# Patient Record
Sex: Female | Born: 1935 | Race: Black or African American | Hispanic: No | Smoking: Never smoker
Health system: Southern US, Community
[De-identification: ages and names within clinical notes are randomized; demographics above are authoritative.]

## PROBLEM LIST (undated history)

## (undated) DIAGNOSIS — L97509 Non-pressure chronic ulcer of other part of unspecified foot with unspecified severity: Secondary | ICD-10-CM

## (undated) DIAGNOSIS — I10 Essential (primary) hypertension: Secondary | ICD-10-CM

## (undated) DIAGNOSIS — M069 Rheumatoid arthritis, unspecified: Secondary | ICD-10-CM

## (undated) DIAGNOSIS — E039 Hypothyroidism, unspecified: Secondary | ICD-10-CM

## (undated) DIAGNOSIS — M810 Age-related osteoporosis without current pathological fracture: Secondary | ICD-10-CM

## (undated) DIAGNOSIS — N189 Chronic kidney disease, unspecified: Principal | ICD-10-CM

## (undated) DIAGNOSIS — R011 Cardiac murmur, unspecified: Secondary | ICD-10-CM

## (undated) DIAGNOSIS — Z9289 Personal history of other medical treatment: Secondary | ICD-10-CM

## (undated) DIAGNOSIS — Z960 Presence of urogenital implants: Secondary | ICD-10-CM

## (undated) DIAGNOSIS — N186 End stage renal disease: Secondary | ICD-10-CM

## (undated) DIAGNOSIS — E119 Type 2 diabetes mellitus without complications: Secondary | ICD-10-CM

## (undated) DIAGNOSIS — R519 Headache, unspecified: Secondary | ICD-10-CM

## (undated) DIAGNOSIS — R51 Headache: Secondary | ICD-10-CM

## (undated) DIAGNOSIS — E11621 Type 2 diabetes mellitus with foot ulcer: Secondary | ICD-10-CM

## (undated) DIAGNOSIS — Z992 Dependence on renal dialysis: Secondary | ICD-10-CM

## (undated) DIAGNOSIS — D631 Anemia in chronic kidney disease: Secondary | ICD-10-CM

## (undated) DIAGNOSIS — Z8701 Personal history of pneumonia (recurrent): Secondary | ICD-10-CM

## (undated) HISTORY — DX: Personal history of pneumonia (recurrent): Z87.01

## (undated) HISTORY — DX: Anemia in chronic kidney disease: D63.1

## (undated) HISTORY — DX: End stage renal disease: Z99.2

## (undated) HISTORY — DX: Type 2 diabetes mellitus without complications: E11.9

## (undated) HISTORY — DX: Essential (primary) hypertension: I10

## (undated) HISTORY — PX: CATARACT EXTRACTION W/ INTRAOCULAR LENS  IMPLANT, BILATERAL: SHX1307

## (undated) HISTORY — DX: Chronic kidney disease, unspecified: N18.9

## (undated) HISTORY — PX: COLONOSCOPY: SHX174

## (undated) HISTORY — DX: Hypothyroidism, unspecified: E03.9

## (undated) HISTORY — DX: End stage renal disease: N18.6

---

## 1973-09-10 DIAGNOSIS — Z9289 Personal history of other medical treatment: Secondary | ICD-10-CM

## 1973-09-10 HISTORY — PX: ORIF FEMUR FRACTURE: SHX2119

## 1973-09-10 HISTORY — DX: Personal history of other medical treatment: Z92.89

## 2003-05-19 ENCOUNTER — Encounter: Payer: Self-pay | Admitting: Internal Medicine

## 2003-05-19 ENCOUNTER — Ambulatory Visit (HOSPITAL_COMMUNITY): Admission: RE | Admit: 2003-05-19 | Discharge: 2003-05-19 | Payer: Self-pay | Admitting: Internal Medicine

## 2003-06-03 ENCOUNTER — Encounter: Payer: Self-pay | Admitting: Internal Medicine

## 2003-06-03 ENCOUNTER — Ambulatory Visit (HOSPITAL_COMMUNITY): Admission: RE | Admit: 2003-06-03 | Discharge: 2003-06-03 | Payer: Self-pay | Admitting: Internal Medicine

## 2003-06-30 ENCOUNTER — Encounter: Payer: Self-pay | Admitting: Internal Medicine

## 2003-06-30 ENCOUNTER — Ambulatory Visit (HOSPITAL_COMMUNITY): Admission: RE | Admit: 2003-06-30 | Discharge: 2003-06-30 | Payer: Self-pay | Admitting: Internal Medicine

## 2003-07-06 ENCOUNTER — Ambulatory Visit (HOSPITAL_COMMUNITY): Admission: RE | Admit: 2003-07-06 | Discharge: 2003-07-06 | Payer: Self-pay | Admitting: Internal Medicine

## 2005-02-06 ENCOUNTER — Ambulatory Visit (HOSPITAL_COMMUNITY): Admission: RE | Admit: 2005-02-06 | Discharge: 2005-02-06 | Payer: Self-pay | Admitting: Nephrology

## 2008-05-03 ENCOUNTER — Ambulatory Visit (HOSPITAL_COMMUNITY): Payer: Self-pay | Admitting: Oncology

## 2008-05-03 ENCOUNTER — Encounter (HOSPITAL_COMMUNITY): Admission: RE | Admit: 2008-05-03 | Discharge: 2008-06-02 | Payer: Self-pay | Admitting: Oncology

## 2008-09-16 ENCOUNTER — Encounter: Payer: Self-pay | Admitting: Internal Medicine

## 2008-10-04 ENCOUNTER — Encounter (HOSPITAL_COMMUNITY): Admission: RE | Admit: 2008-10-04 | Discharge: 2009-01-02 | Payer: Self-pay | Admitting: Nephrology

## 2008-10-20 ENCOUNTER — Encounter: Admission: RE | Admit: 2008-10-20 | Discharge: 2008-10-20 | Payer: Self-pay | Admitting: Nephrology

## 2008-11-13 ENCOUNTER — Ambulatory Visit: Payer: Self-pay | Admitting: Cardiology

## 2008-11-14 ENCOUNTER — Inpatient Hospital Stay (HOSPITAL_COMMUNITY): Admission: EM | Admit: 2008-11-14 | Discharge: 2008-11-20 | Payer: Self-pay | Admitting: Emergency Medicine

## 2008-11-15 ENCOUNTER — Ambulatory Visit: Payer: Self-pay | Admitting: Internal Medicine

## 2008-11-16 ENCOUNTER — Encounter: Payer: Self-pay | Admitting: Cardiology

## 2008-11-17 ENCOUNTER — Ambulatory Visit: Payer: Self-pay | Admitting: Internal Medicine

## 2008-11-24 ENCOUNTER — Encounter: Payer: Self-pay | Admitting: Internal Medicine

## 2008-11-29 ENCOUNTER — Ambulatory Visit: Payer: Self-pay | Admitting: Physician Assistant

## 2008-12-30 ENCOUNTER — Ambulatory Visit: Payer: Self-pay | Admitting: Cardiology

## 2008-12-31 ENCOUNTER — Encounter (INDEPENDENT_AMBULATORY_CARE_PROVIDER_SITE_OTHER): Payer: Self-pay | Admitting: *Deleted

## 2008-12-31 LAB — CONVERTED CEMR LAB
CO2: 22 meq/L
Chloride: 105 meq/L
HDL: 30 mg/dL
LDL Cholesterol: 116 mg/dL
Sodium: 140 meq/L
Triglycerides: 112 mg/dL

## 2009-01-10 ENCOUNTER — Encounter (HOSPITAL_COMMUNITY): Admission: RE | Admit: 2009-01-10 | Discharge: 2009-04-10 | Payer: Self-pay | Admitting: Nephrology

## 2009-02-11 ENCOUNTER — Encounter: Payer: Self-pay | Admitting: Cardiology

## 2009-02-11 LAB — CONVERTED CEMR LAB
Albumin: 4 g/dL (ref 3.5–5.2)
CO2: 18 meq/L — ABNORMAL LOW (ref 19–32)
Calcium: 8.3 mg/dL — ABNORMAL LOW (ref 8.4–10.5)
Chloride: 114 meq/L — ABNORMAL HIGH (ref 96–112)
Glucose, Bld: 77 mg/dL (ref 70–99)
Potassium: 5.3 meq/L (ref 3.5–5.3)
Total Bilirubin: 0.4 mg/dL (ref 0.3–1.2)
Total Protein: 7.6 g/dL (ref 6.0–8.3)

## 2009-03-11 DIAGNOSIS — E119 Type 2 diabetes mellitus without complications: Secondary | ICD-10-CM | POA: Insufficient documentation

## 2009-03-11 DIAGNOSIS — E663 Overweight: Secondary | ICD-10-CM | POA: Insufficient documentation

## 2009-04-04 ENCOUNTER — Encounter (INDEPENDENT_AMBULATORY_CARE_PROVIDER_SITE_OTHER): Payer: Self-pay

## 2009-04-04 LAB — CONVERTED CEMR LAB
HCT: 30.8 %
Hemoglobin: 10 g/dL
Platelets: 149 10*3/uL
WBC: 9.6 10*3/uL

## 2009-04-12 ENCOUNTER — Encounter (INDEPENDENT_AMBULATORY_CARE_PROVIDER_SITE_OTHER): Payer: Self-pay

## 2009-04-12 LAB — CONVERTED CEMR LAB: TIBC: 183 ug/dL

## 2009-04-26 ENCOUNTER — Encounter (HOSPITAL_COMMUNITY): Admission: RE | Admit: 2009-04-26 | Discharge: 2009-07-25 | Payer: Self-pay | Admitting: Nephrology

## 2009-05-11 ENCOUNTER — Ambulatory Visit: Payer: Self-pay | Admitting: Cardiology

## 2009-05-11 ENCOUNTER — Ambulatory Visit (HOSPITAL_COMMUNITY): Admission: RE | Admit: 2009-05-11 | Discharge: 2009-05-11 | Payer: Self-pay | Admitting: Family Medicine

## 2009-05-11 DIAGNOSIS — R0989 Other specified symptoms and signs involving the circulatory and respiratory systems: Secondary | ICD-10-CM

## 2009-05-17 ENCOUNTER — Ambulatory Visit (HOSPITAL_COMMUNITY): Admission: RE | Admit: 2009-05-17 | Discharge: 2009-05-17 | Payer: Self-pay | Admitting: Cardiology

## 2009-05-18 ENCOUNTER — Encounter: Payer: Self-pay | Admitting: Cardiology

## 2009-06-16 ENCOUNTER — Ambulatory Visit (HOSPITAL_COMMUNITY): Admission: RE | Admit: 2009-06-16 | Discharge: 2009-06-16 | Payer: Self-pay | Admitting: Orthopedic Surgery

## 2009-07-18 ENCOUNTER — Encounter (HOSPITAL_COMMUNITY): Admission: RE | Admit: 2009-07-18 | Discharge: 2009-07-19 | Payer: Self-pay | Admitting: Nephrology

## 2009-09-26 ENCOUNTER — Encounter (HOSPITAL_COMMUNITY): Admission: RE | Admit: 2009-09-26 | Discharge: 2009-12-25 | Payer: Self-pay | Admitting: Nephrology

## 2009-11-11 ENCOUNTER — Encounter (INDEPENDENT_AMBULATORY_CARE_PROVIDER_SITE_OTHER): Payer: Self-pay

## 2009-11-26 LAB — CONVERTED CEMR LAB
Calcium: 8.8 mg/dL
Chloride: 105 meq/L
Creatinine, Ser: 2.14 mg/dL
Potassium: 5.5 meq/L

## 2009-12-20 ENCOUNTER — Encounter (INDEPENDENT_AMBULATORY_CARE_PROVIDER_SITE_OTHER): Payer: Self-pay | Admitting: *Deleted

## 2009-12-21 ENCOUNTER — Ambulatory Visit: Payer: Self-pay | Admitting: Cardiology

## 2010-01-30 ENCOUNTER — Encounter: Payer: Self-pay | Admitting: Internal Medicine

## 2010-02-14 ENCOUNTER — Encounter: Payer: Self-pay | Admitting: Internal Medicine

## 2010-03-02 ENCOUNTER — Encounter: Payer: Self-pay | Admitting: Internal Medicine

## 2010-03-16 ENCOUNTER — Encounter (INDEPENDENT_AMBULATORY_CARE_PROVIDER_SITE_OTHER): Payer: Self-pay | Admitting: *Deleted

## 2010-03-16 ENCOUNTER — Encounter: Payer: Self-pay | Admitting: Internal Medicine

## 2010-03-16 LAB — CONVERTED CEMR LAB: Iron: 222 ug/dL

## 2010-03-27 ENCOUNTER — Ambulatory Visit: Payer: Self-pay | Admitting: Internal Medicine

## 2010-05-23 ENCOUNTER — Ambulatory Visit (HOSPITAL_COMMUNITY): Admission: RE | Admit: 2010-05-23 | Discharge: 2010-05-23 | Payer: Self-pay | Admitting: Family Medicine

## 2010-07-31 ENCOUNTER — Encounter: Payer: Self-pay | Admitting: Internal Medicine

## 2010-08-25 ENCOUNTER — Ambulatory Visit (HOSPITAL_COMMUNITY)
Admission: RE | Admit: 2010-08-25 | Discharge: 2010-08-25 | Payer: Self-pay | Source: Home / Self Care | Attending: Neurology | Admitting: Neurology

## 2010-09-12 LAB — CONVERTED CEMR LAB
Albumin: 4 g/dL
BUN: 70 mg/dL
CO2: 19 meq/L
Chloride: 108 meq/L
Ferritin: 723 ng/mL
GFR calc non Af Amer: 20 mL/min
Glomerular Filtration Rate, Af Am: 23 mL/min/{1.73_m2}
Glucose, Bld: 90 mg/dL
Sodium: 142 meq/L

## 2010-09-22 ENCOUNTER — Encounter (INDEPENDENT_AMBULATORY_CARE_PROVIDER_SITE_OTHER): Payer: Self-pay | Admitting: *Deleted

## 2010-09-26 ENCOUNTER — Ambulatory Visit
Admission: RE | Admit: 2010-09-26 | Discharge: 2010-09-26 | Payer: Self-pay | Source: Home / Self Care | Attending: Cardiology | Admitting: Cardiology

## 2010-10-10 NOTE — Op Note (Signed)
Summary: EGD: Dr. Jena Gauss: Normal    NAMEAUTYM, [name]                         ACCOUNT NO.:  [id number]   MEDICAL RECORD NO.:  [id number]                   PATIENT TYPE:  AMB   LOCATION:  DAY                                  FACILITY:  [facility]   PHYSICIAN:  R. Roetta Sessions, M.D.              DATE OF BIRTH:  Dec 03, 1931   DATE OF PROCEDURE:  06/30/2003  DATE OF DISCHARGE:                                 OPERATIVE REPORT   PROCEDURE:  Diagnostic esophagogastroduodenoscopy.   INDICATIONS FOR PROCEDURE:  The patient is a 77 year old lady with a history  of chronic anemia and Hemoccult-positive stool.  Prior colonoscopy and small-  bowel follow-through were negative.  EGD is now being done to rule out a  lesion in her upper GI tract.  This approach has been discussed with the  patient at length previously.  The potential risks, benefits, and  alternatives have been reviewed.  Please see my 06/22/2003 H&P for more  information.   PROCEDURE:  O2 saturation, blood pressure, pulses, and respirations were  monitored throughout the entirety of the procedure.  Conscious sedation was  with Versed 3 mg IV, Demerol 50 mg IV in divided doses.  The instrument used  was the Olympus video chip adult gastroscope.   FINDINGS:  Examination of the tubular esophagus revealed no mucosal  abnormalities.  The EG junction was easily traversed.   Stomach:  The gastric cavity was empty and insufflated well with air.  Thorough examination of the gastric mucosa including retroflex view in the  proximal stomach and esophagogastric junction was undertaken.  The patient  was noted to have a J-shaped stomach.  Otherwise, the gastric mucosa  appeared normal.  The pylorus was patent and easily traversed.   Duodenum:  Examination of the bulb and second portion revealed no  abnormalities.   THERAPEUTIC/DIAGNOSTIC MANEUVERS PERFORMED:  None.   The patient tolerated the procedure well and was reactive in  endoscopy.   IMPRESSION:  1. Normal esophagus.  2. J-shaped but otherwise normal-appearing stomach.  3. Normal first and second portions of the duodenum.    RECOMMENDATIONS:  1. Will arrange a Given imaging capsule study down at [facility].  2. Further recommendations to follow.      ___________________________________________                                            Jonathon Bellows, M.D.   RMR/MEDQ  D:  06/30/2003  T:  06/30/2003  Job:  [id number]   cc:   [name], M.D.  [address]  [place]  [place] [address]  Fax: [phone]

## 2010-10-10 NOTE — Assessment & Plan Note (Signed)
Summary: 6 mth f/u per checkout on 05/11/09/tg   Visit Type:  Follow-up Referring Provider:  Renal-Dr. Hyman Hopes Primary Provider:  Dr. Karleen Hampshire   History of Present Illness: [name] returns to the office is scheduled for continued assessment and treatment of diastolic congestive heart failure, hypertension and chronic kidney disease.  Since her last visit, she developed severe pedal edema with skin ulceration prompting referral to a wound Center in [place].  With therapy and increased diuretic, her skin has healed entirely.  She continues to receive an ESA at Dr. Marland Mcalpine office.  Hemoglobin is reportedly close to 12.  Chronic kidney disease has been relatively stable with creatinine close to 2.   She is following a renal diet.  -  Date:  11/26/2009    BG Random: 122    BUN: 44    Creatinine: 2.14    Sodium: 140    Potassium: 5.5    Chloride: 105    CO2 Total: 22    Calcium: 8.8   Current Medications (verified): 1)  Darvocet-N 100 100-650 Mg Tabs (Propoxyphene N-Apap) .... As Needed 2)  Glucotrol 5 Mg Tabs (Glipizide) .... Take 1 Tablet By Mouth Once A Day 3)  Levothroid 100 Mcg Tabs (Levothyroxine Sodium) .... Take 1 Tablet By Mouth Once A Day 4)  Torsemide 20 Mg Tabs (Torsemide) .... Take One To Two  Tablets By Mouth Daily As Needed For Edema 5)  Diprolene Af 0.05 % Crea (Betamethasone Dipropionate Aug) .... Two Times A Day 6)  Procrit 04540 Unit/ml Soln (Epoetin Alfa) .... Subcutaneously Every Two Weeks 7)  Nu-Iron 150 Mg Caps (Polysaccharide Iron Complex) .... Take 1 Cap Daily 8)  Ra Fish Oil 1000 Mg Caps (Omega-3 Fatty Acids) .... Take 1 Cap Daily 9)  Alprazolam 0.5 Mg Tabs (Alprazolam) .... Take 1 Tab Q 6hrs 10)  Amlodipine Besylate 10 Mg Tabs (Amlodipine Besylate) .... Take 1 Tab Daily  Allergies (verified): 1)  ! * Ivp Dye  Past History:  PMH, FH, and Social History reviewed and updated.  Review of Systems       See history of present illness.  Vital  Signs:  Patient profile:   77 year old female Weight:      226 pounds Pulse rate:   76 / minute BP sitting:   132 / 68  (right arm)  Vitals Entered By: Dreama Saa, CNA (December 21, 2009 2:59 PM)  Physical Exam  General:    Overweight; well-developed; no acute distress;    Neck: No JVD; no right carotid bruits;prominent carotid pulsations with left bruit  Lungs: No tachypnea, clear without rales, rhonchi or wheezes; decreased breath sounds at both bases Cardiovascular: normal PMI; normal S1; prominent splitting of the second heart sound; modest systolic ejection murmur.  Abdomen: BS normal; soft and non-tender without masses or organomegaly;  Musculoskeletal: No deformities, no cyanosis or clubbing; surgical scar over the righ foot/ankle   Neurologic: Normal cranial nerves; symmetric strength and tone;  Skin:  Warm, no significant lesions;  Extremities: 1-2+ pitting ankle edema; compression stockings in place     Impression & Recommendations:  Problem # 1:  CAROTID BRUIT, LEFT (ICD-785.9) Carotid ultrasound shows trivial atherosclerosis.  Management of vascular risk factors should be all that is required for this issue.  Problem # 2:  CONGESTIVE HEART FAILURE, UNSPECIFIED (ICD-428.0) She remains compensated on an increased dose of diuretic.  She is concerned about her dry skin, but I emphasized her weight loss and improved lower  extremity edema.  I would continue to encourage her to take her diuretic on a daily basis and simply to modify the dosage based upon the magnitude of edema.  Problem # 3:  DIABETES MELLITUS (ICD-250.00) Patient reports CBGs around 110.  I have no recent hemoglobin A1c levels.  Problem # 4:  RENAL DISEASE, CHRONIC (ICD-593.9) Basically stable with only a slight increase in creatinine over the past year or so.  She is to see Dr. Hyman Hopes for reevaluation in the near future.  I will see this nice woman again in 9 months.  Patient Instructions: 1)  Your  physician recommends that you schedule a follow-up appointment in: 9 months 2)  Your physician has requested that you decrease the amount of potassium in your diet. Please see [facility] handout.

## 2010-10-10 NOTE — Miscellaneous (Signed)
Summary: LABS BMP,LIPID,12/31/2008  Clinical Lists Changes  Observations: Added new observation of CALCIUM: 8.8 mg/dL (37/62/8315 17:61) Added new observation of CREATININE: 2.14 mg/dL (60/73/7106 26:94) Added new observation of BUN: 44 mg/dL (85/46/2703 50:09) Added new observation of BG RANDOM: 122 mg/dL (38/18/2993 71:69) Added new observation of CO2 PLSM/SER: 22 meq/L (12/31/2008 10:51) Added new observation of CL SERUM: 105 meq/L (12/31/2008 10:51) Added new observation of K SERUM: 5.5 meq/L (12/31/2008 10:51) Added new observation of NA: 140 meq/L (12/31/2008 10:51) Added new observation of LDL: 116 mg/dL (67/89/3810 17:51) Added new observation of HDL: 30 mg/dL (02/58/5277 82:42) Added new observation of TRIGLYC TOT: 112 mg/dL (35/36/1443 15:40) Added new observation of CHOLESTEROL: 168 mg/dL (08/67/6195 09:32)

## 2010-10-10 NOTE — Miscellaneous (Signed)
**Note De-Identified [name] Obfuscation** Summary: Torsemide dose change  Clinical Lists Changes  Medications: Changed medication from TORSEMIDE 20 MG TABS (TORSEMIDE) Take one half tablet by mouth daily. to TORSEMIDE 20 MG TABS (TORSEMIDE) Take one to two  tablets by mouth daily as needed for edema    Pt's PCP, Dr. Regino Schultze, increased dose of Torsemide to 20mg  1 to 2 tabs by mouth daily as needed for edema on 11-10-2009.

## 2010-10-10 NOTE — Assessment & Plan Note (Signed)
Summary: WEIGHT LOSS/YF   History of Present Illness Visit Type: Initial Consult Primary GI MD: Stan Head MD [facility] Primary Provider: Dr. Karleen Hampshire Requesting Provider: Elvis Coil, MD Chief Complaint: Unintentional weight loss over the last year pt states she has lost 50 lbs but is not really sure. Pt denies any loss of appetite or and GI sx.  History of Present Illness:   77 yo woman that has been losing weight. Dr. Marland Mcalpine notes indicate weight decrease of about40# from December to May 2011. 250# down to 212#. She admitted to poor appetite she relates to stress of daughters (2) with strokes) and a schizophrenic and terminally ill son that lives with her.She has also been on a diuretic regimen due to severe pedal edemaand Dr. Marvel Plan notes reflect this. She believes loss of fluid from this is main cause of weight loss. She has no specific GI complaints today.   GI Review of Systems    Reports weight loss.   Weight loss of ? pounds   Denies abdominal pain, acid reflux, belching, bloating, chest pain, dysphagia with liquids, dysphagia with solids, heartburn, loss of appetite, nausea, vomiting, vomiting blood, and  weight gain.        Denies anal fissure, black tarry stools, change in bowel habit, constipation, diarrhea, diverticulosis, fecal incontinence, heme positive stool, hemorrhoids, irritable bowel syndrome, jaundice, light color stool, liver problems, rectal bleeding, and  rectal pain.    Current Medications (verified): 1)  Glipizide 2.5 Mg Xr24h-Tab (Glipizide) .... One Tablet By Mouth Once Daily 2)  Levothroid 100 Mcg Tabs (Levothyroxine Sodium) .... Take 1 Tablet By Mouth Once A Day 3)  Torsemide 20 Mg Tabs (Torsemide) .... Take One To Two  Tablets By Mouth Daily As Needed For Edema 4)  Ra Fish Oil 1000 Mg Caps (Omega-3 Fatty Acids) .... Take 1 Cap Daily 5)  Alprazolam 0.5 Mg Tabs (Alprazolam) .... Take 1 Tab Q 6hrs 6)  Amlodipine Besylate 10 Mg Tabs (Amlodipine  Besylate) .... Take 1 Tab Daily  Allergies (verified): 1)  ! * Ivp Dye  Past History:  Past Medical History: Reviewed history from 05/11/2009 and no changes required. CONGESTIVE HEART FAILURE, preserved left ventricular systolic function DIABETES MELLITUS (ICD-250.00) RENAL DISEASE, CHRONIC (ICD-593.9) PULMONARY EDEMA (ICD-514) HYPERTENSION, UNSPECIFIED (ICD-401.9) OVERWEIGHT/OBESITY (ICD-278.02) Anemia  Past Surgical History: Reviewed history from 05/11/2009 and no changes required. Surgery to the eyes ORIF- left leg secondary to trauma in the 1970s   Family History: Reviewed history from 03/11/2009 and no changes required. Family History of CVA or Stroke:  Heart disease Kidney disease  Social History: Reviewed history from 03/11/2009 and no changes required. Retired  - [employer] Divorced  Tobacco Use - No.  Alcohol Use - no Regular Exercise - no Drug Use - no  Review of Systems       All other ROS negative except as per HPI.   Vital Signs:  Patient profile:   77 year old female Height:      66 inches Weight:      211 pounds BMI:     34.18 Pulse rate:   86 / minute Pulse rhythm:   regular BP sitting:   128 / 78  (left arm) Cuff size:   large  Vitals Entered By: Christie Nottingham CMA Duncan Dull) (March 27, 2010 10:07 AM)  Physical Exam  General:  obese.  NAD Eyes:  icteric Mouth:  dentures no lesions Neck:  Supple; no masses or thyromegaly. Lungs:  Clear throughout to auscultation.  Heart:  2/6 RUSB murmur no gallops Abdomen:  obese, soft and non-tender BS+ no masses Extremities:  1+ bilateral LE edema to mid pre-tbal Neurologic:  Alert and  oriented x3 Psych:  Alert and cooperative. Normal mood and affect.   Records from Dr. Dietrich Pates and Hyman Hopes reviewed, labs and xrays and old endoscopies reviewed  Impression & Recommendations:  Problem # 1:  WEIGHT LOSS (ICD-783.21) Assessment New She and I believe this is due to diuresis. Her weight is the ame  now as it was in May. She has had a colonoscopy and egd in 2004. Do not see an indication at this time to redo those. Iron studies are ok.  Patient Instructions: 1)  Please schedule a follow-up appointment as needed.  2)  Copy sent to : Elvis Coil, MD, Karleen Hampshire, MD 3)  The medication list was reviewed and reconciled.  All changed / newly prescribed medications were explained.  A complete medication list was provided to the patient / caregiver.

## 2010-10-10 NOTE — Letter (Signed)
Summary: [employer] PATIENT CHART REQUEST  [employer] PATIENT CHART REQUEST   Imported By: Rexene Alberts 07/31/2010 15:54:46  _____________________________________________________________________  External Attachment:    Type:   Image     Comment:   External Document

## 2010-10-10 NOTE — Op Note (Signed)
Summary: Colonoscopy: Dr. Jena Gauss: Diverticulosis    NAME:  [name], [name]                         ACCOUNT NO.:  [id number]   MEDICAL RECORD NO.:  [id number]                   PATIENT TYPE:  AMB   LOCATION:  DAY                                  FACILITY:  [facility]   PHYSICIAN:  R. Roetta Sessions, M.D.              DATE OF BIRTH:  [birth year]   DATE OF PROCEDURE:  05/19/2003  DATE OF DISCHARGE:                                 OPERATIVE REPORT   PROCEDURE:  Diagnostic colonoscopy.   INDICATION FOR PROCEDURE:  The patient is a 77 year old lady with chronic  anemia.  She has not had any GI bleeding clinically.  In fact, her Hemoccult  status is unknown.  She has had a normal MCV.  She remains anemic with a  hemoglobin and hematocrit of 9.8 and 29.5 today.   This lady is devoid of any GI tract symptoms, no odynophagia, dysphagia,  early satiety, reflux symptoms, nausea, vomiting, abdominal pain, change in  bowel habits, or weight loss.  She has never had her lower GI tract  evaluated.  Colonoscopy is now being done.  This approach has been discussed  with the patient previously.  The potential risks, benefits, and  alternatives have been reviewed, questions answered, and she is agreeable.  Please see the documentation in the medical record with the dictated  consultation of Jan 28, 2003.   PROCEDURE NOTE:  O2 saturation, blood pressure, pulse, and respirations were  monitored throughout the entire procedure.   CONSCIOUS SEDATION:  Versed 3 mg IV, Demerol 50 mg IV in divided doses.   INSTRUMENT USED:  Olympus video chip adult colonoscope.   FINDINGS:  Digital rectal exam revealed no abnormalities.   Endoscopic findings:  Unfortunately, the prep was marginal to poor.   Rectum:  Examination of the rectal mucosa including retroflexed view of the  anal verge revealed no abnormalities.   Colon:  The colonic mucosa was surveyed from the rectosigmoid junction  through the left, transverse,  and right colon, to the area of the  appendiceal orifice, ileocecal valve, and cecum.  These structures were well-  seen and photographed for the record.  The patient's colon was elongated and  tortuous.  It took a number of maneuvers including external abdominal  pressure and changing of the patient's position to reach the cecum.  There  was also quite a bit of liquid stool throughout her colon, which was  somewhat viscous and loaded with seeds.  This caused clogging of the scope  on several occasions requiring scope maintenance during the procedure.  The  patient was noted to have scattered left-sided diverticula.  The remainder  of the colonic mucosa and the cecum appeared normal.  From this level the  scope was slowly withdrawn and all previously-mentioned mucosal surfaces  were again seen, and no additional abnormalities were observed.  The patient  tolerated the procedure well and  was reacted in endoscopy.   IMPRESSION:  1. Normal rectum.  2. Left-sided diverticula.  3. Long, redundant, capacious colon.  4. Marginal to poor prep made the exam more difficult.  5. No gross colonic mucosal abnormalities seen today.   At this point in time the patient has a chronic anemia.  She has not been  documented to have any evidence of gastrointestinal bleeding as far as I am  aware at this time.  In fact, she has not been documented to be iron-  deficient at this time.   RECOMMENDATIONS:  1. A fasting iron, iron-binding capacity, and ferritin today.  2. Will have her return three Hemoccult cards next week.   Will make further recommendations after the above-mentioned studies are  available for review.                                               Jonathon Bellows, M.D.    RMR/MEDQ  D:  05/19/2003  T:  05/19/2003  Job:  [id number]   cc:   Corrie Mckusick, M.D.  [address] [place] [place]  [place]  [place] [address]  Fax: [phone]

## 2010-10-10 NOTE — Letter (Signed)
Summary: [facility]  [facility]   Imported By: Lennie Odor 03/31/2010 14:44:22  _____________________________________________________________________  External Attachment:    Type:   Image     Comment:   External Document

## 2010-10-12 NOTE — Assessment & Plan Note (Signed)
Summary: F9M   Visit Type:  Follow-up Referring Provider:  Elvis Coil, MD Primary Provider:  Dr. Karleen Hampshire   History of Present Illness: [name] returns to the office for continued assessment and treatment of a history of congestive heart failure with preserved left ventricular systolic function, hypertension and chronic kidney disease-stage IV.  Since her last visit, she has done quite well.  She feels fine from day to day with a sedentary lifestyle, but no dyspnea nor chest discomfort when walking with a cane.  She is receiving q.2 week injections of Procrit, apparently with benefit.  Blood pressure control has been good as far as she knows and renal function stable.  Current Medications (verified): 1)  Glipizide 2.5 Mg Xr24h-Tab (Glipizide) .... One Tablet By Mouth Once Daily 2)  Levothroid 100 Mcg Tabs (Levothyroxine Sodium) .... Take 1 Tablet By Mouth Once A Day 3)  Torsemide 20 Mg Tabs (Torsemide) .... Take One To Two  Tablets By Mouth Daily As Needed For Edema 4)  Ra Fish Oil 1000 Mg Caps (Omega-3 Fatty Acids) .... Take 1 Cap Daily 5)  Alprazolam 0.5 Mg Tabs (Alprazolam) .... Take 1 Tab Q 6hrs 6)  Amlodipine Besylate 10 Mg Tabs (Amlodipine Besylate) .... Take 1 Tab Daily 7)  Calcitriol 0.25 Mcg Caps (Calcitriol) .... Take 1 Cap M,wed,friday  Allergies: 1)  ! * Ivp Dye  Comments:  Nurse/Medical Assistant: patient uses [facility] brought meds   Past History:  PMH, FH, and Social History reviewed and updated.  Past Medical History: CONGESTIVE HEART FAILURE, preserved left ventricular systolic function DIABETES MELLITUS RENAL DISEASE, CHRONIC-stage IV; creatinine of 2.1 in 2011 HYPERTENSION OVERWEIGHT/OBESITY (ICD-278.02) Anemia-ESA therapy  Past Surgical History: Ophthalmologic surgery ORIF- left leg secondary to trauma in the 1970s   Review of Systems  The patient denies chest pain, syncope, dyspnea on exertion, and peripheral edema.     Vital Signs:  Patient profile:   77 year old female Weight:      229 pounds BMI:     37.10 O2 Sat:      97 % on Room air Pulse rate:   71 / minute BP sitting:   146 / 65  (left arm)  Vitals Entered By: Dreama Saa, CNA (September 26, 2010 2:28 PM)  O2 Flow:  Room air  Physical Exam  General:    Overweight; well-developed; no acute distress;    Neck: No JVD Lungs: No tachypnea, clear without rales, rhonchi or wheezes; decreased breath sounds at both bases Cardiovascular: normal PMI; normal S1; prominent splitting of the second heart sound; minimal systolic ejection murmur.  Abdomen: BS normal; soft and non-tender without masses or organomegaly;  Musculoskeletal: No deformities, no cyanosis or clubbing; surgical scar over the righ foot/ankle   Neurologic: Normal cranial nerves; symmetric strength and tone;  Skin:  Warm, no significant lesions;  Extremities: 1-2+ pitting ankle edema; compression stockings in place     Impression & Recommendations:  Problem # 1:  CONGESTIVE HEART FAILURE, UNSPECIFIED (ICD-428.0) Control of CHF is good with diuretics alone.  She has gained 18 pounds, but is asymptomatic without findings for CHF on exam.  We will take today's value as her new dry weight.  Medications include 20 mg of torsemide per day unless she feels bloated when she takes 40 mg.  I explained to her that a 3 pound increase in weight or increase in pedal edema would be a better marker for taking the higher dose of diuretic.  Problem #  2:  RENAL DISEASE, CHRONIC (ICD-593.9) We will seek recent laboratory values from [facility].  Problem # 3:  HYPERTENSION (ICD-401.9) Blood pressure control is fairly good.  Her nephrologist monitors blood pressure q.2 weeks and can modify her antihypertensive regimen as necessary.  BP today: 146/65 Prior BP: 128/78 (03/27/2010)  Labs Reviewed: K+: 5.5 (11/26/2009) Creat: : 2.14 (11/26/2009)   Patient Instructions: 1)  Your physician  recommends that you weigh, daily, at the same time every day, and in the same amount of clothing.  Please record your daily weights on the handout provided and bring it to your next appointment. 2)   Please weigh yourself today (this will be your dry weight) if you gain 3 pounds take 2 tablets of Furosemide until weight is less than dry weight 3)  Your physician recommends that you schedule a follow-up appointment in: 1 month with nurse and in 9 months

## 2010-10-12 NOTE — Miscellaneous (Signed)
Summary: labs 03/16/2010 iron   Clinical Lists Changes  Observations: Added new observation of FERRITIN: 810 ng/mL (03/16/2010 11:43) Added new observation of IRON SATUR %: 47 % (03/16/2010 11:43) Added new observation of UIBC: 117 mcg/dL (46/96/2952 84:13) Added new observation of IRON: 222 mcg/dL (24/40/1027 25:36)

## 2010-10-25 ENCOUNTER — Encounter (INDEPENDENT_AMBULATORY_CARE_PROVIDER_SITE_OTHER): Payer: Self-pay | Admitting: *Deleted

## 2010-10-25 LAB — CONVERTED CEMR LAB
BUN: 79 mg/dL
Chloride: 107 meq/L
Creatinine, Ser: 2.03 mg/dL
Glucose, Bld: 140 mg/dL
Iron: 88 ug/dL
Potassium: 4.7 meq/L
TIBC: 209 ug/dL

## 2010-10-26 ENCOUNTER — Ambulatory Visit (INDEPENDENT_AMBULATORY_CARE_PROVIDER_SITE_OTHER): Payer: Medicaid Other

## 2010-10-26 ENCOUNTER — Encounter (INDEPENDENT_AMBULATORY_CARE_PROVIDER_SITE_OTHER): Payer: Self-pay | Admitting: *Deleted

## 2010-10-26 ENCOUNTER — Encounter: Payer: Self-pay | Admitting: Cardiology

## 2010-10-26 DIAGNOSIS — I10 Essential (primary) hypertension: Secondary | ICD-10-CM

## 2010-10-26 DIAGNOSIS — I509 Heart failure, unspecified: Secondary | ICD-10-CM

## 2010-11-01 NOTE — Assessment & Plan Note (Signed)
Summary: NURSE VISIT WEIGHT CHECK PER PT CHECK OUT 1.17/TMJ/JML   Visit Type:  1 month nurse visit Referring Provider:  Maurine Simmering, MD Primary Provider:  Dr. Karleen Hampshire   History of Present Illness: S: 1 month follow up B: office visit on 09/26/10, daily wts with furosemide as needed for wt > 229 A: pt denies c/o,  wt 229 lbs  no gain since last ov, wt diary returned R: pt requested wt diary be returned to continue daily wts., asked pt to continue her current med regimen and wt management plan  10/26/10  I see no data from [facility] Appears to be doing well-continue current Rx.  Elgin Bing, M.D.   Current Medications (verified): 1)  Glipizide 2.5 Mg Xr24h-Tab (Glipizide) .... One Tablet By Mouth Once Daily 2)  Levothroid 100 Mcg Tabs (Levothyroxine Sodium) .... Take 1 Tablet By Mouth Once A Day 3)  Torsemide 20 Mg Tabs (Torsemide) .... Take One To Two  Tablets By Mouth Daily As Needed For Edema 4)  Ra Fish Oil 1000 Mg Caps (Omega-3 Fatty Acids) .... Take 1 Cap Daily 5)  Alprazolam 0.5 Mg Tabs (Alprazolam) .... Take 1 Tab Q 6hrs 6)  Amlodipine Besylate 10 Mg Tabs (Amlodipine Besylate) .... Take 1 Tab Daily 7)  Calcitriol 0.25 Mcg Caps (Calcitriol) .... Take 1 Cap M,wed,friday 8)  Vitamin D3 50000 Unit Caps (Cholecalciferol) .... Take 1 Tablet By Mouth Weekly 9)  Folic Acid 1 Mg Tabs (Folic Acid) .... Take 1 Tablet By Mouth Once A Day  Allergies (verified): 1)  ! * Ivp Dye  Vital Signs:  Patient profile:   77 year old female Height:      66 inches Weight:      229 pounds O2 Sat:      97 % on Room air Pulse rate:   73 / minute BP sitting:   136 / 64  (left arm)  Vitals Entered By: Teressa Lower RN (October 26, 2010 10:04 AM)  O2 Flow:  Room air  Spoke with pt no new changes in med list

## 2010-11-01 NOTE — Letter (Signed)
Summary: BP LOG  BP LOG   Imported By: Faythe Ghee 10/26/2010 10:27:49  _____________________________________________________________________  External Attachment:    Type:   Image     Comment:   External Document

## 2010-11-07 NOTE — Miscellaneous (Signed)
Summary: cmp,fe,tibc,ferritin  Clinical Lists Changes  Observations: Added new observation of ALBUMIN: 3.9 g/dL (16/06/9603 54:09) Added new observation of GFR AA: 27 mL/min/1.66m2 (10/25/2010 14:01) Added new observation of GFR: 23 mL/min (10/25/2010 14:01) Added new observation of CREATININE: 2.03 mg/dL (81/19/1478 29:56) Added new observation of BUN: 79 mg/dL (21/30/8657 84:69) Added new observation of BG RANDOM: 140 mg/dL (62/95/2841 32:44) Added new observation of CO2 PLSM/SER: 107 meq/L (10/25/2010 14:01) Added new observation of CL SERUM: 107 meq/L (10/25/2010 14:01) Added new observation of K SERUM: 4.7 meq/L (10/25/2010 14:01) Added new observation of NA: 140 meq/L (10/25/2010 14:01) Added new observation of FERRITIN: 731 ng/mL (10/25/2010 14:01) Added new observation of IRON SATUR %: 42 % (10/25/2010 14:01) Added new observation of TIBC: 209 mcg/dL (09/12/7251 66:44) Added new observation of UIBC: 121 mcg/dL (03/47/4259 56:38) Added new observation of IRON: 88 mcg/dL (75/64/3329 51:88)

## 2010-12-18 LAB — IRON AND TIBC
Saturation Ratios: 23 % (ref 20–55)
UIBC: 162 ug/dL

## 2010-12-18 LAB — POCT HEMOGLOBIN-HEMACUE: Hemoglobin: 9 g/dL — ABNORMAL LOW (ref 12.0–15.0)

## 2010-12-18 LAB — RENAL FUNCTION PANEL
Albumin: 3.4 g/dL — ABNORMAL LOW (ref 3.5–5.2)
BUN: 38 mg/dL — ABNORMAL HIGH (ref 6–23)
CO2: 22 mEq/L (ref 19–32)
Calcium: 8.6 mg/dL (ref 8.4–10.5)
Chloride: 114 mEq/L — ABNORMAL HIGH (ref 96–112)
Creatinine, Ser: 2.06 mg/dL — ABNORMAL HIGH (ref 0.4–1.2)
GFR calc Af Amer: 30 mL/min — ABNORMAL LOW (ref >60)
GFR calc non Af Amer: 25 mL/min — ABNORMAL LOW (ref >60)
Glucose, Bld: 73 mg/dL (ref 70–99)
Phosphorus: 3.7 mg/dL (ref 2.3–4.6)
Sodium: 144 mEq/L (ref 135–145)

## 2010-12-18 LAB — CBC
Platelets: 133 10*3/uL — ABNORMAL LOW (ref 150–400)
RDW: 19.3 % — ABNORMAL HIGH (ref 11.5–15.5)

## 2010-12-19 LAB — RENAL FUNCTION PANEL
Calcium: 8.9 mg/dL (ref 8.4–10.5)
GFR calc Af Amer: 30 mL/min — ABNORMAL LOW (ref >60)
GFR calc non Af Amer: 25 mL/min — ABNORMAL LOW (ref >60)
Phosphorus: 3.1 mg/dL (ref 2.3–4.6)
Sodium: 142 mEq/L (ref 135–145)

## 2010-12-19 LAB — IRON AND TIBC
Saturation Ratios: 26 % (ref 20–55)
UIBC: 167 ug/dL

## 2010-12-19 LAB — POCT HEMOGLOBIN-HEMACUE: Hemoglobin: 9.8 g/dL — ABNORMAL LOW (ref 12.0–15.0)

## 2010-12-20 LAB — RENAL FUNCTION PANEL
Albumin: 3.4 g/dL — ABNORMAL LOW (ref 3.5–5.2)
CO2: 19 mEq/L (ref 19–32)
Calcium: 9.1 mg/dL (ref 8.4–10.5)
Chloride: 110 mEq/L (ref 96–112)
GFR calc Af Amer: 26 mL/min — ABNORMAL LOW (ref >60)
GFR calc non Af Amer: 22 mL/min — ABNORMAL LOW (ref >60)
Sodium: 142 mEq/L (ref 135–145)

## 2010-12-20 LAB — IRON AND TIBC: Saturation Ratios: 28 % (ref 20–55)

## 2010-12-20 LAB — POCT HEMOGLOBIN-HEMACUE: Hemoglobin: 10.2 g/dL — ABNORMAL LOW (ref 12.0–15.0)

## 2010-12-21 LAB — DIFFERENTIAL
Basophils Absolute: 0 10*3/uL (ref 0.0–0.1)
Basophils Absolute: 0 10*3/uL (ref 0.0–0.1)
Basophils Absolute: 0 10*3/uL (ref 0.0–0.1)
Basophils Relative: 0 % (ref 0–1)
Basophils Relative: 0 % (ref 0–1)
Basophils Relative: 0 % (ref 0–1)
Eosinophils Absolute: 0 10*3/uL (ref 0.0–0.7)
Eosinophils Relative: 0 % (ref 0–5)
Eosinophils Relative: 2 % (ref 0–5)
Lymphocytes Relative: 7 % — ABNORMAL LOW (ref 12–46)
Lymphocytes Relative: 8 % — ABNORMAL LOW (ref 12–46)
Lymphocytes Relative: 9 % — ABNORMAL LOW (ref 12–46)
Lymphs Abs: 0.6 10*3/uL — ABNORMAL LOW (ref 0.7–4.0)
Lymphs Abs: 0.6 10*3/uL — ABNORMAL LOW (ref 0.7–4.0)
Monocytes Absolute: 0.3 10*3/uL (ref 0.1–1.0)
Monocytes Absolute: 0.6 10*3/uL (ref 0.1–1.0)
Monocytes Relative: 3 % (ref 3–12)
Monocytes Relative: 7 % (ref 3–12)
Monocytes Relative: 8 % (ref 3–12)
Neutro Abs: 5.6 10*3/uL (ref 1.7–7.7)
Neutro Abs: 6.9 10*3/uL (ref 1.7–7.7)
Neutro Abs: 7.1 10*3/uL (ref 1.7–7.7)
Neutrophils Relative %: 82 % — ABNORMAL HIGH (ref 43–77)
Neutrophils Relative %: 84 % — ABNORMAL HIGH (ref 43–77)

## 2010-12-21 LAB — BASIC METABOLIC PANEL
BUN: 51 mg/dL — ABNORMAL HIGH (ref 6–23)
BUN: 65 mg/dL — ABNORMAL HIGH (ref 6–23)
BUN: 87 mg/dL — ABNORMAL HIGH (ref 6–23)
BUN: 90 mg/dL — ABNORMAL HIGH (ref 6–23)
CO2: 27 mEq/L (ref 19–32)
CO2: 27 mEq/L (ref 19–32)
Calcium: 8.3 mg/dL — ABNORMAL LOW (ref 8.4–10.5)
Calcium: 8.5 mg/dL (ref 8.4–10.5)
Calcium: 8.5 mg/dL (ref 8.4–10.5)
Chloride: 108 mEq/L (ref 96–112)
Chloride: 111 mEq/L (ref 96–112)
Chloride: 114 mEq/L — ABNORMAL HIGH (ref 96–112)
Creatinine, Ser: 2.44 mg/dL — ABNORMAL HIGH (ref 0.4–1.2)
Creatinine, Ser: 2.84 mg/dL — ABNORMAL HIGH (ref 0.4–1.2)
Creatinine, Ser: 2.85 mg/dL — ABNORMAL HIGH (ref 0.4–1.2)
Creatinine, Ser: 2.98 mg/dL — ABNORMAL HIGH (ref 0.4–1.2)
GFR calc non Af Amer: 15 mL/min — ABNORMAL LOW (ref >60)
GFR calc non Af Amer: 15 mL/min — ABNORMAL LOW (ref >60)
GFR calc non Af Amer: 17 mL/min — ABNORMAL LOW (ref >60)
GFR calc non Af Amer: 19 mL/min — ABNORMAL LOW (ref >60)
Glucose, Bld: 107 mg/dL — ABNORMAL HIGH (ref 70–99)
Glucose, Bld: 114 mg/dL — ABNORMAL HIGH (ref 70–99)
Glucose, Bld: 174 mg/dL — ABNORMAL HIGH (ref 70–99)
Glucose, Bld: 52 mg/dL — ABNORMAL LOW (ref 70–99)
Glucose, Bld: 68 mg/dL — ABNORMAL LOW (ref 70–99)
Potassium: 4 mEq/L (ref 3.5–5.1)
Potassium: 4.3 mEq/L (ref 3.5–5.1)
Potassium: 4.6 mEq/L (ref 3.5–5.1)
Sodium: 139 mEq/L (ref 135–145)
Sodium: 141 mEq/L (ref 135–145)
Sodium: 142 mEq/L (ref 135–145)
Sodium: 142 mEq/L (ref 135–145)

## 2010-12-21 LAB — GLUCOSE, CAPILLARY
Glucose-Capillary: 123 mg/dL — ABNORMAL HIGH (ref 70–99)
Glucose-Capillary: 132 mg/dL — ABNORMAL HIGH (ref 70–99)
Glucose-Capillary: 136 mg/dL — ABNORMAL HIGH (ref 70–99)
Glucose-Capillary: 138 mg/dL — ABNORMAL HIGH (ref 70–99)
Glucose-Capillary: 140 mg/dL — ABNORMAL HIGH (ref 70–99)
Glucose-Capillary: 152 mg/dL — ABNORMAL HIGH (ref 70–99)
Glucose-Capillary: 173 mg/dL — ABNORMAL HIGH (ref 70–99)
Glucose-Capillary: 185 mg/dL — ABNORMAL HIGH (ref 70–99)
Glucose-Capillary: 342 mg/dL — ABNORMAL HIGH (ref 70–99)
Glucose-Capillary: 345 mg/dL — ABNORMAL HIGH (ref 70–99)
Glucose-Capillary: 55 mg/dL — ABNORMAL LOW (ref 70–99)
Glucose-Capillary: 75 mg/dL (ref 70–99)
Glucose-Capillary: 98 mg/dL (ref 70–99)

## 2010-12-21 LAB — PROTIME-INR
INR: 1.2 (ref 0.00–1.49)
Prothrombin Time: 15.3 seconds — ABNORMAL HIGH (ref 11.6–15.2)

## 2010-12-21 LAB — COMPREHENSIVE METABOLIC PANEL
Albumin: 3.4 g/dL — ABNORMAL LOW (ref 3.5–5.2)
Alkaline Phosphatase: 86 U/L (ref 39–117)
BUN: 76 mg/dL — ABNORMAL HIGH (ref 6–23)
GFR calc Af Amer: 19 mL/min — ABNORMAL LOW (ref >60)
Potassium: 4.2 mEq/L (ref 3.5–5.1)
Sodium: 141 mEq/L (ref 135–145)
Total Protein: 7.2 g/dL (ref 6.0–8.3)

## 2010-12-21 LAB — CBC
HCT: 23.2 % — ABNORMAL LOW (ref 36.0–46.0)
HCT: 28.1 % — ABNORMAL LOW (ref 36.0–46.0)
HCT: 29.8 % — ABNORMAL LOW (ref 36.0–46.0)
Hemoglobin: 8.5 g/dL — ABNORMAL LOW (ref 12.0–15.0)
Hemoglobin: 9.2 g/dL — ABNORMAL LOW (ref 12.0–15.0)
Hemoglobin: 9.5 g/dL — ABNORMAL LOW (ref 12.0–15.0)
MCHC: 33.7 g/dL (ref 30.0–36.0)
MCV: 89 fL (ref 78.0–100.0)
MCV: 90.2 fL (ref 78.0–100.0)
Platelets: 123 10*3/uL — ABNORMAL LOW (ref 150–400)
Platelets: 128 10*3/uL — ABNORMAL LOW (ref 150–400)
Platelets: 136 10*3/uL — ABNORMAL LOW (ref 150–400)
Platelets: 136 10*3/uL — ABNORMAL LOW (ref 150–400)
RBC: 3.08 MIL/uL — ABNORMAL LOW (ref 3.87–5.11)
RDW: 17 % — ABNORMAL HIGH (ref 11.5–15.5)
RDW: 17.6 % — ABNORMAL HIGH (ref 11.5–15.5)
RDW: 18.3 % — ABNORMAL HIGH (ref 11.5–15.5)
RDW: 18.6 % — ABNORMAL HIGH (ref 11.5–15.5)
WBC: 7.4 10*3/uL (ref 4.0–10.5)
WBC: 8.3 10*3/uL (ref 4.0–10.5)

## 2010-12-21 LAB — URINALYSIS, MICROSCOPIC ONLY
Bilirubin Urine: NEGATIVE
Ketones, ur: NEGATIVE mg/dL
Nitrite: NEGATIVE
pH: 5 (ref 5.0–8.0)

## 2010-12-21 LAB — HEPATIC FUNCTION PANEL
ALT: 11 U/L (ref 0–35)
Alkaline Phosphatase: 92 U/L (ref 39–117)
Bilirubin, Direct: 0.1 mg/dL (ref 0.0–0.3)
Indirect Bilirubin: 0.5 mg/dL (ref 0.3–0.9)
Total Bilirubin: 0.6 mg/dL (ref 0.3–1.2)
Total Protein: 7.7 g/dL (ref 6.0–8.3)

## 2010-12-21 LAB — IRON AND TIBC
Iron: 17 ug/dL — ABNORMAL LOW (ref 42–135)
Iron: 46 ug/dL (ref 42–135)
Saturation Ratios: 19 % — ABNORMAL LOW (ref 20–55)
TIBC: 224 ug/dL — ABNORMAL LOW (ref 250–470)
TIBC: 245 ug/dL — ABNORMAL LOW (ref 250–470)
UIBC: 199 ug/dL
UIBC: 207 ug/dL

## 2010-12-21 LAB — PHOSPHORUS: Phosphorus: 3.7 mg/dL (ref 2.3–4.6)

## 2010-12-21 LAB — TSH: TSH: 0.396 u[IU]/mL (ref 0.350–4.500)

## 2010-12-21 LAB — CROSSMATCH

## 2010-12-21 LAB — ABO/RH: ABO/RH(D): B POS

## 2010-12-21 LAB — FERRITIN: Ferritin: 288 ng/mL (ref 10–291)

## 2010-12-21 LAB — LIPID PANEL: VLDL: 15 mg/dL (ref 0–40)

## 2010-12-21 LAB — BRAIN NATRIURETIC PEPTIDE
Pro B Natriuretic peptide (BNP): 581 pg/mL — ABNORMAL HIGH (ref 0.0–100.0)
Pro B Natriuretic peptide (BNP): 624 pg/mL — ABNORMAL HIGH (ref 0.0–100.0)
Pro B Natriuretic peptide (BNP): 837 pg/mL — ABNORMAL HIGH (ref 0.0–100.0)
Pro B Natriuretic peptide (BNP): 979 pg/mL — ABNORMAL HIGH (ref 0.0–100.0)

## 2010-12-21 LAB — CARDIAC PANEL(CRET KIN+CKTOT+MB+TROPI)
CK, MB: 3.5 ng/mL (ref 0.3–4.0)
Relative Index: INVALID (ref 0.0–2.5)
Total CK: 70 U/L (ref 7–177)
Troponin I: 0.19 ng/mL — ABNORMAL HIGH (ref 0.00–0.06)

## 2010-12-21 LAB — CK TOTAL AND CKMB (NOT AT ARMC)
Relative Index: INVALID (ref 0.0–2.5)
Total CK: 70 U/L (ref 7–177)

## 2010-12-21 LAB — APTT: aPTT: 32 seconds (ref 24–37)

## 2010-12-21 LAB — HEMOGLOBIN A1C: Mean Plasma Glucose: 100 mg/dL

## 2010-12-21 LAB — FOLATE RBC: RBC Folate: 414 ng/mL (ref 180–600)

## 2011-01-23 NOTE — Group Therapy Note (Signed)
NAMESEVEN, MARENGO               ACCOUNT NO.:  [id number]   MEDICAL RECORD NO.:  [id number]          PATIENT TYPE:  INP   LOCATION:  A330                          FACILITY:  [facility]   PHYSICIAN:  Dorris Singh, DO    DATE OF BIRTH:  04-Jan-1932   DATE OF PROCEDURE:  DATE OF DISCHARGE:                                 PROGRESS NOTE   HISTORY:  The patient is being followed by Cardiology.  She seems to be  improving with diuresing and we will continue her antibiotics.  Also GI  has been following her.  Her hemoglobin has been remaining stable.  They  recommend outpatient followup for her in a few weeks  and they have  signed off the case.  Dr. Kristian Covey has also signed off since her renal  function is stable.  Currently, we are just monitoring her to have her  fluid status improved.  We will check that in the morning.  I have  discussed plan of care with the patient.   PHYSICAL EXAMINATION:  VITAL SIGNS:  Temperature 97.9, pulse 78,  respirations 18, blood pressure 122/52.  GENERAL:  She is well-  developed, well-nourished in no acute distress.  HEART:  Regular rate and rhythm.  LUNGS:  Clear to auscultation bilaterally.  ABDOMEN:  Soft, nontender.  EXTREMITIES:  Positive pulses with pitting edema and positive Foley  present.   LABORATORY DATA:  White count is 8.1, hemoglobin 10.1, hematocrit 29.8,  platelet count of 136, sodium is 141, potassium 4.2, chloride 108, CO2  24, glucose 120, BUN 76 and creatinine 2.94.   ASSESSMENT/PLAN:  1. Pneumonia, this seems to be resolving well.  We will continue with      treatments while she is still inpatient.  2. Congestive heart failure, the patient is diuresing well.  3. Renal failure.  She seems to be at her baseline.  We will continue      to monitor her and anticipate discharge in 1-2 days.      Dorris Singh, DO  Electronically Signed     CB/MEDQ  D:  11/17/2008  T:  11/17/2008  Job:  [id number]

## 2011-01-23 NOTE — Consult Note (Signed)
[name], [name]               ACCOUNT NO.:  [id number]   MEDICAL RECORD NO.:  [id number]          PATIENT TYPE:  INP   LOCATION:  A330                          FACILITY:  [facility]   PHYSICIAN:  Gerrit Friends. Dietrich Pates, MD, FACCDATE OF BIRTH:  [birth year]   DATE OF CONSULTATION:  DATE OF DISCHARGE:                                 CONSULTATION   PRIMARY CARE PHYSICIAN:  Dr. Regino Schultze.   NEPHROLOGIST:  Dr. Hyman Hopes.   REFERRING PHYSICIAN:  Dr. Osvaldo Shipper of [facility] Hospitalist Team  P.   REASON FOR CONSULTATION:  Congestive heart failure.   HISTORY OF PRESENT ILLNESS:  [name] is a 77 year old female with a  history of hypertension, diabetes mellitus and chronic kidney disease  who presented to [facility] yesterday with  complaints of falling at home.  She apparently hit her head and head and  neck CT were both negative for anything acute.  While in the emergency  room she complained of shortness of breath over the last several weeks  and a chest x-ray was notable for congestive heart failure.  She was  admitted for further evaluation and treatment.  She describes a 1 to 2  week history of cough and congestion as well as wheezing.  She was  apparently treated by her primary care physician with antibiotics and  was feeling somewhat better.  However, she ate a high salt meal late  last week (chicken and dumplings) and noted increased pedal edema as  well as shortness of breath after this.  She denies any chest pain or  syncope.  She denies orthopnea.  She says her cough and wheezing also  got somewhat worse after falling yesterday.  In the emergency room her  BNP was elevated.  Today it is 979.  Her creatinine is up to 2.85 and  her hemoglobin is down to 7.5.  She has had a blood transfusion with  packed red blood cells ordered.  She is being followed by nephrology.  We are asked to further evaluate concerning her congestive heart  failure.   PAST MEDICAL  HISTORY:  As outlined above.  In addition:  1. Echocardiogram September 16, 2008, performed at Dr. Marland Mcalpine office in      [place] demonstrating normal LV function, mild LVH, mild      biatrial enlargement, moderate tricuspid regurgitation and moderate      to severe pulmonary hypertension.  2. Diabetes mellitus.  3. Hypertension.  4. Chronic kidney disease.  5. Anemia.  6. Hypothyroidism.  7. Eye surgery.  8. Status post left lower extremity ORIF secondary to fracture in the      1970s.   MEDICATIONS AT HOME:  1. Torsemide 20 mg p.r.n.  2. Poly iron 150 mg daily.  3. Xanax 0.5 mg p.r.n.  4. Allegra 180 mg daily.  5. Norvasc 10 mg daily.  6. HCTZ 25 mg daily.  7. Synthroid 0.125 mg daily except 2 tablets on Sundays.  8. Glipizide 10 mg daily.  9. Actos 30 mg daily.   She notes a recent history of iron infusion and  completed this after 5  weeks last week.   ALLERGIES:  IV DYE.   SOCIAL HISTORY:  The patient lives in [place] with her son.  She  denies tobacco or alcohol abuse.   FAMILY HISTORY:  Significant for CAD.  Her mother died in her 3s of a  myocardial infarction, brother died in his 16s and a sister died in her  67s with myocardial infarctions.   REVIEW OF SYSTEMS:  Please see HPI.  Denies fevers, has had a sore  throat, denies dysuria, denies hematuria, denies bright red blood per  rectum or melena.  Denies dysphagia.  She has had some discoloration of  her bilateral extremities.  The rest of the review of systems are  negative.   PHYSICAL EXAM:  She is a well-nourished, well-developed female in no  distress.  Blood pressure is 118/59, pulse 80, respirations 18,  temperature 97.2, oxygen saturation 98% on 2 liters, weight 126.3 kg.  HEENT:  Normal.  NECK:  With minimal JVD.  LYMPH:  Without lymphadenopathy.  ENDOCRINE:  Without thyromegaly.  CARDIAC:  Normal S1 - S2, question mid systolic click versus split P2,  holosystolic murmur grade 2/6 heard along the  left lower sternal border.  LUNGS:  With bibasilar rales, no wheezes.  SKIN:  Without rash.  ABDOMEN:  Soft, nontender with normoactive bowel sounds, no  organomegaly.  EXTREMITIES:  With 1+ edema bilaterally.  NEUROLOGIC:  She is alert and oriented x3.  Cranial nerves II-XII are  grossly intact.  MUSCULOSKELETAL:  Without joint deformity.  VASCULAR:  Without carotid bruits bilaterally.   Chest x-ray:  Bilateral interstitial opacities with hilar prominence and  cardiomegaly, probable interstitial edema - question atypical pneumonia  - correlate clinically.  EKG:  Sinus rhythm, heart rate of 86, normal  axis, T-wave inversion in V1-V3, interventricular conduction delay,  first degree AV block, PR interval 202 milliseconds.   LABS:  White count 7400, hemoglobin 7.5, hematocrit 23.2, MCV 90,  platelet count 123,000, sodium 41, potassium 4.6, BUN 65, creatinine  2.5, glucose 114.  LFTs okay, albumin 3.6, TSH 0.396, BNP 979; CK-MB  3.5, 4.6, 3.8; troponin-I 0.03, 0.09, 0.19.  Iron 17, TIBC 224, ferritin  288, total cholesterol 130, triglycerides 74, HDL 23 LDL 92.   ASSESSMENT:  1. Acute congestive heart failure (probably diastolic congestive heart      failure) in a 77 year old female with a history of chronic kidney      disease and normocytic anemia.  2. Minimally elevated troponins.  3. Mild thrombocytopenia.  4. Hypertension.  5. Diabetes mellitus.  6. Hypothyroidism.  7. Pulmonary hypertension.   RECOMMENDATIONS:  The patient was also interviewed and by Dr. Dietrich Pates.  The patient presents with a volume overload in the setting of anemia and  chronic kidney disease.  She has had a recent 10 - 15 pounds weight gain  with symptoms of congestive heart failure and pedal edema.  She has  pulmonary edema on her chest x-ray.  She has had no significant diuresis  with initial furosemide administration.  Her echocardiogram in January  of 2010 demonstrated left ventricular hypertrophy  with normal ejection  fraction obtained, also tricuspid regurgitation with moderate pulmonary  hypertension and increased right atrial pressures.  She needs continued  diuresis and we will increase her dose to 80 mg IV three times a day.  This may cause an increase her creatinine which will need close  monitoring.  Her anemia seems be out of proportion to her  creatinine.  She needs erythropoiesis stimulator.  Consideration of Procrit versus  Epogen can be made by nephrology versus her primary service.  Elevated  troponin is likely secondary to her congestive heart failure in the  setting of chronic kidney disease.  No further cardiac workup is planned  at this time.  Her hypertension seems to be controlled.  Her current  medicines will be continued.  She does have risk equivalent of CAD with  her diabetes mellitus and her LDL is 92.  Her HDL is also low at 23.  Consideration will likely need to be given towards placing her on statin  and treatment as well.  Thank you very much for the consultation.  We  will be glad to follow the patient throughout the remainder of this  admission.      Kara Newcomer, PA-C      Gerrit Friends. Dietrich Pates, MD, [facility]  Electronically Signed    SW/MEDQ  D:  11/15/2008  T:  11/15/2008  Job:  [id number]   cc:   [name], M.D.  Fax: [phone]   Garnetta Buddy, M.D.  Fax: [phone]   Osvaldo Shipper, MD

## 2011-01-23 NOTE — Consult Note (Signed)
NAMESHANEL, [name]               ACCOUNT NO.:  [id number]   MEDICAL RECORD NO.:  [id number]          PATIENT TYPE:  INP   LOCATION:  A330                          FACILITY:  [facility]   PHYSICIAN:  Jorja Loa, M.D.DATE OF BIRTH:  June 30, 1932   DATE OF CONSULTATION:  11/13/2008  DATE OF DISCHARGE:                                 CONSULTATION   The patient is admitted under the hospitalist service.  [name] is a  77 year old African American with past medical history of hypertension,  longstanding history of diabetes, and also history of possibly chronic  renal failure being followed by Dr. Hyman Hopes in [place] presently  admitted to the hospital with a history of a fall.  However, when workup  was done, the patient was found to have congestive heart failure.  She  was hospitalized for further workup.  According to the patient, she has  been doing very well and she was trying to help her daughter with a  dialysis patient.  She pulled down.  She complains of some headache and  neck pain.  Otherwise, overall feels good.  At this moment, she does not  know the extent of her renal failure, but according to her and her  daughter, the patient seems to be on iron and getting Epogen.  There is  no __________.  She does not have any nausea or vomiting nor shortness  of breath, dizziness, or lightheaded.   PAST MEDICAL HISTORY:  She has longstanding history of hypertension.  She has history of hypothyroidism, history of diabetes, history of  glaucoma, history of hypercholesterolemia, and she has also history of  chronic renal failure, and also history of anemia.  She is on iron.  She  has also history of CHF.   MEDICATIONS:  Her medications at this moment consist of,  1. Norvasc 10 mg p.o. daily.  2. Aspirin 81 mg p.o. daily.  3. Cipro 500 mg p.o. b.i.d.  4. She is also on Lasix 60 mg IV b.i.d.  5. Glipizide 5 mg p.o. daily.  6. Novolin insulin.  7. Nu-Iron 150 mg p.o. daily.  8.  Xopenex 0.6 mg every 6 hours.  9. Synthroid 125 mcg p.o. daily and other medications at this moment      seems to be p.r.n. medication.   ALLERGIES:  She is allergic to contrast media.   FAMILY HISTORY:  She has a daughter who is on dialysis.  Source not  clear, but possibly diabetes, and she has a brother also with diabetes.   SOCIAL HISTORY:  No history of smoking.  No history of alcohol abuse.  She is retired from a [employer].   REVIEW OF SYSTEMS:  Presently, she is feeling better.  She does not have  any nausea or vomiting.  No shortness of breath, dizziness, or  lightheadedness.  She denies any fevers, chills, sweating.  I feel she  has some pain of her knee.   PHYSICAL EXAMINATION:  VITAL SIGNS:  Her temperature is 98.2, pulse of  89, blood pressure 120/50.  HEENT:  No conjunctival pallor.  No icterus.  CHEST:  Clear to auscultation.  No rales or rhonchi.  No egophony.  HEART:  Regular rate and rhythm. No murmur. No S3.  ABDOMEN:  Obese.  Positive bowel sounds.  Nontender.  EXTREMITIES:  She does not have any edema except sign of possible  chronic venous.   Her white blood cell count is 6.2, hemoglobin 8.5, hematocrit 25.9,  platelet of 136, INR is 1.2.  PT is 15.3.  Sodium 179, potassium 4.4,  BUN is 51, creatinine is 2.41.  Phosphorus is 4.1, albumin is 3.6,  calcium of 8.2.  BNP is 624.  CPK is 66.   ASSESSMENT:  1. Renal failure, seems to be chronic since being followed by      Nephrology probably at this moment, stage III to stage IV.  She had      ultrasound from 2004 which showed bilaterally echogenic kidneys.      The etiology for her renal failure at this moment does not seem to      be clear possibly secondary hypertensive nephrosclerosis.  Since,      she has diabetes, diabetic nephropathy also need to be entertained.  2. Anemia possibly a combination of iron-deficiency anemia and anemia      of chronic disease.  She is on iron and Epogen.  Hemoglobin and       hematocrit seems to be somewhat low.  3. History of hypertension.  She is on Norvasc.  Blood pressure seems      to be controlled very well.  4. History of diabetes.  She is on insulin and hypoglycemic agent.  5. History of proteinuria.  6. History of hypothyroidism.  She is on Synthroid.  7. History of congestive heart failure.  She is on Lasix.  She has      some urine output.  No sign of fluid overload.  8. Possible pneumonia.  She is on antibiotics.  Overall, the patient      seems to be doing reasonably good.  We will continue with present      management, since the patient has chronic renal failure and then      she has a workup __________ any workup except following her renal      function.  If renal function deteriorates probably will make some      arrangements, otherwise will continue and follow her labs.      Jorja Loa, M.D.  Electronically Signed     BB/MEDQ  D:  11/14/2008  T:  11/14/2008  Job:  [id number]

## 2011-01-23 NOTE — Discharge Summary (Signed)
[name], [name]               ACCOUNT NO.:  [id number]   MEDICAL RECORD NO.:  [id number]          PATIENT TYPE:  INP   LOCATION:  A330                          FACILITY:  [facility]   PHYSICIAN:  Dorris Singh, DO    DATE OF BIRTH:  Mar 03, 1932   DATE OF ADMISSION:  11/13/2008  DATE OF DISCHARGE:  03/13/2010LH                               DISCHARGE SUMMARY   ADMISSION DIAGNOSES:  1. Pulmonary edema likely secondary to chronic kidney disease versus      heart disease.  2. History of chronic kidney disease, stage IV.  3. Obesity.  4. Hypertension.  5. Diabetes mellitus.  6. Cardiomegaly and abnormal echocardiogram.   DISCHARGE DIAGNOSES:  1. Pneumonia.  This is improving.  2. Congestive heart failure improving.  3. Chronic kidney disease, stage IV.  4. Diabetes.  5. Hypertension.   PRIMARY CARE PHYSICIAN:  [name], M.D.   TESTING:  1. When she first came in on March 6, she had bilateral view of the      knees which was negative.  2. Right shoulder which was negative.  3. Head CT without contrast, degenerative changes.  No evidence for      acute abnormality of the C-spine.  4. CT of the spine, degenerative changes.  No evidence of acute      abnormality.  5. She had __________ chest on the 6th which shows bilateral      interstitial opacities with hilar prominence, cardiomegaly and      appearance suggested interstitial edema in the setting of CHF with      atypical pneumonia.  Hilar adenopathy may have similar appearance.      Correlate with signs of infection.  6. On March 7 she had a renal ultrasound which showed interval      asymmetric renal parenchymal loss, right worse than left.  No      hydronephrosis.  7. On March 8,  she had a one-view chest which showed more moderate      congestive heart failure, decreased lung volumes with increased      bibasilar atelectasis.  Suspect underlying bilateral pleural      effusions.  8. On March 11, she had an  interval improvement in congestive heart      failure.   HOSPITAL COURSE:  1. To summarize, the patient is a 77 year old African American female      who was admitted with the above diagnoses.  She was admitted to the      service of [facility] where she was started on IV diuretics for      pulmonary edema and echocardiogram was done.  Her results are as      follows.  The EF fraction is 55% and please see full report.  We      also followed her BNP and Dr. Kristian Covey was consulted for chronic      kidney disease.  Even though she sees Dr. Hyman Hopes in [place], we      have requested Dr. Kristian Covey  to be involved and the patient since  the patient is getting Procrit shots as well.  2. Anemia of chronic disease.  She has been getting IV iron as well as      a weekly shot of Procrit and so Dr. Kristian Covey continued this while      she was here.  The patient had fallen. That is why she had multiple      x-rays of her knees, shoulder and head while she has been here.      Physical therapy has also seen her and we have not had any history      of fall.  At this point in time, the patient clinically states that      she feels better and is anxious to go home.  I will go ahead and      have her follow up closely with her doctors, particularly      cardiology which she can see on Monday for any adjustment in her      Lasix therapy.  It is recommended she follow up with Dr. Hyman Hopes as      well.  She has had to be put on diuretics and this will need to be      addressed as well.   DISCHARGE MEDICATIONS:  1. She will go home on torsemide 20 mg daily.  2. Poly iron 150 mg daily.  3. Xanax 0.5 mg as needed.  4. Allegra 180 mg once a day.  5. Norvasc 10 mg once a day.  6. Hydrochlorothiazide 25 mg once a day.  7. Synthroid 25 mcg once a day and 125 mcg two on Sunday.  8. Glipizide 10 mg once a day.  9. The plan is to have her take Lasix 20 mg x3 days on top of the      torsemide and  hydrochlorothiazide which is what she was on on      admission.  Only do diuretics short-term and have her follow up      with cardiology sometime next week preferably.  The patient will      have to call to set up an appointment for Tuesday or Wednesday, as      long as she continues to improve clinically.  Have given the      patient the signs as to when she needs to come back to be      evaluated.  She has stated understanding.   DISCHARGE INSTRUCTIONS:  Increase her activity slowly and to walk with  assistance.  She is to do a renal diet.  She is to call Dr. Marvel Plan  office next week and set up an appointment for same and also Dr. Marland Mcalpine  office as soon as possible as well.  She is to take her medications as  directed and she is to follow up with Dr. Dietrich Pates and Dr. Shelva Majestic.   Her condition is stable and her disposition will be to home.      Dorris Singh, DO  Electronically Signed     CB/MEDQ  D:  11/20/2008  T:  11/20/2008  Job:  [id number]   cc:   [name], M.D.  Fax: [phone]

## 2011-01-23 NOTE — Consult Note (Signed)
NAMECORLIS, ANGELICA               ACCOUNT NO.:  [id number]   MEDICAL RECORD NO.:  [id number]          PATIENT TYPE:  INP   LOCATION:  A330                          FACILITY:  [facility]   PHYSICIAN:  R. Roetta Sessions, M.D. DATE OF BIRTH:  10/18/1931   DATE OF CONSULTATION:  DATE OF DISCHARGE:                                 CONSULTATION   REQUESTING PHYSICIAN:  [facility].   REASON FOR CONSULTATION:  Anemia.   HISTORY OF PRESENT ILLNESS:  Ms. Efaw is a 77 year old African  American female.  She was admitted to [facility] after a fall  and with congestive heart failure and possible pneumonia.  Just last  week she was started on parenteral iron under the direction of Dr. Hyman Hopes  and Dr. Mariel Sleet.  She was supposed to start on what she believes may  be Procrit today.  She was told she would be getting weekly injections.  She had a history of anemia of chronic disease and iron deficiency.  She  was actually evaluated by Dr. Jena Gauss back in 2004, she underwent a  colonoscopy where she had a poor prep.  She was found to have a left-  sided diverticula and a long redundant capacious colon.  She had an EGD  which was normal and a small bowel Given capsule study at [facility]  which was negative.  Her Hemoccult status was unknown, however on exam  today it was negative.  She tells me she has had anemia for about 5  years, she has been taking p.o. iron but has not been taking it on a  regular basis since she felt as though she could not afford it.  She  tells me when she did take her iron her hemoglobin seem to stay in the  normal range as far she can recall.  She denies any rectal bleeding and  she denies any melena.  Her first dose of IV iron was last week.  She is  scheduled to receive 2 units of packed RBCs today as her hemoglobin  dropped to a low of 7.5, it was 8.5 on admission.  Her weight is stable,  it does sometimes steadily increase, she feels this is due to fluid.  She  has had some anorexia.  She denies any nausea, vomiting, dysphagia  or odynophagia.  She has been on a renal diet at home.  She has a  history of chronic constipation, she usually goes up to 3 weeks without  a bowel movement.  She has now been having a bowel movement about twice  a week.  She denies any history of diarrhea, rectal bleeding or melena.  She has never taken laxatives or enemas.  She denies any NSAID use.  She  did have a normal TSH since admission as well.   PAST MEDICAL AND SURGICAL HISTORY:  1. History of previous GI workup including colonoscopy, EGD and      Given's capsule as per HPI.  2. Hypertension.  3. Hypothyroidism.  4. Diabetes mellitus.  5. Glaucoma.  6. Hyperlipidemia.  7. Chronic kidney disease.  8. CHF.  9. Left leg fracture with fixation.   MEDICATIONS PRIOR TO ADMISSION:  1. Torsemide 20 mg p.r.n.  2. Poly iron 150 mg daily.  3. Xanax 0.5 mg p.r.n.  4. Allegra 180 mg daily.  5. Norvasc 10 mg daily.  6. Hydrochlorothiazide 25 mg daily.  7. Synthroid 125 mcg daily, except for 250 mcg on Sunday.  8. Glipizide 10 mg daily.   ALLERGIES:  CONTRAST IV DYE.   FAMILY HISTORY:  There is questionable history about one family member  who may have had colon cancer but she never had a complete workup.  Family history is otherwise significant for a daughter with chronic  kidney disease on dialysis, a son with GERD, multiple children with  diabetes mellitus.  Mother deceased in her 70s with history of CHF.  Father deceased in his 61s with history of CVA.  She has lost three  brothers to CHF and one sister.  She had a total of nine siblings.   SOCIAL HISTORY:  Ms. Roig is divorced.  She lives with her son.  She  has five living children and one deceased in an automobile accident.  She is currently retired from a [employer].  She denies any tobacco,  alcohol or drug use.   REVIEW OF SYSTEMS:  See HPI.  NEURO:  She did have fall prior to  admission to the  hospital.  She did have a head CT on November 13, 2008  which showed degenerative C-spine changes, no evidence of acute  intracranial abnormality, chronic sinusitis, otherwise negative review  of systems.   PHYSICAL EXAM:  VITAL SIGNS:  Temperature of 97.2, pulse 80,  respirations 18, blood pressure 118/59, O2 sat 98% on 2 liters per  minute, weight 126.3 kilograms, height 68 inches.  GENERAL:  Ms. Grieshop is an obese [place] American female who is alert,  oriented, pleasant and cooperative, in no acute distress.  HEENT:  Sclerae clear, nonicteric, conjunctivae pink.  Oropharynx pink  and moist without any lesions.  NECK:  Supple without any mass or thyromegaly.  CHEST:  Heart regular rate and rhythm, normal S1-S2 without no murmurs,  clicks, rubs or gallops.  ABDOMEN:  Protuberant with positive bowel sounds x4, no bruits  auscultated.  Soft, nontender, nondistended without palpable mass or  hepatosplenomegaly.  Exam is extremely limited given the patient's body  habitus.  RECTAL:  No external lesions visualized, good sphincter tone, no  internal masses.  She did have large amount of formed stool in the vault  which was medium brown and Hemoccult negative.  Exam was limited by  this.  EXTREMITIES:  With 1+ pretibial edema bilaterally, there is no clubbing.  SKIN:  She does have an erythematous ulcer to her right posterior thigh,  she tells me this is due to a burn.   LABORATORY STUDIES:  Hemoglobin 7.5, hematocrit 23.2, MCV 90.2, white  blood cell count of 7.4, platelet count 123, INR from yesterday 1.2,  ProTime is 15.3, PTT 32, sodium 141, potassium 4.6, chloride 114, CO2 -  20, glucose 114, BUN 65, creatinine 2.85, calcium 8.9, phosphorus 3.7,  yesterday total bilirubin was 0.6, direct 1.5, indirect was 0.5,  alkaline phosphatase 92, AST 21, ALT 11, total protein 7.7 and albumin  3.6, hemoglobin A1c was 5.1.  BNP was 979 today, she did have two  positive troponins and CK-MB of 4.6.   other cardiac markers were  negative.  Cholesterol is 130.  Her TSH was 0.396.  Her iron was 17.  Her TIBC 224, percent saturation 8.  UIBC 207 and ferritin 298.  Urinalysis showed trace protein and trace leukocytes.   IMPRESSION:  Ms. Chilton is a 77 year old African female with acute on  chronic anemia/iron deficiency anemia.  Her hemoglobin drifted from 8.5  to 7.5, she is now receiving 2 units packed red blood cells today.  She  was admitted with congestive heart failure and possible pneumonia.  She  is on antibiotics and diuretics.  Previous gastroenterology workup in  2004 by Dr. Jena Gauss included a normal EGD and a marginal poor prep  colonoscopy with a long redundant and capacious colon and left-sided  diverticula and a negative small bowel Given's capsule study.  She has  history of chronic constipation.  She has received parenteral iron last  week.  She has a history of chronic kidney disease and followed by Dr.  Hyman Hopes in [place].  Hemoccult negative on exam by me today.  She has  been followed by Dr. Mariel Sleet previously.   I suspect she has chronic iron deficiency anemia with mixed anemia of  chronic disease.  She is possible she could have malabsorption of iron.  There is no evidence of gastrointestinal bleeding at this time.  My  concern is that her last colonoscopy had a marginal to poor prep and  will discuss this further with Dr. Jena Gauss as whether this needs to be  repeated to rule out colorectal carcinoma.   PLAN:  1. I agree with hemocculting stools x3 and we will follow up on this.  2. Would add Colace 100 mg b.i.d.  3. Would add MiraLAX 17 grams to her current fluid intake as we do not      want to increase her fluid intake given her history of kidney      disease and CHF which will need to be monitored closely.  4. Will discuss repeating colonoscopy plus or minus EGD with Dr. Jena Gauss      if she is Hemoccult positive.   We would like to thank the [facility]  for allowing [place] to  participate in the care of Ms. Poynter.  Lorenza Burton, N.P.      Jonathon Bellows, M.D.  Electronically Signed    KJ/MEDQ  D:  11/15/2008  T:  11/15/2008  Job:  [id number]   cc:   Garnetta Buddy, M.D.  Fax: [phone]   Ladona Horns. Mariel Sleet, MD  Fax: [phone]

## 2011-01-23 NOTE — H&P (Signed)
NAMEAMAAL, DIMARTINO               ACCOUNT NO.:  [id number]   MEDICAL RECORD NO.:  [id number]          PATIENT TYPE:  INP   LOCATION:  IC02                          FACILITY:  [facility]   PHYSICIAN:  Osvaldo Shipper, MD     DATE OF BIRTH:  [birth year]   DATE OF ADMISSION:  11/13/2008  DATE OF DISCHARGE:  LH                              HISTORY & PHYSICAL   PRIVATE MEDICAL DOCTOR:  [name], M.D.   NEPHROLOGIST:  Garnetta Buddy, M.D. with [facility].   She has never seen a heart doctor.   ADMITTING DIAGNOSES:  1. Pulmonary edema, likely secondary to chronic kidney disease versus      heart disease.  2. History of chronic kidney disease, likely stage IV.  3. Obesity.  4. Hypertension.  5. Diabetes mellitus.  6. Cardiomegaly and abnormal electrocardiogram.   CHIEF COMPLAINT:  Fall and shortness of breath.   HISTORY OF PRESENT ILLNESS:  The patient is a 77 year old African  American female who was in her usual state of health until about a few  days prior to October 25, 2008, when she started feeling short of  breath and started wheezing and had a cough. She went to her doctor's  office on February 15 and was diagnosed with bronchitis.  She was given  an injection and started on inhaler treatments. She started feeling  better but her cough and wheezing did not go away.  She went to see her  nephrologist on February 23.  She receives Procrit injections every 2  weeks and she has been receiving iron infusion therapy as well for her  anemia.  Today, the patient stumbled in her bedroom and fell.  She is  complaining of some neck pain.  She did not lose consciousness.  Denies  any chest pain.  She has been having shortness of breath as mentioned  earlier.  She has been having a dry cough, wheezing.  She also mentions  leg swelling.  She is unable to tell me if she has orthopnea or PND.  She is unable to give me this history at this time. She does use only  one  pillow on her bed while sleeping and that has not changed recently.  She denies any weight gain, although again she is not very clear about  this.   Her granddaughter was also in the room with the patient.  History was  somewhat inadequate.   MEDICATIONS:  At home:  1. Torsemide 20 mg, she takes it on an as needed basis.  2. Poly-Iron 150 mg daily.  3. Xanax 0.5 mg every 6 hours as needed for anxiety.  4. Actos 30 mg daily.  5. Norvasc 10 mg daily.  6. Hydrochlorothiazide 25 mg daily.  7. Synthroid 125 mcg every day and then 250 mcg on Sunday.  8. Glipizide ER 10 mg daily.  9. She finished 5 doses of iron infusion, she is on Procrit every 2      weeks.  I do not know the dose.   ALLERGIES:  DYE, presuming this is IVP dye.  She is unable to tell me  very clearly.   PAST SURGICAL HISTORY:  Surgery to the eyes, surgery to the left leg  because of fracture.  She has had a colonoscopy done in 2004 which  revealed diverticula, otherwise no other specific abnormalities were  noted.  She also has had an EGD done which was also unremarkable.  These  endoscopies were done to evaluate her anemia.   PAST MEDICAL HISTORY:  1. Allergies.  2. Anemia.  3. Anxiety.  4. Arthritis.  5. Diabetes mellitus.  6. Hypertension.  7. Hypothyroidism.  8. Chronic kidney disease.  No history of stroke, lung disease or heart attacks.   SOCIAL HISTORY:  Lives in [place] with her son. Has poor functional  capacity, uses a cane and walker.  She climbs a few stairs and fears  shortness of breath.  Denies smoking, alcohol or illicit drug use.   FAMILY HISTORY:  Positive for heart disease in her mother's side,  strokes in her father's side.  She also mentions kidney disease in her  father's side.   REVIEW OF SYSTEMS:  GENERAL:  Positive for weakness, malaise.  HEENT:  Unremarkable.  CARDIOVASCULAR:  Unremarkable except as in history of present illness.  RESPIRATORY:  As in HPI.  GI:  Unremarkable.   GU: Unremarkable.  NEUROLOGIC:  Unremarkable.  PSYCHIATRIC:  Unremarkable.  DERMATOLOGIC:  Unremarkable.  MUSCULOSKELETAL:  Positive as in HPI for neck pain.  Other systems unremarkable.   PHYSICAL EXAMINATION:  VITAL SIGNS:  Temperature 97.4, blood pressure  153/71 on admission to the ED and then 130/58 as the last reading.  Heart rate in the 90s and regular.  Respiratory rate 22.  Saturation 96%  in room air.  GENERAL:  This is an obese African American female in no acute distress.  HEENT:  There is no pallor, no icterus.  Oral mucosal membranes moist,  no oral lesions noted.  NECK:  Soft, supple.  There is some tenderness but no restriction to  rupture of membranes.  LUNGS:  Few scattered end expiratory wheezes bilaterally.  Crackles  appreciated at both bases.  CARDIAC:  S1 and S2 normal and regular. S3 is present.  No S4 is  appreciated.  JVD was difficult to appreciate.  No bruits  were heard.  Edema 1+ pitting was noted in the lower extremities.  ABDOMEN:  Obese, nontender, nondistended.  Bowel sounds are present. No  mass or organomegaly is appreciated.  MUSCULOSKELETAL:  Unremarkable.  NEUROLOGIC:  Unremarkable.  GENITOURINARY:  Deferred.   LABORATORY DATA:  White count 6.2, hemoglobin 8.5, MCV 90, platelet  count 136,000.  Glucose 174, BUN 51, creatinine 2.4.  Calcium 8.5.  BNP  581, cardiac markers negative.  Chest x-ray shows evidence of  cardiomegaly and pulmonary edema.  EKG shows sinus rhythm, with normal  axes, evidence of right bundle branch block, mild conduction delay also  noted.  Possible Q wave in lead 1 and aVL.  T-wave changes also noted in  the anterior leads. Few PVCs noted.   ASSESSMENT:  This is a 77 year old African American female who has  diabetes, hypertension, chronic kidney disease who is obese who presents  with a few week history of shortness of breath and actually fell today.  She has evidence of cardiomegaly.  She has EKG abnormalities.  She  has  evidence of pulmonary edema on chest x-ray.  Pulmonary edema is most  likely secondary to a couple of a factors here, one is could be  secondary to the kidney disease and she also could have heart disease  considering her abnormal EKG and cardiomegaly.  She fell because she  stumbled.  All imaging studies done including CT of her C-spine, head  and right shoulder were all negative.   PLAN:  1. Pulmonary edema:  She warrants admission for IV diuretics and      further evaluation.  Echocardiogram will be done and Lasix IV will      be given.  Will hold on the Actos as that can cause congestive      heart failure.  BNP's will be followed.  Will have Dr. Kristian Covey      take a look at her for chronic kidney disease.  Ultrasound of the      kidneys will be checked.  This patient will likely require      evaluation of her coronaries at some point.  2. Chronic kidney disease, stage IV.  Ultrasound will be checked and      will see what Dr. Kristian Covey has to recommend.  Phosphorus level will      be checked as well.  Will obtain records from Dr. Marland Mcalpine office.  3. Anemia of chronic disease.  Iron profile studies will be checked.      Will try to obtain the dose of Procrit that she is supposed to get      this Monday and will give it to her if possible.  Her anemia is not      severe enough to have caused the CHF.  I will hold off on      transfusion for now but it is something to be considered if she      does not improve.  4. Fall.  Because of stumbling, not any other reason.  All imaging      studies have been negative so far.  Pain control will be provided      if needed.  5. Diabetes, type 2.  Hold Actos as it can cause CHF.  Will continue      with glipizide on a lower dose and cover her with sliding scale      insulin as well.  Will check HbA1c as well.  6. Hypertension.  Continue with Norvasc.  7. Deep vein thrombosis with heparin.  8. Mild thrombocytopenia.  This will be monitored  closely.    Further management decisions will depend on the results of further  testing and patient's response to treatment.      Osvaldo Shipper, MD  Electronically Signed     GK/MEDQ  D:  11/14/2008  T:  11/14/2008  Job:  [id number]   cc:   Jorja Loa, M.D.  Fax: [phone]   Garnetta Buddy, M.D.  Fax: [phone]   [name], M.D.  Fax: [phone]   [facility]

## 2011-01-23 NOTE — Letter (Signed)
December 30, 2008    [name], MD  [address]  [place], [place] [address]   RE:  [name], [name]  MRN:  [id number]  /  DOB:  [birth year]   Dear [name],   [name] returns to the office after recent admission to [facility] with congestive heart failure but preserved left ventricular  systolic function.  This occurred in the setting of moderate chronic  kidney disease with baseline creatinine of approximately 2.5 and severe  anemia with an initial hemoglobin of 7.5.  She has felt fine since  hospital discharge.  She is sedentary, but is able to walk short  distances with a cane and cook without difficulty.  She has a Magazine features editor.  She has had no orthopnea or PND.  She had a progressive  increase in creatinine with initial dosing of diuretics.  She was  advised to discontinue hydrochlorothiazide, but did not do so.  She is  very unclear as to how she is taking her medications.  Her diuretic dose  is supposed to be 20 mg of torsemide 5 days per week.  She has been  monitoring weight at home, which has been Farley.   Her other medications include:  1. An iron supplement.  2. Xanax p.r.n.  3. Allegra 180 mg daily.  4. Amlodipine 10 mg daily.  5. Levothyroxine - dose uncertain.  6. Glipizide 10 mg daily.  7. Actos was recently added to her medical regime.  8. She also was given a few pills of Lasix at the time of hospital      discharge for unclear reasons.   PHYSICAL EXAMINATION:  GENERAL:  Very pleasant older woman in no acute  distress.  VITAL SIGNS:  The weight is 239 pounds, Farley.  Blood pressure 135/60,  heart rate 65 and regular, respirations 14 and unlabored.  NECK:  No jugular venous distention; carotid pulsation is visible.  No  carotid bruits.  LUNGS:  Clear.  Mild kyphosis.  CARDIAC:  Normal first heart sound; increased intensity of second heart  sound; grade 2/6 early and midsystolic murmur at the cardiac base.  ABDOMEN:  Soft and nontender; no  organomegaly.  EXTREMITIES:  Edema 1-2+ bilaterally.   IMPRESSION:  [name] is doing generally well.  It is not clear what  diuretic dose she is taking or whether this is more or less than she was  taking before she was admitted with fluid overload.  We will have her  return with all of her medication and try to sort it out for her.  We  will monitor her and renal function closely with a chemistry profile in  6 weeks and a return visit in 4 months.  She is cautioned to call for  any significant weight loss or for recurrent severe edema or dyspnea.   With respect to treatment of diabetes, metformin is obviously not a  consideration.  I would also stay away from Actos in the setting of a  history of congestive heart failure and active edema.  I asked her to  stop this medicine and consult with you about additional drugs that she  might take.  I would consider Prandin or Januvia.    Sincerely,      Gerrit Friends. Dietrich Pates, MD, [facility]  Electronically Signed    RMR/MedQ  DD: 12/30/2008  DT: 12/31/2008  Job #: [id number]

## 2011-01-23 NOTE — Group Therapy Note (Signed)
[name], [name]               ACCOUNT NO.:  [id number]   MEDICAL RECORD NO.:  [id number]          PATIENT TYPE:  INP   LOCATION:  A330                          FACILITY:  [facility]   PHYSICIAN:  Dorris Singh, DO    DATE OF BIRTH:  1931/11/11   DATE OF PROCEDURE:  11/15/2008  DATE OF DISCHARGE:                                 PROGRESS NOTE   The patient seen today, received a call about critical CBC on her.  Her  H and H had gone to 7.5/23.2.  Started to transfuse her today.  She  mentioned a history of having problems with her being anemic in the  past.  She had been seeing a doctor in [place] who had been giving  her shots.   PHYSICAL EXAM:  GENERAL:  She is well developed, well nourished in no  acute distress.  HEART:  Regular rate and rhythm.  LUNGS:  Clear auscultation bilaterally.  ABDOMEN:  Soft, nontender.  EXTREMITIES:  Positive pulses.   Her white count is 7.4.  His hemoglobin prior to transfusion was 7.5 and  hematocrit 23.2, platelet count 123,000.  Her BMP sodium is 141,  potassium 4.6, chloride 114, CO2 20, glucose 114, BUN 65 and creatinine  2.85.  Currently we are seeing a worsening of her creatinine in her BUN  from yesterday.  Also increase in her BNP from yesterday as well.   ASSESSMENT/PLAN:  1. Pulmonary edema.  2. History of chronic kidney disease likely stage IV.  3. Obesity.  4. Hypertension.  5. Diabetes mellitus.  6. Cardiomegaly.  7. Anemia.   PLAN:  Pulmonary edema.  Will check to see if she has a 2-D echo  ordered.  The patient will have a 2-D echo and cardiology consult.  History of kidney disease.  Consult Dr. Kristian Covey and continue to monitor  the patient and change therapy as necessary.  Will keep her on her  medications for diabetes and hypertension.      Dorris Singh, DO  Electronically Signed     CB/MEDQ  D:  11/15/2008  T:  11/15/2008  Job:  [id number]

## 2011-01-23 NOTE — Assessment & Plan Note (Signed)
[facility]                       [name], [name]                      MRN:          [id number]  DATE:11/29/2008                            DOB:          17-Nov-1931    CARDIOLOGIST:  Gerrit Friends. Dietrich Pates, MD, [facility]   PRIMARY CARE PHYSICIAN:  [name], MD   REASON FOR VISIT:  Posthospitalization followup   HISTORY OF PRESENT ILLNESS:  [name] is a 77 year old female with a  history of hypertension, diabetes, chronic kidney disease who was  recently evaluated at [facility] with acute diastolic  congestive heart failure.  [name] had an echocardiogram at Dr. Marland Mcalpine  office in January 2010 that demonstrated normal LV function, mild LVH,  moderate tricuspid regurgitation, and moderate-to-severe pulmonary  hypertension.  [name] was diuresed.  Dr. Dietrich Pates actually had her on  dopamine for a short period of time.  [name] improved and was eventually  discharged to home.  [name] was taking torsemide 20 mg daily p.r.n. prior  to admission.  [name] was discharged home on 40 mg a day for 3 days and is  back on 20 mg a day now.  [name] feels much better.  [name] denies significant  shortness breath.  Denies orthopnea or PND.  [name] denies any chest pain.  Denies any syncope.  [name] does have chronic lower extremity edema without  significant change.   CURRENT MEDICATIONS:  Torsemide 20 mg daily, Poly-iron 150 mg daily,  Xanax 0.5 mg p.r.n., Allegra 180 mg daily, Norvasc 10 mg daily,  Hydrochlorothiazide 25 mg daily,  Synthroid 0.125 mg daily, Glipizide 10 mg daily, Protonix 40 mg daily.   PHYSICAL EXAMINATION:  GENERAL:  [name] is a well-nourished, well-developed  female in no distress.  VITAL SIGNS:  Blood pressure 145/77, pulse 83, weight 239 pounds.  HEENT:  Normal.  NECK:  Without JVD, without lymphadenopathy.  CARDIAC:  Normal S1 and S2.  Regular rate and rhythm.  LUNGS:  Clear to auscultation bilaterally.  No wheezing.   No rales.  ABDOMEN:  Soft, nontender.  EXTREMITIES:  1+ tight edema bilaterally.  NEUROLOGIC:  [name] is alert and oriented x3.  Cranial nerves II through  XII grossly intact.   ASSESSMENT AND PLAN:  1. Chronic diastolic congestive heart failure in the setting of      chronic kidney disease in a patient with moderate-to-severe      pulmonary hypertension by echocardiogram done at Dr. Marland Mcalpine office      in January 2010.  [name] is doing well from a volume standpoint.  [name]      is reporting no significant shortness of breath.  Her weight is      down from 257 pounds at discharge to 239 pounds today.  Because of      her chronic kidney disease, I think [name] will need a daily diuretic      and I have asked her to continue on torsemide 20 mg a day.  We will      follow up with BMET today and a repeat BMET in one week's time.  If  her creatinine starts to go up, we may need to adjust her torsemide      therapy.  2. Dyslipidemia.  [name] had a low HDL in the hospital and her LDL was      suboptimal as [name] does have a coronary risk equivalent with      diabetes mellitus.  [name] is unable to take Lipitor secondary to      significant myalgias.  I have asked her start on fish oil 1 g      daily.  3. Hypertension.  This is mildly uncontrolled.  We will continue to      monitor this and adjust her therapy as needed.  4. Chronic kidney disease.  [name] will continue to follow up with Dr.      Hyman Hopes.  [name] has an appointment with him in May.   DISPOSITION:  The patient will follow up Dr. Dietrich Pates in 1 month or  sooner p.r.n.      Tereso Newcomer, PA-C  Electronically Signed      Jesse Sans. Daleen Squibb, MD, [facility]  Electronically Signed   SW/MedQ  DD: 11/29/2008  DT: 11/30/2008  Job #: [id number]   cc:   [name], M.D.  Garnetta Buddy, M.D.

## 2011-01-26 NOTE — Consult Note (Signed)
NAME:  [name], [name] NO.:  [id number]   MEDICAL RECORD NO.:  [id number]                  PATIENT TYPE:   LOCATION:                                       FACILITY:   PHYSICIAN:  [name], M.D.                 DATE OF BIRTH:  10/15/1931   DATE OF CONSULTATION:  DATE OF DISCHARGE:                                   CONSULTATION   REFERRING PHYSICIAN:  Dr. Regino Farley.   CHIEF COMPLAINT:  Anemia.   HISTORY OF PRESENT ILLNESS:  [name] is a 77 year old African American  female who presents to our office as a referral from Dr.  Regino Farley regarding  normocytic anemia.  She is currently being followed by [name] for her  diabetes and hypertension and was found to have a decreased hemoglobin.  This was noted in January 2004, when her hemoglobin was 10.1 and hematocrit  was 31.9.  Since that point in time, it has now decreased to 9.9 and 32.5  respectively with an MCV of 93.1, despite supplemental iron on a daily  basis.  Overall she reports feeling fine.  She denies any melena or bright  red rectal bleeding.  She denies any abdominal pain, nausea, vomiting or  odynophagia.  She denies any dysphagia, or reflux, or heartburn.  She has no  prior history of colonoscopy.  She does report some mild fatigue and  decreased appetite.  She has a questionable family history of colon  carcinoma in a deceased sister.   PAST MEDICAL HISTORY:  1. Hypertension.  2. Hypothyroidism.  3. Diabetes.  4. Glaucoma.  5. Hypercholesterolemia.  6. Cataract implant.  7. Left leg fracture with fixation.   MEDICATIONS:  1. Travatan 0.004% eye drops q.h.s. to each eye.  2. Hemotene Plus one p.o. b.i.d.  3. Metformin hydrochloride 500 mg p.o. b.i.d.  4. Glipizide 10 mg daily.  5. Alprazolam 0.5 mg one t.i.d. p.r.n.  6. Lipitor 20 mg daily.  7. Synthroid 0.125 daily except two on Sunday.  8. Hydrochlorothiazide 25 mg daily.  9. Torsemide 20 mg daily.  10.       [Sulan] 30 mg daily.   ALLERGIES:  Questionable DYE allergy.   FAMILY HISTORY:  [name] states a questionable history of possible colon  carcinoma in one sister who is deceased.  She also reports a history of CHF  and CVA.  She also has four brothers with diabetes.  Otherwise unremarkable  for GI or liver problems.   SOCIAL HISTORY:  [name] is divorced and lives with her son.  She has  five living children and one was deceased in an automobile accident.  She is  currently retired from a [employer].  She denies any tobacco, alcohol or  drug use.   REVIEW OF SYSTEMS:  CONSTITUTIONAL:  She reports her weight has been stable.  She reports a decreased appetite  and some mild fatigue.  She denies any  fever or chills.  SKIN:  She denies any rash or jaundice.  CARDIOPULMONARY:  She denies any chest pain, palpitations  EXTREMITIES:  She does report  occasional lower extremity edema.  GI:  See HPI.  GU:  She denies any  hematuria, dysuria or increased frequency.  ENDOCRINE:  She reports a  history of diabetes mellitus for which she is being treated by [name].   PHYSICAL EXAMINATION:  VITAL SIGNS:  Temp 97.8, blood pressure 140/78, pulse  84, weight 240.5 pounds, height 66 inches.  GENERAL:  [name] is an obese, African American female in no acute  distress.  She is alert and oriented x 3.  HEENT:  Sclera clear, nonicteric.  Conjunctivae pink.  NECK:  Supple.  No masses or thyromegaly.  CHEST:  Heart regular rate and rhythm without murmurs, clicks, rubs or  gallops.  LUNGS:  Clear to auscultation bilaterally.  ABDOMEN:  Positive obese with some skin breakdown in her skin folds of her  abdomen.  There is some erythema and some cake-like white exudate.  Abdomen  is soft and nontender.  No organomegaly.  No rebound tenderness or guarding.  EXTREMITIES:  Pedal pulses 2+, 1+ ankle edema.   Labs from Dr. Edison Simon office:  December 29, 2002:  Hemoglobin 9.9, hematocrit  32.5, MCV  93.1.  Electrolytes all within normal limits except BUN 38, CO2  18. Hemoglobin A1c 8.0.   ASSESSMENT:  [name] is a 77 year old African American female who  presents to our office with a history of normocytic anemia which has had  questionable effect given b.i.d. Hemotene Plus.  Due to the fact that she  has never had a screening colonoscopy, I figure it would be most appropriate  at this time to proceed with colonoscopy followed by possible upper  esophagogastroduodenoscopy if Dr. Karilyn Cota feels it is warranted.  She should  also continue her iron supplementation.   RECOMMENDATIONS:  1. It is recommended that [name] work on weight reduction.  2. Schedule a colonoscopy with Dr. Karilyn Cota.  If he feels that etiology is     unclear and would like to proceed with EGD, it can be done at that time     as well.  3. I will give her a prescription for Mycostatin to apply to the affected     area t.i.d. for two weeks.  4. We will decrease her oral hypoglycemics prior to colonoscopy.  She was     given instructions on this.  5.     Also, I discussed the procedure, colonoscopy, with [name] and discussed      risks and benefits to include bleeding, perforation and infection.  She     agrees with this plan.  Consent will be obtained.   We would like to thank [name] for this referral.     [name], N.P.                 [name], M.D.    KC/MEDQ  D:  04/30/2003  T:  04/30/2003  Job:  [id number]   cc:   [name], M.D.  [address]  [place]  [place] [address]  Fax: [phone]   [name], M.D.  [address]  [place]  [place] [address]  Fax: [phone]

## 2011-01-26 NOTE — Op Note (Signed)
   [name], [name]                         ACCOUNT NO.:  [id number]   MEDICAL RECORD NO.:  [id number]                   PATIENT TYPE:  AMB   LOCATION:  DAY                                  FACILITY:  [facility]   PHYSICIAN:  R. Roetta Sessions, M.D.              DATE OF BIRTH:  04/19/1932   DATE OF PROCEDURE:  06/30/2003  DATE OF DISCHARGE:                                 OPERATIVE REPORT   PROCEDURE:  Diagnostic esophagogastroduodenoscopy.   INDICATIONS FOR PROCEDURE:  The patient is a 77 year old lady with a history  of chronic anemia and Hemoccult-positive stool.  Prior colonoscopy and small-  bowel follow-through were negative.  EGD is now being done to rule out a  lesion in her upper GI tract.  This approach has been discussed with the  patient at length previously.  The potential risks, benefits, and  alternatives have been reviewed.  Please see my 06/22/2003 H&P for more  information.   PROCEDURE:  O2 saturation, blood pressure, pulses, and respirations were  monitored throughout the entirety of the procedure.  Conscious sedation was  with Versed 3 mg IV, Demerol 50 mg IV in divided doses.  The instrument used  was the Olympus video chip adult gastroscope.   FINDINGS:  Examination of the tubular esophagus revealed no mucosal  abnormalities.  The EG junction was easily traversed.   Stomach:  The gastric cavity was empty and insufflated well with air.  Thorough examination of the gastric mucosa including retroflex view in the  proximal stomach and esophagogastric junction was undertaken.  The patient  was noted to have a J-shaped stomach.  Otherwise, the gastric mucosa  appeared normal.  The pylorus was patent and easily traversed.   Duodenum:  Examination of the bulb and second portion revealed no  abnormalities.   THERAPEUTIC/DIAGNOSTIC MANEUVERS PERFORMED:  None.   The patient tolerated the procedure well and was reactive in endoscopy.   IMPRESSION:  1. Normal  esophagus.  2. J-shaped but otherwise normal-appearing stomach.  3. Normal first and second portions of the duodenum.    RECOMMENDATIONS:  1. Will arrange a Given imaging capsule study down at [facility].  2. Further recommendations to follow.      ___________________________________________                                            Jonathon Bellows, M.D.   RMR/MEDQ  D:  06/30/2003  T:  06/30/2003  Job:  [id number]   cc:   [name], M.D.  [address]  [place]  [place] [address]  Fax: [phone]

## 2011-01-26 NOTE — Op Note (Signed)
[name], [name]                         ACCOUNT NO.:  [id number]   MEDICAL RECORD NO.:  [id number]                   PATIENT TYPE:  AMB   LOCATION:  DAY                                  FACILITY:  [facility]   PHYSICIAN:  R. Roetta Sessions, M.D.              DATE OF BIRTH:  Mar 27, 1932   DATE OF PROCEDURE:  05/19/2003  DATE OF DISCHARGE:                                 OPERATIVE REPORT   PROCEDURE:  Diagnostic colonoscopy.   INDICATION FOR PROCEDURE:  The patient is a 77 year old lady with chronic  anemia.  She has not had any GI bleeding clinically.  In fact, her Hemoccult  status is unknown.  She has had a normal MCV.  She remains anemic with a  hemoglobin and hematocrit of 9.8 and 29.5 today.   This lady is devoid of any GI tract symptoms, no odynophagia, dysphagia,  early satiety, reflux symptoms, nausea, vomiting, abdominal pain, change in  bowel habits, or weight loss.  She has never had her lower GI tract  evaluated.  Colonoscopy is now being done.  This approach has been discussed  with the patient previously.  The potential risks, benefits, and  alternatives have been reviewed, questions answered, and she is agreeable.  Please see the documentation in the medical record with the dictated  consultation of Jan 28, 2003.   PROCEDURE NOTE:  O2 saturation, blood pressure, pulse, and respirations were  monitored throughout the entire procedure.   CONSCIOUS SEDATION:  Versed 3 mg IV, Demerol 50 mg IV in divided doses.   INSTRUMENT USED:  Olympus video chip adult colonoscope.   FINDINGS:  Digital rectal exam revealed no abnormalities.   Endoscopic findings:  Unfortunately, the prep was marginal to poor.   Rectum:  Examination of the rectal mucosa including retroflexed view of the  anal verge revealed no abnormalities.   Colon:  The colonic mucosa was surveyed from the rectosigmoid junction  through the left, transverse, and right colon, to the area of the  appendiceal  orifice, ileocecal valve, and cecum.  These structures were well-  seen and photographed for the record.  The patient's colon was elongated and  tortuous.  It took a number of maneuvers including external abdominal  pressure and changing of the patient's position to reach the cecum.  There  was also quite a bit of liquid stool throughout her colon, which was  somewhat viscous and loaded with seeds.  This caused clogging of the scope  on several occasions requiring scope maintenance during the procedure.  The  patient was noted to have scattered left-sided diverticula.  The remainder  of the colonic mucosa and the cecum appeared normal.  From this level the  scope was slowly withdrawn and all previously-mentioned mucosal surfaces  were again seen, and no additional abnormalities were observed.  The patient  tolerated the procedure well and was reacted in endoscopy.   IMPRESSION:  1. Normal rectum.  2. Left-sided diverticula.  3. Long, redundant, capacious colon.  4. Marginal to poor prep made the exam more difficult.  5. No gross colonic mucosal abnormalities seen today.   At this point in time the patient has a chronic anemia.  She has not been  documented to have any evidence of gastrointestinal bleeding as far as I am  aware at this time.  In fact, she has not been documented to be iron-  deficient at this time.   RECOMMENDATIONS:  1. A fasting iron, iron-binding capacity, and ferritin today.  2. Will have her return three Hemoccult cards next week.   Will make further recommendations after the above-mentioned studies are  available for review.                                               [name], M.D.    RMR/MEDQ  D:  05/19/2003  T:  05/19/2003  Job:  [id number]   cc:   Corrie Mckusick, M.D.  [address] [place]urell Josephs. A  [place]  [place] [address]  Fax: [phone]

## 2011-01-26 NOTE — H&P (Signed)
NAMEMC, HOLLEN                         ACCOUNT NO.:  [id number]   MEDICAL RECORD NO.:  [id number]                   PATIENT TYPE:   LOCATION:                                       FACILITY:  [facility]   PHYSICIAN:  R. Roetta Sessions, M.D.              DATE OF BIRTH:  [birth year]   DATE OF ADMISSION:  06/22/2003  DATE OF DISCHARGE:                                HISTORY & PHYSICAL   CHIEF COMPLAINT:  History of anemia, blood in stool.   HISTORY OF PRESENT ILLNESS:  [name] is a 77 year old black female who  underwent a diagnostic colonoscopy on May 19, 2003 given a history of  chronic anemia.  She was found to have a left-sided diverticula, had a  marginal to poor prep but had no gross colonic mucosal abnormalities seen.  It was unclear whether she had had any documented GI bleeding.  Hemoccult  cards were obtained, three of three were positive.  She also had a small  bowel series which revealed non-rotation of the bowel with colon in the left  abdomen.  Small bowel was in the right.  Otherwise normal.  The patient  presents today stating that she has had chronic constipation.  Since her  colonoscopy, however, her bowel has been moving fairly regularly.  She uses  magnesium citrate, apple juice, and Benefiber p.r.n.  She denies any  abdominal pain, melena, rectal bleeding, nausea and vomiting, heartburn,  dysphasia, or odynophagia.   CURRENT MEDICATIONS:  1. Travatan 0.004% 1 drop each eye q.h.s.  2. Metformin HCl 500 mg b.i.d.  3. Glipizide 10 mg daily.  4. Alprazolam 0.5 mg 1 t.i.d.  5. Lipitor 20 mg daily.  6. Synthroid 0.125 mg daily and 2 on Sunday.  7. Sular 30 mg daily.  8. Hydrochlorothiazide 25 mg daily.  9. Torsemide 20 mg daily.  10.      Benefiber daily.  11.      Magnesium citrate and milk of magnesium p.r.n.   ALLERGIES:  Questionable DYE allergy.   PAST MEDICAL HISTORY:  1. Hypertension.  2. Hypothyroidism.  3. Diabetes mellitus.  4. Glaucoma.  5. Hypercholesterolemia.  6. Cataract implant.  7. Left leg fracture with fixation.   FAMILY HISTORY:  Possible colon cancer.  One sister is deceased.  Four  brothers with diabetes mellitus.   SOCIAL HISTORY:  She is divorced and lives with her son.  She has five  living children and one deceased in an automobile accident.  She is retired  from the [employer].  She denies any tobacco or alcohol use.   REVIEW OF SYSTEMS:  GASTROINTESTINAL:  Please see HPI.  GENERAL:  Denies any  weight loss.  CARDIOPULMONARY:  Denies any chest pain or shortness of  breath.   PHYSICAL EXAMINATION:  VITAL SIGNS:  Weight 237 pounds, blood pressure  120/64, pulse 82.  GENERAL:  A pleasant well-developed well-nourished  white female in no acute  distress.  SKIN:  Warm and dry.  No jaundice.  HEENT:  Conjunctivae pink.  Sclerae nonicteric.  Oropharyngeal mucosa moist  and pink.  LUNGS:  Clear to auscultation.  CARDIAC:  Reveals regular rate and rhythm.  Normal S1 S2.  No murmurs, rubs,  or gallops.  ABDOMEN:  Positive bowel sounds.  Soft, nontender, nondistended.  No  organomegaly or masses.  EXTREMITIES:  No edema.   IMPRESSION:  Ms. Pester is a 77 year old lady with a history of chronic  anemia and Hemoccult positive stools.  Prior colonoscopy and small bowel  follow through as outlined above.  Now that she has had documented Hemoccult  positive stools, we need to further assess her gastrointestinal bleeding by  looking at her upper gastrointestinal tract.  She may ultimately need to  have Givens capsule study if upper endoscopy is unremarkable.   PLAN:  1. EGD in the near future.  2. We will obtain another hemoglobin and hematocrit at the time of     endoscopy.     _____________________________________  ___________________________________________  [place], P.AJonathon Bellows, M.D.   LL/MEDQ  D:  06/22/2003  T:  06/22/2003  Job:  [id number]   cc:   [name], M.D.  [address]  [place]  [place] [address]  Fax: [phone]

## 2011-02-09 DIAGNOSIS — Z8701 Personal history of pneumonia (recurrent): Secondary | ICD-10-CM

## 2011-02-09 HISTORY — DX: Personal history of pneumonia (recurrent): Z87.01

## 2011-02-10 ENCOUNTER — Inpatient Hospital Stay (HOSPITAL_COMMUNITY)
Admission: EM | Admit: 2011-02-10 | Discharge: 2011-02-13 | DRG: 194 | Disposition: A | Discharge status: Home Health | Payer: PRIVATE HEALTH INSURANCE | Attending: Internal Medicine | Admitting: Internal Medicine

## 2011-02-10 ENCOUNTER — Emergency Department (HOSPITAL_COMMUNITY): Payer: PRIVATE HEALTH INSURANCE

## 2011-02-10 DIAGNOSIS — E119 Type 2 diabetes mellitus without complications: Secondary | ICD-10-CM | POA: Diagnosis present

## 2011-02-10 DIAGNOSIS — N390 Urinary tract infection, site not specified: Secondary | ICD-10-CM | POA: Diagnosis present

## 2011-02-10 DIAGNOSIS — N039 Chronic nephritic syndrome with unspecified morphologic changes: Secondary | ICD-10-CM | POA: Diagnosis present

## 2011-02-10 DIAGNOSIS — D631 Anemia in chronic kidney disease: Secondary | ICD-10-CM | POA: Diagnosis present

## 2011-02-10 DIAGNOSIS — I509 Heart failure, unspecified: Secondary | ICD-10-CM | POA: Diagnosis present

## 2011-02-10 DIAGNOSIS — E039 Hypothyroidism, unspecified: Secondary | ICD-10-CM | POA: Diagnosis present

## 2011-02-10 DIAGNOSIS — J189 Pneumonia, unspecified organism: Principal | ICD-10-CM | POA: Diagnosis present

## 2011-02-10 DIAGNOSIS — F411 Generalized anxiety disorder: Secondary | ICD-10-CM | POA: Diagnosis present

## 2011-02-10 DIAGNOSIS — N184 Chronic kidney disease, stage 4 (severe): Secondary | ICD-10-CM | POA: Diagnosis present

## 2011-02-10 DIAGNOSIS — I129 Hypertensive chronic kidney disease with stage 1 through stage 4 chronic kidney disease, or unspecified chronic kidney disease: Secondary | ICD-10-CM | POA: Diagnosis present

## 2011-02-10 DIAGNOSIS — I5032 Chronic diastolic (congestive) heart failure: Secondary | ICD-10-CM | POA: Diagnosis present

## 2011-02-10 DIAGNOSIS — E876 Hypokalemia: Secondary | ICD-10-CM | POA: Diagnosis not present

## 2011-02-10 LAB — CBC
HCT: 31.9 % — ABNORMAL LOW (ref 36.0–46.0)
Hemoglobin: 10.3 g/dL — ABNORMAL LOW (ref 12.0–15.0)
MCH: 28.4 pg (ref 26.0–34.0)
MCHC: 32.3 g/dL (ref 30.0–36.0)
MCV: 87.9 fL (ref 78.0–100.0)
Platelets: 150 10*3/uL (ref 150–400)
RBC: 3.63 MIL/uL — ABNORMAL LOW (ref 3.87–5.11)
RDW: 15 % (ref 11.5–15.5)
WBC: 13 10*3/uL — ABNORMAL HIGH (ref 4.0–10.5)

## 2011-02-10 LAB — DIFFERENTIAL
Basophils Absolute: 0 10*3/uL (ref 0.0–0.1)
Basophils Relative: 0 % (ref 0–1)
Eosinophils Absolute: 0 10*3/uL (ref 0.0–0.7)
Eosinophils Relative: 0 % (ref 0–5)
Lymphocytes Relative: 7 % — ABNORMAL LOW (ref 12–46)
Lymphs Abs: 0.9 10*3/uL (ref 0.7–4.0)
Monocytes Absolute: 0.7 10*3/uL (ref 0.1–1.0)
Monocytes Relative: 6 % (ref 3–12)
Neutro Abs: 11.3 10*3/uL — ABNORMAL HIGH (ref 1.7–7.7)
Neutrophils Relative %: 87 % — ABNORMAL HIGH (ref 43–77)

## 2011-02-10 LAB — URINALYSIS, ROUTINE W REFLEX MICROSCOPIC
Bilirubin Urine: NEGATIVE
Glucose, UA: NEGATIVE mg/dL
Ketones, ur: NEGATIVE mg/dL
Leukocytes, UA: NEGATIVE
Nitrite: NEGATIVE
Protein, ur: 100 mg/dL — AB
pH: 5.5 (ref 5.0–8.0)

## 2011-02-10 LAB — COMPREHENSIVE METABOLIC PANEL
ALT: 9 U/L (ref 0–35)
AST: 26 U/L (ref 0–37)
Alkaline Phosphatase: 125 U/L — ABNORMAL HIGH (ref 39–117)
CO2: 23 mEq/L (ref 19–32)
Chloride: 98 mEq/L (ref 96–112)
GFR calc Af Amer: 17 mL/min — ABNORMAL LOW (ref >60)
GFR calc non Af Amer: 14 mL/min — ABNORMAL LOW (ref >60)
Glucose, Bld: 133 mg/dL — ABNORMAL HIGH (ref 70–99)
Potassium: 3.6 mEq/L (ref 3.5–5.1)
Sodium: 134 mEq/L — ABNORMAL LOW (ref 135–145)

## 2011-02-10 LAB — URINE MICROSCOPIC-ADD ON

## 2011-02-11 LAB — DIFFERENTIAL
Eosinophils Absolute: 0 10*3/uL (ref 0.0–0.7)
Lymphocytes Relative: 5 % — ABNORMAL LOW (ref 12–46)
Lymphs Abs: 0.5 10*3/uL — ABNORMAL LOW (ref 0.7–4.0)
Monocytes Relative: 6 % (ref 3–12)
Neutrophils Relative %: 89 % — ABNORMAL HIGH (ref 43–77)

## 2011-02-11 LAB — HEMOGLOBIN A1C
Hgb A1c MFr Bld: 6.5 % — ABNORMAL HIGH (ref <5.7)
Mean Plasma Glucose: 140 mg/dL — ABNORMAL HIGH (ref <117)

## 2011-02-11 LAB — BASIC METABOLIC PANEL
BUN: 79 mg/dL — ABNORMAL HIGH (ref 6–23)
CO2: 23 mEq/L (ref 19–32)
Chloride: 102 mEq/L (ref 96–112)
Creatinine, Ser: 3.28 mg/dL — ABNORMAL HIGH (ref 0.4–1.2)
Glucose, Bld: 238 mg/dL — ABNORMAL HIGH (ref 70–99)
Potassium: 3.4 mEq/L — ABNORMAL LOW (ref 3.5–5.1)

## 2011-02-11 LAB — GLUCOSE, CAPILLARY: Glucose-Capillary: 174 mg/dL — ABNORMAL HIGH (ref 70–99)

## 2011-02-11 LAB — CBC
HCT: 30 % — ABNORMAL LOW (ref 36.0–46.0)
MCH: 28.1 pg (ref 26.0–34.0)
MCV: 87.7 fL (ref 78.0–100.0)
RBC: 3.42 MIL/uL — ABNORMAL LOW (ref 3.87–5.11)
WBC: 12.1 10*3/uL — ABNORMAL HIGH (ref 4.0–10.5)

## 2011-02-11 NOTE — H&P (Signed)
[name], [name]               ACCOUNT NO.:  [id number]  MEDICAL RECORD NO.:  [id number]           PATIENT TYPE:  I  LOCATION:  A330                          FACILITY:  [facility]  PHYSICIAN:  Vania Rea, M.D. DATE OF BIRTH:  1932/06/26  DATE OF ADMISSION:  02/10/2011 DATE OF DISCHARGE:  LH                             HISTORY & PHYSICAL   PRIMARY CARE PHYSICIAN:  [name], MD  NEPHROLOGIST:  Garnetta Buddy, MD  CARDIOLOGIST:  Gerrit Friends. Dietrich Pates, MD, [facility]  CHIEF COMPLAINT:  Malaise and lethargy for the past week.  HISTORY OF PRESENT ILLNESS:  This is a 77 year old African American lady with multiple medical problems including diabetes, hypertension, and chronic kidney disease who lives at home with her disabled son.  They look after each other.  She usually ambulates with the assistance of a cane, but her daughter who accompanies her to the emergency room reports that for the past week she has noticed that talking to her over the phone she seems very weak and somewhat lethargic and yesterday, she seemed somewhat confused.  The patient reports that she has been increasingly weak.  She has not been eating well.  She has been having increasing cough and since yesterday has been having fever and chills. She has had no shortness of breath or chest pain.  She has had no change in urinary symptoms.  No abdominal pain or vomiting.  She has had some nausea.  She has not been exposed to anybody with respiratory illness and has been having no runny nose or stuffiness.  PAST MEDICAL HISTORY: 1. Diabetes. 2. Hypertension. 3. Chronic kidney disease, stage IV. 4. Anemia. 5. Anxiety. 6. Hypothyroidism. 7. Diastolic heart failure. 8. Obesity.  PAST SURGICAL HISTORY:  Open reduction and internal fixation of left leg fracture secondary to trauma in the 1970s.  MEDICATIONS:  The patient is unsure of all of her medications and reports that her nephrologist made some  adjustments to her medications about 3 days ago, but she is unsure of the exact details; however, she believes they include:  Xanax four times daily; glipizide 2.5 mg; calcitriol 0.25 mg Monday, Wednesday, and Friday; Norvasc 10 mg daily; levothyroxine 100 mcg daily; fish oil; torsemide 20-40 mg daily; Integra.  ALLERGIES:  IVP contrast media.  SOCIAL HISTORY:  Denies any history of tobacco, alcohol, or illicit drug use.  She is a retired Scientist, product/process development.  FAMILY HISTORY:  Significant for coronary artery disease in mother and her siblings, atherosclerosis in her father.  REVIEW OF SYSTEMS:  Other than noted above, significant only for persistent shaking which is new over the past few days.  PHYSICAL EXAMINATION:  GENERAL:  A pleasant elderly but ill-looking African American lady, lying on the stretcher. VITAL SIGNS:  Temperature is 101.1.  Her pulse is 71, respirations 20, blood pressure 122/47.  She is saturating 95% on room air. HEENT:  Her pupils are round, equal.  Mucous membranes pink.  Anicteric. No cervical lymphadenopathy.  She is mildly dehydrated.  No thyromegaly or carotid bruit. CHEST:  She has diminished breath sounds in the left upper zone. CARDIOVASCULAR:  Regular rhythm.  No murmur. ABDOMEN:  Obese, soft, nontender. EXTREMITIES:  Without edema.  She has arthritic deformities of the knees and ankles. CENTRAL NERVOUS SYSTEM:  Cranial nerves II through XII are grossly intact.  She has no focal lateralizing signs.  LABORATORY DATA:  White count is elevated to 13,000, hemoglobin 10.3, platelets 150, 87% neutrophils, 11.3 is absolute granulocyte count.  Her sodium is low at 134, potassium 3.6, chloride 98, CO2 is 23, glucose 133, BUN elevated at 78, creatinine is 3.2.  She reportedly has a baseline creatinine of 2.1.  Her alk phos is elevated at 125, total protein elevated at 8.6, albumin 3.1, calcium 9.1.  Urinalysis shows cloudy urine, small amount of blood, 100  proteins, negative for nitrites or leukocyte esterase.  Urine microscopy shows 7-10 white blood cells, 7- 10 red cells, and many bacteria.  Two-view chest x-ray shows parenchymal opacity in the medial left upper lobe, most consistent with pneumonia.  ASSESSMENT: 1. Community-acquired pneumonia. 2. Dehydration/acute-on-chronic renal failure, associated with     pneumonia. 3. Diabetes, type 2. 4. Hypertension. 5. Hypothyroidism. 6. History of anxiety. 7. Chronic anemia.  PLAN:  We will admit this lady to continue intravenous antibiotics therapy.  Blood cultures have already been done.  We will give symptomatic treatment for malaise and fever and cough.  We will hold her Lasix and hydrate her gently for 12 hours.  We will hold her oral antidiabetic medications and give sliding scale insulin for the first 24 hours.  Other plans as per orders.     Vania Rea, M.D.     LC/MEDQ  D:  02/10/2011  T:  02/10/2011  Job:  [id number]  cc:   Gerrit Friends. Dietrich Pates, MD, [facility] [address] [place], [place] [address]  [name], M.D. Fax: [phone]  Garnetta Buddy, M.D. Fax: [phone]  Electronically Signed by Vania Rea M.D. on 02/11/2011 05:45:35 AM

## 2011-02-12 LAB — DIFFERENTIAL
Basophils Absolute: 0 10*3/uL (ref 0.0–0.1)
Basophils Relative: 0 % (ref 0–1)
Lymphocytes Relative: 11 % — ABNORMAL LOW (ref 12–46)
Monocytes Absolute: 0.5 10*3/uL (ref 0.1–1.0)
Neutro Abs: 7.8 10*3/uL — ABNORMAL HIGH (ref 1.7–7.7)
Neutrophils Relative %: 83 % — ABNORMAL HIGH (ref 43–77)

## 2011-02-12 LAB — CBC
HCT: 29.4 % — ABNORMAL LOW (ref 36.0–46.0)
Hemoglobin: 9.3 g/dL — ABNORMAL LOW (ref 12.0–15.0)
MCHC: 31.6 g/dL (ref 30.0–36.0)

## 2011-02-12 LAB — BASIC METABOLIC PANEL
CO2: 22 mEq/L (ref 19–32)
Calcium: 8.3 mg/dL — ABNORMAL LOW (ref 8.4–10.5)
Chloride: 107 mEq/L (ref 96–112)
Glucose, Bld: 125 mg/dL — ABNORMAL HIGH (ref 70–99)
Sodium: 138 mEq/L (ref 135–145)

## 2011-02-12 LAB — GLUCOSE, CAPILLARY
Glucose-Capillary: 133 mg/dL — ABNORMAL HIGH (ref 70–99)
Glucose-Capillary: 154 mg/dL — ABNORMAL HIGH (ref 70–99)

## 2011-02-12 LAB — T4, FREE: Free T4: 0.95 ng/dL (ref 0.80–1.80)

## 2011-02-12 LAB — TSH: TSH: 1.529 u[IU]/mL (ref 0.350–4.500)

## 2011-02-13 ENCOUNTER — Inpatient Hospital Stay (HOSPITAL_COMMUNITY): Payer: PRIVATE HEALTH INSURANCE

## 2011-02-13 LAB — BASIC METABOLIC PANEL
CO2: 19 mEq/L (ref 19–32)
Chloride: 108 mEq/L (ref 96–112)
GFR calc Af Amer: 14 mL/min — ABNORMAL LOW (ref >60)
Potassium: 4.2 mEq/L (ref 3.5–5.1)
Sodium: 140 mEq/L (ref 135–145)

## 2011-02-13 LAB — URINE CULTURE: Culture  Setup Time: 201206032135

## 2011-02-13 LAB — GLUCOSE, CAPILLARY
Glucose-Capillary: 107 mg/dL — ABNORMAL HIGH (ref 70–99)
Glucose-Capillary: 193 mg/dL — ABNORMAL HIGH (ref 70–99)

## 2011-02-17 NOTE — Discharge Summary (Signed)
[name], [name]               ACCOUNT NO.:  [id number]  MEDICAL RECORD NO.:  [id number]  LOCATION:  A330                          FACILITY:  [facility]  PHYSICIAN:  Elliot Cousin, M.D.    DATE OF BIRTH:  1932/04/26  DATE OF ADMISSION:  02/10/2011 DATE OF DISCHARGE:  06/05/2012LH                              DISCHARGE SUMMARY   DISCHARGE DIAGNOSES: 1. Community-acquired pneumonia. 2. Urinary tract infection. 3. Type 2 diabetes mellitus.  The patient's hemoglobin A1c was 6.5. 4. Stage IV chronic kidney disease.  The patient's BUN was 84 and her     creatinine was 3.78 at the time of discharge. 5. Anemia of chronic kidney disease.  The patient's hemoglobin was 9.3     at the time of discharge. 6. Hypertension. 7. Hypothyroidism.  The patient's TSH was within normal limits at 1.5     and her free T4 was within normal limits at 0.95. 8. Chronic diastolic heart failure, remained stable and compensated. 9. Mild deconditioning.  Home health physical therapy was ordered.  DISCHARGE MEDICATIONS: 1. Avelox 400 mg daily for five more days. 2. Albuterol inhaler two puffs three times daily as needed for     wheezing and shortness of breath/chest congestion. 3. Calcitriol 0.25 mcg one capsule every Monday, Wednesday and Friday. 4. Fish oil 1000 mg daily. 5. Glipizide XL 2.5 mg daily.  The patient was instructed to not take     this medication if her blood sugar was below 125. 6. Integra plus vitamin once daily. 7. Levothyroxine 100 mcg daily. 8. Norvasc 10 mg daily. 9. Torsemide 20 mg tablet.  The patient takes one tablet daily and     increases it to two tablets daily if she gains more than 2 pounds. 10.Xanax 0.5 mg daily.  DISCHARGE DISPOSITION:  The patient was discharged to home in improved and stable condition on February 13, 2011.  She will follow up with her primary care physician Dr. Regino Schultze on February 19, 2011 at 10 o'clock a.m. She will follow up with Dr. Hyman Hopes next week as previously  scheduled.  CONSULTATIONS:  None.  PROCEDURE PERFORMED:  Chest x-ray on February 13, 2011.  The results revealed persistent left upper lobe airspace opacity consistent with pneumonia.  HISTORY OF PRESENTING ILLNESS:  The patient is a 77 year old woman with a past medical history significant for stage IV chronic kidney disease, hypertension, diastolic heart failure, and type 2 diabetes mellitus. She presented to the emergency department on February 10, 2011 with a chief complaint of malaise, lethargy, and a cough.  In the emergency department, the patient was noted to be febrile with a temperature 101.1.  She was otherwise hemodynamically stable.  She was oxygenating 95% on nasal cannula oxygen.  Her lab data were significant for white blood cell count of 13.0, hemoglobin of 10.3, BUN of 78, and creatinine of 3.2.  Her urinalysis revealed 7-10 wbc's, 7-10 rbc's, and many bacteria.  Her chest x-ray revealed parenchymal opacity in the medial left upper lobe consistent with pneumonia.  She was admitted for further evaluation and management.  HOSPITAL COURSE: 1. Community-acquired pneumonia and urinary tract infection.  The     patient was  started empirically on intravenous Avelox.  Because she     remained febrile on Avelox, Rocephin was added for additional     coverage for pneumonia and the urinary tract infection.  She was     treated symptomatically with oxygen titrated to keep her oxygen     saturations greater than 90%.  Eventually bronchodilator therapy     with albuterol was added for occasional bronchospasms and ongoing     pulmonary crackles.  She became afebrile.  Her white blood cell     count which was 13,000 on admission, normalized to 9.4.  Her urine     culture was essentially negative.  She received 2-1/2 days of     antibiotic therapy.  She was discharged on five more days of oral     Avelox. 2. Stage IV chronic kidney disease.  Torsemide was initially withheld     during the  time that the patient was hydrated.  Initially, it was     felt that she may have had an element of acute renal failure.  Her     BUN was 78 and her creatinine was 3.20 on admission.  However,     following gentle IV fluids, her renal function did not improve.     Therefore, the IV fluids were discontinued and torsemide was     restarted.  At the time of discharge, her BUN was 84 and a     creatinine was 3.78.  Her urine output was well within normal     limits. 3. Type 2 diabetes mellitus.  The patient's diabetes was managed with     sliding scale NovoLog.  Glipizide was withheld in the hospital     setting.  Her hemoglobin A1c was noted to be 6.5.  She was advised     to resume glipizide with parameters following hospital discharge. 4. Anemia of chronic disease.  She is followed by     nephrologist, Dr. Hyman Hopes.  According to the patient, she     receives a shot of some sort weekly or every other week.  This may     be Epogen or Aranesp.  Nevertheless, she has been diagnosed with     anemia of chronic kidney disease.  Her hemoglobin was 9.3 at the     time of discharge.  It fell slightly due to the dilutional effects     of IV fluids.     Elliot Cousin, M.D.     DF/MEDQ  D:  02/13/2011  T:  02/14/2011  Job:  [id number]  cc:   [name], M.D. Fax: [phone]  Garnetta Buddy, M.D. Fax: [phone]  Gerrit Friends. Dietrich Pates, MD, [facility] [address] [place], [place] [address]  Electronically Signed by Elliot Cousin M.D. on 02/17/2011 05:08:08 PM

## 2011-04-27 ENCOUNTER — Other Ambulatory Visit (HOSPITAL_COMMUNITY): Payer: Self-pay | Admitting: Family Medicine

## 2011-04-27 DIAGNOSIS — E119 Type 2 diabetes mellitus without complications: Secondary | ICD-10-CM

## 2011-04-27 DIAGNOSIS — N189 Chronic kidney disease, unspecified: Secondary | ICD-10-CM

## 2011-04-27 DIAGNOSIS — I10 Essential (primary) hypertension: Secondary | ICD-10-CM

## 2011-05-07 ENCOUNTER — Ambulatory Visit (HOSPITAL_COMMUNITY)
Admission: RE | Admit: 2011-05-07 | Discharge: 2011-05-07 | Disposition: A | Discharge status: Home / Self Care | Payer: PRIVATE HEALTH INSURANCE | Source: Ambulatory Visit | Attending: Family Medicine | Admitting: Family Medicine

## 2011-05-07 DIAGNOSIS — I10 Essential (primary) hypertension: Secondary | ICD-10-CM

## 2011-05-07 DIAGNOSIS — E119 Type 2 diabetes mellitus without complications: Secondary | ICD-10-CM | POA: Insufficient documentation

## 2011-05-07 DIAGNOSIS — L97509 Non-pressure chronic ulcer of other part of unspecified foot with unspecified severity: Secondary | ICD-10-CM | POA: Insufficient documentation

## 2011-05-07 DIAGNOSIS — N189 Chronic kidney disease, unspecified: Secondary | ICD-10-CM | POA: Insufficient documentation

## 2011-07-13 ENCOUNTER — Encounter: Payer: Self-pay | Admitting: *Deleted

## 2011-07-13 ENCOUNTER — Encounter: Payer: Self-pay | Admitting: Cardiology

## 2011-07-17 ENCOUNTER — Encounter: Payer: Self-pay | Admitting: Cardiology

## 2011-07-19 ENCOUNTER — Ambulatory Visit (INDEPENDENT_AMBULATORY_CARE_PROVIDER_SITE_OTHER): Payer: PRIVATE HEALTH INSURANCE | Admitting: Cardiology

## 2011-07-19 ENCOUNTER — Encounter: Payer: Self-pay | Admitting: Cardiology

## 2011-07-19 DIAGNOSIS — N039 Chronic nephritic syndrome with unspecified morphologic changes: Secondary | ICD-10-CM

## 2011-07-19 DIAGNOSIS — R0989 Other specified symptoms and signs involving the circulatory and respiratory systems: Secondary | ICD-10-CM

## 2011-07-19 DIAGNOSIS — I10 Essential (primary) hypertension: Secondary | ICD-10-CM

## 2011-07-19 DIAGNOSIS — N189 Chronic kidney disease, unspecified: Secondary | ICD-10-CM | POA: Insufficient documentation

## 2011-07-19 DIAGNOSIS — E119 Type 2 diabetes mellitus without complications: Secondary | ICD-10-CM

## 2011-07-19 DIAGNOSIS — E039 Hypothyroidism, unspecified: Secondary | ICD-10-CM

## 2011-07-19 DIAGNOSIS — E663 Overweight: Secondary | ICD-10-CM

## 2011-07-19 DIAGNOSIS — N184 Chronic kidney disease, stage 4 (severe): Secondary | ICD-10-CM | POA: Insufficient documentation

## 2011-07-19 DIAGNOSIS — D631 Anemia in chronic kidney disease: Secondary | ICD-10-CM

## 2011-07-19 NOTE — Assessment & Plan Note (Signed)
Patient was euthyroid when last tested 5 months ago.

## 2011-07-19 NOTE — Assessment & Plan Note (Addendum)
Hemoglobin is moderately depressed as of the most recent measurement.  An increase in ESA therapy may be warranted. Patient has an upcoming visit with Dr. Hyman Hopes who is managing this issue.

## 2011-07-19 NOTE — Assessment & Plan Note (Signed)
Not appreciated at this visit.  Minimal disease was identified on carotid ultrasound in 2010.  In the absence of symptoms or more impressive physical findings, additional testing will probably not be necessary.

## 2011-07-19 NOTE — Assessment & Plan Note (Signed)
Hemoglobin A1c of 6.5 in 02/2011.  Adequate control with current medical regime.

## 2011-07-19 NOTE — Patient Instructions (Signed)
Your physician recommends that you schedule a follow-up appointment in: 10 months with Dr Dietrich Pates.  You will receive a reminder letter in the mail.

## 2011-07-19 NOTE — Assessment & Plan Note (Signed)
Renal disease is progressive.  Patient's daughter requires dialysis, so she is familiar with the process.  Dr. Hyman Hopes is doing an excellent job at maintaining the modest renal dysfunction that remains.

## 2011-07-19 NOTE — Assessment & Plan Note (Addendum)
Blood pressure control is marginal.  An additional antihypertensive agent, either a beta blocker or ACE inhibitor can be considered.  The latter may helped preserve long-term renal function.  I will defer to Dr. Hyman Hopes to address this issue at the patient's upcoming office visit.

## 2011-07-19 NOTE — Progress Notes (Signed)
HPI : [name] returns to the office as scheduled for continued assessment and treatment of hypertension, stage IV chronic kidney disease, and hypertensive heart disease with a history of congestive heart failure.  Since her last visit, she has done fairly well.  She was admitted to the hospital 4 months ago with pneumonia thought secondary to aspiration.  She has done well since with no swallowing difficulty.  She has not been able to follow a renal diet as well as she would like as the result of her limited financial means.  She uses food stamps and receives supplies from local food banks and thus cannot choose the items that she prefers to eat.  She will wash canned vegetables to remove the salt, but also uses salt on her food.  Current Outpatient Prescriptions on File Prior to Visit  Medication Sig Dispense Refill  . ALPRAZolam (XANAX) 0.5 MG tablet Take 0.5 mg by mouth every 6 (six) hours.        Marland Kitchen amLODipine (NORVASC) 10 MG tablet Take 10 mg by mouth daily.        . calcitRIOL (ROCALTROL) 0.25 MCG capsule Take 0.25 mcg by mouth every other day.        . fish oil-omega-3 fatty acids 1000 MG capsule Take 1 capsule by mouth daily.        Marland Kitchen glipiZIDE (GLUCOTROL XL) 2.5 MG 24 hr tablet Take 2.5 mg by mouth daily.        Marland Kitchen levothyroxine (SYNTHROID, LEVOTHROID) 100 MCG tablet Take 100 mcg by mouth daily.        Marland Kitchen torsemide (DEMADEX) 20 MG tablet Take 20 mg by mouth daily.           Allergies  Allergen Reactions  . Iodinated Diagnostic Agents       Past medical history, social history, and family history reviewed and updated.  ROS: Denies dyspnea on exertion, chest discomfort, orthopnea, PND or syncope.  PHYSICAL EXAM: BP 145/73  Pulse 74  Ht 5\' 6"  (1.676 m)  Wt 110.678 kg (244 lb)  BMI 39.38 kg/m2  SpO2 98%; 15 pound weight gain since her last visit 9 months ago General-Well developed; no acute distress; hematoma over the medial right orbit; no tenderness; mild swelling over the right  orbital ridge; EOMs full; vision normal Body habitus-obese Neck-No JVD; no carotid bruits Lungs-clear lung fields; resonant to percussion Cardiovascular-normal PMI; normal S1 and slightly accentuated S2; minimal systolic murmur Abdomen-normal bowel sounds; soft and non-tender without masses or organomegaly Musculoskeletal-No deformities, no cyanosis or clubbing Neurologic-Normal cranial nerves; symmetric strength and tone Skin-Warm, no significant lesions Extremities-distal pulses intact; 1+ ankle edema:   ASSESSMENT AND PLAN:

## 2011-10-02 ENCOUNTER — Other Ambulatory Visit (HOSPITAL_COMMUNITY): Payer: Self-pay | Admitting: Family Medicine

## 2011-10-02 DIAGNOSIS — Z139 Encounter for screening, unspecified: Secondary | ICD-10-CM

## 2011-10-04 ENCOUNTER — Ambulatory Visit (HOSPITAL_COMMUNITY)
Admission: RE | Admit: 2011-10-04 | Discharge: 2011-10-04 | Disposition: A | Discharge status: Home / Self Care | Payer: PRIVATE HEALTH INSURANCE | Source: Ambulatory Visit | Attending: Family Medicine | Admitting: Family Medicine

## 2011-10-04 DIAGNOSIS — M818 Other osteoporosis without current pathological fracture: Secondary | ICD-10-CM | POA: Insufficient documentation

## 2011-10-04 DIAGNOSIS — Z139 Encounter for screening, unspecified: Secondary | ICD-10-CM

## 2011-10-04 DIAGNOSIS — Z13820 Encounter for screening for osteoporosis: Secondary | ICD-10-CM | POA: Insufficient documentation

## 2011-10-04 DIAGNOSIS — Z780 Asymptomatic menopausal state: Secondary | ICD-10-CM | POA: Insufficient documentation

## 2012-01-08 ENCOUNTER — Other Ambulatory Visit (HOSPITAL_COMMUNITY): Payer: Self-pay | Admitting: *Deleted

## 2012-01-09 ENCOUNTER — Encounter (HOSPITAL_COMMUNITY)
Admission: RE | Admit: 2012-01-09 | Discharge: 2012-01-09 | Disposition: A | Discharge status: Home / Self Care | Payer: PRIVATE HEALTH INSURANCE | Source: Ambulatory Visit | Attending: Nephrology | Admitting: Nephrology

## 2012-01-09 DIAGNOSIS — D638 Anemia in other chronic diseases classified elsewhere: Secondary | ICD-10-CM | POA: Insufficient documentation

## 2012-01-09 DIAGNOSIS — N183 Chronic kidney disease, stage 3 unspecified: Secondary | ICD-10-CM | POA: Insufficient documentation

## 2012-01-09 MED ORDER — EPOETIN ALFA 20000 UNIT/ML IJ SOLN
INTRAMUSCULAR | Status: AC
  Administered 2012-01-09: 20000 [IU] via SUBCUTANEOUS
  Filled 2012-01-09: qty 1

## 2012-01-09 MED ORDER — EPOETIN ALFA 20000 UNIT/ML IJ SOLN
20000.0000 [IU] | INTRAMUSCULAR | Status: DC
  Administered 2012-01-09: 20000 [IU] via SUBCUTANEOUS

## 2012-01-23 ENCOUNTER — Encounter (HOSPITAL_COMMUNITY)
Admission: RE | Admit: 2012-01-23 | Discharge: 2012-01-23 | Disposition: A | Discharge status: Home / Self Care | Payer: PRIVATE HEALTH INSURANCE | Source: Ambulatory Visit | Attending: Nephrology | Admitting: Nephrology

## 2012-01-23 MED ORDER — EPOETIN ALFA 20000 UNIT/ML IJ SOLN
20000.0000 [IU] | INTRAMUSCULAR | Status: DC
  Administered 2012-01-23: 20000 [IU] via SUBCUTANEOUS

## 2012-01-23 MED ORDER — EPOETIN ALFA 20000 UNIT/ML IJ SOLN
INTRAMUSCULAR | Status: AC
  Administered 2012-01-23: 20000 [IU] via SUBCUTANEOUS
  Filled 2012-01-23: qty 1

## 2012-01-28 LAB — POCT HEMOGLOBIN-HEMACUE: Hemoglobin: 10.8 g/dL — ABNORMAL LOW (ref 12.0–15.0)

## 2012-02-05 ENCOUNTER — Other Ambulatory Visit (HOSPITAL_COMMUNITY): Payer: Self-pay | Admitting: *Deleted

## 2012-02-06 ENCOUNTER — Encounter (HOSPITAL_COMMUNITY)
Admission: RE | Admit: 2012-02-06 | Discharge: 2012-02-06 | Disposition: A | Discharge status: Home / Self Care | Payer: PRIVATE HEALTH INSURANCE | Source: Ambulatory Visit | Attending: Nephrology | Admitting: Nephrology

## 2012-02-06 LAB — RENAL FUNCTION PANEL
Calcium: 8.8 mg/dL (ref 8.4–10.5)
GFR calc Af Amer: 19 mL/min — ABNORMAL LOW (ref >90)
GFR calc non Af Amer: 16 mL/min — ABNORMAL LOW (ref >90)
Glucose, Bld: 210 mg/dL — ABNORMAL HIGH (ref 70–99)
Phosphorus: 4 mg/dL (ref 2.3–4.6)
Sodium: 139 mEq/L (ref 135–145)

## 2012-02-06 LAB — POCT HEMOGLOBIN-HEMACUE: Hemoglobin: 10.4 g/dL — ABNORMAL LOW (ref 12.0–15.0)

## 2012-02-06 LAB — IRON AND TIBC: UIBC: 156 ug/dL (ref 125–400)

## 2012-02-06 MED ORDER — EPOETIN ALFA 20000 UNIT/ML IJ SOLN
20000.0000 [IU] | INTRAMUSCULAR | Status: DC
  Administered 2012-02-06: 20000 [IU] via SUBCUTANEOUS

## 2012-02-06 MED ORDER — EPOETIN ALFA 20000 UNIT/ML IJ SOLN
INTRAMUSCULAR | Status: AC
  Filled 2012-02-06: qty 1

## 2012-02-20 ENCOUNTER — Encounter (HOSPITAL_COMMUNITY)
Admission: RE | Admit: 2012-02-20 | Discharge: 2012-02-20 | Disposition: A | Discharge status: Home / Self Care | Payer: PRIVATE HEALTH INSURANCE | Source: Ambulatory Visit | Attending: Nephrology | Admitting: Nephrology

## 2012-02-20 DIAGNOSIS — D638 Anemia in other chronic diseases classified elsewhere: Secondary | ICD-10-CM | POA: Insufficient documentation

## 2012-02-20 DIAGNOSIS — N183 Chronic kidney disease, stage 3 unspecified: Secondary | ICD-10-CM | POA: Insufficient documentation

## 2012-02-20 LAB — POCT HEMOGLOBIN-HEMACUE: Hemoglobin: 11.2 g/dL — ABNORMAL LOW (ref 12.0–15.0)

## 2012-02-20 MED ORDER — EPOETIN ALFA 20000 UNIT/ML IJ SOLN: 20000.0000 [IU] | INTRAMUSCULAR | Status: DC

## 2012-03-05 ENCOUNTER — Encounter (HOSPITAL_COMMUNITY)
Admission: RE | Admit: 2012-03-05 | Discharge: 2012-03-05 | Disposition: A | Discharge status: Home / Self Care | Payer: PRIVATE HEALTH INSURANCE | Source: Ambulatory Visit | Attending: Nephrology | Admitting: Nephrology

## 2012-03-05 LAB — RENAL FUNCTION PANEL
BUN: 57 mg/dL — ABNORMAL HIGH (ref 6–23)
Calcium: 9.2 mg/dL (ref 8.4–10.5)
Glucose, Bld: 158 mg/dL — ABNORMAL HIGH (ref 70–99)
Phosphorus: 3.2 mg/dL (ref 2.3–4.6)
Sodium: 137 mEq/L (ref 135–145)

## 2012-03-05 MED ORDER — EPOETIN ALFA 20000 UNIT/ML IJ SOLN
20000.0000 [IU] | INTRAMUSCULAR | Status: DC
  Administered 2012-03-05: 20000 [IU] via SUBCUTANEOUS
  Filled 2012-03-05: qty 1

## 2012-03-06 LAB — IRON AND TIBC: UIBC: 148 ug/dL (ref 125–400)

## 2012-03-06 LAB — FERRITIN: Ferritin: 549 ng/mL — ABNORMAL HIGH (ref 10–291)

## 2012-03-19 ENCOUNTER — Encounter (HOSPITAL_COMMUNITY)
Admission: RE | Admit: 2012-03-19 | Discharge: 2012-03-19 | Disposition: A | Discharge status: Home / Self Care | Payer: PRIVATE HEALTH INSURANCE | Source: Ambulatory Visit | Attending: Nephrology | Admitting: Nephrology

## 2012-03-19 DIAGNOSIS — N183 Chronic kidney disease, stage 3 unspecified: Secondary | ICD-10-CM | POA: Insufficient documentation

## 2012-03-19 DIAGNOSIS — D638 Anemia in other chronic diseases classified elsewhere: Secondary | ICD-10-CM | POA: Insufficient documentation

## 2012-03-19 LAB — POCT HEMOGLOBIN-HEMACUE: Hemoglobin: 10.6 g/dL — ABNORMAL LOW (ref 12.0–15.0)

## 2012-03-19 MED ORDER — EPOETIN ALFA 20000 UNIT/ML IJ SOLN
20000.0000 [IU] | INTRAMUSCULAR | Status: DC
  Administered 2012-03-19: 20000 [IU] via SUBCUTANEOUS

## 2012-03-19 MED ORDER — EPOETIN ALFA 20000 UNIT/ML IJ SOLN
INTRAMUSCULAR | Status: AC
  Administered 2012-03-19: 20000 [IU] via SUBCUTANEOUS
  Filled 2012-03-19: qty 1

## 2012-04-02 ENCOUNTER — Encounter (HOSPITAL_COMMUNITY)
Admission: RE | Admit: 2012-04-02 | Discharge: 2012-04-02 | Disposition: A | Discharge status: Home / Self Care | Payer: PRIVATE HEALTH INSURANCE | Source: Ambulatory Visit | Attending: Nephrology | Admitting: Nephrology

## 2012-04-02 LAB — RENAL FUNCTION PANEL
Albumin: 3.5 g/dL (ref 3.5–5.2)
Calcium: 9.4 mg/dL (ref 8.4–10.5)
GFR calc Af Amer: 20 mL/min — ABNORMAL LOW (ref >90)
GFR calc non Af Amer: 17 mL/min — ABNORMAL LOW (ref >90)
Phosphorus: 3.3 mg/dL (ref 2.3–4.6)
Sodium: 141 mEq/L (ref 135–145)

## 2012-04-02 MED ORDER — EPOETIN ALFA 20000 UNIT/ML IJ SOLN
INTRAMUSCULAR | Status: AC
  Filled 2012-04-02: qty 1

## 2012-04-02 MED ORDER — EPOETIN ALFA 20000 UNIT/ML IJ SOLN
20000.0000 [IU] | INTRAMUSCULAR | Status: DC
  Administered 2012-04-02: 20000 [IU] via SUBCUTANEOUS

## 2012-04-15 ENCOUNTER — Other Ambulatory Visit (HOSPITAL_COMMUNITY): Payer: Self-pay | Admitting: *Deleted

## 2012-04-16 ENCOUNTER — Encounter (HOSPITAL_COMMUNITY)
Admission: RE | Admit: 2012-04-16 | Discharge: 2012-04-16 | Disposition: A | Discharge status: Home / Self Care | Payer: PRIVATE HEALTH INSURANCE | Source: Ambulatory Visit | Attending: Nephrology | Admitting: Nephrology

## 2012-04-16 DIAGNOSIS — D638 Anemia in other chronic diseases classified elsewhere: Secondary | ICD-10-CM | POA: Insufficient documentation

## 2012-04-16 DIAGNOSIS — N183 Chronic kidney disease, stage 3 unspecified: Secondary | ICD-10-CM | POA: Insufficient documentation

## 2012-04-16 MED ORDER — EPOETIN ALFA 20000 UNIT/ML IJ SOLN
20000.0000 [IU] | INTRAMUSCULAR | Status: DC
  Administered 2012-04-16: 20000 [IU] via SUBCUTANEOUS

## 2012-04-16 MED ORDER — EPOETIN ALFA 20000 UNIT/ML IJ SOLN
INTRAMUSCULAR | Status: AC
  Filled 2012-04-16: qty 1

## 2012-04-30 ENCOUNTER — Encounter (HOSPITAL_COMMUNITY)
Admission: RE | Admit: 2012-04-30 | Discharge: 2012-04-30 | Disposition: A | Discharge status: Home / Self Care | Payer: PRIVATE HEALTH INSURANCE | Source: Ambulatory Visit | Attending: Nephrology | Admitting: Nephrology

## 2012-04-30 LAB — RENAL FUNCTION PANEL
Albumin: 3.4 g/dL — ABNORMAL LOW (ref 3.5–5.2)
BUN: 60 mg/dL — ABNORMAL HIGH (ref 6–23)
Chloride: 106 mEq/L (ref 96–112)
GFR calc Af Amer: 18 mL/min — ABNORMAL LOW (ref >90)
Potassium: 4.5 mEq/L (ref 3.5–5.1)
Sodium: 140 mEq/L (ref 135–145)

## 2012-04-30 MED ORDER — EPOETIN ALFA 20000 UNIT/ML IJ SOLN
20000.0000 [IU] | INTRAMUSCULAR | Status: DC
  Administered 2012-04-30: 20000 [IU] via SUBCUTANEOUS
  Filled 2012-04-30: qty 1

## 2012-05-01 LAB — IRON AND TIBC
Iron: 65 ug/dL (ref 42–135)
TIBC: 225 ug/dL — ABNORMAL LOW (ref 250–470)

## 2012-05-01 LAB — FERRITIN: Ferritin: 762 ng/mL — ABNORMAL HIGH (ref 10–291)

## 2012-05-14 ENCOUNTER — Encounter (HOSPITAL_COMMUNITY)
Admission: RE | Admit: 2012-05-14 | Discharge: 2012-05-14 | Disposition: A | Discharge status: Home / Self Care | Payer: PRIVATE HEALTH INSURANCE | Source: Ambulatory Visit | Attending: Nephrology | Admitting: Nephrology

## 2012-05-14 DIAGNOSIS — N183 Chronic kidney disease, stage 3 unspecified: Secondary | ICD-10-CM | POA: Insufficient documentation

## 2012-05-14 DIAGNOSIS — D638 Anemia in other chronic diseases classified elsewhere: Secondary | ICD-10-CM | POA: Insufficient documentation

## 2012-05-14 MED ORDER — EPOETIN ALFA 20000 UNIT/ML IJ SOLN
20000.0000 [IU] | INTRAMUSCULAR | Status: DC
  Administered 2012-05-14: 20000 [IU] via SUBCUTANEOUS
  Filled 2012-05-14: qty 1

## 2012-05-19 ENCOUNTER — Ambulatory Visit (INDEPENDENT_AMBULATORY_CARE_PROVIDER_SITE_OTHER): Payer: PRIVATE HEALTH INSURANCE | Admitting: Cardiology

## 2012-05-19 ENCOUNTER — Encounter: Payer: Self-pay | Admitting: Cardiology

## 2012-05-19 VITALS — BP 164/70 | HR 80 | Ht 66.0 in | Wt 240.0 lb

## 2012-05-19 DIAGNOSIS — I10 Essential (primary) hypertension: Secondary | ICD-10-CM

## 2012-05-19 DIAGNOSIS — N814 Uterovaginal prolapse, unspecified: Secondary | ICD-10-CM

## 2012-05-19 DIAGNOSIS — N189 Chronic kidney disease, unspecified: Secondary | ICD-10-CM

## 2012-05-19 DIAGNOSIS — N039 Chronic nephritic syndrome with unspecified morphologic changes: Secondary | ICD-10-CM

## 2012-05-19 DIAGNOSIS — R0989 Other specified symptoms and signs involving the circulatory and respiratory systems: Secondary | ICD-10-CM

## 2012-05-19 LAB — LIPID PANEL
HDL: 32 mg/dL — ABNORMAL LOW (ref >39)
LDL Cholesterol: 126 mg/dL — ABNORMAL HIGH (ref 0–99)
Total CHOL/HDL Ratio: 5.6 Ratio
Triglycerides: 105 mg/dL (ref <150)
VLDL: 21 mg/dL (ref 0–40)

## 2012-05-19 NOTE — Assessment & Plan Note (Signed)
Most recent carotid ultrasound study 3 years ago showed no significant obstruction; a repeat screening examination will be performed on the basis of bilateral carotid bruits.

## 2012-05-19 NOTE — Progress Notes (Deleted)
Name: [name]    DOB: 17-Mar-1932  Age: 78 y.o.  MR#: [id number]       PCP:  [name], MD      Insurance: @PAYORNAME @   CC:    Chief Complaint  Patient presents with  . Hypertension    No complaijnts - Med bottles reviewed/TC    VS BP 164/70  Pulse 80  Ht 5\' 6"  (1.676 m)  Wt 240 lb (108.863 kg)  BMI 38.74 kg/m2  Weights Current Weight  05/19/12 240 lb (108.863 kg)  07/19/11 244 lb (110.678 kg)  10/26/10 229 lb (103.874 kg)    Blood Pressure  BP Readings from Last 3 Encounters:  05/19/12 164/70  05/14/12 169/76  04/30/12 147/73     Admit date:  (Not on file) Last encounter with RMR:  Visit date not found   Allergy Allergies  Allergen Reactions  . Iodinated Diagnostic Agents     Current Outpatient Prescriptions  Medication Sig Dispense Refill  . ALPRAZolam (XANAX) 0.5 MG tablet Take 0.5 mg by mouth every 6 (six) hours.        Marland Kitchen amLODipine (NORVASC) 10 MG tablet Take 10 mg by mouth daily.        . calcitRIOL (ROCALTROL) 0.25 MCG capsule Take 0.25 mcg by mouth every other day.        . fish oil-omega-3 fatty acids 1000 MG capsule Take 1 capsule by mouth daily.        Marland Kitchen glipiZIDE (GLUCOTROL XL) 2.5 MG 24 hr tablet Take 2.5 mg by mouth daily.        Marland Kitchen levothyroxine (SYNTHROID, LEVOTHROID) 100 MCG tablet Take 100 mcg by mouth daily.        Marland Kitchen torsemide (DEMADEX) 20 MG tablet Take 20 mg by mouth daily.          Discontinued Meds:   There are no discontinued medications.  Patient Active Problem List  Diagnosis  . DIABETES MELLITUS  . OVERWEIGHT/OBESITY  . CAROTID BRUIT, LEFT  . Chronic kidney disease  . Hypertension  . Anemia associated with chronic renal failure  . Hypothyroid    [facility] Outpatient Visit on 05/14/2012  Component Date Value  . Hemoglobin 05/14/2012 10.4[facility] Outpatient Visit on 04/30/2012  Component Date Value  . Sodium 04/30/2012 140   . Potassium 04/30/2012 4.5   . Chloride 04/30/2012 106   . CO2 04/30/2012 22    . Glucose, Bld 04/30/2012 122*  . BUN 04/30/2012 60*  . Creatinine, Ser 04/30/2012 2.74*  . Calcium 04/30/2012 9.2   . Phosphorus 04/30/2012 3.1   . Albumin 04/30/2012 3.4*  . GFR calc non Af Amer 04/30/2012 16*  . GFR calc Af Amer 04/30/2012 18*  . Iron 04/30/2012 65   . TIBC 04/30/2012 225*  . Saturation Ratios 04/30/2012 29   . UIBC 04/30/2012 160   . Ferritin 04/30/2012 762*  . Hemoglobin 04/30/2012 10.3[facility] Outpatient Visit on 04/16/2012  Component Date Value  . Hemoglobin 04/16/2012 10.3[facility] Outpatient Visit on 04/02/2012  Component Date Value  . Sodium 04/02/2012 141   . Potassium 04/02/2012 5.0   . Chloride 04/02/2012 105   . CO2 04/02/2012 22   . Glucose, Bld 04/02/2012 104*  . BUN 04/02/2012 49*  . Creatinine, Ser 04/02/2012 2.55*  . Calcium 04/02/2012 9.4   . Phosphorus 04/02/2012 3.3   . Albumin 04/02/2012 3.5   . GFR calc non Af Amer 04/02/2012 17*  . GFR calc Af Denyse Dago  04/02/2012 20*  . Iron 04/02/2012 57   . TIBC 04/02/2012 202*  . Saturation Ratios 04/02/2012 28   . UIBC 04/02/2012 145   . Ferritin 04/02/2012 729*  . Hemoglobin 04/02/2012 10.3[facility] Outpatient Visit on 03/19/2012  Component Date Value  . Hemoglobin 03/19/2012 10.6[facility] Outpatient Visit on 03/05/2012  Component Date Value  . Sodium 03/05/2012 137   . Potassium 03/05/2012 4.3   . Chloride 03/05/2012 102   . CO2 03/05/2012 21   . Glucose, Bld 03/05/2012 158*  . BUN 03/05/2012 57*  . Creatinine, Ser 03/05/2012 2.51*  . Calcium 03/05/2012 9.2   . Phosphorus 03/05/2012 3.2   . Albumin 03/05/2012 3.4*  . GFR calc non Af Amer 03/05/2012 17*  . GFR calc Af Amer 03/05/2012 20*  . Iron 03/05/2012 56   . TIBC 03/05/2012 204*  . Saturation Ratios 03/05/2012 27   . UIBC 03/05/2012 148   . Ferritin 03/05/2012 549*  . Hemoglobin 03/05/2012 10.4[facility] Outpatient Visit on 02/20/2012  Component Date Value  . Hemoglobin 02/20/2012 11.2*     Results for this  Opt Visit:     Results for orders placed during the hospital encounter of 05/14/12  POCT HEMOGLOBIN-HEMACUE      Component Value Range   Hemoglobin 10.4 (*) 12.0 - 15.0 g/dL    EKG No orders found for this or any previous visit.   Prior Assessment and Plan Problem List as of 05/19/2012            Cardiology Problems   Hypertension   Last Assessment & Plan Note   07/19/2011 Office Visit Addendum 07/26/2011  7:26 PM by Kathlen Brunswick, MD    Blood pressure control is marginal.  An additional antihypertensive agent, either a beta blocker or ACE inhibitor can be considered.  The latter may helped preserve long-term renal function.  I will defer to Dr. Hyman Hopes to address this issue at the patient's upcoming office visit.      Other   DIABETES MELLITUS   Last Assessment & Plan Note   07/19/2011 Office Visit Signed 07/19/2011 12:52 PM by Kathlen Brunswick, MD    Hemoglobin A1c of 6.5 in 02/2011.  Adequate control with current medical regime.    OVERWEIGHT/OBESITY   CAROTID BRUIT, LEFT   Last Assessment & Plan Note   07/19/2011 Office Visit Signed 07/19/2011 12:51 PM by Kathlen Brunswick, MD    Not appreciated at this visit.  Minimal disease was identified on carotid ultrasound in 2010.  In the absence of symptoms or more impressive physical findings, additional testing will probably not be necessary.    Chronic kidney disease   Last Assessment & Plan Note   07/19/2011 Office Visit Signed 07/19/2011 12:51 PM by Kathlen Brunswick, MD    Renal disease is progressive.  Patient's daughter requires dialysis, so she is familiar with the process.  Dr. Hyman Hopes is doing an excellent job at maintaining the modest renal dysfunction that remains.    Anemia associated with chronic renal failure   Last Assessment & Plan Note   07/19/2011 Office Visit Addendum 07/26/2011  7:25 PM by Kathlen Brunswick, MD    Hemoglobin is moderately depressed as of the most recent measurement.  An increase in ESA therapy may be  warranted. Patient has an upcoming visit with Dr. Hyman Hopes who is managing this issue.    Hypothyroid   Last Assessment & Plan Note   07/19/2011 Office Visit Signed 07/19/2011  12:55 PM by Kathlen Brunswick, MD    Patient was euthyroid when last tested 5 months ago.        Imaging: No results found.   FRS Calculation: Score not calculated. Missing: Total Cholesterol

## 2012-05-19 NOTE — Assessment & Plan Note (Signed)
Renal disease has been slowly progressive, but creatinine is actually lower than it was one year ago.  She is doing well under the care of Dr. Hyman Hopes.

## 2012-05-19 NOTE — Assessment & Plan Note (Addendum)
Most recent hemoglobin was 10.3 in 04/2012, which represents improvement with ESA therapy.

## 2012-05-19 NOTE — Patient Instructions (Addendum)
Your physician recommends that you schedule a follow-up appointment in:  1-  1-2 weeks for blood pressure check 2 - 10 months with provider in office  Your physician recommends that you return for lab work in: today  Your physician has requested that you have a carotid duplex. This test is an ultrasound of the carotid arteries in your neck. It looks at blood flow through these arteries that supply the brain with blood. Allow one hour for this exam. There are no restrictions or special instructions.

## 2012-05-19 NOTE — Assessment & Plan Note (Signed)
Blood pressure is elevated at this visit, the patient reports excellent values at home.  She attributes the high reading in the office today to substantial stress related to illnesses in family members.  She will return to the office in 1-2 weeks with her list of home measurements for a repeat blood pressure assessment.  For now, no changes will be made in her medical regimen.

## 2012-05-19 NOTE — Progress Notes (Signed)
Patient ID: [name], female   DOB: [birth year], 78 y.o.   MRN: [id number]  HPI: Scheduled return visit for this lovely older woman with hypertension, diabetes and cerebrovascular disease, but no known coronary disease.  Moderate chronic kidney disease it is managed by [facility].  Since I last saw her 2 months ago, she has done quite well with good control of diabetes and hypertension.  She has not been hospitalized or required urgent medical treatment and has had a stable performance status.  She did require reduction of a prolapsed uterus by Dr. Despina Hidden, but cannot tell me exactly when this occurred nor whether she is currently using a pessary to prevent recurrence.  Surgical correction was discussed, but not recommended for her due to her advanced age.  Prior to Admission medications   Medication Sig Start Date End Date Taking? Authorizing Provider  ALPRAZolam Prudy Feeler) 0.5 MG tablet Take 0.5 mg by mouth every 6 (six) hours.     Yes Historical Provider, MD  amLODipine (NORVASC) 10 MG tablet Take 10 mg by mouth daily.     Yes Historical Provider, MD  calcitRIOL (ROCALTROL) 0.25 MCG capsule Take 0.25 mcg by mouth every other day.     Yes Historical Provider, MD  fish oil-omega-3 fatty acids 1000 MG capsule Take 1 capsule by mouth daily.     Yes Historical Provider, MD  glipiZIDE (GLUCOTROL XL) 2.5 MG 24 hr tablet Take 2.5 mg by mouth daily.     Yes Historical Provider, MD  levothyroxine (SYNTHROID, LEVOTHROID) 100 MCG tablet Take 100 mcg by mouth daily.     Yes Historical Provider, MD  torsemide (DEMADEX) 20 MG tablet Take 20 mg by mouth daily.     Yes Historical Provider, MD    Allergies  Allergen Reactions  . Iodinated Diagnostic Agents      Past medical history, social history, and family history reviewed and updated.  ROS: Denies chest pain, dyspnea, orthopnea or PND.  She has chronic pedal edema and has had persistent sores on the dorsum of her toes.  She describes substantial recent  stress due to family illnesses including what sounds like widespread lymphoma in her grandson.  PHYSICAL EXAM: BP 164/70  Pulse 80  Ht 5\' 6"  (1.676 m)  Wt 108.863 kg (240 lb)  BMI 38.74 kg/m2 ; weight decreased 4 pounds since 10 months ago General-Well developed; no acute distress Body habitus-Moderately overweight Neck-No JVD; Bilateral carotid bruits Lungs-clear lung fields; resonant to percussion Cardiovascular-normal PMI; normal S1 and accentuated P2 Abdomen-normal bowel sounds; soft and non-tender without masses or organomegaly Musculoskeletal-No deformities, no cyanosis or clubbing Neurologic-Normal cranial nerves; symmetric strength and tone Skin-Warm, Shallow ulcerations over the dorsum of the right second toe and the left third toe Extremities-distal pulses intact; 1+ edema  ASSESSMENT AND PLAN:  Quinnesec Bing, MD 05/19/2012 11:38 AM

## 2012-05-20 ENCOUNTER — Other Ambulatory Visit: Payer: Self-pay | Admitting: *Deleted

## 2012-05-20 ENCOUNTER — Encounter: Payer: Self-pay | Admitting: Cardiology

## 2012-05-20 DIAGNOSIS — E785 Hyperlipidemia, unspecified: Secondary | ICD-10-CM | POA: Insufficient documentation

## 2012-05-20 MED ORDER — PRAVASTATIN SODIUM 40 MG PO TABS: 40.0000 mg | ORAL_TABLET | Freq: Every evening | ORAL | Status: DC

## 2012-05-28 ENCOUNTER — Encounter (HOSPITAL_COMMUNITY)
Admission: RE | Admit: 2012-05-28 | Discharge: 2012-05-28 | Disposition: A | Discharge status: Home / Self Care | Payer: PRIVATE HEALTH INSURANCE | Source: Ambulatory Visit | Attending: Nephrology | Admitting: Nephrology

## 2012-05-28 LAB — RENAL FUNCTION PANEL
GFR calc Af Amer: 21 mL/min — ABNORMAL LOW (ref >90)
Glucose, Bld: 156 mg/dL — ABNORMAL HIGH (ref 70–99)
Phosphorus: 3.3 mg/dL (ref 2.3–4.6)
Potassium: 4.8 mEq/L (ref 3.5–5.1)
Sodium: 138 mEq/L (ref 135–145)

## 2012-05-28 LAB — IRON AND TIBC
Iron: 56 ug/dL (ref 42–135)
UIBC: 175 ug/dL (ref 125–400)

## 2012-05-28 MED ORDER — EPOETIN ALFA 20000 UNIT/ML IJ SOLN
20000.0000 [IU] | INTRAMUSCULAR | Status: DC
  Administered 2012-05-28: 20000 [IU] via SUBCUTANEOUS
  Filled 2012-05-28: qty 1

## 2012-06-03 ENCOUNTER — Ambulatory Visit (HOSPITAL_COMMUNITY)
Admission: RE | Admit: 2012-06-03 | Discharge: 2012-06-03 | Disposition: A | Discharge status: Home / Self Care | Payer: PRIVATE HEALTH INSURANCE | Source: Ambulatory Visit | Attending: Cardiology | Admitting: Cardiology

## 2012-06-03 ENCOUNTER — Ambulatory Visit (INDEPENDENT_AMBULATORY_CARE_PROVIDER_SITE_OTHER): Payer: PRIVATE HEALTH INSURANCE | Admitting: *Deleted

## 2012-06-03 VITALS — BP 158/70 | HR 84 | Ht 66.0 in | Wt 243.0 lb

## 2012-06-03 DIAGNOSIS — I10 Essential (primary) hypertension: Secondary | ICD-10-CM | POA: Insufficient documentation

## 2012-06-03 DIAGNOSIS — R0989 Other specified symptoms and signs involving the circulatory and respiratory systems: Secondary | ICD-10-CM | POA: Insufficient documentation

## 2012-06-03 DIAGNOSIS — E119 Type 2 diabetes mellitus without complications: Secondary | ICD-10-CM | POA: Insufficient documentation

## 2012-06-03 NOTE — Progress Notes (Signed)
Presents today for a blood pressure check.  Brings a list of adequate blood pressures, despite elevated pressure this am.  States taking medications, per list, as prescribed.  No complaints noted at this time.

## 2012-06-11 ENCOUNTER — Encounter: Payer: Self-pay | Admitting: *Deleted

## 2012-06-11 ENCOUNTER — Encounter (HOSPITAL_COMMUNITY)
Admission: RE | Admit: 2012-06-11 | Discharge: 2012-06-11 | Disposition: A | Discharge status: Home / Self Care | Payer: PRIVATE HEALTH INSURANCE | Source: Ambulatory Visit | Attending: Nephrology | Admitting: Nephrology

## 2012-06-11 ENCOUNTER — Encounter: Payer: Self-pay | Admitting: Cardiology

## 2012-06-11 DIAGNOSIS — N183 Chronic kidney disease, stage 3 unspecified: Secondary | ICD-10-CM | POA: Insufficient documentation

## 2012-06-11 DIAGNOSIS — D638 Anemia in other chronic diseases classified elsewhere: Secondary | ICD-10-CM | POA: Insufficient documentation

## 2012-06-11 MED ORDER — EPOETIN ALFA 20000 UNIT/ML IJ SOLN
20000.0000 [IU] | INTRAMUSCULAR | Status: DC
  Administered 2012-06-11: 20000 [IU] via SUBCUTANEOUS
  Filled 2012-06-11: qty 1

## 2012-06-11 MED ORDER — LISINOPRIL 10 MG PO TABS: 10.0000 mg | ORAL_TABLET | Freq: Every day | ORAL | Status: DC

## 2012-06-11 NOTE — Progress Notes (Signed)
Patient ID: [name], female   DOB: 1932/02/03, 77 y.o.   MRN: [id number]  Tablet pressure log reviewed.  15 determinations collected in 05/2012.  All diastolics were less than 75 mmHg.  25% of systolics significantly exceeded 140 mmHg.  Office blood pressure today was consistent.  Creatinine is 2.45 with mild proteinuria.  No formal measurement of microalbumin has been obtained.  No allergies nor adverse reactions to antihypertensive medication are reported.  Lisinopril will be started at a dose of 10 mg per day with close monitoring of renal function, electrolytes and blood pressure.    Patient is to continue home blood pressures and return these in 2 and 4 weeks when [facility] will be obtained.

## 2012-06-11 NOTE — Progress Notes (Signed)
Recommendations given to patient and she verbalizes understanding.

## 2012-06-20 ENCOUNTER — Other Ambulatory Visit: Payer: Self-pay | Admitting: *Deleted

## 2012-06-20 DIAGNOSIS — I10 Essential (primary) hypertension: Secondary | ICD-10-CM

## 2012-06-24 ENCOUNTER — Encounter: Payer: Self-pay | Admitting: Cardiology

## 2012-06-24 DIAGNOSIS — E875 Hyperkalemia: Secondary | ICD-10-CM | POA: Insufficient documentation

## 2012-06-24 LAB — BASIC METABOLIC PANEL
CO2: 21 mEq/L (ref 19–32)
Calcium: 8.7 mg/dL (ref 8.4–10.5)
Glucose, Bld: 120 mg/dL — ABNORMAL HIGH (ref 70–99)
Potassium: 5.7 mEq/L — ABNORMAL HIGH (ref 3.5–5.3)
Sodium: 142 mEq/L (ref 135–145)

## 2012-06-25 ENCOUNTER — Other Ambulatory Visit: Payer: Self-pay | Admitting: *Deleted

## 2012-06-25 ENCOUNTER — Encounter: Payer: Self-pay | Admitting: *Deleted

## 2012-06-25 ENCOUNTER — Encounter (HOSPITAL_COMMUNITY)
Admission: RE | Admit: 2012-06-25 | Discharge: 2012-06-25 | Disposition: A | Discharge status: Home / Self Care | Payer: PRIVATE HEALTH INSURANCE | Source: Ambulatory Visit | Attending: Nephrology | Admitting: Nephrology

## 2012-06-25 DIAGNOSIS — N289 Disorder of kidney and ureter, unspecified: Secondary | ICD-10-CM

## 2012-06-25 LAB — RENAL FUNCTION PANEL
BUN: 52 mg/dL — ABNORMAL HIGH (ref 6–23)
CO2: 20 mEq/L (ref 19–32)
Calcium: 9 mg/dL (ref 8.4–10.5)
Creatinine, Ser: 3.06 mg/dL — ABNORMAL HIGH (ref 0.50–1.10)
Glucose, Bld: 135 mg/dL — ABNORMAL HIGH (ref 70–99)

## 2012-06-25 LAB — IRON AND TIBC
Saturation Ratios: 32 % (ref 20–55)
UIBC: 149 ug/dL (ref 125–400)

## 2012-06-25 MED ORDER — LISINOPRIL 5 MG PO TABS: 5.0000 mg | ORAL_TABLET | Freq: Every day | ORAL | Status: DC

## 2012-06-25 MED ORDER — EPOETIN ALFA 20000 UNIT/ML IJ SOLN
INTRAMUSCULAR | Status: AC
  Administered 2012-06-25: 20000 [IU] via SUBCUTANEOUS
  Filled 2012-06-25: qty 1

## 2012-06-25 MED ORDER — EPOETIN ALFA 20000 UNIT/ML IJ SOLN
20000.0000 [IU] | INTRAMUSCULAR | Status: DC
  Administered 2012-06-25: 20000 [IU] via SUBCUTANEOUS

## 2012-06-26 LAB — POCT HEMOGLOBIN-HEMACUE: Hemoglobin: 10.5 g/dL — ABNORMAL LOW (ref 12.0–15.0)

## 2012-06-27 ENCOUNTER — Telehealth: Payer: Self-pay | Admitting: *Deleted

## 2012-06-27 NOTE — Telephone Encounter (Signed)
Discussed with patient that labs, are in fact due at both times.

## 2012-06-27 NOTE — Telephone Encounter (Signed)
Patient received letter about lab work being due 07/25/12.  States that she thought she was to have some drawn on 07/08/12. / tg

## 2012-07-09 ENCOUNTER — Encounter (HOSPITAL_COMMUNITY)
Admission: RE | Admit: 2012-07-09 | Discharge: 2012-07-09 | Disposition: A | Discharge status: Home / Self Care | Payer: PRIVATE HEALTH INSURANCE | Source: Ambulatory Visit | Attending: Nephrology | Admitting: Nephrology

## 2012-07-09 LAB — POCT HEMOGLOBIN-HEMACUE: Hemoglobin: 10.7 g/dL — ABNORMAL LOW (ref 12.0–15.0)

## 2012-07-09 MED ORDER — EPOETIN ALFA 20000 UNIT/ML IJ SOLN
20000.0000 [IU] | INTRAMUSCULAR | Status: DC
  Administered 2012-07-09: 20000 [IU] via SUBCUTANEOUS

## 2012-07-09 MED ORDER — EPOETIN ALFA 20000 UNIT/ML IJ SOLN
INTRAMUSCULAR | Status: AC
  Filled 2012-07-09: qty 1

## 2012-07-22 ENCOUNTER — Other Ambulatory Visit (HOSPITAL_COMMUNITY): Payer: Self-pay | Admitting: *Deleted

## 2012-07-23 ENCOUNTER — Encounter (HOSPITAL_COMMUNITY)
Admission: RE | Admit: 2012-07-23 | Discharge: 2012-07-23 | Disposition: A | Discharge status: Home / Self Care | Payer: PRIVATE HEALTH INSURANCE | Source: Ambulatory Visit | Attending: Nephrology | Admitting: Nephrology

## 2012-07-23 DIAGNOSIS — D638 Anemia in other chronic diseases classified elsewhere: Secondary | ICD-10-CM | POA: Insufficient documentation

## 2012-07-23 DIAGNOSIS — N183 Chronic kidney disease, stage 3 unspecified: Secondary | ICD-10-CM | POA: Insufficient documentation

## 2012-07-23 LAB — RENAL FUNCTION PANEL
Albumin: 3.8 g/dL (ref 3.5–5.2)
BUN: 77 mg/dL — ABNORMAL HIGH (ref 6–23)
Calcium: 9.3 mg/dL (ref 8.4–10.5)
Creatinine, Ser: 2.95 mg/dL — ABNORMAL HIGH (ref 0.50–1.10)
GFR calc non Af Amer: 14 mL/min — ABNORMAL LOW (ref >90)
Phosphorus: 3.5 mg/dL (ref 2.3–4.6)

## 2012-07-23 LAB — FERRITIN: Ferritin: 625 ng/mL — ABNORMAL HIGH (ref 10–291)

## 2012-07-23 LAB — POCT HEMOGLOBIN-HEMACUE: Hemoglobin: 11.1 g/dL — ABNORMAL LOW (ref 12.0–15.0)

## 2012-07-23 LAB — IRON AND TIBC: TIBC: 220 ug/dL — ABNORMAL LOW (ref 250–470)

## 2012-07-23 MED ORDER — EPOETIN ALFA 20000 UNIT/ML IJ SOLN: 20000.0000 [IU] | INTRAMUSCULAR | Status: DC

## 2012-07-25 ENCOUNTER — Other Ambulatory Visit: Payer: Self-pay | Admitting: *Deleted

## 2012-07-25 DIAGNOSIS — N289 Disorder of kidney and ureter, unspecified: Secondary | ICD-10-CM

## 2012-08-06 ENCOUNTER — Encounter (HOSPITAL_COMMUNITY)
Admission: RE | Admit: 2012-08-06 | Discharge: 2012-08-06 | Disposition: A | Discharge status: Home / Self Care | Payer: PRIVATE HEALTH INSURANCE | Source: Ambulatory Visit | Attending: Nephrology | Admitting: Nephrology

## 2012-08-06 LAB — POCT HEMOGLOBIN-HEMACUE: Hemoglobin: 10.2 g/dL — ABNORMAL LOW (ref 12.0–15.0)

## 2012-08-06 MED ORDER — EPOETIN ALFA 20000 UNIT/ML IJ SOLN
INTRAMUSCULAR | Status: AC
  Administered 2012-08-06: 20000 [IU] via SUBCUTANEOUS
  Filled 2012-08-06: qty 1

## 2012-08-06 MED ORDER — EPOETIN ALFA 20000 UNIT/ML IJ SOLN
20000.0000 [IU] | INTRAMUSCULAR | Status: DC
  Administered 2012-08-06: 20000 [IU] via SUBCUTANEOUS

## 2012-08-13 ENCOUNTER — Other Ambulatory Visit: Payer: Self-pay | Admitting: *Deleted

## 2012-08-13 ENCOUNTER — Encounter: Payer: Self-pay | Admitting: *Deleted

## 2012-08-13 DIAGNOSIS — I10 Essential (primary) hypertension: Secondary | ICD-10-CM

## 2012-08-20 ENCOUNTER — Encounter (HOSPITAL_COMMUNITY)
Admission: RE | Admit: 2012-08-20 | Discharge: 2012-08-20 | Disposition: A | Discharge status: Home / Self Care | Payer: PRIVATE HEALTH INSURANCE | Source: Ambulatory Visit | Attending: Nephrology | Admitting: Nephrology

## 2012-08-20 ENCOUNTER — Encounter: Payer: Self-pay | Admitting: Cardiology

## 2012-08-20 DIAGNOSIS — N183 Chronic kidney disease, stage 3 unspecified: Secondary | ICD-10-CM | POA: Insufficient documentation

## 2012-08-20 DIAGNOSIS — D638 Anemia in other chronic diseases classified elsewhere: Secondary | ICD-10-CM | POA: Insufficient documentation

## 2012-08-20 LAB — RENAL FUNCTION PANEL
CO2: 19 mEq/L (ref 19–32)
Calcium: 9 mg/dL (ref 8.4–10.5)
Creatinine, Ser: 2.92 mg/dL — ABNORMAL HIGH (ref 0.50–1.10)
Glucose, Bld: 145 mg/dL — ABNORMAL HIGH (ref 70–99)
Phosphorus: 2.9 mg/dL (ref 2.3–4.6)

## 2012-08-20 LAB — BASIC METABOLIC PANEL
BUN: 75 mg/dL — ABNORMAL HIGH (ref 6–23)
Chloride: 109 mEq/L (ref 96–112)
Potassium: 5.5 mEq/L — ABNORMAL HIGH (ref 3.5–5.3)

## 2012-08-20 LAB — POCT HEMOGLOBIN-HEMACUE: Hemoglobin: 10.2 g/dL — ABNORMAL LOW (ref 12.0–15.0)

## 2012-08-20 MED ORDER — EPOETIN ALFA 20000 UNIT/ML IJ SOLN
INTRAMUSCULAR | Status: AC
  Filled 2012-08-20: qty 1

## 2012-08-20 MED ORDER — EPOETIN ALFA 20000 UNIT/ML IJ SOLN
20000.0000 [IU] | INTRAMUSCULAR | Status: DC
  Administered 2012-08-20: 20000 [IU] via SUBCUTANEOUS

## 2012-08-21 ENCOUNTER — Other Ambulatory Visit: Payer: Self-pay | Admitting: *Deleted

## 2012-08-21 ENCOUNTER — Encounter: Payer: Self-pay | Admitting: *Deleted

## 2012-08-21 DIAGNOSIS — I10 Essential (primary) hypertension: Secondary | ICD-10-CM

## 2012-08-21 LAB — FERRITIN: Ferritin: 557 ng/mL — ABNORMAL HIGH (ref 10–291)

## 2012-08-21 LAB — IRON AND TIBC
Saturation Ratios: 29 % (ref 20–55)
UIBC: 151 ug/dL (ref 125–400)

## 2012-09-04 ENCOUNTER — Encounter (HOSPITAL_COMMUNITY)
Admission: RE | Admit: 2012-09-04 | Discharge: 2012-09-04 | Disposition: A | Discharge status: Home / Self Care | Payer: PRIVATE HEALTH INSURANCE | Source: Ambulatory Visit | Attending: Nephrology | Admitting: Nephrology

## 2012-09-04 LAB — POCT HEMOGLOBIN-HEMACUE: Hemoglobin: 10.5 g/dL — ABNORMAL LOW (ref 12.0–15.0)

## 2012-09-04 MED ORDER — EPOETIN ALFA 20000 UNIT/ML IJ SOLN
INTRAMUSCULAR | Status: AC
  Administered 2012-09-04: 20000 [IU] via SUBCUTANEOUS
  Filled 2012-09-04: qty 1

## 2012-09-04 MED ORDER — EPOETIN ALFA 20000 UNIT/ML IJ SOLN
20000.0000 [IU] | INTRAMUSCULAR | Status: DC
  Administered 2012-09-04: 20000 [IU] via SUBCUTANEOUS

## 2012-09-11 ENCOUNTER — Telehealth: Payer: Self-pay | Admitting: Cardiology

## 2012-09-11 NOTE — Telephone Encounter (Signed)
Pt states dr Hyman Hopes took her off lisinopril and to cut pravastatin in half. Pt also wants some more BP logs mailed to her.

## 2012-09-11 NOTE — Telephone Encounter (Signed)
noted 

## 2012-09-18 ENCOUNTER — Encounter (HOSPITAL_COMMUNITY)
Admission: RE | Admit: 2012-09-18 | Discharge: 2012-09-18 | Disposition: A | Discharge status: Home / Self Care | Payer: PRIVATE HEALTH INSURANCE | Source: Ambulatory Visit | Attending: Nephrology | Admitting: Nephrology

## 2012-09-18 DIAGNOSIS — N183 Chronic kidney disease, stage 3 unspecified: Secondary | ICD-10-CM | POA: Insufficient documentation

## 2012-09-18 DIAGNOSIS — D638 Anemia in other chronic diseases classified elsewhere: Secondary | ICD-10-CM | POA: Insufficient documentation

## 2012-09-18 LAB — RENAL FUNCTION PANEL
Albumin: 3.6 g/dL (ref 3.5–5.2)
Calcium: 9.1 mg/dL (ref 8.4–10.5)
Creatinine, Ser: 2.62 mg/dL — ABNORMAL HIGH (ref 0.50–1.10)
GFR calc non Af Amer: 16 mL/min — ABNORMAL LOW (ref >90)
Phosphorus: 3.2 mg/dL (ref 2.3–4.6)

## 2012-09-18 LAB — POCT HEMOGLOBIN-HEMACUE: Hemoglobin: 10 g/dL — ABNORMAL LOW (ref 12.0–15.0)

## 2012-09-18 LAB — IRON AND TIBC: Saturation Ratios: 26 % (ref 20–55)

## 2012-09-18 MED ORDER — EPOETIN ALFA 20000 UNIT/ML IJ SOLN
20000.0000 [IU] | INTRAMUSCULAR | Status: DC
  Administered 2012-09-18: 20000 [IU] via SUBCUTANEOUS

## 2012-09-18 MED ORDER — EPOETIN ALFA 20000 UNIT/ML IJ SOLN
INTRAMUSCULAR | Status: AC
  Administered 2012-09-18: 20000 [IU] via SUBCUTANEOUS
  Filled 2012-09-18: qty 1

## 2012-09-19 ENCOUNTER — Other Ambulatory Visit: Payer: Self-pay | Admitting: Cardiology

## 2012-09-19 LAB — BASIC METABOLIC PANEL
CO2: 20 mEq/L (ref 19–32)
Calcium: 9 mg/dL (ref 8.4–10.5)
Chloride: 108 mEq/L (ref 96–112)
Potassium: 4.7 mEq/L (ref 3.5–5.3)
Sodium: 140 mEq/L (ref 135–145)

## 2012-09-21 ENCOUNTER — Encounter: Payer: Self-pay | Admitting: Cardiology

## 2012-09-22 ENCOUNTER — Encounter: Payer: Self-pay | Admitting: *Deleted

## 2012-10-01 ENCOUNTER — Encounter (HOSPITAL_COMMUNITY)
Admission: RE | Admit: 2012-10-01 | Discharge: 2012-10-01 | Disposition: A | Discharge status: Home / Self Care | Payer: PRIVATE HEALTH INSURANCE | Source: Ambulatory Visit | Attending: Nephrology | Admitting: Nephrology

## 2012-10-01 MED ORDER — EPOETIN ALFA 20000 UNIT/ML IJ SOLN
INTRAMUSCULAR | Status: AC
  Filled 2012-10-01: qty 1

## 2012-10-01 MED ORDER — EPOETIN ALFA 20000 UNIT/ML IJ SOLN
20000.0000 [IU] | INTRAMUSCULAR | Status: DC
  Administered 2012-10-01: 20000 [IU] via SUBCUTANEOUS

## 2012-10-14 ENCOUNTER — Other Ambulatory Visit (HOSPITAL_COMMUNITY): Payer: Self-pay | Admitting: *Deleted

## 2012-10-15 ENCOUNTER — Encounter (HOSPITAL_COMMUNITY)
Admission: RE | Admit: 2012-10-15 | Discharge: 2012-10-15 | Disposition: A | Discharge status: Home / Self Care | Payer: PRIVATE HEALTH INSURANCE | Source: Ambulatory Visit | Attending: Nephrology | Admitting: Nephrology

## 2012-10-15 DIAGNOSIS — N183 Chronic kidney disease, stage 3 unspecified: Secondary | ICD-10-CM | POA: Insufficient documentation

## 2012-10-15 DIAGNOSIS — D638 Anemia in other chronic diseases classified elsewhere: Secondary | ICD-10-CM | POA: Insufficient documentation

## 2012-10-15 LAB — RENAL FUNCTION PANEL
Albumin: 3.4 g/dL — ABNORMAL LOW (ref 3.5–5.2)
Chloride: 103 mEq/L (ref 96–112)
GFR calc Af Amer: 18 mL/min — ABNORMAL LOW (ref >90)
Glucose, Bld: 188 mg/dL — ABNORMAL HIGH (ref 70–99)
Phosphorus: 3.5 mg/dL (ref 2.3–4.6)
Potassium: 4.6 mEq/L (ref 3.5–5.1)
Sodium: 137 mEq/L (ref 135–145)

## 2012-10-15 LAB — IRON AND TIBC
Iron: 34 ug/dL — ABNORMAL LOW (ref 42–135)
TIBC: 232 ug/dL — ABNORMAL LOW (ref 250–470)

## 2012-10-15 MED ORDER — EPOETIN ALFA 20000 UNIT/ML IJ SOLN
20000.0000 [IU] | INTRAMUSCULAR | Status: DC
  Administered 2012-10-15: 20000 [IU] via SUBCUTANEOUS

## 2012-10-15 MED ORDER — EPOETIN ALFA 20000 UNIT/ML IJ SOLN
INTRAMUSCULAR | Status: AC
  Filled 2012-10-15: qty 1

## 2012-10-27 ENCOUNTER — Other Ambulatory Visit: Payer: Self-pay | Admitting: Cardiology

## 2012-10-28 LAB — BASIC METABOLIC PANEL
BUN: 53 mg/dL — ABNORMAL HIGH (ref 6–23)
Chloride: 109 mEq/L (ref 96–112)
Creat: 2.59 mg/dL — ABNORMAL HIGH (ref 0.50–1.10)
Glucose, Bld: 94 mg/dL (ref 70–99)
Potassium: 5.8 mEq/L — ABNORMAL HIGH (ref 3.5–5.3)

## 2012-10-29 ENCOUNTER — Encounter (HOSPITAL_COMMUNITY)
Admission: RE | Admit: 2012-10-29 | Discharge: 2012-10-29 | Disposition: A | Discharge status: Home / Self Care | Payer: PRIVATE HEALTH INSURANCE | Source: Ambulatory Visit | Attending: Nephrology | Admitting: Nephrology

## 2012-10-29 ENCOUNTER — Other Ambulatory Visit: Payer: Self-pay | Admitting: *Deleted

## 2012-10-29 MED ORDER — EPOETIN ALFA 20000 UNIT/ML IJ SOLN
INTRAMUSCULAR | Status: AC
  Filled 2012-10-29: qty 1

## 2012-10-29 MED ORDER — EPOETIN ALFA 20000 UNIT/ML IJ SOLN
20000.0000 [IU] | INTRAMUSCULAR | Status: DC
  Administered 2012-10-29: 20000 [IU] via SUBCUTANEOUS

## 2012-11-12 ENCOUNTER — Encounter (HOSPITAL_COMMUNITY)
Admission: RE | Admit: 2012-11-12 | Discharge: 2012-11-12 | Disposition: A | Discharge status: Home / Self Care | Payer: PRIVATE HEALTH INSURANCE | Source: Ambulatory Visit | Attending: Nephrology | Admitting: Nephrology

## 2012-11-12 DIAGNOSIS — D638 Anemia in other chronic diseases classified elsewhere: Secondary | ICD-10-CM | POA: Insufficient documentation

## 2012-11-12 DIAGNOSIS — N183 Chronic kidney disease, stage 3 unspecified: Secondary | ICD-10-CM | POA: Insufficient documentation

## 2012-11-12 LAB — IRON AND TIBC
Iron: 44 ug/dL (ref 42–135)
UIBC: 193 ug/dL (ref 125–400)

## 2012-11-12 LAB — RENAL FUNCTION PANEL
Albumin: 3.6 g/dL (ref 3.5–5.2)
BUN: 62 mg/dL — ABNORMAL HIGH (ref 6–23)
Calcium: 9.4 mg/dL (ref 8.4–10.5)
Creatinine, Ser: 2.57 mg/dL — ABNORMAL HIGH (ref 0.50–1.10)
Glucose, Bld: 78 mg/dL (ref 70–99)
Phosphorus: 3.1 mg/dL (ref 2.3–4.6)

## 2012-11-12 LAB — FERRITIN: Ferritin: 608 ng/mL — ABNORMAL HIGH (ref 10–291)

## 2012-11-12 LAB — POCT HEMOGLOBIN-HEMACUE: Hemoglobin: 10.1 g/dL — ABNORMAL LOW (ref 12.0–15.0)

## 2012-11-12 MED ORDER — EPOETIN ALFA 20000 UNIT/ML IJ SOLN
INTRAMUSCULAR | Status: AC
  Filled 2012-11-12: qty 1

## 2012-11-12 MED ORDER — EPOETIN ALFA 20000 UNIT/ML IJ SOLN
20000.0000 [IU] | INTRAMUSCULAR | Status: DC
  Administered 2012-11-12: 20000 [IU] via SUBCUTANEOUS

## 2012-11-21 ENCOUNTER — Other Ambulatory Visit: Payer: Self-pay | Admitting: Cardiology

## 2012-11-22 LAB — BASIC METABOLIC PANEL
Calcium: 8.6 mg/dL (ref 8.4–10.5)
Glucose, Bld: 173 mg/dL — ABNORMAL HIGH (ref 70–99)
Sodium: 136 mEq/L (ref 135–145)

## 2012-11-27 ENCOUNTER — Encounter (HOSPITAL_COMMUNITY)
Admission: RE | Admit: 2012-11-27 | Discharge: 2012-11-27 | Disposition: A | Discharge status: Home / Self Care | Payer: PRIVATE HEALTH INSURANCE | Source: Ambulatory Visit | Attending: Nephrology | Admitting: Nephrology

## 2012-11-27 LAB — POCT HEMOGLOBIN-HEMACUE: Hemoglobin: 9.6 g/dL — ABNORMAL LOW (ref 12.0–15.0)

## 2012-11-27 MED ORDER — EPOETIN ALFA 20000 UNIT/ML IJ SOLN
INTRAMUSCULAR | Status: AC
  Administered 2012-11-27: 20000 [IU] via SUBCUTANEOUS
  Filled 2012-11-27: qty 1

## 2012-11-27 MED ORDER — EPOETIN ALFA 20000 UNIT/ML IJ SOLN: 20000.0000 [IU] | INTRAMUSCULAR | Status: DC

## 2012-12-02 ENCOUNTER — Other Ambulatory Visit: Payer: Self-pay | Admitting: *Deleted

## 2012-12-02 ENCOUNTER — Encounter: Payer: Self-pay | Admitting: *Deleted

## 2012-12-02 DIAGNOSIS — I10 Essential (primary) hypertension: Secondary | ICD-10-CM

## 2012-12-05 ENCOUNTER — Encounter: Payer: Self-pay | Admitting: Cardiology

## 2012-12-08 ENCOUNTER — Other Ambulatory Visit: Payer: Self-pay | Admitting: *Deleted

## 2012-12-08 ENCOUNTER — Encounter: Payer: Self-pay | Admitting: *Deleted

## 2012-12-08 DIAGNOSIS — D649 Anemia, unspecified: Secondary | ICD-10-CM

## 2012-12-08 DIAGNOSIS — I10 Essential (primary) hypertension: Secondary | ICD-10-CM

## 2012-12-10 ENCOUNTER — Encounter (HOSPITAL_COMMUNITY)
Admission: RE | Admit: 2012-12-10 | Discharge: 2012-12-10 | Disposition: A | Discharge status: Home / Self Care | Payer: PRIVATE HEALTH INSURANCE | Source: Ambulatory Visit | Attending: Nephrology | Admitting: Nephrology

## 2012-12-10 DIAGNOSIS — D638 Anemia in other chronic diseases classified elsewhere: Secondary | ICD-10-CM | POA: Insufficient documentation

## 2012-12-10 DIAGNOSIS — N183 Chronic kidney disease, stage 3 unspecified: Secondary | ICD-10-CM | POA: Insufficient documentation

## 2012-12-10 LAB — RENAL FUNCTION PANEL
CO2: 20 mEq/L (ref 19–32)
Calcium: 8.6 mg/dL (ref 8.4–10.5)
GFR calc Af Amer: 18 mL/min — ABNORMAL LOW (ref >90)
GFR calc non Af Amer: 15 mL/min — ABNORMAL LOW (ref >90)
Phosphorus: 4 mg/dL (ref 2.3–4.6)
Potassium: 4.6 mEq/L (ref 3.5–5.1)
Sodium: 140 mEq/L (ref 135–145)

## 2012-12-10 LAB — IRON AND TIBC
Iron: 46 ug/dL (ref 42–135)
Saturation Ratios: 21 % (ref 20–55)

## 2012-12-10 MED ORDER — EPOETIN ALFA 20000 UNIT/ML IJ SOLN
INTRAMUSCULAR | Status: AC
  Filled 2012-12-10: qty 1

## 2012-12-10 MED ORDER — EPOETIN ALFA 20000 UNIT/ML IJ SOLN
20000.0000 [IU] | INTRAMUSCULAR | Status: DC
  Administered 2012-12-10: 20000 [IU] via SUBCUTANEOUS

## 2012-12-24 ENCOUNTER — Encounter (HOSPITAL_COMMUNITY): Payer: PRIVATE HEALTH INSURANCE

## 2012-12-31 ENCOUNTER — Encounter (HOSPITAL_COMMUNITY)
Admission: RE | Admit: 2012-12-31 | Discharge: 2012-12-31 | Disposition: A | Discharge status: Home / Self Care | Payer: PRIVATE HEALTH INSURANCE | Source: Ambulatory Visit | Attending: Nephrology | Admitting: Nephrology

## 2012-12-31 LAB — POCT HEMOGLOBIN-HEMACUE: Hemoglobin: 10.1 g/dL — ABNORMAL LOW (ref 12.0–15.0)

## 2012-12-31 MED ORDER — EPOETIN ALFA 20000 UNIT/ML IJ SOLN
INTRAMUSCULAR | Status: AC
  Filled 2012-12-31: qty 1

## 2012-12-31 MED ORDER — EPOETIN ALFA 20000 UNIT/ML IJ SOLN
20000.0000 [IU] | INTRAMUSCULAR | Status: DC
  Administered 2012-12-31: 20000 [IU] via SUBCUTANEOUS

## 2013-01-13 ENCOUNTER — Other Ambulatory Visit (HOSPITAL_COMMUNITY): Payer: Self-pay | Admitting: *Deleted

## 2013-01-14 ENCOUNTER — Encounter (HOSPITAL_COMMUNITY)
Admission: RE | Admit: 2013-01-14 | Discharge: 2013-01-14 | Disposition: A | Discharge status: Home / Self Care | Payer: PRIVATE HEALTH INSURANCE | Source: Ambulatory Visit | Attending: Nephrology | Admitting: Nephrology

## 2013-01-14 DIAGNOSIS — N183 Chronic kidney disease, stage 3 unspecified: Secondary | ICD-10-CM | POA: Insufficient documentation

## 2013-01-14 DIAGNOSIS — D638 Anemia in other chronic diseases classified elsewhere: Secondary | ICD-10-CM | POA: Insufficient documentation

## 2013-01-14 LAB — RENAL FUNCTION PANEL
Albumin: 3.5 g/dL (ref 3.5–5.2)
Calcium: 8.7 mg/dL (ref 8.4–10.5)
GFR calc Af Amer: 16 mL/min — ABNORMAL LOW (ref >90)
GFR calc non Af Amer: 14 mL/min — ABNORMAL LOW (ref >90)
Glucose, Bld: 105 mg/dL — ABNORMAL HIGH (ref 70–99)
Phosphorus: 3.6 mg/dL (ref 2.3–4.6)
Potassium: 4.5 mEq/L (ref 3.5–5.1)
Sodium: 139 mEq/L (ref 135–145)

## 2013-01-14 LAB — IRON AND TIBC
Iron: 50 ug/dL (ref 42–135)
UIBC: 170 ug/dL (ref 125–400)

## 2013-01-14 MED ORDER — EPOETIN ALFA 20000 UNIT/ML IJ SOLN
INTRAMUSCULAR | Status: AC
  Filled 2013-01-14: qty 1

## 2013-01-14 MED ORDER — EPOETIN ALFA 20000 UNIT/ML IJ SOLN
20000.0000 [IU] | INTRAMUSCULAR | Status: DC
  Administered 2013-01-14: 20000 [IU] via SUBCUTANEOUS

## 2013-01-28 ENCOUNTER — Encounter (HOSPITAL_COMMUNITY)
Admission: RE | Admit: 2013-01-28 | Discharge: 2013-01-28 | Disposition: A | Discharge status: Home / Self Care | Payer: PRIVATE HEALTH INSURANCE | Source: Ambulatory Visit | Attending: Nephrology | Admitting: Nephrology

## 2013-01-28 LAB — POCT HEMOGLOBIN-HEMACUE: Hemoglobin: 10 g/dL — ABNORMAL LOW (ref 12.0–15.0)

## 2013-01-28 MED ORDER — EPOETIN ALFA 20000 UNIT/ML IJ SOLN
INTRAMUSCULAR | Status: AC
  Administered 2013-01-28: 20000 [IU] via SUBCUTANEOUS
  Filled 2013-01-28: qty 1

## 2013-01-28 MED ORDER — EPOETIN ALFA 20000 UNIT/ML IJ SOLN: 20000.0000 [IU] | INTRAMUSCULAR | Status: DC

## 2013-01-28 MED ORDER — EPOETIN ALFA 20000 UNIT/ML IJ SOLN
INTRAMUSCULAR | Status: AC
  Filled 2013-01-28: qty 1

## 2013-02-11 ENCOUNTER — Encounter (HOSPITAL_COMMUNITY): Payer: PRIVATE HEALTH INSURANCE

## 2013-02-16 ENCOUNTER — Encounter (HOSPITAL_COMMUNITY): Payer: Self-pay | Admitting: Emergency Medicine

## 2013-02-16 ENCOUNTER — Emergency Department (HOSPITAL_COMMUNITY): Payer: PRIVATE HEALTH INSURANCE

## 2013-02-16 ENCOUNTER — Inpatient Hospital Stay (HOSPITAL_COMMUNITY)
Admission: EM | Admit: 2013-02-16 | Discharge: 2013-02-19 | DRG: 639 | Disposition: A | Discharge status: Home / Self Care | Payer: PRIVATE HEALTH INSURANCE | Attending: Internal Medicine | Admitting: Internal Medicine

## 2013-02-16 DIAGNOSIS — I509 Heart failure, unspecified: Secondary | ICD-10-CM | POA: Diagnosis present

## 2013-02-16 DIAGNOSIS — N184 Chronic kidney disease, stage 4 (severe): Secondary | ICD-10-CM

## 2013-02-16 DIAGNOSIS — I10 Essential (primary) hypertension: Secondary | ICD-10-CM

## 2013-02-16 DIAGNOSIS — E119 Type 2 diabetes mellitus without complications: Secondary | ICD-10-CM

## 2013-02-16 DIAGNOSIS — M069 Rheumatoid arthritis, unspecified: Secondary | ICD-10-CM | POA: Diagnosis present

## 2013-02-16 DIAGNOSIS — L97509 Non-pressure chronic ulcer of other part of unspecified foot with unspecified severity: Secondary | ICD-10-CM

## 2013-02-16 DIAGNOSIS — E669 Obesity, unspecified: Secondary | ICD-10-CM | POA: Diagnosis present

## 2013-02-16 DIAGNOSIS — N039 Chronic nephritic syndrome with unspecified morphologic changes: Secondary | ICD-10-CM | POA: Diagnosis present

## 2013-02-16 DIAGNOSIS — M869 Osteomyelitis, unspecified: Secondary | ICD-10-CM

## 2013-02-16 DIAGNOSIS — D649 Anemia, unspecified: Secondary | ICD-10-CM

## 2013-02-16 DIAGNOSIS — D631 Anemia in chronic kidney disease: Secondary | ICD-10-CM | POA: Diagnosis present

## 2013-02-16 DIAGNOSIS — E11621 Type 2 diabetes mellitus with foot ulcer: Secondary | ICD-10-CM

## 2013-02-16 DIAGNOSIS — Z6836 Body mass index (BMI) 36.0-36.9, adult: Secondary | ICD-10-CM

## 2013-02-16 DIAGNOSIS — E1169 Type 2 diabetes mellitus with other specified complication: Principal | ICD-10-CM | POA: Diagnosis present

## 2013-02-16 DIAGNOSIS — E039 Hypothyroidism, unspecified: Secondary | ICD-10-CM

## 2013-02-16 DIAGNOSIS — I129 Hypertensive chronic kidney disease with stage 1 through stage 4 chronic kidney disease, or unspecified chronic kidney disease: Secondary | ICD-10-CM | POA: Diagnosis present

## 2013-02-16 DIAGNOSIS — T798XXA Other early complications of trauma, initial encounter: Secondary | ICD-10-CM

## 2013-02-16 HISTORY — DX: Non-pressure chronic ulcer of other part of unspecified foot with unspecified severity: L97.509

## 2013-02-16 HISTORY — DX: Age-related osteoporosis without current pathological fracture: M81.0

## 2013-02-16 HISTORY — DX: Personal history of other medical treatment: Z92.89

## 2013-02-16 HISTORY — DX: Type 2 diabetes mellitus with foot ulcer: E11.621

## 2013-02-16 HISTORY — DX: Presence of urogenital implants: Z96.0

## 2013-02-16 HISTORY — DX: Rheumatoid arthritis, unspecified: M06.9

## 2013-02-16 LAB — HEMOGLOBIN A1C
Hgb A1c MFr Bld: 5.6 % (ref <5.7)
Mean Plasma Glucose: 114 mg/dL (ref <117)

## 2013-02-16 LAB — BASIC METABOLIC PANEL
CO2: 22 mEq/L (ref 19–32)
GFR calc non Af Amer: 15 mL/min — ABNORMAL LOW (ref >90)
Glucose, Bld: 140 mg/dL — ABNORMAL HIGH (ref 70–99)
Potassium: 4.3 mEq/L (ref 3.5–5.1)
Sodium: 141 mEq/L (ref 135–145)

## 2013-02-16 LAB — CBC WITH DIFFERENTIAL/PLATELET
Basophils Absolute: 0 10*3/uL (ref 0.0–0.1)
Eosinophils Relative: 3 % (ref 0–5)
HCT: 30.8 % — ABNORMAL LOW (ref 36.0–46.0)
Hemoglobin: 9.7 g/dL — ABNORMAL LOW (ref 12.0–15.0)
Lymphocytes Relative: 14 % (ref 12–46)
Lymphs Abs: 1 10*3/uL (ref 0.7–4.0)
MCV: 88 fL (ref 78.0–100.0)
Monocytes Absolute: 0.5 10*3/uL (ref 0.1–1.0)
Monocytes Relative: 6 % (ref 3–12)
Neutro Abs: 5.8 10*3/uL (ref 1.7–7.7)
RBC: 3.5 MIL/uL — ABNORMAL LOW (ref 3.87–5.11)
RDW: 16.2 % — ABNORMAL HIGH (ref 11.5–15.5)
WBC: 7.5 10*3/uL (ref 4.0–10.5)

## 2013-02-16 LAB — TSH: TSH: 3.814 u[IU]/mL (ref 0.350–4.500)

## 2013-02-16 LAB — CBC
HCT: 29.2 % — ABNORMAL LOW (ref 36.0–46.0)
Hemoglobin: 9.2 g/dL — ABNORMAL LOW (ref 12.0–15.0)
MCH: 27.7 pg (ref 26.0–34.0)
MCHC: 31.5 g/dL (ref 30.0–36.0)
MCV: 88 fL (ref 78.0–100.0)
RBC: 3.32 MIL/uL — ABNORMAL LOW (ref 3.87–5.11)

## 2013-02-16 LAB — GLUCOSE, CAPILLARY
Glucose-Capillary: 114 mg/dL — ABNORMAL HIGH (ref 70–99)
Glucose-Capillary: 106 mg/dL — ABNORMAL HIGH (ref 70–99)

## 2013-02-16 MED ORDER — GLIPIZIDE ER 2.5 MG PO TB24
2.5000 mg | ORAL_TABLET | Freq: Every day | ORAL | Status: DC
  Administered 2013-02-17 – 2013-02-19 (×3): 2.5 mg via ORAL
  Filled 2013-02-16 (×4): qty 1

## 2013-02-16 MED ORDER — VANCOMYCIN HCL 10 G IV SOLR: 1500.0000 mg | INTRAVENOUS | Status: DC

## 2013-02-16 MED ORDER — SIMVASTATIN 5 MG PO TABS
5.0000 mg | ORAL_TABLET | Freq: Every day | ORAL | Status: DC
  Administered 2013-02-16 – 2013-02-18 (×3): 5 mg via ORAL
  Filled 2013-02-16 (×4): qty 1

## 2013-02-16 MED ORDER — AMLODIPINE BESYLATE 10 MG PO TABS
10.0000 mg | ORAL_TABLET | Freq: Every day | ORAL | Status: DC
  Administered 2013-02-17 – 2013-02-19 (×3): 10 mg via ORAL
  Filled 2013-02-16 (×3): qty 1

## 2013-02-16 MED ORDER — ALPRAZOLAM 0.5 MG PO TABS: 0.5000 mg | ORAL_TABLET | Freq: Four times a day (QID) | ORAL | Status: DC | PRN

## 2013-02-16 MED ORDER — PIPERACILLIN-TAZOBACTAM 3.375 G IVPB
3.3750 g | Freq: Three times a day (TID) | INTRAVENOUS | Status: DC
  Administered 2013-02-16: 3.375 g via INTRAVENOUS
  Filled 2013-02-16 (×3): qty 50

## 2013-02-16 MED ORDER — HYDROMORPHONE HCL PF 1 MG/ML IJ SOLN: 1.0000 mg | INTRAMUSCULAR | Status: AC | PRN

## 2013-02-16 MED ORDER — LEVOTHYROXINE SODIUM 100 MCG PO TABS
100.0000 ug | ORAL_TABLET | Freq: Every day | ORAL | Status: DC
Start: 2013-02-17 — End: 2013-02-19
  Administered 2013-02-17 – 2013-02-19 (×3): 100 ug via ORAL
  Filled 2013-02-16 (×3): qty 1

## 2013-02-16 MED ORDER — OXYCODONE HCL 5 MG PO TABS: 5.0000 mg | ORAL_TABLET | ORAL | Status: DC | PRN

## 2013-02-16 MED ORDER — ONDANSETRON HCL 4 MG PO TABS: 4.0000 mg | ORAL_TABLET | Freq: Four times a day (QID) | ORAL | Status: DC | PRN

## 2013-02-16 MED ORDER — INSULIN ASPART 100 UNIT/ML ~~LOC~~ SOLN
0.0000 [IU] | Freq: Three times a day (TID) | SUBCUTANEOUS | Status: DC
  Administered 2013-02-17 (×2): 2 [IU] via SUBCUTANEOUS

## 2013-02-16 MED ORDER — SODIUM CHLORIDE 0.9 % IJ SOLN: 3.0000 mL | INTRAMUSCULAR | Status: DC | PRN

## 2013-02-16 MED ORDER — ACETAMINOPHEN 650 MG RE SUPP: 650.0000 mg | Freq: Four times a day (QID) | RECTAL | Status: DC | PRN

## 2013-02-16 MED ORDER — SODIUM CHLORIDE 0.9 % IJ SOLN
3.0000 mL | Freq: Two times a day (BID) | INTRAMUSCULAR | Status: DC
  Administered 2013-02-16: 3 mL via INTRAVENOUS

## 2013-02-16 MED ORDER — TORSEMIDE 20 MG PO TABS
20.0000 mg | ORAL_TABLET | Freq: Every day | ORAL | Status: DC
  Administered 2013-02-17 – 2013-02-19 (×3): 20 mg via ORAL
  Filled 2013-02-16 (×3): qty 1

## 2013-02-16 MED ORDER — VANCOMYCIN HCL 10 G IV SOLR
2000.0000 mg | Freq: Once | INTRAVENOUS | Status: DC
  Administered 2013-02-16: 2000 mg via INTRAVENOUS
  Filled 2013-02-16: qty 2000

## 2013-02-16 MED ORDER — SENNOSIDES-DOCUSATE SODIUM 8.6-50 MG PO TABS: 1.0000 | ORAL_TABLET | Freq: Every evening | ORAL | Status: DC | PRN

## 2013-02-16 MED ORDER — OMEGA-3-ACID ETHYL ESTERS 1 G PO CAPS
1.0000 g | ORAL_CAPSULE | Freq: Two times a day (BID) | ORAL | Status: DC
  Administered 2013-02-17 – 2013-02-19 (×5): 1 g via ORAL
  Filled 2013-02-16 (×6): qty 1

## 2013-02-16 MED ORDER — ENOXAPARIN SODIUM 30 MG/0.3ML ~~LOC~~ SOLN
30.0000 mg | SUBCUTANEOUS | Status: DC
  Administered 2013-02-16 – 2013-02-18 (×3): 30 mg via SUBCUTANEOUS
  Filled 2013-02-16 (×4): qty 0.3

## 2013-02-16 MED ORDER — OMEGA-3 FATTY ACIDS 1000 MG PO CAPS: 1.0000 | ORAL_CAPSULE | Freq: Every day | ORAL | Status: DC

## 2013-02-16 MED ORDER — INSULIN ASPART 100 UNIT/ML ~~LOC~~ SOLN
4.0000 [IU] | Freq: Three times a day (TID) | SUBCUTANEOUS | Status: DC
  Administered 2013-02-16 – 2013-02-19 (×5): 4 [IU] via SUBCUTANEOUS

## 2013-02-16 MED ORDER — SODIUM CHLORIDE 0.9 % IV SOLN: 250.0000 mL | INTRAVENOUS | Status: DC | PRN

## 2013-02-16 MED ORDER — ACETAMINOPHEN 325 MG PO TABS: 650.0000 mg | ORAL_TABLET | Freq: Four times a day (QID) | ORAL | Status: DC | PRN

## 2013-02-16 MED ORDER — ONDANSETRON HCL 4 MG/2ML IJ SOLN: 4.0000 mg | Freq: Three times a day (TID) | INTRAMUSCULAR | Status: AC | PRN

## 2013-02-16 MED ORDER — ONDANSETRON HCL 4 MG/2ML IJ SOLN: 4.0000 mg | Freq: Four times a day (QID) | INTRAMUSCULAR | Status: DC | PRN

## 2013-02-16 NOTE — ED Notes (Signed)
Patient transported to X-ray 

## 2013-02-16 NOTE — ED Provider Notes (Signed)
History     CSN: [id number]  Arrival date & time 02/16/13  1019   First MD Initiated Contact with Patient 02/16/13 1129      Chief Complaint  Patient presents with  . Wound Infection    (Consider location/radiation/quality/duration/timing/severity/associated sxs/prior treatment) HPI Comments: Patient presents to the ED for bilateral foot infection.  Pt is a diabetic with intermittent foot ulcers.  Patient states that for the past days her feet have been oozing purulent fluid and swelling.  She thinks they have been present for quite some time but she has not noticed. Her daughter came over last night and noticed her feet, and encouraged her to seek medical attention. She also has peripheral vascular disease and wears compression stockings. Patient does not recall a hx of osteomyelitis in her feet.  Patient used to see the wound clinic for her ulcers, however she has not been in over one year.  Patient denies any recent fevers, sweats, or chills. She has been taking all medications as directed. She states her new diabetic shoes have also caused irritation to her feet. She thinks this is where the new wounds have come from.  The history is provided by the patient.    Past Medical History  Diagnosis Date  . CHF (congestive heart failure)      preserved left ventricular systolic function  . Diabetes mellitus   . Chronic kidney disease     creatinine of 2.1 in 2011  . Hypertension   . Overweight(278.02)   . Anemia associated with chronic renal failure     ESA therapy  . Hypothyroid     Past Surgical History  Procedure Laterality Date  . Eye surgery    . Orif femur fracture  1970s    Left; following trauma    Family History  Problem Relation Age of Onset  . Stroke Other   . Heart disease Other   . Kidney disease Other     History  Substance Use Topics  . Smoking status: Never Smoker   . Smokeless tobacco: Never Used  . Alcohol Use: No    OB History   Grav Para Term  Preterm Abortions TAB SAB Ect Mult Living                  Review of Systems  Skin: Positive for wound.  All other systems reviewed and are negative.    Allergies  Iodinated diagnostic agents  Home Medications   Current Outpatient Rx  Name  Route  Sig  Dispense  Refill  . ALPRAZolam (XANAX) 0.5 MG tablet   Oral   Take 0.5 mg by mouth every 6 (six) hours as needed for anxiety.          Marland Kitchen amLODipine (NORVASC) 10 MG tablet   Oral   Take 10 mg by mouth daily.           . fish oil-omega-3 fatty acids 1000 MG capsule   Oral   Take 1 capsule by mouth daily.           Marland Kitchen glipiZIDE (GLUCOTROL XL) 2.5 MG 24 hr tablet   Oral   Take 2.5 mg by mouth daily.           Marland Kitchen levothyroxine (SYNTHROID, LEVOTHROID) 100 MCG tablet   Oral   Take 100 mcg by mouth daily.           . pravastatin (PRAVACHOL) 40 MG tablet   Oral   Take 20 mg by  mouth daily.         Marland Kitchen torsemide (DEMADEX) 20 MG tablet   Oral   Take 20 mg by mouth daily.             BP 144/55  Pulse 72  Temp(Src) 98.2 F (36.8 C) (Oral)  Resp 14  SpO2 98%  Physical Exam  Nursing note and vitals reviewed. Constitutional: She is oriented to person, place, and time. She appears well-developed and well-nourished.  HENT:  Head: Normocephalic and atraumatic.  Mouth/Throat: Oropharynx is clear and moist.  Eyes: Conjunctivae and EOM are normal. Pupils are equal, round, and reactive to light.  Neck: Normal range of motion.  Cardiovascular: Normal rate, regular rhythm and normal heart sounds.   Pulmonary/Chest: Effort normal and breath sounds normal.  Musculoskeletal: Normal range of motion.  Ulceration to right and left 2nd toes with erythema and purulent drainage, strong odor; small abrasion to medial right ankle  Neurological: She is alert and oriented to person, place, and time.  Skin: Skin is warm and dry.  Psychiatric: She has a normal mood and affect.       ED Course  Procedures (including  critical care time)  Labs Reviewed  CBC WITH DIFFERENTIAL - Abnormal; Notable for the following:    RBC 3.50 (*)    Hemoglobin 9.7 (*)    HCT 30.8 (*)    RDW 16.2 (*)    All other components within normal limits  BASIC METABOLIC PANEL - Abnormal; Notable for the following:    Glucose, Bld 140 (*)    BUN 62 (*)    Creatinine, Ser 2.74 (*)    GFR calc non Af Amer 15 (*)    GFR calc Af Amer 18 (*)    All other components within normal limits  CULTURE, BLOOD (ROUTINE X 2)  CULTURE, BLOOD (ROUTINE X 2)   Dg Foot Complete Left  02/16/2013   *RADIOLOGY REPORT*  Clinical Data: Wound infection?   Diabetes mellitus.  Chronic renal disease.  LEFT FOOT - COMPLETE 3+ VIEW  Comparison: None.  Findings: Mild soft tissue swelling of the first through third toes.  No visible osseous destructive lesion.  Skeletal osteopenia. Previous bimalleolar fracture repair, incompletely evaluated.  IMPRESSION: Soft tissue swelling.  No plain films signs of osteomyelitis are evident.   Original Report Authenticated By: Davonna Belling, M.D.   Dg Foot Complete Right  02/16/2013   *RADIOLOGY REPORT*  Clinical Data: Wound infection - first through third toes with pain and swelling.  RIGHT FOOT COMPLETE - 3+ VIEW  Comparison: None  Findings: Diffuse osteopenia/osteoporosis noted. There is no evidence of acute fracture, subluxation or dislocation.  On the lateral view, a probable area of osteolysis is noted in the region of the great toe distal phalanx and osteomyelitis is not excluded. Diffuse soft tissue swelling is present. The Lisfranc joints are intact. A small calcaneal spur and vascular calcifications are noted.  IMPRESSION: Possible osteolysis/osteomyelitis seen on the lateral view in the region of the great toe distal phalanx.  Consider MRI for further evaluation as indicated.  Soft tissue swelling.  Diffuse osteopenia/osteoporosis.   Original Report Authenticated By: Harmon Pier, M.D.     1. Osteomyelitis   2. Wound  infection, initial encounter   3. Anemia   4. Hypertension   5. Diabetes mellitus   6. Hypothyroid       MDM   X-rays with possible osteo of right foot- likely 2nd toe instead of great toe as suggested.  WBC count stable, Cr at pts baseline.  Blood cultures pending.  Consulted hospitalist, Dr. Ardyth Harps.  Pt will be admitted to [facility], med surg.  Vanc and zosyn initiated.  VS stable for transfer.       Garlon Hatchet, PA-C 02/16/13 1614

## 2013-02-16 NOTE — ED Provider Notes (Signed)
Medical screening examination/treatment/procedure(s) were conducted as a shared visit with non-physician practitioner(s) and myself.  I personally evaluated the patient during the encounter  Pt seen and examined--likely osteo, will admit to medicine  Toy Baker, MD 02/16/13 1315

## 2013-02-16 NOTE — H&P (Signed)
[facility]          History and Physical    PCP:   [name], MD   Chief Complaint:  Ulcers on my feet  HPI: Patient is a 79 year old African American obese woman with past medical history significant for diabetes, stage IV chronic kidney disease and chronic diabetic foot ulcers. Per daughter went to see her yesterday and advised that she seek medical treatment for her foot ulcers. Patient states she recently got new shoes and thinks that they have been rubbing the tops of her toes. She denies fever, chills, pain at the site of her ulcers. She had been active before with the wound clinic but has not seen them in over a year. We have been asked to see her for further evaluation and management.  Allergies:   Allergies  Allergen Reactions  . Iodinated Diagnostic Agents Other (See Comments)    REACTION:  unknown      Past Medical History  Diagnosis Date  . CHF (congestive heart failure)      preserved left ventricular systolic function  . Diabetes mellitus   . Chronic kidney disease     creatinine of 2.1 in 2011  . Hypertension   . Overweight(278.02)   . Anemia associated with chronic renal failure     ESA therapy  . Hypothyroid     Past Surgical History  Procedure Laterality Date  . Eye surgery    . Orif femur fracture  1970s    Left; following trauma    Prior to Admission medications   Medication Sig Start Date End Date Taking? Authorizing Provider  ALPRAZolam Prudy Feeler) 0.5 MG tablet Take 0.5 mg by mouth every 6 (six) hours as needed for anxiety.    Yes Historical Provider, MD  amLODipine (NORVASC) 10 MG tablet Take 10 mg by mouth daily.     Yes Historical Provider, MD  fish oil-omega-3 fatty acids 1000 MG capsule Take 1 capsule by mouth daily.     Yes Historical Provider, MD  glipiZIDE (GLUCOTROL XL) 2.5 MG 24 hr tablet Take 2.5 mg by mouth daily.     Yes Historical Provider, MD  levothyroxine (SYNTHROID, LEVOTHROID) 100 MCG tablet Take 100  mcg by mouth daily.     Yes Historical Provider, MD  pravastatin (PRAVACHOL) 40 MG tablet Take 20 mg by mouth daily.   Yes Historical Provider, MD  torsemide (DEMADEX) 20 MG tablet Take 20 mg by mouth daily.     Yes Historical Provider, MD    Social History:  reports that she has never smoked. She has never used smokeless tobacco. She reports that she does not drink alcohol or use illicit drugs.  Family History  Problem Relation Age of Onset  . Stroke Other   . Heart disease Other   . Kidney disease Other     Review of Systems:  Constitutional: Denies fever, chills, diaphoresis, appetite change and fatigue.  HEENT: Denies photophobia, eye pain, redness, hearing loss, ear pain, congestion, sore throat, rhinorrhea, sneezing, mouth sores, trouble swallowing, neck pain, neck stiffness and tinnitus.   Respiratory: Denies SOB, DOE, cough, chest tightness,  and wheezing.   Cardiovascular: Denies chest pain, palpitations and leg swelling.  Gastrointestinal: Denies nausea, vomiting, abdominal pain, diarrhea, constipation, blood in stool and abdominal distention.  Genitourinary: Denies dysuria, urgency, frequency, hematuria, flank pain and difficulty urinating.  Endocrine: Denies: hot or cold intolerance, sweats, changes in hair or nails, polyuria, polydipsia. Musculoskeletal: Denies myalgias, back pain, joint swelling, arthralgias and  gait problem.  Skin: Denies pallor, rash and wound.  Neurological: Denies dizziness, seizures, syncope, weakness, light-headedness, numbness and headaches.  Hematological: Denies adenopathy. Easy bruising, personal or family bleeding history  Psychiatric/Behavioral: Denies suicidal ideation, mood changes, confusion, nervousness, sleep disturbance and agitation   Physical Exam: Blood pressure 128/50, pulse 69, temperature 98.2 F (36.8 C), temperature source Oral, resp. rate 14, height 5\' 6"  (1.676 m), weight 105.235 kg (232 lb), SpO2 99.00%. General: Alert,  awake, oriented x3, in no distress. HEENT: Normocephalic, atraumatic, pupils equal round reactive to light, extraocular movements intact, moist mucous membranes. Neck: Supple, no JVD, no lymphadenopathy, no bruits, no goiter. Cardiovascular: Regular rate and rhythm, prominent systolic ejection murmur best heard at the right upper sternal border, no rubs or gallops. Lungs: Clear to auscultation bilaterally. Abdomen: Obese, soft, nontender, nondistended, positive bowel sounds, no masses or organomegaly noted. Extremities: 1-2+ pitting edema bilaterally. She has wounds over both left and right second toe is with mental order is purulent/serous discharge. Neurologic: Grossly intact and nonfocal. I have not ambulated her.  Labs on Admission:  Results for orders placed during the hospital encounter of 02/16/13 (from the past 48 hour(s))  CBC WITH DIFFERENTIAL     Status: Abnormal   Collection Time    02/16/13 12:12 PM      Result Value Range   WBC 7.5  4.0 - 10.5 K/uL   RBC 3.50 (*) 3.87 - 5.11 MIL/uL   Hemoglobin 9.7 (*) 12.0 - 15.0 g/dL   HCT 16.1 (*) 09.6 - 04.5 %   MCV 88.0  78.0 - 100.0 fL   MCH 27.7  26.0 - 34.0 pg   MCHC 31.5  30.0 - 36.0 g/dL   RDW 40.9 (*) 81.1 - 91.4 %   Platelets 171  150 - 400 K/uL   Neutrophils Relative % 77  43 - 77 %   Neutro Abs 5.8  1.7 - 7.7 K/uL   Lymphocytes Relative 14  12 - 46 %   Lymphs Abs 1.0  0.7 - 4.0 K/uL   Monocytes Relative 6  3 - 12 %   Monocytes Absolute 0.5  0.1 - 1.0 K/uL   Eosinophils Relative 3  0 - 5 %   Eosinophils Absolute 0.2  0.0 - 0.7 K/uL   Basophils Relative 0  0 - 1 %   Basophils Absolute 0.0  0.0 - 0.1 K/uL  BASIC METABOLIC PANEL     Status: Abnormal   Collection Time    02/16/13 12:12 PM      Result Value Range   Sodium 141  135 - 145 mEq/L   Potassium 4.3  3.5 - 5.1 mEq/L   Chloride 108  96 - 112 mEq/L   CO2 22  19 - 32 mEq/L   Glucose, Bld 140 (*) 70 - 99 mg/dL   BUN 62 (*) 6 - 23 mg/dL   Creatinine, Ser 7.82 (*)  0.50 - 1.10 mg/dL   Calcium 8.6  8.4 - 95.6 mg/dL   GFR calc non Af Amer 15 (*) >90 mL/min   GFR calc Af Amer 18 (*) >90 mL/min   Comment:            The eGFR has been calculated     using the CKD EPI equation.     This calculation has not been     validated in all clinical     situations.     eGFR's persistently     <90 mL/min signify  possible Chronic Kidney Disease.    Radiological Exams on Admission: Dg Foot Complete Left  02/16/2013   *RADIOLOGY REPORT*  Clinical Data: Wound infection?   Diabetes mellitus.  Chronic renal disease.  LEFT FOOT - COMPLETE 3+ VIEW  Comparison: None.  Findings: Mild soft tissue swelling of the first through third toes.  No visible osseous destructive lesion.  Skeletal osteopenia. Previous bimalleolar fracture repair, incompletely evaluated.  IMPRESSION: Soft tissue swelling.  No plain films signs of osteomyelitis are evident.   Original Report Authenticated By: Davonna Belling, M.D.   Dg Foot Complete Right  02/16/2013   *RADIOLOGY REPORT*  Clinical Data: Wound infection - first through third toes with pain and swelling.  RIGHT FOOT COMPLETE - 3+ VIEW  Comparison: None  Findings: Diffuse osteopenia/osteoporosis noted. There is no evidence of acute fracture, subluxation or dislocation.  On the lateral view, a probable area of osteolysis is noted in the region of the great toe distal phalanx and osteomyelitis is not excluded. Diffuse soft tissue swelling is present. The Lisfranc joints are intact. A small calcaneal spur and vascular calcifications are noted.  IMPRESSION: Possible osteolysis/osteomyelitis seen on the lateral view in the region of the great toe distal phalanx.  Consider MRI for further evaluation as indicated.  Soft tissue swelling.  Diffuse osteopenia/osteoporosis.   Original Report Authenticated By: Harmon Pier, M.D.    Assessment/Plan Principal Problem:   Diabetic foot ulcers Active Problems:   DIABETES MELLITUS   Chronic kidney disease, stage  4, severely decreased GFR   Hypertension   Hypothyroid   Diabetic foot ulcers -On on plain film there is concern for osteomyelitis of the first big toe. -Blood cultures and wound cultures have been ordered by the emergency department. -Will start vancomycin and Zosyn as dosed per pharmacy. -Have discussed case with orthopedics, Dr. Luiz Blare who will see patient in consultation. He will decide whether MRI is required. Of note patient has metal in her left foot from a prior surgery.  Diabetes mellitus -Check A1c. -Continue glipizide and start sliding scale insulin while in the hospital.  Chronic kidney disease stage IV -Creatinine at baseline.  Hypothyroidism -Check TSH. -Continue home dose of Synthroid.  Hypertension -Fair control at present. -Continue home medications and adjust as needed.  DVT prophylaxis -Lovenox.  Time Spent on Admission: 80 minutes  HERNANDEZ ACOSTA,ESTELA [facility] Pager: [phone] 02/16/2013, 2:52 PM

## 2013-02-16 NOTE — ED Notes (Signed)
Pt reports poor healing to lower feet for the last several months. States wounds have recently been oozing and swelling. Distal circulation delayed in lower extremities.

## 2013-02-16 NOTE — ED Notes (Signed)
PT reports swelling to ankle area with abrasion for several days. Reports that she has been seen at the wound clinic before but was discharged. Called PCP today and was told to come in for evaluation.

## 2013-02-16 NOTE — ED Notes (Signed)
Pt assisted on bedpan.

## 2013-02-16 NOTE — ED Notes (Signed)
Admitting MD at bedside.

## 2013-02-16 NOTE — Progress Notes (Signed)
ANTIBIOTIC CONSULT NOTE - INITIAL  Pharmacy Consult for Vancomycin/Zosyn Indication: Chronic right foot infection  Allergies  Allergen Reactions  . Iodinated Diagnostic Agents Other (See Comments)    REACTION:  unknown    Patient Measurements:   Ht: 5 ft 6 in Wt: 105 kg  Vital Signs: Temp: 98.2 F (36.8 C) (06/09 1022) Temp src: Oral (06/09 1022) BP: 143/51 mmHg (06/09 1200) Pulse Rate: 75 (06/09 1200) Intake/Output from previous day:   Intake/Output from this shift:    Labs:  Recent Labs  02/16/13 1212  WBC 7.5  HGB 9.7*  PLT 171  CREATININE 2.74*   The CrCl is unknown because both a height and weight (above a minimum accepted value) are required for this calculation. No results found for this basename: VANCOTROUGH, VANCOPEAK, VANCORANDOM, GENTTROUGH, GENTPEAK, GENTRANDOM, TOBRATROUGH, TOBRAPEAK, TOBRARND, AMIKACINPEAK, AMIKACINTROU, AMIKACIN,  in the last 72 hours   Microbiology: No results found for this or any previous visit (from the past 720 hour(s)).  Medical History: Past Medical History  Diagnosis Date  . CHF (congestive heart failure)      preserved left ventricular systolic function  . Diabetes mellitus   . Chronic kidney disease     creatinine of 2.1 in 2011  . Hypertension   . Overweight(278.02)   . Anemia associated with chronic renal failure     ESA therapy  . Hypothyroid     Assessment: 79 y/o F presents with bilateral LE infections. Over the last several days her wounds have been oozing purulent fluid. Plain film shows no signs of osteo. WBC 7.5, Tmax 98.2, CKD with Scr 2.74, CrCl ~53ml/min.   Goal of Therapy:  Vancomycin trough level 15-20 mcg/ml  Plan:  - Vancomycin 2000 mg IV x 1 - Start vancomycin 1500 mg IV q48h - Zosyn 3.375G IV q8h to be infused over 4 hours - Trend WBC, temp, renal function, cultures - F/U length of treatment, change to PO  Abran Duke, PharmD Clinical Pharmacist Phone: [phone] Pager:  [phone] 02/16/2013 2:07 PM

## 2013-02-16 NOTE — ED Notes (Addendum)
Crackers and OJ given at this time; pt took diabetes meds this AM

## 2013-02-17 ENCOUNTER — Inpatient Hospital Stay (HOSPITAL_COMMUNITY): Payer: PRIVATE HEALTH INSURANCE

## 2013-02-17 DIAGNOSIS — L97509 Non-pressure chronic ulcer of other part of unspecified foot with unspecified severity: Secondary | ICD-10-CM

## 2013-02-17 DIAGNOSIS — E1169 Type 2 diabetes mellitus with other specified complication: Principal | ICD-10-CM

## 2013-02-17 DIAGNOSIS — E039 Hypothyroidism, unspecified: Secondary | ICD-10-CM

## 2013-02-17 DIAGNOSIS — I10 Essential (primary) hypertension: Secondary | ICD-10-CM

## 2013-02-17 LAB — BASIC METABOLIC PANEL
Calcium: 7.9 mg/dL — ABNORMAL LOW (ref 8.4–10.5)
GFR calc Af Amer: 18 mL/min — ABNORMAL LOW (ref >90)
GFR calc non Af Amer: 16 mL/min — ABNORMAL LOW (ref >90)
Glucose, Bld: 104 mg/dL — ABNORMAL HIGH (ref 70–99)
Potassium: 4.5 mEq/L (ref 3.5–5.1)
Sodium: 141 mEq/L (ref 135–145)

## 2013-02-17 LAB — CBC
Hemoglobin: 8.4 g/dL — ABNORMAL LOW (ref 12.0–15.0)
MCH: 27.8 pg (ref 26.0–34.0)
Platelets: 156 10*3/uL (ref 150–400)
RBC: 3.02 MIL/uL — ABNORMAL LOW (ref 3.87–5.11)

## 2013-02-17 LAB — GLUCOSE, CAPILLARY
Glucose-Capillary: 121 mg/dL — ABNORMAL HIGH (ref 70–99)
Glucose-Capillary: 55 mg/dL — ABNORMAL LOW (ref 70–99)

## 2013-02-17 MED ORDER — VANCOMYCIN HCL 10 G IV SOLR
1500.0000 mg | INTRAVENOUS | Status: DC
  Administered 2013-02-18: 1500 mg via INTRAVENOUS
  Filled 2013-02-17: qty 1500

## 2013-02-17 MED ORDER — PIPERACILLIN-TAZOBACTAM 3.375 G IVPB
3.3750 g | Freq: Three times a day (TID) | INTRAVENOUS | Status: DC
  Administered 2013-02-17 – 2013-02-19 (×7): 3.375 g via INTRAVENOUS
  Filled 2013-02-17 (×10): qty 50

## 2013-02-17 NOTE — Progress Notes (Signed)
[facility] PROGRESS NOTE  [name] UJW:[id number] DOB: 1932-06-02 DOA: 02/16/2013 PCP: [name], MD  Assessment/Plan: Diabetic foot ulcers  -On foot plain film there was concern for osteomyelitis of the first big toe and couldn't r/o abnormalities of 2nd toe (where patient had open wound)  -Blood cultures and wound cultures have been ordered by the emergency department and so far no growth. -Will continue vancomycin and Zosyn as dosed per pharmacy for 1-2 more days and ask WOC service to evaluate patient and provide rec's. -Have discussed case with orthopedics, Dr. Luiz Blare; after reviewing MRI result no surgery is needed at this point; has recommended 1-2 more days IV antibiotics and then complete a total of 10 days (PO regimen with doxycycline and follow with him at his office)  Diabetes mellitus  -A1c 5.6 -Continue glipizide and sliding scale insulin while in the hospital.   Chronic kidney disease stage IV  -Creatinine at baseline.  -will monitor  Hypothyroidism  -TSH WNL -Continue home dose of Synthroid.   Hypertension  -stable and well controlled at present -Continue home medications and adjust as needed.   DVT prophylaxis  -continue Lovenox.   Code Status: Full Family Communication: daughter at bedside Disposition Plan: home in 1-2 days   Consultants:  Dr. Luiz Blare (orthopedist)  Procedures: MRI (Negative for osteomyelitis or soft tissue abscess. Dorsal forefoot predominant lateral subcutaneous edema most compatible with cellulitis in the appropriate clinical setting)  Antibiotics:  vanc and zosyn  HPI/Subjective: Afebrile, no CP or SOB.  Objective: Filed Vitals:   02/16/13 1521 02/16/13 2141 02/17/13 0451 02/17/13 1440  BP: 140/100 135/37 150/56 148/50  Pulse: 70 72 63 65  Temp: 98.4 F (36.9 C) 98.2 F (36.8 C) 99 F (37.2 C) 98.7 F (37.1 C)  TempSrc: Oral Oral Oral Oral  Resp: 18 18 18 20   Height: 5\' 6"  (1.676 m)     Weight:  103.284 kg (227 lb 11.2 oz)     SpO2: 95% 98% 98% 99%    Intake/Output Summary (Last 24 hours) at 02/17/13 1712 Last data filed at 02/17/13 1440  Gross per 24 hour  Intake    815 ml  Output   1200 ml  Net   -385 ml   Filed Weights   02/16/13 1413 02/16/13 1521  Weight: 105.235 kg (232 lb) 103.284 kg (227 lb 11.2 oz)    Exam:   General:  NAD, afebrile  Cardiovascular: rate control, no rubs or gallops; positive SEM  Respiratory: CTA bilaterally  Abdomen: soft, NT, ND positive BS  Musculoskeletal: no joint swelling or erythema; RLE with 2nd toe with open wound and mild drainage, no erythema.  Data Reviewed: Basic Metabolic Panel:  Recent Labs Lab 02/16/13 1212 02/16/13 1609 02/17/13 0700  NA 141  --  141  K 4.3  --  4.5  CL 108  --  112  CO2 22  --  22  GLUCOSE 140*  --  104*  BUN 62*  --  57*  CREATININE 2.74* 2.70* 2.69*  CALCIUM 8.6  --  7.9*   CBC:  Recent Labs Lab 02/16/13 1212 02/16/13 1609 02/17/13 0700  WBC 7.5 7.4 7.0  NEUTROABS 5.8  --   --   HGB 9.7* 9.2* 8.4*  HCT 30.8* 29.2* 26.8*  MCV 88.0 88.0 88.7  PLT 171 168 156   CBG:  Recent Labs Lab 02/16/13 1631 02/16/13 2141 02/17/13 0917 02/17/13 1145 02/17/13 1707  GLUCAP 114* 106* 121* 123* 55*    Recent  Results (from the past 240 hour(s))  CULTURE, BLOOD (ROUTINE X 2)     Status: None   Collection Time    02/16/13 12:00 PM      Result Value Range Status   Specimen Description BLOOD LEFT ARM   Final   Special Requests BOTTLES DRAWN AEROBIC ONLY 6CC   Final   Culture  Setup Time 02/16/2013 16:38   Final   Culture     Final   Value:        BLOOD CULTURE RECEIVED NO GROWTH TO DATE CULTURE WILL BE HELD FOR 5 DAYS BEFORE ISSUING A FINAL NEGATIVE REPORT   Report Status PENDING   Incomplete  CULTURE, BLOOD (ROUTINE X 2)     Status: None   Collection Time    02/16/13 12:05 PM      Result Value Range Status   Specimen Description BLOOD RIGHT ARM   Final   Special Requests BOTTLES  DRAWN AEROBIC ONLY 10CC   Final   Culture  Setup Time 02/16/2013 16:38   Final   Culture     Final   Value:        BLOOD CULTURE RECEIVED NO GROWTH TO DATE CULTURE WILL BE HELD FOR 5 DAYS BEFORE ISSUING A FINAL NEGATIVE REPORT   Report Status PENDING   Incomplete     Studies: Mr Foot Right Wo Contrast  02/17/2013   *RADIOLOGY REPORT*  Clinical Data: Wound of the dorsum of the foot.  MRI OF THE RIGHT FOREFOOT WITHOUT CONTRAST  Technique:  Multiplanar, multisequence MR imaging was performed. No intravenous contrast was administered.  Comparison: Plain films 02/16/2013.  Findings: There is intense subcutaneous edema about the dorsum of the foot.  More focal appearing fluid is seen in the subcutaneous tissues over the dorsum of the third and fourth metatarsals.  This may represent an abscess measuring 2.6 cm transverse by 1 cm cranial-caudal by 5.4 cm long.  Overlying skin appears thickened. There is no bone marrow signal abnormality to suggest osteomyelitis.  No fluid collection within the deep tissues of the foot is identified. Bony irregularity of the medial margin of the base of the fourth metatarsal and lateral margin of the base of the third metatarsal is incompletely visualized and may be secondary to gout.  There is no surrounding soft tissue or marrow edema.  IMPRESSION:  1.  Intense subcutaneous edema over the dorsum of the foot is compatible with cellulitis.  More focal appearing subcutaneous fluid over the dorsum of the third and fourth metatarsals may represent abscess formation. 2.  Negative for osteomyelitis or abscess within the deep soft tissues of the foot.   Original Report Authenticated By: Holley Dexter, M.D.   Mr Foot Left Wo Contrast  02/17/2013   *RADIOLOGY REPORT*  Clinical Data: Osteomyelitis of the second and third toes.  MRI OF THE LEFT FOREFOOT WITHOUT CONTRAST  Technique:  Multiplanar, multisequence MR imaging was performed. No intravenous contrast was administered.   Comparison: None.  Findings: Diffuse fatty atrophy of the plantar foot musculature is present.  Dorsal subcutaneous edema is present in the lateral aspect of the forefoot.  Apparent ulceration over the dorsal aspect of the third and fourth distal metatarsals.  There is no bone marrow edema, cortical erosion or evidence of osteomyelitis. Flexor tendons appear intact.  Extensor tendons appear intact. Moderate first MTP joint osteoarthritis.  No fracture.  Visualized midfoot appears within normal limits without erosions.  IMPRESSION: Negative for osteomyelitis or soft tissue abscess.  Dorsal  forefoot predominant lateral subcutaneous edema most compatible with cellulitis in the appropriate clinical setting.   Original Report Authenticated By: Andreas Newport, M.D.   Dg Foot Complete Left  02/16/2013   *RADIOLOGY REPORT*  Clinical Data: Wound infection?   Diabetes mellitus.  Chronic renal disease.  LEFT FOOT - COMPLETE 3+ VIEW  Comparison: None.  Findings: Mild soft tissue swelling of the first through third toes.  No visible osseous destructive lesion.  Skeletal osteopenia. Previous bimalleolar fracture repair, incompletely evaluated.  IMPRESSION: Soft tissue swelling.  No plain films signs of osteomyelitis are evident.   Original Report Authenticated By: Davonna Belling, M.D.   Dg Foot Complete Right  02/16/2013   *RADIOLOGY REPORT*  Clinical Data: Wound infection - first through third toes with pain and swelling.  RIGHT FOOT COMPLETE - 3+ VIEW  Comparison: None  Findings: Diffuse osteopenia/osteoporosis noted. There is no evidence of acute fracture, subluxation or dislocation.  On the lateral view, a probable area of osteolysis is noted in the region of the great toe distal phalanx and osteomyelitis is not excluded. Diffuse soft tissue swelling is present. The Lisfranc joints are intact. A small calcaneal spur and vascular calcifications are noted.  IMPRESSION: Possible osteolysis/osteomyelitis seen on the lateral view  in the region of the great toe distal phalanx.  Consider MRI for further evaluation as indicated.  Soft tissue swelling.  Diffuse osteopenia/osteoporosis.   Original Report Authenticated By: Harmon Pier, M.D.    Scheduled Meds: . amLODipine  10 mg Oral Daily  . enoxaparin (LOVENOX) injection  30 mg Subcutaneous Q24H  . glipiZIDE  2.5 mg Oral Q breakfast  . insulin aspart  0-15 Units Subcutaneous TID WC  . insulin aspart  4 Units Subcutaneous TID WC  . levothyroxine  100 mcg Oral QAC breakfast  . omega-3 acid ethyl esters  1 g Oral BID  . piperacillin-tazobactam (ZOSYN)  IV  3.375 g Intravenous Q8H  . simvastatin  5 mg Oral q1800  . sodium chloride  3 mL Intravenous Q12H  . torsemide  20 mg Oral Daily  . [START ON 02/18/2013] vancomycin  1,500 mg Intravenous Q48H   Continuous Infusions:   Principal Problem:   Diabetic foot ulcers Active Problems:   DIABETES MELLITUS   Chronic kidney disease, stage 4, severely decreased GFR   Hypertension   Hypothyroid    Time spent: >30 minutes    Kriste Broman  [facility] Pager [phone]. If 7PM-7AM, please contact night-coverage at [email or web address], password [facility] 02/17/2013, 5:12 PM  LOS: 1 day

## 2013-02-17 NOTE — Progress Notes (Signed)
UR COMPLETED  

## 2013-02-17 NOTE — Consult Note (Signed)
Reason for Consult:bilateral second and third toe concern for osteomyelitis Referring Physician: hospitalists  [name] is an 79 y.o. female.  HPI: the patient is a 79 year old female diabetic who over the last several months has had some drainage from her right second toe.  She's had some duskiness of the second third toes on both feet and she has been keeping this or her family.  Today her family identified these issues and brought her to the emergency room because of his concern of the coloration of her second and third toes.  They noted the area of drainage over the second toe on the right foot.  She states this been going on for a month but her family states it may be 3-4 months.  She has not had previous issues of surgery related to diabetic foot problems.  Past Medical History  Diagnosis Date  . CHF (congestive heart failure)      preserved left ventricular systolic function  . Hypertension   . Overweight(278.02)   . Hypothyroid   . Vaginal pessary present ~ 2012  . CKD (chronic kidney disease) stage 4, GFR 15-29 ml/min     [name] 02/16/2013  . Type II diabetes mellitus   . Anemia associated with chronic renal failure     ESA therapy  . History of blood transfusion 1975  . Rheumatoid arthritis   . Osteoporosis   . Pneumonia 02/2011    [name] 02/20/2011 (02/16/2013)  . Diabetic foot ulcers     "get them on borh feet" (02/16/2013)    Past Surgical History  Procedure Laterality Date  . Cataract extraction w/ intraocular lens  implant, bilateral Bilateral 1990's  . Orif femur fracture Left 1975    "got a rod and screw in it" (02/16/2013)    Family History  Problem Relation Age of Onset  . Stroke Other   . Heart disease Other   . Kidney disease Other     Social History:  reports that she has never smoked. She has never used smokeless tobacco. She reports that she does not drink alcohol or use illicit drugs.  Allergies:  Allergies  Allergen Reactions  . Iodinated  Diagnostic Agents Other (See Comments)    REACTION:  unknown    Medications: I have reviewed the patient's current medications.  Results for orders placed during the hospital encounter of 02/16/13 (from the past 48 hour(s))  CBC WITH DIFFERENTIAL     Status: Abnormal   Collection Time    02/16/13 12:12 PM      Result Value Range   WBC 7.5  4.0 - 10.5 K/uL   RBC 3.50 (*) 3.87 - 5.11 MIL/uL   Hemoglobin 9.7 (*) 12.0 - 15.0 g/dL   HCT 16.1 (*) 09.6 - 04.5 %   MCV 88.0  78.0 - 100.0 fL   MCH 27.7  26.0 - 34.0 pg   MCHC 31.5  30.0 - 36.0 g/dL   RDW 40.9 (*) 81.1 - 91.4 %   Platelets 171  150 - 400 K/uL   Neutrophils Relative % 77  43 - 77 %   Neutro Abs 5.8  1.7 - 7.7 K/uL   Lymphocytes Relative 14  12 - 46 %   Lymphs Abs 1.0  0.7 - 4.0 K/uL   Monocytes Relative 6  3 - 12 %   Monocytes Absolute 0.5  0.1 - 1.0 K/uL   Eosinophils Relative 3  0 - 5 %   Eosinophils Absolute 0.2  0.0 - 0.7  K/uL   Basophils Relative 0  0 - 1 %   Basophils Absolute 0.0  0.0 - 0.1 K/uL  BASIC METABOLIC PANEL     Status: Abnormal   Collection Time    02/16/13 12:12 PM      Result Value Range   Sodium 141  135 - 145 mEq/L   Potassium 4.3  3.5 - 5.1 mEq/L   Chloride 108  96 - 112 mEq/L   CO2 22  19 - 32 mEq/L   Glucose, Bld 140 (*) 70 - 99 mg/dL   BUN 62 (*) 6 - 23 mg/dL   Creatinine, Ser 1.61 (*) 0.50 - 1.10 mg/dL   Calcium 8.6  8.4 - 09.6 mg/dL   GFR calc non Af Amer 15 (*) >90 mL/min   GFR calc Af Amer 18 (*) >90 mL/min   Comment:            The eGFR has been calculated     using the CKD EPI equation.     This calculation has not been     validated in all clinical     situations.     eGFR's persistently     <90 mL/min signify     possible Chronic Kidney Disease.  CBC     Status: Abnormal   Collection Time    02/16/13  4:09 PM      Result Value Range   WBC 7.4  4.0 - 10.5 K/uL   RBC 3.32 (*) 3.87 - 5.11 MIL/uL   Hemoglobin 9.2 (*) 12.0 - 15.0 g/dL   HCT 04.5 (*) 40.9 - 81.1 %   MCV 88.0   78.0 - 100.0 fL   MCH 27.7  26.0 - 34.0 pg   MCHC 31.5  30.0 - 36.0 g/dL   RDW 91.4 (*) 78.2 - 95.6 %   Platelets 168  150 - 400 K/uL  CREATININE, SERUM     Status: Abnormal   Collection Time    02/16/13  4:09 PM      Result Value Range   Creatinine, Ser 2.70 (*) 0.50 - 1.10 mg/dL   GFR calc non Af Amer 16 (*) >90 mL/min   GFR calc Af Amer 18 (*) >90 mL/min   Comment:            The eGFR has been calculated     using the CKD EPI equation.     This calculation has not been     validated in all clinical     situations.     eGFR's persistently     <90 mL/min signify     possible Chronic Kidney Disease.  HEMOGLOBIN A1C     Status: None   Collection Time    02/16/13  4:09 PM      Result Value Range   Hemoglobin A1C 5.6  <5.7 %   Comment: (NOTE)                                                                               According to the ADA Clinical Practice Recommendations for 2011, when     HbA1c is used as a screening test:      >=6.5%  Diagnostic of Diabetes Mellitus               (if abnormal result is confirmed)     5.7-6.4%   Increased risk of developing Diabetes Mellitus     References:Diagnosis and Classification of Diabetes Mellitus,Diabetes     Care,2011,34(Suppl 1):S62-S69 and Standards of Medical Care in             Diabetes - 2011,Diabetes Care,2011,34 (Suppl 1):S11-S61.   Mean Plasma Glucose 114  <117 mg/dL  TSH     Status: None   Collection Time    02/16/13  4:09 PM      Result Value Range   TSH 3.814  0.350 - 4.500 uIU/mL  GLUCOSE, CAPILLARY     Status: Abnormal   Collection Time    02/16/13  4:31 PM      Result Value Range   Glucose-Capillary 114 (*) 70 - 99 mg/dL  GLUCOSE, CAPILLARY     Status: Abnormal   Collection Time    02/16/13  9:41 PM      Result Value Range   Glucose-Capillary 106 (*) 70 - 99 mg/dL    Dg Foot Complete Left  02/16/2013   *RADIOLOGY REPORT*  Clinical Data: Wound infection?   Diabetes mellitus.  Chronic renal disease.  LEFT  FOOT - COMPLETE 3+ VIEW  Comparison: None.  Findings: Mild soft tissue swelling of the first through third toes.  No visible osseous destructive lesion.  Skeletal osteopenia. Previous bimalleolar fracture repair, incompletely evaluated.  IMPRESSION: Soft tissue swelling.  No plain films signs of osteomyelitis are evident.   Original Report Authenticated By: Davonna Belling, M.D.   Dg Foot Complete Right  02/16/2013   *RADIOLOGY REPORT*  Clinical Data: Wound infection - first through third toes with pain and swelling.  RIGHT FOOT COMPLETE - 3+ VIEW  Comparison: None  Findings: Diffuse osteopenia/osteoporosis noted. There is no evidence of acute fracture, subluxation or dislocation.  On the lateral view, a probable area of osteolysis is noted in the region of the great toe distal phalanx and osteomyelitis is not excluded. Diffuse soft tissue swelling is present. The Lisfranc joints are intact. A small calcaneal spur and vascular calcifications are noted.  IMPRESSION: Possible osteolysis/osteomyelitis seen on the lateral view in the region of the great toe distal phalanx.  Consider MRI for further evaluation as indicated.  Soft tissue swelling.  Diffuse osteopenia/osteoporosis.   Original Report Authenticated By: Harmon Pier, M.D.   ROS: I have reviewed the patient's review of systems thoroughly and there are no positive responses as relates to the HPI. Exam:  Blood pressure 135/37, pulse 72, temperature 98.2 F (36.8 C), temperature source Oral, resp. rate 18, height 5\' 6"  (1.676 m), weight 103.284 kg (227 lb 11.2 oz), SpO2 98.00%. Well-developed well-nourished patient in no acute distress. Alert and oriented x3 HEENT:within normal limits Cardiac: Regular rate and rhythm Pulmonary: Lungs clear to auscultation Abdomen: Soft and nontender.  Normal active bowel sounds  Musculoskeletal:right foot: A punctate wound over the PIP joint of the second toe.  There is a slight amount of drainage from this area.  Third  toe has discoloration and fusiform swelling but no obvious area of skin breach or drainage.  Left foot: Dusky color to second and third toes.  No obvious skin breach and no obvious drainage.  Full range of motion of ankle.  There is moderate soft tissue swelling throughout the foot and ankle.  Assessment/Plan: 79 year old diabetic female with obvious area of drainage  from the second toe right foot.  There is discoloration of the second third toes bilaterally.//Ultimately I think the patient needs to undergo MRI examination of both feet to assess for osteomyelitis.  I am very much concerned that the second toe on the right foot will need amputation.  I discussed this with her and her family at length.  I will await the MRI results for any further decision making.I have had a prolonged discussion with the patient regarding the risk and benefits of the surgical procedure.  The patient understands the risks include but are not limited to bleeding, infection and failure of the surgery to cure the problem and need for further surgery.  The patient understands there is a slight risk of death at the time of surgery.  The patient understands these risks along with the potential benefits and wishes to proceed with surgical intervention. .  The patient will be followed in the office in the postoperative period.while as not capsule is certain she will need to undergo surgical intervention.  I am very much concerned based on this punctate area of drainage of the second toe on the right foot the distal be necessary.I will follow her in the hospital reviewed her MRIs when completed.  Rithwik Schmieg L 02/17/2013, 1:45 AM

## 2013-02-17 NOTE — Progress Notes (Signed)
Awaiting MRI results of right foot.  We'll check results later today.  Treatment plan will be determined at that time.

## 2013-02-17 NOTE — ED Provider Notes (Signed)
Medical screening examination/treatment/procedure(s) were conducted as a shared visit with non-physician practitioner(s) and myself.  I personally evaluated the patient during the encounter  Toy Baker, MD 02/17/13 2009

## 2013-02-17 NOTE — Progress Notes (Signed)
Hypoglycemic Event  CBG: 55  Treatment: 15 GM carbohydrate snack  Symptoms: "SLEEPY"  Follow-up CBG: Time:1754 CBG Result:80  Possible Reasons for Event: Medication regimen: PT DOES NOT TAKE INSULIN AT HOME  Comments/MD notified:MADERA @ 1845    Lynzee Lindquist B  Remember to initiate Hypoglycemia Order Set & complete

## 2013-02-18 DIAGNOSIS — D649 Anemia, unspecified: Secondary | ICD-10-CM

## 2013-02-18 LAB — GLUCOSE, CAPILLARY
Glucose-Capillary: 112 mg/dL — ABNORMAL HIGH (ref 70–99)
Glucose-Capillary: 118 mg/dL — ABNORMAL HIGH (ref 70–99)
Glucose-Capillary: 94 mg/dL (ref 70–99)

## 2013-02-18 NOTE — Progress Notes (Signed)
Subjective: Pt without complaints today   Objective: Vital signs in last 24 hours: Temp:  [97.3 F (36.3 C)-98.7 F (37.1 C)] 98.3 F (36.8 C) (06/11 0530) Pulse Rate:  [65-74] 68 (06/11 0530) Resp:  [16-20] 18 (06/11 0530) BP: (128-148)/(48-63) 128/48 mmHg (06/11 0530) SpO2:  [99 %-100 %] 99 % (06/11 0530)  Intake/Output from previous day: 06/10 0701 - 06/11 0700 In: 498 [P.O.:240; I.V.:95; IV Piggyback:163] Out: 2200 [Urine:2200] Intake/Output this shift:     Recent Labs  02/16/13 1212 02/16/13 1609 02/17/13 0700  HGB 9.7* 9.2* 8.4*    Recent Labs  02/16/13 1609 02/17/13 0700  WBC 7.4 7.0  RBC 3.32* 3.02*  HCT 29.2* 26.8*  PLT 168 156    Recent Labs  02/16/13 1212 02/16/13 1609 02/17/13 0700  NA 141  --  141  K 4.3  --  4.5  CL 108  --  112  CO2 22  --  22  BUN 62*  --  57*  CREATININE 2.74* 2.70* 2.69*  GLUCOSE 140*  --  104*  CALCIUM 8.6  --  7.9*   No results found for this basename: LABPT, INR,  in the last 72 hours  Neurologically intact ABD soft Neurovascular intact Sensation intact distally Intact pulses distally Dorsiflexion/Plantar flexion intact No cellulitis present Compartment soft small punctate area of drainage over 2nd toe r foot  Assessment/Plan: bilat foot swelling without evidence af abscess or osteo// pt will likely neeed 2nd toe amputation r. 2nd toe in future but OK to trty IV abx followed by oral to see if wound will settle down and heal.  Will follow as OP if she goes home or could plan amputation in house if patient wishes.  Will follow  Jaylianna Tatlock L 02/18/2013, 10:37 AM

## 2013-02-18 NOTE — Progress Notes (Signed)
[facility] PROGRESS NOTE  [name] UEA:[id number] DOB: November 10, 1931 DOA: 02/16/2013 PCP: [name], MD  Assessment/Plan: Diabetic foot ulcers  -On foot plain film there was concern for osteomyelitis of the first big toe and couldn't r/o abnormalities of 2nd toe (where patient had open wound)  -Blood cultures and wound cultures have been ordered by the emergency department and so far no growth.  -Will continue vancomycin and Zosyn as dosed per pharmacy for 1 more day -Surgery recs noted. Will likely need 2nd R toe amputated in future. Outpt w/u or surgery in house per Ortho. Per recs, one more days IV antibiotics and then complete a total of 10 days (PO regimen with doxycycline and follow with ortho as outpt)  Diabetes mellitus  -A1c 5.6  -Continue glipizide and sliding scale insulin while in the hospital.  Chronic kidney disease stage IV  -Creatinine at baseline.  -will monitor  Hypothyroidism  -TSH WNL  -Continue home dose of Synthroid.  Hypertension  -stable and well controlled at present  -Continue home medications and adjust as needed.  DVT prophylaxis  -continue Lovenox.   Code Status: Full Family Communication: Pt in room (indicate person spoken with, relationship, and if by phone, the number) Disposition Plan: Pending   Consultants:  Dr. Luiz Blare  Procedures:    Antibiotics:  Vancomycin 02/16/13>>>  Zosyn 02/16/13>>>  HPI/Subjective: Pt without complaints.   Objective: Filed Vitals:   02/17/13 0451 02/17/13 1440 02/17/13 2224 02/18/13 0530  BP: 150/56 148/50 143/63 128/48  Pulse: 63 65 74 68  Temp: 99 F (37.2 C) 98.7 F (37.1 C) 97.3 F (36.3 C) 98.3 F (36.8 C)  TempSrc: Oral Oral Oral Oral  Resp: 18 20 16 18   Height:      Weight:      SpO2: 98% 99% 100% 99%    Intake/Output Summary (Last 24 hours) at 02/18/13 1057 Last data filed at 02/18/13 0539  Gross per 24 hour  Intake    498 ml  Output   1950 ml  Net  -1452 ml   Filed  Weights   02/16/13 1413 02/16/13 1521  Weight: 105.235 kg (232 lb) 103.284 kg (227 lb 11.2 oz)    Exam:   General:  Awake, in nad  Cardiovascular: regular, s1, s2  Respiratory: normal resp effort, no crackles  Abdomen: nondistended, pos bowel sound  Musculoskeletal: perfused, no clubbing, R foot with dressings in place.  Data Reviewed: Basic Metabolic Panel:  Recent Labs Lab 02/16/13 1212 02/16/13 1609 02/17/13 0700  NA 141  --  141  K 4.3  --  4.5  CL 108  --  112  CO2 22  --  22  GLUCOSE 140*  --  104*  BUN 62*  --  57*  CREATININE 2.74* 2.70* 2.69*  CALCIUM 8.6  --  7.9*   Liver Function Tests: No results found for this basename: AST, ALT, ALKPHOS, BILITOT, PROT, ALBUMIN,  in the last 168 hours No results found for this basename: LIPASE, AMYLASE,  in the last 168 hours No results found for this basename: AMMONIA,  in the last 168 hours CBC:  Recent Labs Lab 02/16/13 1212 02/16/13 1609 02/17/13 0700  WBC 7.5 7.4 7.0  NEUTROABS 5.8  --   --   HGB 9.7* 9.2* 8.4*  HCT 30.8* 29.2* 26.8*  MCV 88.0 88.0 88.7  PLT 171 168 156   Cardiac Enzymes: No results found for this basename: CKTOTAL, CKMB, CKMBINDEX, TROPONINI,  in the last 168 hours  BNP (last 3 results) No results found for this basename: PROBNP,  in the last 8760 hours CBG:  Recent Labs Lab 02/17/13 1145 02/17/13 1707 02/17/13 1754 02/17/13 2205 02/18/13 0801  GLUCAP 123* 55* 80 148* 112*    Recent Results (from the past 240 hour(s))  CULTURE, BLOOD (ROUTINE X 2)     Status: None   Collection Time    02/16/13 12:00 PM      Result Value Range Status   Specimen Description BLOOD LEFT ARM   Final   Special Requests BOTTLES DRAWN AEROBIC ONLY 6CC   Final   Culture  Setup Time 02/16/2013 16:38   Final   Culture     Final   Value:        BLOOD CULTURE RECEIVED NO GROWTH TO DATE CULTURE WILL BE HELD FOR 5 DAYS BEFORE ISSUING A FINAL NEGATIVE REPORT   Report Status PENDING   Incomplete   CULTURE, BLOOD (ROUTINE X 2)     Status: None   Collection Time    02/16/13 12:05 PM      Result Value Range Status   Specimen Description BLOOD RIGHT ARM   Final   Special Requests BOTTLES DRAWN AEROBIC ONLY 10CC   Final   Culture  Setup Time 02/16/2013 16:38   Final   Culture     Final   Value:        BLOOD CULTURE RECEIVED NO GROWTH TO DATE CULTURE WILL BE HELD FOR 5 DAYS BEFORE ISSUING A FINAL NEGATIVE REPORT   Report Status PENDING   Incomplete     Studies: Mr Foot Right Wo Contrast  02/17/2013   *RADIOLOGY REPORT*  Clinical Data: Wound of the dorsum of the foot.  MRI OF THE RIGHT FOREFOOT WITHOUT CONTRAST  Technique:  Multiplanar, multisequence MR imaging was performed. No intravenous contrast was administered.  Comparison: Plain films 02/16/2013.  Findings: There is intense subcutaneous edema about the dorsum of the foot.  More focal appearing fluid is seen in the subcutaneous tissues over the dorsum of the third and fourth metatarsals.  This may represent an abscess measuring 2.6 cm transverse by 1 cm cranial-caudal by 5.4 cm long.  Overlying skin appears thickened. There is no bone marrow signal abnormality to suggest osteomyelitis.  No fluid collection within the deep tissues of the foot is identified. Bony irregularity of the medial margin of the base of the fourth metatarsal and lateral margin of the base of the third metatarsal is incompletely visualized and may be secondary to gout.  There is no surrounding soft tissue or marrow edema.  IMPRESSION:  1.  Intense subcutaneous edema over the dorsum of the foot is compatible with cellulitis.  More focal appearing subcutaneous fluid over the dorsum of the third and fourth metatarsals may represent abscess formation. 2.  Negative for osteomyelitis or abscess within the deep soft tissues of the foot.   Original Report Authenticated By: Holley Dexter, M.D.   Mr Foot Left Wo Contrast  02/17/2013   *RADIOLOGY REPORT*  Clinical Data:  Osteomyelitis of the second and third toes.  MRI OF THE LEFT FOREFOOT WITHOUT CONTRAST  Technique:  Multiplanar, multisequence MR imaging was performed. No intravenous contrast was administered.  Comparison: None.  Findings: Diffuse fatty atrophy of the plantar foot musculature is present.  Dorsal subcutaneous edema is present in the lateral aspect of the forefoot.  Apparent ulceration over the dorsal aspect of the third and fourth distal metatarsals.  There is no bone marrow edema,  cortical erosion or evidence of osteomyelitis. Flexor tendons appear intact.  Extensor tendons appear intact. Moderate first MTP joint osteoarthritis.  No fracture.  Visualized midfoot appears within normal limits without erosions.  IMPRESSION: Negative for osteomyelitis or soft tissue abscess.  Dorsal forefoot predominant lateral subcutaneous edema most compatible with cellulitis in the appropriate clinical setting.   Original Report Authenticated By: Andreas Newport, M.D.   Dg Foot Complete Left  02/16/2013   *RADIOLOGY REPORT*  Clinical Data: Wound infection?   Diabetes mellitus.  Chronic renal disease.  LEFT FOOT - COMPLETE 3+ VIEW  Comparison: None.  Findings: Mild soft tissue swelling of the first through third toes.  No visible osseous destructive lesion.  Skeletal osteopenia. Previous bimalleolar fracture repair, incompletely evaluated.  IMPRESSION: Soft tissue swelling.  No plain films signs of osteomyelitis are evident.   Original Report Authenticated By: Davonna Belling, M.D.   Dg Foot Complete Right  02/16/2013   *RADIOLOGY REPORT*  Clinical Data: Wound infection - first through third toes with pain and swelling.  RIGHT FOOT COMPLETE - 3+ VIEW  Comparison: None  Findings: Diffuse osteopenia/osteoporosis noted. There is no evidence of acute fracture, subluxation or dislocation.  On the lateral view, a probable area of osteolysis is noted in the region of the great toe distal phalanx and osteomyelitis is not excluded. Diffuse  soft tissue swelling is present. The Lisfranc joints are intact. A small calcaneal spur and vascular calcifications are noted.  IMPRESSION: Possible osteolysis/osteomyelitis seen on the lateral view in the region of the great toe distal phalanx.  Consider MRI for further evaluation as indicated.  Soft tissue swelling.  Diffuse osteopenia/osteoporosis.   Original Report Authenticated By: Harmon Pier, M.D.    Scheduled Meds: . amLODipine  10 mg Oral Daily  . enoxaparin (LOVENOX) injection  30 mg Subcutaneous Q24H  . glipiZIDE  2.5 mg Oral Q breakfast  . insulin aspart  0-15 Units Subcutaneous TID WC  . insulin aspart  4 Units Subcutaneous TID WC  . levothyroxine  100 mcg Oral QAC breakfast  . omega-3 acid ethyl esters  1 g Oral BID  . piperacillin-tazobactam (ZOSYN)  IV  3.375 g Intravenous Q8H  . simvastatin  5 mg Oral q1800  . sodium chloride  3 mL Intravenous Q12H  . torsemide  20 mg Oral Daily  . vancomycin  1,500 mg Intravenous Q48H   Continuous Infusions:   Principal Problem:   Diabetic foot ulcers Active Problems:   DIABETES MELLITUS   Chronic kidney disease, stage 4, severely decreased GFR   Hypertension   Hypothyroid    Time spent:    Jeanene Mena K  [facility] Pager [phone]. If 7PM-7AM, please contact night-coverage at [email or web address], password [facility] 02/18/2013, 10:57 AM  LOS: 2 days

## 2013-02-18 NOTE — Consult Note (Addendum)
WOC consult requested but ortho team was in this morning to remove dressings, assess wounds, and re-apply kerlex and ace wrap to BLE, pt states.  She relates that this team ordered compression stockings and plans to follow her as outpatient.  Please refer to Dr Luiz Blare for preferred topical treatment and further plan of care. Please re-consult if further assistance is needed.  Thank-you,  Cammie Mcgee MSN, RN, CWOCN, [place], CNS [phone]

## 2013-02-18 NOTE — Progress Notes (Signed)
Inpatient Diabetes Program Recommendations  AACE/ADA: New Consensus Statement on Inpatient Glycemic Control (2013)  Target Ranges:  Prepandial:   less than 140 mg/dL      Peak postprandial:   less than 180 mg/dL (1-2 hours)      Critically ill patients:  140 - 180 mg/dL   Hypoglycemia following breakfast this am.  Inpatient Diabetes Program Recommendations Insulin - Meal Coverage: Decrease to 3 units tidwc HgbA1C: Please discontinue Glipizide while in the hospital.  With chronic kidney disease, half-life of Glipizide can be prolonged.Po intake may be variable. Meal coverage is ordered.  Thank you, Lenor Coffin, RN, CNS, Diabetes Coordinator [phone])

## 2013-02-19 LAB — GLUCOSE, CAPILLARY: Glucose-Capillary: 92 mg/dL (ref 70–99)

## 2013-02-19 MED ORDER — DOXYCYCLINE HYCLATE 50 MG PO CAPS: 100.0000 mg | ORAL_CAPSULE | Freq: Two times a day (BID) | ORAL | Status: DC

## 2013-02-19 NOTE — Progress Notes (Signed)
ANTIBIOTIC CONSULT NOTE - Follow-up  Pharmacy Consult for Vancomycin/Zosyn Indication: Chronic right foot infection  Allergies  Allergen Reactions  . Iodinated Diagnostic Agents Other (See Comments)    REACTION:  unknown    Patient Measurements: Height: 5\' 6"  (167.6 cm) Weight: 227 lb 11.2 oz (103.284 kg) IBW/kg (Calculated) : 59.3 Ht: 5 ft 6 in Wt: 105 kg  Vital Signs: Temp: 98.7 F (37.1 C) (06/12 0508) Temp src: Oral (06/12 0508) BP: 133/45 mmHg (06/12 0508) Pulse Rate: 69 (06/12 0508) Intake/Output from previous day: 06/11 0701 - 06/12 0700 In: 560 [P.O.:240; I.V.:160; IV Piggyback:160] Out: 850 [Urine:850] Intake/Output from this shift:    Labs:  Recent Labs  02/16/13 1212 02/16/13 1609 02/17/13 0700  WBC 7.5 7.4 7.0  HGB 9.7* 9.2* 8.4*  PLT 171 168 156  CREATININE 2.74* 2.70* 2.69*   Estimated Creatinine Clearance: 20.6 ml/min (by C-G formula based on Cr of 2.69). No results found for this basename: VANCOTROUGH, VANCOPEAK, VANCORANDOM, GENTTROUGH, GENTPEAK, GENTRANDOM, TOBRATROUGH, TOBRAPEAK, TOBRARND, AMIKACINPEAK, AMIKACINTROU, AMIKACIN,  in the last 72 hours   Microbiology: Recent Results (from the past 720 hour(s))  CULTURE, BLOOD (ROUTINE X 2)     Status: None   Collection Time    02/16/13 12:00 PM      Result Value Range Status   Specimen Description BLOOD LEFT ARM   Final   Special Requests BOTTLES DRAWN AEROBIC ONLY 6CC   Final   Culture  Setup Time 02/16/2013 16:38   Final   Culture     Final   Value:        BLOOD CULTURE RECEIVED NO GROWTH TO DATE CULTURE WILL BE HELD FOR 5 DAYS BEFORE ISSUING A FINAL NEGATIVE REPORT   Report Status PENDING   Incomplete  CULTURE, BLOOD (ROUTINE X 2)     Status: None   Collection Time    02/16/13 12:05 PM      Result Value Range Status   Specimen Description BLOOD RIGHT ARM   Final   Special Requests BOTTLES DRAWN AEROBIC ONLY 10CC   Final   Culture  Setup Time 02/16/2013 16:38   Final   Culture      Final   Value:        BLOOD CULTURE RECEIVED NO GROWTH TO DATE CULTURE WILL BE HELD FOR 5 DAYS BEFORE ISSUING A FINAL NEGATIVE REPORT   Report Status PENDING   Incomplete    Medical History: Past Medical History  Diagnosis Date  . CHF (congestive heart failure)      preserved left ventricular systolic function  . Hypertension   . Overweight(278.02)   . Hypothyroid   . Vaginal pessary present ~ 2012  . CKD (chronic kidney disease) stage 4, GFR 15-29 ml/min     [name] 02/16/2013  . Type II diabetes mellitus   . Anemia associated with chronic renal failure     ESA therapy  . History of blood transfusion 1975  . Rheumatoid arthritis   . Osteoporosis   . Pneumonia 02/2011    [name] 02/20/2011 (02/16/2013)  . Diabetic foot ulcers     "get them on borh feet" (02/16/2013)    Assessment: 79 y/o F presents with bilateral LE infections. Over the last several days her wounds have been oozing purulent fluid. Plan is for eventual R toe amputation per ortho with one more day IV abx, then total of 10 days completed with PO doxycycline and outpatient ortho f/u.  Last labs 6/10 revealed WBC wnl, CKD with  Scr 2.74 (appears to be baseline), CrCl ~55ml/min. Currently afebrile with OK UOP.   Goal of Therapy:  Vancomycin trough level 15-20 mcg/ml  Plan:  - Continue vancomycin 1500 mg IV q48h - Continue Zosyn 3.375G IV q8h to be infused over 4 hours - Follow up SCr, UOP, cultures, clinical course and adjust as clinically indicated - Vanc trough if tx continues >7days  Haisley Arens L. Illene Bolus, PharmD, BCPS Clinical Pharmacist Pager: [phone] Pharmacy: [phone] 02/19/2013 8:17 AM

## 2013-02-19 NOTE — Discharge Summary (Signed)
Physician Discharge Summary  [name] YNW:[id number] DOB: 1932-07-01 DOA: 02/16/2013  PCP: [name], MD  Admit date: 02/16/2013 Discharge date: 02/19/2013  Time spent: 30 minutes  Recommendations for Outpatient Follow-up:  1. Follow up with Dr. Luiz Blare in one week 2. Take 10 days of oral antibiotics (doxycycline)  Discharge Diagnoses:  Principal Problem:   Diabetic foot ulcers Active Problems:   DIABETES MELLITUS   Chronic kidney disease, stage 4, severely decreased GFR   Hypertension   Hypothyroid   Discharge Condition: Stable  Diet recommendation: Diabetic  Filed Weights   02/16/13 1413 02/16/13 1521  Weight: 105.235 kg (232 lb) 103.284 kg (227 lb 11.2 oz)    History of present illness:  Patient is a 79 year old African American obese woman with past medical history significant for diabetes, stage IV chronic kidney disease and chronic diabetic foot ulcers. Per daughter went to see her yesterday and advised that she seek medical treatment for her foot ulcers. Patient states she recently got new shoes and thinks that they have been rubbing the tops of her toes. She denies fever, chills, pain at the site of her ulcers. She had been active before with the wound clinic but has not seen them in over a year. We have been asked to see her for further evaluation and management.  Hospital Course:  The patient was admitted to the inpatient service. Orthopedic Surgery was consulted with initial plans for LE amputation. However, on review of LE MRI, the patient was found to have findings of bilateral foot swelling without abscess or osteomyelitis. Per Orthopedic Surgery, the patient will ultimately likely need her R 2nd to amputated in the future, however, per recommendations, the patient was continued with 2 additional days of broad spectrum IV antibiotics and will be transitioned to 10 days of oral doxycycline. She will follow up with Orthopedic surgery in one  week.   Consultations:  Orthopedic Surgery - Dr. Luiz Blare  Discharge Exam: Filed Vitals:   02/18/13 0530 02/18/13 1351 02/18/13 2146 02/19/13 0508  BP: 128/48 151/77 129/43 133/45  Pulse: 68 68 58 69  Temp: 98.3 F (36.8 C) 97.5 F (36.4 C) 98.6 F (37 C) 98.7 F (37.1 C)  TempSrc: Oral Oral Oral Oral  Resp: 18 20 16 17   Height:      Weight:      SpO2: 99% 97% 100% 98%    General: Awake, in NAD Cardiovascular: Regular, s1, s2 Respiratory: Normal respiratory effort, no crackles or wheezing  Discharge Instructions       Future Appointments Provider Department Dept Phone   03/16/2013 1:00 PM Jodelle Gross, NP [facility] [phone]       Medication List    TAKE these medications       ALPRAZolam 0.5 MG tablet  Commonly known as:  XANAX  Take 0.5 mg by mouth every 6 (six) hours as needed for anxiety.     amLODipine 10 MG tablet  Commonly known as:  NORVASC  Take 10 mg by mouth daily.     doxycycline 50 MG capsule  Commonly known as:  VIBRAMYCIN  Take 2 capsules (100 mg total) by mouth 2 (two) times daily.     fish oil-omega-3 fatty acids 1000 MG capsule  Take 1 capsule by mouth daily.     glipiZIDE 2.5 MG 24 hr tablet  Commonly known as:  GLUCOTROL XL  Take 2.5 mg by mouth daily.     levothyroxine 100 MCG tablet  Commonly known  as:  SYNTHROID, LEVOTHROID  Take 100 mcg by mouth daily.     pravastatin 40 MG tablet  Commonly known as:  PRAVACHOL  Take 20 mg by mouth daily.     torsemide 20 MG tablet  Commonly known as:  DEMADEX  Take 20 mg by mouth daily.       Allergies  Allergen Reactions  . Iodinated Diagnostic Agents Other (See Comments)    REACTION:  unknown   Follow-up Information   Schedule an appointment as soon as possible for a visit with [name], MD.   Contact information:   [address] [place] [address] [phone]       Follow up with GRAVES,JOHN L, MD In 1  week.   Contact information:   [address] [place] [address] [phone]        The results of significant diagnostics from this hospitalization (including imaging, microbiology, ancillary and laboratory) are listed below for reference.    Significant Diagnostic Studies: Mr Foot Right Wo Contrast  02/17/2013   *RADIOLOGY REPORT*  Clinical Data: Wound of the dorsum of the foot.  MRI OF THE RIGHT FOREFOOT WITHOUT CONTRAST  Technique:  Multiplanar, multisequence MR imaging was performed. No intravenous contrast was administered.  Comparison: Plain films 02/16/2013.  Findings: There is intense subcutaneous edema about the dorsum of the foot.  More focal appearing fluid is seen in the subcutaneous tissues over the dorsum of the third and fourth metatarsals.  This may represent an abscess measuring 2.6 cm transverse by 1 cm cranial-caudal by 5.4 cm long.  Overlying skin appears thickened. There is no bone marrow signal abnormality to suggest osteomyelitis.  No fluid collection within the deep tissues of the foot is identified. Bony irregularity of the medial margin of the base of the fourth metatarsal and lateral margin of the base of the third metatarsal is incompletely visualized and may be secondary to gout.  There is no surrounding soft tissue or marrow edema.  IMPRESSION:  1.  Intense subcutaneous edema over the dorsum of the foot is compatible with cellulitis.  More focal appearing subcutaneous fluid over the dorsum of the third and fourth metatarsals may represent abscess formation. 2.  Negative for osteomyelitis or abscess within the deep soft tissues of the foot.   Original Report Authenticated By: Holley Dexter, M.D.   Mr Foot Left Wo Contrast  02/17/2013   *RADIOLOGY REPORT*  Clinical Data: Osteomyelitis of the second and third toes.  MRI OF THE LEFT FOREFOOT WITHOUT CONTRAST  Technique:  Multiplanar, multisequence MR imaging was performed. No intravenous contrast was administered.   Comparison: None.  Findings: Diffuse fatty atrophy of the plantar foot musculature is present.  Dorsal subcutaneous edema is present in the lateral aspect of the forefoot.  Apparent ulceration over the dorsal aspect of the third and fourth distal metatarsals.  There is no bone marrow edema, cortical erosion or evidence of osteomyelitis. Flexor tendons appear intact.  Extensor tendons appear intact. Moderate first MTP joint osteoarthritis.  No fracture.  Visualized midfoot appears within normal limits without erosions.  IMPRESSION: Negative for osteomyelitis or soft tissue abscess.  Dorsal forefoot predominant lateral subcutaneous edema most compatible with cellulitis in the appropriate clinical setting.   Original Report Authenticated By: Andreas Newport, M.D.   Dg Foot Complete Left  02/16/2013   *RADIOLOGY REPORT*  Clinical Data: Wound infection?   Diabetes mellitus.  Chronic renal disease.  LEFT FOOT - COMPLETE 3+ VIEW  Comparison: None.  Findings: Mild soft tissue swelling of the first through third toes.  No visible osseous destructive lesion.  Skeletal osteopenia. Previous bimalleolar fracture repair, incompletely evaluated.  IMPRESSION: Soft tissue swelling.  No plain films signs of osteomyelitis are evident.   Original Report Authenticated By: Davonna Belling, M.D.   Dg Foot Complete Right  02/16/2013   *RADIOLOGY REPORT*  Clinical Data: Wound infection - first through third toes with pain and swelling.  RIGHT FOOT COMPLETE - 3+ VIEW  Comparison: None  Findings: Diffuse osteopenia/osteoporosis noted. There is no evidence of acute fracture, subluxation or dislocation.  On the lateral view, a probable area of osteolysis is noted in the region of the great toe distal phalanx and osteomyelitis is not excluded. Diffuse soft tissue swelling is present. The Lisfranc joints are intact. A small calcaneal spur and vascular calcifications are noted.  IMPRESSION: Possible osteolysis/osteomyelitis seen on the lateral view  in the region of the great toe distal phalanx.  Consider MRI for further evaluation as indicated.  Soft tissue swelling.  Diffuse osteopenia/osteoporosis.   Original Report Authenticated By: Harmon Pier, M.D.    Microbiology: Recent Results (from the past 240 hour(s))  CULTURE, BLOOD (ROUTINE X 2)     Status: None   Collection Time    02/16/13 12:00 PM      Result Value Range Status   Specimen Description BLOOD LEFT ARM   Final   Special Requests BOTTLES DRAWN AEROBIC ONLY 6CC   Final   Culture  Setup Time 02/16/2013 16:38   Final   Culture     Final   Value:        BLOOD CULTURE RECEIVED NO GROWTH TO DATE CULTURE WILL BE HELD FOR 5 DAYS BEFORE ISSUING A FINAL NEGATIVE REPORT   Report Status PENDING   Incomplete  CULTURE, BLOOD (ROUTINE X 2)     Status: None   Collection Time    02/16/13 12:05 PM      Result Value Range Status   Specimen Description BLOOD RIGHT ARM   Final   Special Requests BOTTLES DRAWN AEROBIC ONLY 10CC   Final   Culture  Setup Time 02/16/2013 16:38   Final   Culture     Final   Value:        BLOOD CULTURE RECEIVED NO GROWTH TO DATE CULTURE WILL BE HELD FOR 5 DAYS BEFORE ISSUING A FINAL NEGATIVE REPORT   Report Status PENDING   Incomplete     Labs: Basic Metabolic Panel:  Recent Labs Lab 02/16/13 1212 02/16/13 1609 02/17/13 0700  NA 141  --  141  K 4.3  --  4.5  CL 108  --  112  CO2 22  --  22  GLUCOSE 140*  --  104*  BUN 62*  --  57*  CREATININE 2.74* 2.70* 2.69*  CALCIUM 8.6  --  7.9*   Liver Function Tests: No results found for this basename: AST, ALT, ALKPHOS, BILITOT, PROT, ALBUMIN,  in the last 168 hours No results found for this basename: LIPASE, AMYLASE,  in the last 168 hours No results found for this basename: AMMONIA,  in the last 168 hours CBC:  Recent Labs Lab 02/16/13 1212 02/16/13 1609 02/17/13 0700  WBC 7.5 7.4 7.0  NEUTROABS 5.8  --   --   HGB 9.7* 9.2* 8.4*  HCT 30.8* 29.2* 26.8*  MCV 88.0 88.0 88.7  PLT 171 168 156    Cardiac Enzymes: No results found for this  basename: CKTOTAL, CKMB, CKMBINDEX, TROPONINI,  in the last 168 hours BNP: BNP (last 3 results) No results found for this basename: PROBNP,  in the last 8760 hours CBG:  Recent Labs Lab 02/18/13 0801 02/18/13 1225 02/18/13 1729 02/18/13 2224 02/19/13 0809  GLUCAP 112* 118* 94 76 92       Signed:  CHIU, STEPHEN K  [facility] 02/19/2013, 11:22 AM

## 2013-02-22 LAB — CULTURE, BLOOD (ROUTINE X 2)

## 2013-02-23 ENCOUNTER — Telehealth: Payer: Self-pay | Admitting: *Deleted

## 2013-02-23 DIAGNOSIS — D649 Anemia, unspecified: Secondary | ICD-10-CM

## 2013-02-23 DIAGNOSIS — I10 Essential (primary) hypertension: Secondary | ICD-10-CM

## 2013-02-23 NOTE — Telephone Encounter (Signed)
Pt has labs done when she was in hospital, does she need to have orders done again?

## 2013-02-24 NOTE — Telephone Encounter (Signed)
Called pt to advise she will need to have the CMET and CBC drawn within the next 2 weeks per noted pt was to have performed on 02-06-13, noted hospital labs did not include CMET, pt will go to [facility] to have drawn with in 2 weeks, advised we will call her with the results, orders released, pt understood

## 2013-02-25 LAB — COMPREHENSIVE METABOLIC PANEL
BUN: 59 mg/dL — ABNORMAL HIGH (ref 6–23)
CO2: 21 mEq/L (ref 19–32)
Calcium: 8.8 mg/dL (ref 8.4–10.5)
Chloride: 106 mEq/L (ref 96–112)
Creat: 2.44 mg/dL — ABNORMAL HIGH (ref 0.50–1.10)
Glucose, Bld: 119 mg/dL — ABNORMAL HIGH (ref 70–99)
Total Bilirubin: 0.4 mg/dL (ref 0.3–1.2)

## 2013-02-25 LAB — CBC
HCT: 29.8 % — ABNORMAL LOW (ref 36.0–46.0)
Hemoglobin: 9.4 g/dL — ABNORMAL LOW (ref 12.0–15.0)
MCH: 26.7 pg (ref 26.0–34.0)
MCV: 84.7 fL (ref 78.0–100.0)
RBC: 3.52 MIL/uL — ABNORMAL LOW (ref 3.87–5.11)

## 2013-02-26 ENCOUNTER — Encounter: Payer: Self-pay | Admitting: Cardiology

## 2013-02-26 ENCOUNTER — Encounter: Payer: Self-pay | Admitting: *Deleted

## 2013-03-16 ENCOUNTER — Ambulatory Visit: Payer: PRIVATE HEALTH INSURANCE | Admitting: Adult Health

## 2013-03-18 ENCOUNTER — Other Ambulatory Visit: Payer: Self-pay | Admitting: Obstetrics & Gynecology

## 2013-03-18 ENCOUNTER — Ambulatory Visit: Payer: Self-pay | Admitting: Adult Health

## 2013-03-26 ENCOUNTER — Encounter: Payer: Self-pay | Admitting: Obstetrics & Gynecology

## 2013-03-26 ENCOUNTER — Ambulatory Visit (INDEPENDENT_AMBULATORY_CARE_PROVIDER_SITE_OTHER): Payer: PRIVATE HEALTH INSURANCE | Admitting: Obstetrics & Gynecology

## 2013-03-26 VITALS — BP 150/64 | Ht 66.0 in | Wt 237.0 lb

## 2013-03-26 DIAGNOSIS — Z1212 Encounter for screening for malignant neoplasm of rectum: Secondary | ICD-10-CM

## 2013-03-26 DIAGNOSIS — N813 Complete uterovaginal prolapse: Secondary | ICD-10-CM | POA: Insufficient documentation

## 2013-03-26 DIAGNOSIS — Z01419 Encounter for gynecological examination (general) (routine) without abnormal findings: Secondary | ICD-10-CM

## 2013-03-26 NOTE — Progress Notes (Signed)
Patient ID: [name], female   DOB: 11/28/1931, 79 y.o.   MRN: [id number] Subjective:     [name] is a 79 y.o. female here for a routine exam.  No LMP recorded. Patient is postmenopausal. No obstetric history on file. Current complaints: none, no bleeding or discharge from the Gelhorn pessary.  Personal health questionnaire reviewed: yes.   Gynecologic History No LMP recorded. Patient is postmenopausal. Contraception: post menopausal status Last Pap: 2011. Results were: normal Last mammogram: 2014. Results were: normal  Obstetric History OB History   Grav Para Term Preterm Abortions TAB SAB Ect Mult Living                   The following portions of the patient's history were reviewed and updated as appropriate: allergies, current medications, past family history, past medical history, past social history, past surgical history and problem list.  Review of Systems  Review of Systems  Constitutional: Negative for fever, chills, weight loss, malaise/fatigue and diaphoresis.  HENT: Negative for hearing loss, ear pain, nosebleeds, congestion, sore throat, neck pain, tinnitus and ear discharge.   Eyes: Negative for blurred vision, double vision, photophobia, pain, discharge and redness.  Respiratory: Negative for cough, hemoptysis, sputum production, shortness of breath, wheezing and stridor.   Cardiovascular: Negative for chest pain, palpitations, orthopnea, claudication, leg swelling and PND.  Gastrointestinal: negative for abdominal pain. Negative for heartburn, nausea, vomiting, diarrhea, constipation, blood in stool and melena.  Genitourinary: Negative for dysuria, urgency, frequency, hematuria and flank pain.  Musculoskeletal: Negative for myalgias, back pain, joint pain and falls.  Skin: Negative for itching and rash.  Neurological: Negative for dizziness, tingling, tremors, sensory change, speech change, focal weakness, seizures, loss of consciousness, weakness and  headaches.  Endo/Heme/Allergies: Negative for environmental allergies and polydipsia. Does not bruise/bleed easily.  Psychiatric/Behavioral: Negative for depression, suicidal ideas, hallucinations, memory loss and substance abuse. The patient is not nervous/anxious and does not have insomnia.        Objective:    Physical Exam  Vitals reviewed. Constitutional: She is oriented to person, place, and time. She appears well-developed and well-nourished.  HENT:  Head: Normocephalic and atraumatic.        Right Ear: External ear normal.  Left Ear: External ear normal.  Nose: Nose normal.  Mouth/Throat: Oropharynx is clear and moist.  Eyes: Conjunctivae and EOM are normal. Pupils are equal, round, and reactive to light. Right eye exhibits no discharge. Left eye exhibits no discharge. No scleral icterus.  Neck: Normal range of motion. Neck supple. No tracheal deviation present. No thyromegaly present.  Cardiovascular: Normal rate, regular rhythm, normal heart sounds and intact distal pulses.  Exam reveals no gallop and no friction rub.   No murmur heard. Respiratory: Effort normal and breath sounds normal. No respiratory distress. She has no wheezes. She has no rales. She exhibits no tenderness.  GI: Soft. Bowel sounds are normal. She exhibits no distension and no mass. There is no tenderness. There is no rebound and no guarding.  Genitourinary:       Vulva is normal without lesions Vagina is pink moist without discharge, Gelhorn pessary is in place Cervix  Uterus is pessary Adnexa is pessary  Musculoskeletal: Normal range of motion. She exhibits no edema and no tenderness.  Neurological: She is alert and oriented to person, place, and time. She has normal reflexes. She displays normal reflexes. No cranial nerve deficit. She exhibits normal muscle tone. Coordination normal.  Skin: Skin is  warm and dry. No rash noted. No erythema. No pallor.  Psychiatric: She has a normal mood and affect. Her  behavior is normal. Judgment and thought content normal.       Assessment:    Healthy female exam.    Plan:    Follow up in: 6 months.to check pessary

## 2013-03-26 NOTE — Patient Instructions (Addendum)
Prolapse  Prolapse means the falling down, bulging, dropping, or drooping of a body part. Organs that commonly prolapse include the rectum, small intestine, bladder, urethra, vagina (birth canal), uterus (womb), and cervix. Prolapse occurs when the ligaments and muscle tissue around the rectum, bladder, and uterus are damaged or weakened.  CAUSES  This happens especially with:  Childbirth. Some women feel pelvic pressure or have trouble holding their urine right after childbirth, because of stretching and tearing of pelvic tissues. This generally gets better with time and the feeling usually goes away, but it may return with aging.  Chronic heavy lifting.  Aging.  Menopause, with loss of estrogen production weakening the pelvic ligaments and muscles.  Past pelvic surgery.  Obesity.  Chronic constipation.  Chronic cough. Prolapse may affect a single organ, or several organs may prolapse at the same time. The front wall of the vagina holds up the bladder. The back wall holds up part of the lower intestine, or rectum. The uterus fills a spot in the middle. All these organs can be involved when the ligaments and muscles around the vagina relax too much. This often gets worse when women stop producing estrogen (menopause). SYMPTOMS  Uncontrolled loss of urine (incontinence) with cough, sneeze, straining, and exercise.  More force may be required to have a bowel movement, due to trapping of the stool.  When part of an organ bulges through the opening of the vagina, there is sometimes a feeling of heaviness or pressure. It may feel as though something is falling out. This sensation increases with coughing or bearing down.  If the organs protrude through the opening of the vagina and rub against the clothing, there may be soreness, ulcers, infection, pain, and bleeding.  Lower back pain.  Pushing in the upper or lower part of the vagina, to pass urine or have a bowel movement.  Problems  having sexual intercourse.  Being unable to insert a tampon or applicator. DIAGNOSIS  Usually, a physical exam is all that is needed to identify the problem. During the examination, you may be asked to cough and strain while lying down, sitting up, and standing up. Your caregiver will determine if more testing is required, such as bladder function tests. Some diagnoses are:  Cystocele: Bulging and falling of the bladder into the top of the vagina.  Rectocele: Part of the rectum bulging into the vagina.  Prolapse of the uterus: The uterus falls or drops into the vagina.  Enterocele: Bulging of the top of the vagina, after a hysterectomy (uterus removal), with the small intestine bulging into the vagina. A hernia in the top of the vagina.  Urethrocele: The urethra (urine carrying tube) bulging into the vagina. TREATMENT  In most cases, prolapse needs to be treated only if it produces symptoms. If the symptoms are interfering with your usual daily or sexual activities, treatment may be necessary. The following are some measures that may be used to treat prolapse.  Estrogen may help elderly women with mild prolapse.  Kegel exercises may help mild cases of prolapse, by strengthening and tightening the muscles of the pelvic floor.  Pessaries are used in women who choose not to, or are unable to, have surgery. A pessary is a doughnut-shaped piece of plastic or rubber that is put into the vagina to keep the organs in place. This device must be fitted by your caregiver. Your caregiver will also explain how to care for yourself with the pessary. If it works well for you,   this may be the only treatment required.  Surgery is often the only form of treatment for more severe prolapses. There are different types of surgery available. You should discuss what the best procedure is for you. If the uterus is prolapsed, it may be removed (hysterectomy) as part of the surgical treatment. Your caregiver will  discuss the risks and benefits with you.  Uterine-vaginal suspension (surgery to hold up the organs) may be used, especially if you want to maintain your fertility. No form of treatment is guaranteed to correct the prolapse or relieve the symptoms. HOME CARE INSTRUCTIONS   Wear a sanitary pad or absorbent product if you have incontinence of urine.  Avoid heavy lifting and straining with exercise and work.  Take over-the-counter pain medicine for minor discomfort.  Try taking estrogen or using estrogen vaginal cream.  Try Kegel exercises or use a pessary, before deciding to have surgery.  Do Kegel exercises after having a baby. SEEK MEDICAL CARE IF:   Your symptoms interfere with your daily activities.  You need medicine to help with the discomfort.  You need to be fitted with a pessary.  You notice bleeding from the vagina.  You think you have ulcers or you notice ulcers on the cervix.  You have an oral temperature above 102 F (38.9 C).  You develop pain or blood with urination.  You have bleeding with a bowel movement.  The symptoms are interfering with your sex life.  You have urinary incontinence that interferes with your daily activities.  You lose urine with sexual intercourse.  You have a chronic cough.  You have chronic constipation. Document Released: 03/03/2003 Document Revised: 11/19/2011 Document Reviewed: 09/11/2009 [employer] Patient Information 2014 [employer].  

## 2013-04-01 ENCOUNTER — Encounter (HOSPITAL_COMMUNITY)
Admission: RE | Admit: 2013-04-01 | Discharge: 2013-04-01 | Disposition: A | Discharge status: Home / Self Care | Payer: PRIVATE HEALTH INSURANCE | Source: Ambulatory Visit | Attending: Nephrology | Admitting: Nephrology

## 2013-04-01 DIAGNOSIS — D638 Anemia in other chronic diseases classified elsewhere: Secondary | ICD-10-CM | POA: Insufficient documentation

## 2013-04-01 DIAGNOSIS — N183 Chronic kidney disease, stage 3 unspecified: Secondary | ICD-10-CM | POA: Insufficient documentation

## 2013-04-01 LAB — RENAL FUNCTION PANEL
Albumin: 3.2 g/dL — ABNORMAL LOW (ref 3.5–5.2)
BUN: 66 mg/dL — ABNORMAL HIGH (ref 6–23)
Chloride: 107 mEq/L (ref 96–112)
Creatinine, Ser: 2.72 mg/dL — ABNORMAL HIGH (ref 0.50–1.10)
Phosphorus: 4.1 mg/dL (ref 2.3–4.6)

## 2013-04-01 LAB — IRON AND TIBC
TIBC: 215 ug/dL — ABNORMAL LOW (ref 250–470)
UIBC: 172 ug/dL (ref 125–400)

## 2013-04-01 LAB — POCT HEMOGLOBIN-HEMACUE: Hemoglobin: 8.4 g/dL — ABNORMAL LOW (ref 12.0–15.0)

## 2013-04-01 LAB — FERRITIN: Ferritin: 811 ng/mL — ABNORMAL HIGH (ref 10–291)

## 2013-04-01 MED ORDER — EPOETIN ALFA 20000 UNIT/ML IJ SOLN
20000.0000 [IU] | INTRAMUSCULAR | Status: DC
  Administered 2013-04-01: 20000 [IU] via SUBCUTANEOUS

## 2013-04-01 MED ORDER — EPOETIN ALFA 20000 UNIT/ML IJ SOLN
INTRAMUSCULAR | Status: AC
  Filled 2013-04-01: qty 1

## 2013-04-07 ENCOUNTER — Ambulatory Visit (INDEPENDENT_AMBULATORY_CARE_PROVIDER_SITE_OTHER): Payer: PRIVATE HEALTH INSURANCE | Admitting: Adult Health

## 2013-04-07 ENCOUNTER — Encounter: Payer: Self-pay | Admitting: Adult Health

## 2013-04-07 VITALS — BP 138/70 | HR 74 | Ht 66.5 in | Wt 237.0 lb

## 2013-04-07 DIAGNOSIS — Z136 Encounter for screening for cardiovascular disorders: Secondary | ICD-10-CM

## 2013-04-07 DIAGNOSIS — I10 Essential (primary) hypertension: Secondary | ICD-10-CM

## 2013-04-07 DIAGNOSIS — R0989 Other specified symptoms and signs involving the circulatory and respiratory systems: Secondary | ICD-10-CM

## 2013-04-07 DIAGNOSIS — E785 Hyperlipidemia, unspecified: Secondary | ICD-10-CM

## 2013-04-07 DIAGNOSIS — I051 Rheumatic mitral insufficiency: Secondary | ICD-10-CM

## 2013-04-07 NOTE — Progress Notes (Signed)
HPI: [name] is a 79 year old female patient of Dr. Dietrich Pates we are following for ongoing assessment and management of hypertension, cerebrovascular disease, but no known history of CAD. She also has history of diabetes and moderate chronic kidney disease. He was last seen by Dr. Dietrich Pates in September 2013 post surgical correction of her prolapse uterus. She was found to have carotid bruit, most recent carotid ultrasound sound completed in 2013. Repeat carotid ultrasound was completed prior to this office visit. She was also found to be hypertensive, and advised take blood pressures at home and bring recordings on next office visit.   Ultrasound completed demonstrated Minimal amounts of bilateral intimal wall thickening/atherosclerotic plaque does not result in hemodynamically significant stenosis.   She comes today without complaint. She did not have surgery for uterine prolapse. She continues to be treated medically. She admits to overeating over the last couple days at a family reunion . She did have some weight gain and fluid retention for which he took an extra dose of Lasix per home health nurse evaluation. Otherwise she has been doing very well.         Allergies  Allergen Reactions  . Iodinated Diagnostic Agents Other (See Comments)    REACTION:  unknown    Current Outpatient Prescriptions  Medication Sig Dispense Refill  . ALPRAZolam (XANAX) 0.5 MG tablet Take 0.5 mg by mouth every 6 (six) hours as needed for anxiety.       Marland Kitchen amLODipine (NORVASC) 10 MG tablet Take 10 mg by mouth daily.        Marland Kitchen doxycycline (VIBRAMYCIN) 50 MG capsule Take 2 capsules (100 mg total) by mouth 2 (two) times daily.  40 capsule  0  . fish oil-omega-3 fatty acids 1000 MG capsule Take 1 capsule by mouth daily.        Marland Kitchen glipiZIDE (GLUCOTROL XL) 2.5 MG 24 hr tablet Take 2.5 mg by mouth daily.        Marland Kitchen levothyroxine (SYNTHROID, LEVOTHROID) 100 MCG tablet Take 100 mcg by mouth daily.        . pravastatin  (PRAVACHOL) 40 MG tablet Take 20 mg by mouth daily.      Marland Kitchen torsemide (DEMADEX) 20 MG tablet Take 20 mg by mouth daily.         No current facility-administered medications for this visit.    Past Medical History  Diagnosis Date  . CHF (congestive heart failure)      preserved left ventricular systolic function  . Hypertension   . Overweight(278.02)   . Hypothyroid   . Vaginal pessary present ~ 2012  . CKD (chronic kidney disease) stage 4, GFR 15-29 ml/min     [name] 02/16/2013  . Type II diabetes mellitus   . Anemia associated with chronic renal failure     ESA therapy  . History of blood transfusion 1975  . Rheumatoid arthritis   . Osteoporosis   . Pneumonia 02/2011    [name] 02/20/2011 (02/16/2013)  . Diabetic foot ulcers     "get them on borh feet" (02/16/2013)    Past Surgical History  Procedure Laterality Date  . Cataract extraction w/ intraocular lens  implant, bilateral Bilateral 1990's  . Orif femur fracture Left 1975    "got a rod and screw in it" (02/16/2013)    AVW:[id number] of systems complete and found to be negative unless listed above  PHYSICAL EXAM BP 138/70  Pulse 74  Ht 5' 6.5" (1.689 m)  Wt 237 lb (107.502  kg)  BMI 37.68 kg/m2  General: Well developed, well nourished, in no acute distress Head: Eyes PERRLA, No xanthomas.   Normal cephalic and atramatic  Lungs: Clear bilaterally to auscultation and percussion. Heart: HRRR S1 S2, with 1/6 with pronounced P2 abdominal bruits.                 Hernia's noted. Msk:  Back kyphosis noted, normal gait. Normal strength and tone for age. Extremities: No clubbing, cyanosis or edema.  DP +1 Neuro: Alert and oriented X 3. Psych:  Good affect, responds appropriately  EKG: Normal sinus rhythm nonspecific T wave abnormality noted in the anterior lateral leads rate of 74 beats per minute  ASSESSMENT AND PLAN

## 2013-04-07 NOTE — Assessment & Plan Note (Signed)
She has been followed by Dr. Regino Schultze and is had lab work completed within the last year. We will continue on Pravachol and fish oil

## 2013-04-07 NOTE — Assessment & Plan Note (Signed)
Excellent control of blood pressure at this time. Will not make any medication changes. She will remain on Demadex and amlodipine. I am hearing a prominent murmur at the left sternal border. Review of echocardiogram results demonstrate she has not had one in over 4 years. I will recheck echocardiogram to evaluate for valvular abnormalities.

## 2013-04-07 NOTE — Progress Notes (Deleted)
Name: [name]    DOB: 09-28-1931  Age: 79 y.o.  MR#: [id number]       PCP:  [name], MD      Insurance: Payor: [employer] MEDICARE / Plan: Ollen Gross / Product Type: *No Product type* /   CC:   No chief complaint on file.   VS Filed Vitals:   04/07/13 1419  BP: 138/70  Pulse: 74  Height: 5' 6.5" (1.689 m)  Weight: 237 lb (107.502 kg)    Weights Current Weight  04/07/13 237 lb (107.502 kg)  03/26/13 237 lb (107.502 kg)  02/16/13 227 lb 11.2 oz (103.284 kg)    Blood Pressure  BP Readings from Last 3 Encounters:  04/07/13 138/70  04/01/13 143/80  03/26/13 150/64     Admit date:  (Not on file) Last encounter with RMR:  03/18/2013   Allergy Iodinated diagnostic agents  Current Outpatient Prescriptions  Medication Sig Dispense Refill  . ALPRAZolam (XANAX) 0.5 MG tablet Take 0.5 mg by mouth every 6 (six) hours as needed for anxiety.       Marland Kitchen amLODipine (NORVASC) 10 MG tablet Take 10 mg by mouth daily.        Marland Kitchen doxycycline (VIBRAMYCIN) 50 MG capsule Take 2 capsules (100 mg total) by mouth 2 (two) times daily.  40 capsule  0  . fish oil-omega-3 fatty acids 1000 MG capsule Take 1 capsule by mouth daily.        Marland Kitchen glipiZIDE (GLUCOTROL XL) 2.5 MG 24 hr tablet Take 2.5 mg by mouth daily.        Marland Kitchen levothyroxine (SYNTHROID, LEVOTHROID) 100 MCG tablet Take 100 mcg by mouth daily.        . pravastatin (PRAVACHOL) 40 MG tablet Take 20 mg by mouth daily.      Marland Kitchen torsemide (DEMADEX) 20 MG tablet Take 20 mg by mouth daily.         No current facility-administered medications for this visit.    Discontinued Meds:   There are no discontinued medications.  Patient Active Problem List   Diagnosis Date Noted  . Uterovaginal prolapse, complete 03/26/2013  . Diabetic foot ulcers 02/16/2013  . Hyperkalemia 06/24/2012  . Hyperlipidemia 05/20/2012  . Uterine prolapse 05/19/2012  . Chronic kidney disease, stage 4, severely decreased GFR   . Hypertension   . Anemia  associated with chronic renal failure   . Hypothyroid   . CAROTID BRUIT, LEFT 05/11/2009  . DIABETES MELLITUS 03/11/2009  . OVERWEIGHT/OBESITY 03/11/2009    LABS    Component Value Date/Time   NA 140 04/01/2013 1500   NA 140 02/25/2013 0000   NA 141 02/17/2013 0700   K 4.7 04/01/2013 1500   K 4.1 02/25/2013 0000   K 4.5 02/17/2013 0700   CL 107 04/01/2013 1500   CL 106 02/25/2013 0000   CL 112 02/17/2013 0700   CO2 20 04/01/2013 1500   CO2 21 02/25/2013 0000   CO2 22 02/17/2013 0700   GLUCOSE 115* 04/01/2013 1500   GLUCOSE 119* 02/25/2013 0000   GLUCOSE 104* 02/17/2013 0700   BUN 66* 04/01/2013 1500   BUN 59* 02/25/2013 0000   BUN 57* 02/17/2013 0700   CREATININE 2.72* 04/01/2013 1500   CREATININE 2.44* 02/25/2013 0000   CREATININE 2.69* 02/17/2013 0700   CREATININE 2.70* 02/16/2013 1609   CREATININE 2.72* 11/21/2012 1305   CREATININE 2.59* 10/27/2012 0000   CALCIUM 8.7 04/01/2013 1500   CALCIUM 8.8 02/25/2013 0000   CALCIUM 7.9* 02/17/2013  0700   GFRNONAA 16* 04/01/2013 1500   GFRNONAA 16* 02/17/2013 0700   GFRNONAA 16* 02/16/2013 1609   GFRAA 18* 04/01/2013 1500   GFRAA 18* 02/17/2013 0700   GFRAA 18* 02/16/2013 1609   CMP     Component Value Date/Time   NA 140 04/01/2013 1500   K 4.7 04/01/2013 1500   CL 107 04/01/2013 1500   CO2 20 04/01/2013 1500   GLUCOSE 115* 04/01/2013 1500   BUN 66* 04/01/2013 1500   CREATININE 2.72* 04/01/2013 1500   CREATININE 2.44* 02/25/2013 0000   CALCIUM 8.7 04/01/2013 1500   PROT 8.0 02/25/2013 0000   ALBUMIN 3.2* 04/01/2013 1500   AST 15 02/25/2013 0000   ALT 8 02/25/2013 0000   ALKPHOS 103 02/25/2013 0000   BILITOT 0.4 02/25/2013 0000   GFRNONAA 16* 04/01/2013 1500   GFRAA 18* 04/01/2013 1500       Component Value Date/Time   WBC 10.1 02/25/2013 0000   WBC 7.0 02/17/2013 0700   WBC 7.4 02/16/2013 1609   HGB 8.4* 04/01/2013 1432   HGB 9.4* 02/25/2013 0000   HGB 8.4* 02/17/2013 0700   HCT 29.8* 02/25/2013 0000   HCT 26.8* 02/17/2013 0700   HCT 29.2* 02/16/2013 1609   MCV  84.7 02/25/2013 0000   MCV 88.7 02/17/2013 0700   MCV 88.0 02/16/2013 1609    Lipid Panel     Component Value Date/Time   CHOL 179 05/19/2012 1253   TRIG 105 05/19/2012 1253   HDL 32* 05/19/2012 1253   CHOLHDL 5.6 05/19/2012 1253   VLDL 21 05/19/2012 1253   LDLCALC 126* 05/19/2012 1253    ABG No results found for this basename: phart, pco2, pco2art, po2, po2art, hco3, tco2, acidbasedef, o2sat     Lab Results  Component Value Date   TSH 3.814 02/16/2013   BNP (last 3 results) No results found for this basename: PROBNP,  in the last 8760 hours Cardiac Panel (last 3 results) No results found for this basename: CKTOTAL, CKMB, TROPONINI, RELINDX,  in the last 72 hours  Iron/TIBC/Ferritin    Component Value Date/Time   IRON 43 04/01/2013 1436   TIBC 215* 04/01/2013 1436   FERRITIN 811* 04/01/2013 1436     EKG Orders placed in visit on 04/07/13  . EKG 12-LEAD     Prior Assessment and Plan Problem List as of 04/07/2013     Cardiovascular and Mediastinum   Hypertension   Last Assessment & Plan   05/19/2012 Office Visit Written 05/19/2012 12:44 PM by Kathlen Brunswick, MD     Blood pressure is elevated at this visit, the patient reports excellent values at home.  She attributes the high reading in the office today to substantial stress related to illnesses in family members.  She will return to the office in 1-2 weeks with her list of home measurements for a repeat blood pressure assessment.  For now, no changes will be made in her medical regimen.      Endocrine   DIABETES MELLITUS   Last Assessment & Plan   07/19/2011 Office Visit Written 07/19/2011 12:52 PM by Kathlen Brunswick, MD     Hemoglobin A1c of 6.5 in 02/2011.  Adequate control with current medical regime.    Hypothyroid   Last Assessment & Plan   07/19/2011 Office Visit Written 07/19/2011 12:55 PM by Kathlen Brunswick, MD     Patient was euthyroid when last tested 5 months ago.      Genitourinary  Chronic kidney disease, stage 4,  severely decreased GFR   Last Assessment & Plan   05/19/2012 Office Visit Written 05/19/2012 12:39 PM by Kathlen Brunswick, MD     Renal disease has been slowly progressive, but creatinine is actually lower than it was one year ago.  She is doing well under the care of Dr. Hyman Hopes.    Uterine prolapse   Uterovaginal prolapse, complete     Other   OVERWEIGHT/OBESITY   CAROTID BRUIT, LEFT   Last Assessment & Plan   05/19/2012 Office Visit Written 05/19/2012 12:38 PM by Kathlen Brunswick, MD     Most recent carotid ultrasound study 3 years ago showed no significant obstruction; a repeat screening examination will be performed on the basis of bilateral carotid bruits.    Anemia associated with chronic renal failure   Last Assessment & Plan   05/19/2012 Office Visit Edited 05/19/2012 12:42 PM by Kathlen Brunswick, MD     Most recent hemoglobin was 10.3 in 04/2012, which represents improvement with ESA therapy.    Hyperlipidemia   Hyperkalemia   Diabetic foot ulcers       Imaging: No results found.

## 2013-04-07 NOTE — Patient Instructions (Addendum)
Your physician recommends that you schedule a follow-up appointment in: 6 months. You will receive a reminder letter in the mail in about 4 months reminding you to call and schedule your appointment. If you don't receive this letter, please contact our office.  Your physician has requested that you have an echocardiogram. Echocardiography is a painless test that uses sound waves to create images of your heart. It provides your doctor with information about the size and shape of your heart and how well your heart's chambers and valves are working. This procedure takes approximately one hour. There are no restrictions for this procedure.  Your physician recommends that you continue on your current medications as directed. Please refer to the Current Medication list given to you today.

## 2013-04-07 NOTE — Assessment & Plan Note (Signed)
Repeat Doppler ultrasound of the carotids did not find any flow limiting disease.

## 2013-04-14 ENCOUNTER — Other Ambulatory Visit (HOSPITAL_COMMUNITY): Payer: Self-pay | Admitting: *Deleted

## 2013-04-15 ENCOUNTER — Encounter (HOSPITAL_COMMUNITY)
Admission: RE | Admit: 2013-04-15 | Discharge: 2013-04-15 | Disposition: A | Discharge status: Home / Self Care | Payer: PRIVATE HEALTH INSURANCE | Source: Ambulatory Visit | Attending: Nephrology | Admitting: Nephrology

## 2013-04-15 DIAGNOSIS — D638 Anemia in other chronic diseases classified elsewhere: Secondary | ICD-10-CM | POA: Insufficient documentation

## 2013-04-15 DIAGNOSIS — N183 Chronic kidney disease, stage 3 unspecified: Secondary | ICD-10-CM | POA: Insufficient documentation

## 2013-04-15 MED ORDER — EPOETIN ALFA 20000 UNIT/ML IJ SOLN: 20000.0000 [IU] | INTRAMUSCULAR | Status: DC

## 2013-04-15 MED ORDER — EPOETIN ALFA 20000 UNIT/ML IJ SOLN
INTRAMUSCULAR | Status: AC
  Administered 2013-04-15: 20000 [IU] via SUBCUTANEOUS
  Filled 2013-04-15: qty 1

## 2013-04-20 ENCOUNTER — Ambulatory Visit (HOSPITAL_COMMUNITY)
Admission: RE | Admit: 2013-04-20 | Discharge: 2013-04-20 | Disposition: A | Discharge status: Home / Self Care | Payer: PRIVATE HEALTH INSURANCE | Source: Ambulatory Visit | Attending: Adult Health | Admitting: Adult Health

## 2013-04-20 DIAGNOSIS — I051 Rheumatic mitral insufficiency: Secondary | ICD-10-CM

## 2013-04-20 DIAGNOSIS — I10 Essential (primary) hypertension: Secondary | ICD-10-CM

## 2013-04-20 DIAGNOSIS — I059 Rheumatic mitral valve disease, unspecified: Secondary | ICD-10-CM | POA: Insufficient documentation

## 2013-04-20 DIAGNOSIS — Z136 Encounter for screening for cardiovascular disorders: Secondary | ICD-10-CM

## 2013-04-20 DIAGNOSIS — I517 Cardiomegaly: Secondary | ICD-10-CM

## 2013-04-20 DIAGNOSIS — R0989 Other specified symptoms and signs involving the circulatory and respiratory systems: Secondary | ICD-10-CM | POA: Insufficient documentation

## 2013-04-20 DIAGNOSIS — E119 Type 2 diabetes mellitus without complications: Secondary | ICD-10-CM | POA: Insufficient documentation

## 2013-04-20 NOTE — Progress Notes (Signed)
*  PRELIMINARY RESULTS* Echocardiogram 2D Echocardiogram has been performed.  [name] 04/20/2013, 2:10 PM

## 2013-04-29 ENCOUNTER — Encounter (HOSPITAL_COMMUNITY)
Admission: RE | Admit: 2013-04-29 | Discharge: 2013-04-29 | Disposition: A | Discharge status: Home / Self Care | Payer: PRIVATE HEALTH INSURANCE | Source: Ambulatory Visit | Attending: Nephrology | Admitting: Nephrology

## 2013-04-29 LAB — POCT HEMOGLOBIN-HEMACUE: Hemoglobin: 9 g/dL — ABNORMAL LOW (ref 12.0–15.0)

## 2013-04-29 LAB — RENAL FUNCTION PANEL
Albumin: 3.3 g/dL — ABNORMAL LOW (ref 3.5–5.2)
BUN: 69 mg/dL — ABNORMAL HIGH (ref 6–23)
Calcium: 8.6 mg/dL (ref 8.4–10.5)
Phosphorus: 3.4 mg/dL (ref 2.3–4.6)
Potassium: 4.8 mEq/L (ref 3.5–5.1)

## 2013-04-29 LAB — FERRITIN: Ferritin: 612 ng/mL — ABNORMAL HIGH (ref 10–291)

## 2013-04-29 LAB — IRON AND TIBC: Iron: 53 ug/dL (ref 42–135)

## 2013-04-29 MED ORDER — EPOETIN ALFA 20000 UNIT/ML IJ SOLN
INTRAMUSCULAR | Status: AC
  Filled 2013-04-29: qty 1

## 2013-04-29 MED ORDER — EPOETIN ALFA 20000 UNIT/ML IJ SOLN
INTRAMUSCULAR | Status: AC
  Administered 2013-04-29: 20000 [IU] via SUBCUTANEOUS
  Filled 2013-04-29: qty 1

## 2013-04-29 MED ORDER — EPOETIN ALFA 20000 UNIT/ML IJ SOLN: 20000.0000 [IU] | INTRAMUSCULAR | Status: DC

## 2013-05-12 ENCOUNTER — Other Ambulatory Visit (HOSPITAL_COMMUNITY): Payer: Self-pay | Admitting: *Deleted

## 2013-05-13 ENCOUNTER — Encounter (HOSPITAL_COMMUNITY)
Admission: RE | Admit: 2013-05-13 | Discharge: 2013-05-13 | Disposition: A | Discharge status: Home / Self Care | Payer: PRIVATE HEALTH INSURANCE | Source: Ambulatory Visit | Attending: Nephrology | Admitting: Nephrology

## 2013-05-13 DIAGNOSIS — D638 Anemia in other chronic diseases classified elsewhere: Secondary | ICD-10-CM | POA: Insufficient documentation

## 2013-05-13 DIAGNOSIS — N183 Chronic kidney disease, stage 3 unspecified: Secondary | ICD-10-CM | POA: Insufficient documentation

## 2013-05-13 LAB — POCT HEMOGLOBIN-HEMACUE: Hemoglobin: 9.2 g/dL — ABNORMAL LOW (ref 12.0–15.0)

## 2013-05-13 MED ORDER — EPOETIN ALFA 20000 UNIT/ML IJ SOLN
INTRAMUSCULAR | Status: AC
  Filled 2013-05-13: qty 1

## 2013-05-13 MED ORDER — EPOETIN ALFA 20000 UNIT/ML IJ SOLN
20000.0000 [IU] | INTRAMUSCULAR | Status: DC
  Administered 2013-05-13: 20000 [IU] via SUBCUTANEOUS

## 2013-05-20 ENCOUNTER — Encounter (HOSPITAL_COMMUNITY)
Admission: RE | Admit: 2013-05-20 | Discharge: 2013-05-20 | Disposition: A | Discharge status: Home / Self Care | Payer: PRIVATE HEALTH INSURANCE | Source: Ambulatory Visit | Attending: Nephrology | Admitting: Nephrology

## 2013-05-20 MED ORDER — EPOETIN ALFA 20000 UNIT/ML IJ SOLN: 20000.0000 [IU] | INTRAMUSCULAR | Status: DC

## 2013-05-20 MED ORDER — EPOETIN ALFA 20000 UNIT/ML IJ SOLN
INTRAMUSCULAR | Status: AC
  Administered 2013-05-20: 20000 [IU] via SUBCUTANEOUS
  Filled 2013-05-20: qty 1

## 2013-05-27 ENCOUNTER — Encounter (HOSPITAL_COMMUNITY)
Admission: RE | Admit: 2013-05-27 | Discharge: 2013-05-27 | Disposition: A | Discharge status: Home / Self Care | Payer: PRIVATE HEALTH INSURANCE | Source: Ambulatory Visit | Attending: Nephrology | Admitting: Nephrology

## 2013-05-27 LAB — RENAL FUNCTION PANEL
Albumin: 3.3 g/dL — ABNORMAL LOW (ref 3.5–5.2)
Chloride: 105 mEq/L (ref 96–112)
GFR calc Af Amer: 18 mL/min — ABNORMAL LOW (ref >90)
Phosphorus: 3.5 mg/dL (ref 2.3–4.6)
Potassium: 4.1 mEq/L (ref 3.5–5.1)
Sodium: 139 mEq/L (ref 135–145)

## 2013-05-27 MED ORDER — EPOETIN ALFA 20000 UNIT/ML IJ SOLN: 20000.0000 [IU] | INTRAMUSCULAR | Status: DC

## 2013-05-27 MED ORDER — EPOETIN ALFA 20000 UNIT/ML IJ SOLN
INTRAMUSCULAR | Status: AC
  Administered 2013-05-27: 20000 [IU] via SUBCUTANEOUS
  Filled 2013-05-27: qty 1

## 2013-05-28 LAB — IRON AND TIBC
Iron: 17 ug/dL — ABNORMAL LOW (ref 42–135)
TIBC: 209 ug/dL — ABNORMAL LOW (ref 250–470)

## 2013-06-03 ENCOUNTER — Encounter (HOSPITAL_COMMUNITY)
Admission: RE | Admit: 2013-06-03 | Discharge: 2013-06-03 | Disposition: A | Discharge status: Home / Self Care | Payer: PRIVATE HEALTH INSURANCE | Source: Ambulatory Visit | Attending: Nephrology | Admitting: Nephrology

## 2013-06-03 MED ORDER — EPOETIN ALFA 20000 UNIT/ML IJ SOLN: 20000.0000 [IU] | INTRAMUSCULAR | Status: DC

## 2013-06-03 MED ORDER — EPOETIN ALFA 20000 UNIT/ML IJ SOLN
INTRAMUSCULAR | Status: AC
  Administered 2013-06-03: 20000 [IU] via SUBCUTANEOUS
  Filled 2013-06-03: qty 1

## 2013-06-10 ENCOUNTER — Encounter (HOSPITAL_COMMUNITY)
Admission: RE | Admit: 2013-06-10 | Discharge: 2013-06-10 | Disposition: A | Discharge status: Home / Self Care | Payer: PRIVATE HEALTH INSURANCE | Source: Ambulatory Visit | Attending: Nephrology | Admitting: Nephrology

## 2013-06-10 DIAGNOSIS — N183 Chronic kidney disease, stage 3 unspecified: Secondary | ICD-10-CM | POA: Insufficient documentation

## 2013-06-10 DIAGNOSIS — D638 Anemia in other chronic diseases classified elsewhere: Secondary | ICD-10-CM | POA: Insufficient documentation

## 2013-06-10 MED ORDER — EPOETIN ALFA 20000 UNIT/ML IJ SOLN
20000.0000 [IU] | INTRAMUSCULAR | Status: DC
  Administered 2013-06-10: 20000 [IU] via SUBCUTANEOUS

## 2013-06-10 MED ORDER — EPOETIN ALFA 20000 UNIT/ML IJ SOLN
INTRAMUSCULAR | Status: AC
  Administered 2013-06-10: 20000 [IU] via SUBCUTANEOUS
  Filled 2013-06-10: qty 1

## 2013-06-17 ENCOUNTER — Encounter (HOSPITAL_COMMUNITY): Payer: PRIVATE HEALTH INSURANCE

## 2013-06-24 ENCOUNTER — Encounter (HOSPITAL_COMMUNITY)
Admission: RE | Admit: 2013-06-24 | Discharge: 2013-06-24 | Disposition: A | Discharge status: Home / Self Care | Payer: PRIVATE HEALTH INSURANCE | Source: Ambulatory Visit | Attending: Nephrology | Admitting: Nephrology

## 2013-06-24 ENCOUNTER — Encounter (HOSPITAL_COMMUNITY): Payer: PRIVATE HEALTH INSURANCE

## 2013-06-24 LAB — RENAL FUNCTION PANEL
Albumin: 3.6 g/dL (ref 3.5–5.2)
BUN: 65 mg/dL — ABNORMAL HIGH (ref 6–23)
CO2: 20 mEq/L (ref 19–32)
Chloride: 105 mEq/L (ref 96–112)
Creatinine, Ser: 2.67 mg/dL — ABNORMAL HIGH (ref 0.50–1.10)
Glucose, Bld: 94 mg/dL (ref 70–99)
Potassium: 5 mEq/L (ref 3.5–5.1)

## 2013-06-24 LAB — IRON AND TIBC
Iron: 72 ug/dL (ref 42–135)
Saturation Ratios: 35 % (ref 20–55)
TIBC: 206 ug/dL — ABNORMAL LOW (ref 250–470)
UIBC: 134 ug/dL (ref 125–400)

## 2013-06-24 LAB — FERRITIN: Ferritin: 412 ng/mL — ABNORMAL HIGH (ref 10–291)

## 2013-06-24 MED ORDER — EPOETIN ALFA 20000 UNIT/ML IJ SOLN
20000.0000 [IU] | INTRAMUSCULAR | Status: DC
  Administered 2013-06-24: 20000 [IU] via SUBCUTANEOUS

## 2013-06-24 MED ORDER — EPOETIN ALFA 20000 UNIT/ML IJ SOLN
INTRAMUSCULAR | Status: AC
  Filled 2013-06-24: qty 1

## 2013-07-01 ENCOUNTER — Encounter (HOSPITAL_COMMUNITY)
Admission: RE | Admit: 2013-07-01 | Discharge: 2013-07-01 | Disposition: A | Discharge status: Home / Self Care | Payer: PRIVATE HEALTH INSURANCE | Source: Ambulatory Visit | Attending: Nephrology | Admitting: Nephrology

## 2013-07-01 LAB — POCT HEMOGLOBIN-HEMACUE: Hemoglobin: 10.9 g/dL — ABNORMAL LOW (ref 12.0–15.0)

## 2013-07-01 MED ORDER — EPOETIN ALFA 20000 UNIT/ML IJ SOLN
INTRAMUSCULAR | Status: AC
  Filled 2013-07-01: qty 1

## 2013-07-01 MED ORDER — EPOETIN ALFA 20000 UNIT/ML IJ SOLN
20000.0000 [IU] | INTRAMUSCULAR | Status: DC
  Administered 2013-07-01: 20000 [IU] via SUBCUTANEOUS

## 2013-07-08 ENCOUNTER — Encounter (HOSPITAL_COMMUNITY)
Admission: RE | Admit: 2013-07-08 | Discharge: 2013-07-08 | Disposition: A | Discharge status: Home / Self Care | Payer: PRIVATE HEALTH INSURANCE | Source: Ambulatory Visit | Attending: Nephrology | Admitting: Nephrology

## 2013-07-08 MED ORDER — EPOETIN ALFA 20000 UNIT/ML IJ SOLN: 20000.0000 [IU] | INTRAMUSCULAR | Status: DC

## 2013-07-09 LAB — POCT HEMOGLOBIN-HEMACUE: Hemoglobin: 11 g/dL — ABNORMAL LOW (ref 12.0–15.0)

## 2013-07-22 ENCOUNTER — Encounter (HOSPITAL_COMMUNITY)
Admission: RE | Admit: 2013-07-22 | Discharge: 2013-07-22 | Disposition: A | Discharge status: Home / Self Care | Payer: PRIVATE HEALTH INSURANCE | Source: Ambulatory Visit | Attending: Nephrology | Admitting: Nephrology

## 2013-07-22 DIAGNOSIS — D638 Anemia in other chronic diseases classified elsewhere: Secondary | ICD-10-CM | POA: Insufficient documentation

## 2013-07-22 DIAGNOSIS — N183 Chronic kidney disease, stage 3 unspecified: Secondary | ICD-10-CM | POA: Insufficient documentation

## 2013-07-22 LAB — RENAL FUNCTION PANEL
Albumin: 3.5 g/dL (ref 3.5–5.2)
BUN: 73 mg/dL — ABNORMAL HIGH (ref 6–23)
Calcium: 8.8 mg/dL (ref 8.4–10.5)
Creatinine, Ser: 3.45 mg/dL — ABNORMAL HIGH (ref 0.50–1.10)
Phosphorus: 3.6 mg/dL (ref 2.3–4.6)
Potassium: 5.4 mEq/L — ABNORMAL HIGH (ref 3.5–5.1)

## 2013-07-22 LAB — IRON AND TIBC: Iron: 60 ug/dL (ref 42–135)

## 2013-07-22 LAB — POCT HEMOGLOBIN-HEMACUE: Hemoglobin: 10.1 g/dL — ABNORMAL LOW (ref 12.0–15.0)

## 2013-07-22 LAB — FERRITIN: Ferritin: 615 ng/mL — ABNORMAL HIGH (ref 10–291)

## 2013-07-22 MED ORDER — EPOETIN ALFA 20000 UNIT/ML IJ SOLN
20000.0000 [IU] | INTRAMUSCULAR | Status: DC
  Administered 2013-07-22: 15:00:00 20000 [IU] via SUBCUTANEOUS

## 2013-07-22 MED ORDER — EPOETIN ALFA 20000 UNIT/ML IJ SOLN
INTRAMUSCULAR | Status: AC
  Filled 2013-07-22: qty 1

## 2013-07-29 ENCOUNTER — Encounter (HOSPITAL_COMMUNITY)
Admission: RE | Admit: 2013-07-29 | Discharge: 2013-07-29 | Disposition: A | Discharge status: Home / Self Care | Payer: PRIVATE HEALTH INSURANCE | Source: Ambulatory Visit | Attending: Nephrology | Admitting: Nephrology

## 2013-07-29 MED ORDER — EPOETIN ALFA 20000 UNIT/ML IJ SOLN: 20000.0000 [IU] | INTRAMUSCULAR | Status: DC

## 2013-07-29 MED ORDER — EPOETIN ALFA 20000 UNIT/ML IJ SOLN
INTRAMUSCULAR | Status: AC
  Administered 2013-07-29: 20000 [IU]
  Filled 2013-07-29: qty 1

## 2013-08-03 ENCOUNTER — Encounter (INDEPENDENT_AMBULATORY_CARE_PROVIDER_SITE_OTHER): Payer: Self-pay

## 2013-08-03 ENCOUNTER — Encounter: Payer: Self-pay | Admitting: Obstetrics & Gynecology

## 2013-08-03 ENCOUNTER — Ambulatory Visit (INDEPENDENT_AMBULATORY_CARE_PROVIDER_SITE_OTHER): Payer: PRIVATE HEALTH INSURANCE | Admitting: Obstetrics & Gynecology

## 2013-08-03 VITALS — BP 150/70 | Wt 236.0 lb

## 2013-08-03 DIAGNOSIS — N813 Complete uterovaginal prolapse: Secondary | ICD-10-CM

## 2013-08-03 NOTE — Progress Notes (Signed)
Patient ID: [name], female   DOB: 07-Oct-1931, 79 y.o.   MRN: [id number] Pt with Gelhorn pessary in place due to complete procidentia No problems or complaints No bleeding or discharge  Pessary is in proper place without any vaginal wall breakdown or undue pressure noted  Follow up in 6 months

## 2013-08-05 ENCOUNTER — Encounter (HOSPITAL_COMMUNITY): Payer: PRIVATE HEALTH INSURANCE

## 2013-08-05 ENCOUNTER — Encounter (HOSPITAL_COMMUNITY)
Admission: RE | Admit: 2013-08-05 | Discharge: 2013-08-05 | Disposition: A | Discharge status: Home / Self Care | Payer: PRIVATE HEALTH INSURANCE | Source: Ambulatory Visit | Attending: Nephrology | Admitting: Nephrology

## 2013-08-05 MED ORDER — EPOETIN ALFA 20000 UNIT/ML IJ SOLN
INTRAMUSCULAR | Status: AC
  Filled 2013-08-05: qty 1

## 2013-08-05 MED ORDER — EPOETIN ALFA 20000 UNIT/ML IJ SOLN
20000.0000 [IU] | INTRAMUSCULAR | Status: DC
  Administered 2013-08-05: 11:00:00 20000 [IU] via SUBCUTANEOUS

## 2013-08-11 ENCOUNTER — Other Ambulatory Visit (HOSPITAL_COMMUNITY): Payer: Self-pay | Admitting: *Deleted

## 2013-08-12 ENCOUNTER — Encounter (HOSPITAL_COMMUNITY)
Admission: RE | Admit: 2013-08-12 | Discharge: 2013-08-12 | Disposition: A | Discharge status: Home / Self Care | Payer: PRIVATE HEALTH INSURANCE | Source: Ambulatory Visit | Attending: Nephrology | Admitting: Nephrology

## 2013-08-12 DIAGNOSIS — D638 Anemia in other chronic diseases classified elsewhere: Secondary | ICD-10-CM | POA: Insufficient documentation

## 2013-08-12 DIAGNOSIS — N183 Chronic kidney disease, stage 3 unspecified: Secondary | ICD-10-CM | POA: Insufficient documentation

## 2013-08-12 LAB — POCT HEMOGLOBIN-HEMACUE: Hemoglobin: 10.7 g/dL — ABNORMAL LOW (ref 12.0–15.0)

## 2013-08-12 MED ORDER — EPOETIN ALFA 20000 UNIT/ML IJ SOLN
INTRAMUSCULAR | Status: AC
  Filled 2013-08-12: qty 1

## 2013-08-12 MED ORDER — EPOETIN ALFA 20000 UNIT/ML IJ SOLN
20000.0000 [IU] | INTRAMUSCULAR | Status: DC
  Administered 2013-08-12: 15:00:00 20000 [IU] via SUBCUTANEOUS

## 2013-08-19 ENCOUNTER — Encounter (HOSPITAL_COMMUNITY)
Admission: RE | Admit: 2013-08-19 | Discharge: 2013-08-19 | Disposition: A | Discharge status: Home / Self Care | Payer: PRIVATE HEALTH INSURANCE | Source: Ambulatory Visit | Attending: Nephrology | Admitting: Nephrology

## 2013-08-19 LAB — IRON AND TIBC
Saturation Ratios: 16 % — ABNORMAL LOW (ref 20–55)
TIBC: 210 ug/dL — ABNORMAL LOW (ref 250–470)
UIBC: 176 ug/dL (ref 125–400)

## 2013-08-19 LAB — RENAL FUNCTION PANEL
CO2: 20 mEq/L (ref 19–32)
Calcium: 8.4 mg/dL (ref 8.4–10.5)
Chloride: 110 mEq/L (ref 96–112)
Creatinine, Ser: 2.83 mg/dL — ABNORMAL HIGH (ref 0.50–1.10)
GFR calc non Af Amer: 15 mL/min — ABNORMAL LOW (ref >90)
Glucose, Bld: 206 mg/dL — ABNORMAL HIGH (ref 70–99)
Phosphorus: 4.1 mg/dL (ref 2.3–4.6)
Potassium: 4.8 mEq/L (ref 3.5–5.1)

## 2013-08-19 LAB — POCT HEMOGLOBIN-HEMACUE: Hemoglobin: 11 g/dL — ABNORMAL LOW (ref 12.0–15.0)

## 2013-08-19 MED ORDER — EPOETIN ALFA 20000 UNIT/ML IJ SOLN: 20000.0000 [IU] | INTRAMUSCULAR | Status: DC

## 2013-09-07 ENCOUNTER — Encounter (HOSPITAL_COMMUNITY)
Admission: RE | Admit: 2013-09-07 | Discharge: 2013-09-07 | Disposition: A | Discharge status: Home / Self Care | Payer: PRIVATE HEALTH INSURANCE | Source: Ambulatory Visit | Attending: Nephrology | Admitting: Nephrology

## 2013-09-07 MED ORDER — EPOETIN ALFA 20000 UNIT/ML IJ SOLN
INTRAMUSCULAR | Status: AC
  Filled 2013-09-07: qty 1

## 2013-09-07 MED ORDER — EPOETIN ALFA 20000 UNIT/ML IJ SOLN
20000.0000 [IU] | INTRAMUSCULAR | Status: DC
  Administered 2013-09-07: 20000 [IU] via SUBCUTANEOUS

## 2013-09-16 ENCOUNTER — Encounter (HOSPITAL_COMMUNITY)
Admission: RE | Admit: 2013-09-16 | Discharge: 2013-09-16 | Disposition: A | Discharge status: Home / Self Care | Payer: PRIVATE HEALTH INSURANCE | Source: Ambulatory Visit | Attending: Nephrology | Admitting: Nephrology

## 2013-09-16 DIAGNOSIS — D638 Anemia in other chronic diseases classified elsewhere: Secondary | ICD-10-CM | POA: Insufficient documentation

## 2013-09-16 DIAGNOSIS — N183 Chronic kidney disease, stage 3 unspecified: Secondary | ICD-10-CM | POA: Insufficient documentation

## 2013-09-16 LAB — RENAL FUNCTION PANEL
Albumin: 3.5 g/dL (ref 3.5–5.2)
BUN: 86 mg/dL — AB (ref 6–23)
CHLORIDE: 107 meq/L (ref 96–112)
CO2: 21 mEq/L (ref 19–32)
CREATININE: 3.03 mg/dL — AB (ref 0.50–1.10)
Calcium: 8.5 mg/dL (ref 8.4–10.5)
GFR calc Af Amer: 16 mL/min — ABNORMAL LOW (ref >90)
GFR calc non Af Amer: 14 mL/min — ABNORMAL LOW (ref >90)
GLUCOSE: 128 mg/dL — AB (ref 70–99)
Phosphorus: 4.7 mg/dL — ABNORMAL HIGH (ref 2.3–4.6)
Potassium: 5.1 mEq/L (ref 3.7–5.3)
Sodium: 144 mEq/L (ref 137–147)

## 2013-09-16 LAB — POCT HEMOGLOBIN-HEMACUE: Hemoglobin: 11 g/dL — ABNORMAL LOW (ref 12.0–15.0)

## 2013-09-16 LAB — IRON AND TIBC
Iron: 36 ug/dL — ABNORMAL LOW (ref 42–135)
Saturation Ratios: 16 % — ABNORMAL LOW (ref 20–55)
TIBC: 221 ug/dL — AB (ref 250–470)
UIBC: 185 ug/dL (ref 125–400)

## 2013-09-16 LAB — FERRITIN: FERRITIN: 488 ng/mL — AB (ref 10–291)

## 2013-09-16 MED ORDER — EPOETIN ALFA 20000 UNIT/ML IJ SOLN: 20000.0000 [IU] | INTRAMUSCULAR | Status: DC

## 2013-09-30 ENCOUNTER — Encounter (HOSPITAL_COMMUNITY)
Admission: RE | Admit: 2013-09-30 | Discharge: 2013-09-30 | Disposition: A | Discharge status: Home / Self Care | Payer: PRIVATE HEALTH INSURANCE | Source: Ambulatory Visit | Attending: Nephrology | Admitting: Nephrology

## 2013-09-30 LAB — POCT HEMOGLOBIN-HEMACUE: Hemoglobin: 9.6 g/dL — ABNORMAL LOW (ref 12.0–15.0)

## 2013-09-30 MED ORDER — EPOETIN ALFA 20000 UNIT/ML IJ SOLN
20000.0000 [IU] | INTRAMUSCULAR | Status: DC
  Administered 2013-09-30: 14:00:00 20000 [IU] via SUBCUTANEOUS

## 2013-09-30 MED ORDER — EPOETIN ALFA 20000 UNIT/ML IJ SOLN
INTRAMUSCULAR | Status: AC
  Filled 2013-09-30: qty 1

## 2013-10-07 ENCOUNTER — Encounter (HOSPITAL_COMMUNITY)
Admission: RE | Admit: 2013-10-07 | Discharge: 2013-10-07 | Disposition: A | Discharge status: Home / Self Care | Payer: PRIVATE HEALTH INSURANCE | Source: Ambulatory Visit | Attending: Nephrology | Admitting: Nephrology

## 2013-10-07 LAB — POCT HEMOGLOBIN-HEMACUE: Hemoglobin: 10.1 g/dL — ABNORMAL LOW (ref 12.0–15.0)

## 2013-10-07 MED ORDER — EPOETIN ALFA 20000 UNIT/ML IJ SOLN
20000.0000 [IU] | INTRAMUSCULAR | Status: DC
  Administered 2013-10-07: 14:00:00 20000 [IU] via SUBCUTANEOUS

## 2013-10-07 MED ORDER — EPOETIN ALFA 20000 UNIT/ML IJ SOLN
INTRAMUSCULAR | Status: AC
  Filled 2013-10-07: qty 1

## 2013-10-14 ENCOUNTER — Encounter (HOSPITAL_COMMUNITY)
Admission: RE | Admit: 2013-10-14 | Discharge: 2013-10-14 | Disposition: A | Discharge status: Home / Self Care | Payer: PRIVATE HEALTH INSURANCE | Source: Ambulatory Visit | Attending: Nephrology | Admitting: Nephrology

## 2013-10-14 DIAGNOSIS — N183 Chronic kidney disease, stage 3 unspecified: Secondary | ICD-10-CM | POA: Insufficient documentation

## 2013-10-14 DIAGNOSIS — D638 Anemia in other chronic diseases classified elsewhere: Secondary | ICD-10-CM | POA: Insufficient documentation

## 2013-10-14 LAB — IRON AND TIBC
Iron: 31 ug/dL — ABNORMAL LOW (ref 42–135)
SATURATION RATIOS: 14 % — AB (ref 20–55)
TIBC: 214 ug/dL — ABNORMAL LOW (ref 250–470)
UIBC: 183 ug/dL (ref 125–400)

## 2013-10-14 LAB — FERRITIN: FERRITIN: 538 ng/mL — AB (ref 10–291)

## 2013-10-14 LAB — RENAL FUNCTION PANEL
Albumin: 3.4 g/dL — ABNORMAL LOW (ref 3.5–5.2)
BUN: 71 mg/dL — AB (ref 6–23)
CALCIUM: 9.2 mg/dL (ref 8.4–10.5)
CO2: 21 meq/L (ref 19–32)
CREATININE: 2.94 mg/dL — AB (ref 0.50–1.10)
Chloride: 105 mEq/L (ref 96–112)
GFR calc non Af Amer: 14 mL/min — ABNORMAL LOW (ref >90)
GFR, EST AFRICAN AMERICAN: 16 mL/min — AB (ref >90)
GLUCOSE: 192 mg/dL — AB (ref 70–99)
Phosphorus: 3.9 mg/dL (ref 2.3–4.6)
Potassium: 4.8 mEq/L (ref 3.7–5.3)
Sodium: 141 mEq/L (ref 137–147)

## 2013-10-14 MED ORDER — EPOETIN ALFA 20000 UNIT/ML IJ SOLN
20000.0000 [IU] | INTRAMUSCULAR | Status: DC
  Administered 2013-10-14: 20000 [IU] via SUBCUTANEOUS

## 2013-10-14 MED ORDER — EPOETIN ALFA 20000 UNIT/ML IJ SOLN
INTRAMUSCULAR | Status: AC
  Administered 2013-10-14: 15:00:00 20000 [IU] via SUBCUTANEOUS
  Filled 2013-10-14: qty 1

## 2013-10-15 LAB — POCT HEMOGLOBIN-HEMACUE: Hemoglobin: 10 g/dL — ABNORMAL LOW (ref 12.0–15.0)

## 2013-10-16 ENCOUNTER — Other Ambulatory Visit (HOSPITAL_COMMUNITY): Payer: Self-pay | Admitting: Internal Medicine

## 2013-10-16 ENCOUNTER — Ambulatory Visit (HOSPITAL_COMMUNITY)
Admission: RE | Admit: 2013-10-16 | Discharge: 2013-10-16 | Disposition: A | Discharge status: Home / Self Care | Payer: PRIVATE HEALTH INSURANCE | Source: Ambulatory Visit | Attending: Internal Medicine | Admitting: Internal Medicine

## 2013-10-16 DIAGNOSIS — R51 Headache: Secondary | ICD-10-CM | POA: Insufficient documentation

## 2013-10-21 ENCOUNTER — Encounter (HOSPITAL_COMMUNITY)
Admission: RE | Admit: 2013-10-21 | Discharge: 2013-10-21 | Disposition: A | Discharge status: Home / Self Care | Payer: PRIVATE HEALTH INSURANCE | Source: Ambulatory Visit | Attending: Nephrology | Admitting: Nephrology

## 2013-10-21 LAB — POCT HEMOGLOBIN-HEMACUE: Hemoglobin: 10.6 g/dL — ABNORMAL LOW (ref 12.0–15.0)

## 2013-10-21 MED ORDER — EPOETIN ALFA 20000 UNIT/ML IJ SOLN: 20000.0000 [IU] | INTRAMUSCULAR | Status: DC

## 2013-10-21 MED ORDER — EPOETIN ALFA 20000 UNIT/ML IJ SOLN
INTRAMUSCULAR | Status: AC
  Administered 2013-10-21: 20000 [IU]
  Filled 2013-10-21: qty 1

## 2013-10-27 ENCOUNTER — Ambulatory Visit: Payer: Self-pay | Admitting: Obstetrics & Gynecology

## 2013-10-28 ENCOUNTER — Inpatient Hospital Stay (HOSPITAL_COMMUNITY): Admission: RE | Admit: 2013-10-28 | Payer: PRIVATE HEALTH INSURANCE | Source: Ambulatory Visit

## 2013-11-04 ENCOUNTER — Other Ambulatory Visit (HOSPITAL_COMMUNITY): Payer: Self-pay | Admitting: *Deleted

## 2013-11-05 ENCOUNTER — Ambulatory Visit: Payer: Self-pay | Admitting: Obstetrics & Gynecology

## 2013-11-06 ENCOUNTER — Encounter (HOSPITAL_COMMUNITY)
Admission: RE | Admit: 2013-11-06 | Discharge: 2013-11-06 | Disposition: A | Discharge status: Home / Self Care | Payer: PRIVATE HEALTH INSURANCE | Source: Ambulatory Visit | Attending: Nephrology | Admitting: Nephrology

## 2013-11-06 ENCOUNTER — Inpatient Hospital Stay (HOSPITAL_COMMUNITY): Admission: RE | Admit: 2013-11-06 | Payer: PRIVATE HEALTH INSURANCE | Source: Ambulatory Visit

## 2013-11-06 LAB — POCT HEMOGLOBIN-HEMACUE: Hemoglobin: 10.3 g/dL — ABNORMAL LOW (ref 12.0–15.0)

## 2013-11-06 MED ORDER — EPOETIN ALFA 20000 UNIT/ML IJ SOLN
20000.0000 [IU] | INTRAMUSCULAR | Status: DC
  Administered 2013-11-06: 20000 [IU] via SUBCUTANEOUS

## 2013-11-06 MED ORDER — EPOETIN ALFA 20000 UNIT/ML IJ SOLN
INTRAMUSCULAR | Status: AC
  Filled 2013-11-06: qty 1

## 2013-11-11 ENCOUNTER — Encounter (HOSPITAL_COMMUNITY)
Admission: RE | Admit: 2013-11-11 | Discharge: 2013-11-11 | Disposition: A | Discharge status: Home / Self Care | Payer: PRIVATE HEALTH INSURANCE | Source: Ambulatory Visit | Attending: Nephrology | Admitting: Nephrology

## 2013-11-11 DIAGNOSIS — N183 Chronic kidney disease, stage 3 unspecified: Secondary | ICD-10-CM | POA: Insufficient documentation

## 2013-11-11 DIAGNOSIS — D638 Anemia in other chronic diseases classified elsewhere: Secondary | ICD-10-CM | POA: Insufficient documentation

## 2013-11-11 LAB — RENAL FUNCTION PANEL
Albumin: 3.2 g/dL — ABNORMAL LOW (ref 3.5–5.2)
BUN: 68 mg/dL — AB (ref 6–23)
CHLORIDE: 107 meq/L (ref 96–112)
CO2: 20 meq/L (ref 19–32)
Calcium: 8.8 mg/dL (ref 8.4–10.5)
Creatinine, Ser: 3.06 mg/dL — ABNORMAL HIGH (ref 0.50–1.10)
GFR calc non Af Amer: 13 mL/min — ABNORMAL LOW (ref >90)
GFR, EST AFRICAN AMERICAN: 16 mL/min — AB (ref >90)
GLUCOSE: 85 mg/dL (ref 70–99)
POTASSIUM: 4.4 meq/L (ref 3.7–5.3)
Phosphorus: 4.2 mg/dL (ref 2.3–4.6)
SODIUM: 145 meq/L (ref 137–147)

## 2013-11-11 LAB — IRON AND TIBC
IRON: 28 ug/dL — AB (ref 42–135)
SATURATION RATIOS: 12 % — AB (ref 20–55)
TIBC: 225 ug/dL — AB (ref 250–470)
UIBC: 197 ug/dL (ref 125–400)

## 2013-11-11 LAB — FERRITIN: FERRITIN: 420 ng/mL — AB (ref 10–291)

## 2013-11-11 LAB — POCT HEMOGLOBIN-HEMACUE: Hemoglobin: 9.8 g/dL — ABNORMAL LOW (ref 12.0–15.0)

## 2013-11-11 MED ORDER — EPOETIN ALFA 20000 UNIT/ML IJ SOLN
20000.0000 [IU] | INTRAMUSCULAR | Status: DC
  Administered 2013-11-11: 20000 [IU] via SUBCUTANEOUS

## 2013-11-11 MED ORDER — EPOETIN ALFA 20000 UNIT/ML IJ SOLN
INTRAMUSCULAR | Status: AC
  Administered 2013-11-11: 20000 [IU] via SUBCUTANEOUS
  Filled 2013-11-11: qty 1

## 2013-11-16 ENCOUNTER — Encounter (INDEPENDENT_AMBULATORY_CARE_PROVIDER_SITE_OTHER): Payer: Self-pay

## 2013-11-16 ENCOUNTER — Ambulatory Visit (INDEPENDENT_AMBULATORY_CARE_PROVIDER_SITE_OTHER): Payer: PRIVATE HEALTH INSURANCE | Admitting: Obstetrics & Gynecology

## 2013-11-16 ENCOUNTER — Encounter: Payer: Self-pay | Admitting: Obstetrics & Gynecology

## 2013-11-16 VITALS — BP 140/60 | Wt 241.0 lb

## 2013-11-16 DIAGNOSIS — N813 Complete uterovaginal prolapse: Secondary | ICD-10-CM

## 2013-11-16 NOTE — Progress Notes (Signed)
Patient ID: [name], female   DOB: 11-20-1931, 80 y.o.   MRN: [id number] Patient ID: [name], female   DOB: 1931/12/19, 80 y.o.   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 CHF (congestive heart failure)      preserved left ventricular systolic function  . Hypertension   . Overweight   . Hypothyroid   . Vaginal pessary present ~ 2012  . CKD (chronic kidney disease) stage 4, GFR 15-29 ml/min     [name] 02/16/2013  . Type II diabetes mellitus   . Anemia associated with chronic renal failure     ESA therapy  . History of blood transfusion 1975  . Rheumatoid arthritis   . Osteoporosis   . Pneumonia 02/2011    [name] 02/20/2011 (02/16/2013)  . Diabetic foot ulcers     "get them on borh feet" (02/16/2013)    Past Surgical History  Procedure Laterality Date  . Cataract extraction w/ intraocular lens  implant, bilateral Bilateral 1990's  . Orif femur fracture Left 1975    "got a rod and screw in it" (02/16/2013)    OB History   Grav Para Term Preterm Abortions TAB SAB Ect Mult Living                  Allergies  Allergen Reactions  . Iodinated Diagnostic Agents Other (See Comments)    REACTION:  unknown    History   Social History  . Marital Status: Divorced    Spouse Name: N/A    Number of Children: N/A  . Years of Education: N/A   Occupational History  . Retired - [employer]    Social History Main Topics  . Smoking status: Never Smoker   . Smokeless tobacco: Never Used  . Alcohol Use: No  . Drug Use: No  . Sexual Activity: No   Other Topics Concern  . None   Social History Narrative   Divorced   No regular exercise    Family History  Problem Relation Age of Onset  . Stroke Other   . Heart disease Other   .  Kidney disease Other   . Cancer Daughter     cervical  . Cancer Grandchild     lymph nodes

## 2013-11-18 ENCOUNTER — Encounter (HOSPITAL_COMMUNITY)
Admission: RE | Admit: 2013-11-18 | Discharge: 2013-11-18 | Disposition: A | Discharge status: Home / Self Care | Payer: PRIVATE HEALTH INSURANCE | Source: Ambulatory Visit | Attending: Nephrology | Admitting: Nephrology

## 2013-11-18 LAB — POCT HEMOGLOBIN-HEMACUE: Hemoglobin: 10.9 g/dL — ABNORMAL LOW (ref 12.0–15.0)

## 2013-11-18 MED ORDER — EPOETIN ALFA 20000 UNIT/ML IJ SOLN: 20000.0000 [IU] | INTRAMUSCULAR | Status: DC

## 2013-11-18 MED ORDER — EPOETIN ALFA 20000 UNIT/ML IJ SOLN
INTRAMUSCULAR | Status: AC
  Administered 2013-11-18: 20000 [IU] via SUBCUTANEOUS
  Filled 2013-11-18: qty 1

## 2013-11-25 ENCOUNTER — Encounter (HOSPITAL_COMMUNITY)
Admission: RE | Admit: 2013-11-25 | Discharge: 2013-11-25 | Disposition: A | Discharge status: Home / Self Care | Payer: PRIVATE HEALTH INSURANCE | Source: Ambulatory Visit | Attending: Nephrology | Admitting: Nephrology

## 2013-11-25 LAB — POCT HEMOGLOBIN-HEMACUE: Hemoglobin: 10.9 g/dL — ABNORMAL LOW (ref 12.0–15.0)

## 2013-11-25 MED ORDER — EPOETIN ALFA 20000 UNIT/ML IJ SOLN
INTRAMUSCULAR | Status: AC
  Filled 2013-11-25: qty 1

## 2013-11-25 MED ORDER — EPOETIN ALFA 20000 UNIT/ML IJ SOLN
20000.0000 [IU] | INTRAMUSCULAR | Status: DC
  Administered 2013-11-25: 20000 [IU] via SUBCUTANEOUS

## 2013-12-02 ENCOUNTER — Encounter (HOSPITAL_COMMUNITY)
Admission: RE | Admit: 2013-12-02 | Discharge: 2013-12-02 | Disposition: A | Discharge status: Home / Self Care | Payer: PRIVATE HEALTH INSURANCE | Source: Ambulatory Visit | Attending: Nephrology | Admitting: Nephrology

## 2013-12-02 LAB — POCT HEMOGLOBIN-HEMACUE: Hemoglobin: 10.8 g/dL — ABNORMAL LOW (ref 12.0–15.0)

## 2013-12-02 MED ORDER — EPOETIN ALFA 20000 UNIT/ML IJ SOLN
INTRAMUSCULAR | Status: AC
  Administered 2013-12-02: 20000 [IU] via SUBCUTANEOUS
  Filled 2013-12-02: qty 1

## 2013-12-02 MED ORDER — EPOETIN ALFA 20000 UNIT/ML IJ SOLN
20000.0000 [IU] | INTRAMUSCULAR | Status: DC
  Administered 2013-12-02: 20000 [IU] via SUBCUTANEOUS

## 2013-12-09 ENCOUNTER — Encounter (HOSPITAL_COMMUNITY)
Admission: RE | Admit: 2013-12-09 | Discharge: 2013-12-09 | Disposition: A | Discharge status: Home / Self Care | Payer: PRIVATE HEALTH INSURANCE | Source: Ambulatory Visit | Attending: Nephrology | Admitting: Nephrology

## 2013-12-09 DIAGNOSIS — D638 Anemia in other chronic diseases classified elsewhere: Secondary | ICD-10-CM | POA: Insufficient documentation

## 2013-12-09 DIAGNOSIS — N183 Chronic kidney disease, stage 3 unspecified: Secondary | ICD-10-CM | POA: Insufficient documentation

## 2013-12-09 LAB — POCT HEMOGLOBIN-HEMACUE: Hemoglobin: 10.4 g/dL — ABNORMAL LOW (ref 12.0–15.0)

## 2013-12-09 MED ORDER — EPOETIN ALFA 20000 UNIT/ML IJ SOLN
INTRAMUSCULAR | Status: AC
  Filled 2013-12-09: qty 1

## 2013-12-09 MED ORDER — EPOETIN ALFA 20000 UNIT/ML IJ SOLN
20000.0000 [IU] | INTRAMUSCULAR | Status: DC
  Administered 2013-12-09: 20000 [IU] via SUBCUTANEOUS

## 2013-12-16 ENCOUNTER — Encounter (HOSPITAL_COMMUNITY)
Admission: RE | Admit: 2013-12-16 | Discharge: 2013-12-16 | Disposition: A | Discharge status: Home / Self Care | Payer: PRIVATE HEALTH INSURANCE | Source: Ambulatory Visit | Attending: Nephrology | Admitting: Nephrology

## 2013-12-16 LAB — RENAL FUNCTION PANEL
ALBUMIN: 3.2 g/dL — AB (ref 3.5–5.2)
BUN: 78 mg/dL — ABNORMAL HIGH (ref 6–23)
CALCIUM: 8.6 mg/dL (ref 8.4–10.5)
CHLORIDE: 106 meq/L (ref 96–112)
CO2: 21 mEq/L (ref 19–32)
CREATININE: 3.23 mg/dL — AB (ref 0.50–1.10)
GFR calc Af Amer: 15 mL/min — ABNORMAL LOW (ref >90)
GFR, EST NON AFRICAN AMERICAN: 13 mL/min — AB (ref >90)
Glucose, Bld: 93 mg/dL (ref 70–99)
Phosphorus: 3.2 mg/dL (ref 2.3–4.6)
Potassium: 4.4 mEq/L (ref 3.7–5.3)
Sodium: 142 mEq/L (ref 137–147)

## 2013-12-16 LAB — IRON AND TIBC
Iron: 28 ug/dL — ABNORMAL LOW (ref 42–135)
Saturation Ratios: 13 % — ABNORMAL LOW (ref 20–55)
TIBC: 216 ug/dL — ABNORMAL LOW (ref 250–470)
UIBC: 188 ug/dL (ref 125–400)

## 2013-12-16 LAB — FERRITIN: Ferritin: 338 ng/mL — ABNORMAL HIGH (ref 10–291)

## 2013-12-16 LAB — POCT HEMOGLOBIN-HEMACUE: HEMOGLOBIN: 10.2 g/dL — AB (ref 12.0–15.0)

## 2013-12-16 MED ORDER — EPOETIN ALFA 20000 UNIT/ML IJ SOLN
20000.0000 [IU] | INTRAMUSCULAR | Status: DC
  Administered 2013-12-16: 20000 [IU] via SUBCUTANEOUS

## 2013-12-16 MED ORDER — EPOETIN ALFA 20000 UNIT/ML IJ SOLN
INTRAMUSCULAR | Status: AC
  Filled 2013-12-16: qty 1

## 2013-12-23 ENCOUNTER — Encounter (HOSPITAL_COMMUNITY)
Admission: RE | Admit: 2013-12-23 | Discharge: 2013-12-23 | Disposition: A | Discharge status: Home / Self Care | Payer: PRIVATE HEALTH INSURANCE | Source: Ambulatory Visit | Attending: Nephrology | Admitting: Nephrology

## 2013-12-23 LAB — POCT HEMOGLOBIN-HEMACUE: HEMOGLOBIN: 10.4 g/dL — AB (ref 12.0–15.0)

## 2013-12-23 MED ORDER — EPOETIN ALFA 20000 UNIT/ML IJ SOLN
INTRAMUSCULAR | Status: AC
  Administered 2013-12-23: 20000 [IU] via SUBCUTANEOUS
  Filled 2013-12-23: qty 1

## 2013-12-23 MED ORDER — EPOETIN ALFA 20000 UNIT/ML IJ SOLN: 20000.0000 [IU] | INTRAMUSCULAR | Status: DC

## 2013-12-30 ENCOUNTER — Encounter (HOSPITAL_COMMUNITY)
Admission: RE | Admit: 2013-12-30 | Discharge: 2013-12-30 | Disposition: A | Discharge status: Home / Self Care | Payer: PRIVATE HEALTH INSURANCE | Source: Ambulatory Visit | Attending: Nephrology | Admitting: Nephrology

## 2013-12-30 LAB — POCT HEMOGLOBIN-HEMACUE: Hemoglobin: 10.8 g/dL — ABNORMAL LOW (ref 12.0–15.0)

## 2013-12-30 MED ORDER — EPOETIN ALFA 20000 UNIT/ML IJ SOLN
INTRAMUSCULAR | Status: AC
  Filled 2013-12-30: qty 1

## 2013-12-30 MED ORDER — EPOETIN ALFA 20000 UNIT/ML IJ SOLN
20000.0000 [IU] | INTRAMUSCULAR | Status: DC
  Administered 2013-12-30: 20000 [IU] via SUBCUTANEOUS

## 2014-01-13 ENCOUNTER — Encounter (HOSPITAL_COMMUNITY)
Admission: RE | Admit: 2014-01-13 | Discharge: 2014-01-13 | Disposition: A | Discharge status: Home / Self Care | Payer: PRIVATE HEALTH INSURANCE | Source: Ambulatory Visit | Attending: Nephrology | Admitting: Nephrology

## 2014-01-13 DIAGNOSIS — N183 Chronic kidney disease, stage 3 unspecified: Secondary | ICD-10-CM | POA: Insufficient documentation

## 2014-01-13 DIAGNOSIS — D638 Anemia in other chronic diseases classified elsewhere: Secondary | ICD-10-CM | POA: Insufficient documentation

## 2014-01-13 LAB — POCT HEMOGLOBIN-HEMACUE: HEMOGLOBIN: 11 g/dL — AB (ref 12.0–15.0)

## 2014-01-13 MED ORDER — EPOETIN ALFA 20000 UNIT/ML IJ SOLN: 20000.0000 [IU] | INTRAMUSCULAR | Status: DC

## 2014-01-27 ENCOUNTER — Encounter (HOSPITAL_COMMUNITY)
Admission: RE | Admit: 2014-01-27 | Discharge: 2014-01-27 | Disposition: A | Discharge status: Home / Self Care | Payer: PRIVATE HEALTH INSURANCE | Source: Ambulatory Visit | Attending: Nephrology | Admitting: Nephrology

## 2014-01-27 LAB — IRON AND TIBC
Iron: 56 ug/dL (ref 42–135)
SATURATION RATIOS: 30 % (ref 20–55)
TIBC: 187 ug/dL — AB (ref 250–470)
UIBC: 131 ug/dL (ref 125–400)

## 2014-01-27 LAB — POCT HEMOGLOBIN-HEMACUE: Hemoglobin: 10.3 g/dL — ABNORMAL LOW (ref 12.0–15.0)

## 2014-01-27 LAB — RENAL FUNCTION PANEL
Albumin: 3.1 g/dL — ABNORMAL LOW (ref 3.5–5.2)
BUN: 68 mg/dL — ABNORMAL HIGH (ref 6–23)
CHLORIDE: 101 meq/L (ref 96–112)
CO2: 20 meq/L (ref 19–32)
Calcium: 8.6 mg/dL (ref 8.4–10.5)
Creatinine, Ser: 3.28 mg/dL — ABNORMAL HIGH (ref 0.50–1.10)
GFR calc non Af Amer: 12 mL/min — ABNORMAL LOW (ref >90)
GFR, EST AFRICAN AMERICAN: 14 mL/min — AB (ref >90)
Glucose, Bld: 126 mg/dL — ABNORMAL HIGH (ref 70–99)
Phosphorus: 2.9 mg/dL (ref 2.3–4.6)
Potassium: 3.7 mEq/L (ref 3.7–5.3)
SODIUM: 136 meq/L — AB (ref 137–147)

## 2014-01-27 LAB — FERRITIN: Ferritin: 719 ng/mL — ABNORMAL HIGH (ref 10–291)

## 2014-01-27 MED ORDER — EPOETIN ALFA 20000 UNIT/ML IJ SOLN
20000.0000 [IU] | INTRAMUSCULAR | Status: DC
  Administered 2014-01-27: 20000 [IU] via SUBCUTANEOUS

## 2014-01-27 MED ORDER — EPOETIN ALFA 20000 UNIT/ML IJ SOLN
INTRAMUSCULAR | Status: AC
  Administered 2014-01-27: 20000 [IU] via SUBCUTANEOUS
  Filled 2014-01-27: qty 1

## 2014-02-03 ENCOUNTER — Encounter (HOSPITAL_COMMUNITY)
Admission: RE | Admit: 2014-02-03 | Discharge: 2014-02-03 | Disposition: A | Discharge status: Home / Self Care | Payer: PRIVATE HEALTH INSURANCE | Source: Ambulatory Visit | Attending: Nephrology | Admitting: Nephrology

## 2014-02-03 LAB — POCT HEMOGLOBIN-HEMACUE: Hemoglobin: 10 g/dL — ABNORMAL LOW (ref 12.0–15.0)

## 2014-02-03 MED ORDER — EPOETIN ALFA 20000 UNIT/ML IJ SOLN
20000.0000 [IU] | INTRAMUSCULAR | Status: DC
  Administered 2014-02-03: 20000 [IU] via SUBCUTANEOUS

## 2014-02-03 MED ORDER — EPOETIN ALFA 20000 UNIT/ML IJ SOLN
INTRAMUSCULAR | Status: AC
  Filled 2014-02-03: qty 1

## 2014-02-10 ENCOUNTER — Encounter (HOSPITAL_COMMUNITY)
Admission: RE | Admit: 2014-02-10 | Discharge: 2014-02-10 | Disposition: A | Discharge status: Home / Self Care | Payer: PRIVATE HEALTH INSURANCE | Source: Ambulatory Visit | Attending: Nephrology | Admitting: Nephrology

## 2014-02-10 DIAGNOSIS — D638 Anemia in other chronic diseases classified elsewhere: Secondary | ICD-10-CM | POA: Diagnosis present

## 2014-02-10 DIAGNOSIS — N183 Chronic kidney disease, stage 3 unspecified: Secondary | ICD-10-CM | POA: Insufficient documentation

## 2014-02-10 LAB — POCT HEMOGLOBIN-HEMACUE: HEMOGLOBIN: 9.9 g/dL — AB (ref 12.0–15.0)

## 2014-02-10 MED ORDER — EPOETIN ALFA 20000 UNIT/ML IJ SOLN
INTRAMUSCULAR | Status: AC
  Administered 2014-02-10: 20000 [IU] via SUBCUTANEOUS
  Filled 2014-02-10: qty 1

## 2014-02-10 MED ORDER — EPOETIN ALFA 20000 UNIT/ML IJ SOLN: 20000.0000 [IU] | INTRAMUSCULAR | Status: DC

## 2014-02-17 ENCOUNTER — Encounter (HOSPITAL_COMMUNITY)
Admission: RE | Admit: 2014-02-17 | Discharge: 2014-02-17 | Disposition: A | Discharge status: Home / Self Care | Payer: PRIVATE HEALTH INSURANCE | Source: Ambulatory Visit | Attending: Nephrology | Admitting: Nephrology

## 2014-02-17 DIAGNOSIS — D638 Anemia in other chronic diseases classified elsewhere: Secondary | ICD-10-CM | POA: Diagnosis not present

## 2014-02-17 LAB — POCT HEMOGLOBIN-HEMACUE: Hemoglobin: 10.3 g/dL — ABNORMAL LOW (ref 12.0–15.0)

## 2014-02-17 MED ORDER — EPOETIN ALFA 20000 UNIT/ML IJ SOLN
INTRAMUSCULAR | Status: AC
  Filled 2014-02-17: qty 1

## 2014-02-17 MED ORDER — EPOETIN ALFA 20000 UNIT/ML IJ SOLN
20000.0000 [IU] | INTRAMUSCULAR | Status: DC
  Administered 2014-02-17: 20000 [IU] via SUBCUTANEOUS

## 2014-02-24 ENCOUNTER — Encounter (HOSPITAL_COMMUNITY)
Admission: RE | Admit: 2014-02-24 | Discharge: 2014-02-24 | Disposition: A | Discharge status: Home / Self Care | Payer: PRIVATE HEALTH INSURANCE | Source: Ambulatory Visit | Attending: Nephrology | Admitting: Nephrology

## 2014-02-24 DIAGNOSIS — D638 Anemia in other chronic diseases classified elsewhere: Secondary | ICD-10-CM | POA: Diagnosis not present

## 2014-02-24 LAB — POCT HEMOGLOBIN-HEMACUE: Hemoglobin: 10 g/dL — ABNORMAL LOW (ref 12.0–15.0)

## 2014-02-24 MED ORDER — EPOETIN ALFA 20000 UNIT/ML IJ SOLN: 20000.0000 [IU] | INTRAMUSCULAR | Status: DC

## 2014-02-24 MED ORDER — EPOETIN ALFA 20000 UNIT/ML IJ SOLN
INTRAMUSCULAR | Status: AC
  Administered 2014-02-24: 20000 [IU] via SUBCUTANEOUS
  Filled 2014-02-24: qty 1

## 2014-03-03 ENCOUNTER — Encounter (HOSPITAL_COMMUNITY)
Admission: RE | Admit: 2014-03-03 | Discharge: 2014-03-03 | Disposition: A | Discharge status: Home / Self Care | Payer: PRIVATE HEALTH INSURANCE | Source: Ambulatory Visit | Attending: Nephrology | Admitting: Nephrology

## 2014-03-03 DIAGNOSIS — D638 Anemia in other chronic diseases classified elsewhere: Secondary | ICD-10-CM | POA: Diagnosis not present

## 2014-03-03 LAB — RENAL FUNCTION PANEL
Albumin: 3.1 g/dL — ABNORMAL LOW (ref 3.5–5.2)
BUN: 53 mg/dL — ABNORMAL HIGH (ref 6–23)
CALCIUM: 8.8 mg/dL (ref 8.4–10.5)
CO2: 20 meq/L (ref 19–32)
CREATININE: 3.17 mg/dL — AB (ref 0.50–1.10)
Chloride: 104 mEq/L (ref 96–112)
GFR calc Af Amer: 15 mL/min — ABNORMAL LOW (ref >90)
GFR calc non Af Amer: 13 mL/min — ABNORMAL LOW (ref >90)
GLUCOSE: 159 mg/dL — AB (ref 70–99)
PHOSPHORUS: 3.5 mg/dL (ref 2.3–4.6)
Potassium: 4.4 mEq/L (ref 3.7–5.3)
SODIUM: 141 meq/L (ref 137–147)

## 2014-03-03 LAB — FERRITIN: Ferritin: 402 ng/mL — ABNORMAL HIGH (ref 10–291)

## 2014-03-03 LAB — IRON AND TIBC
Iron: 22 ug/dL — ABNORMAL LOW (ref 42–135)
Saturation Ratios: 12 % — ABNORMAL LOW (ref 20–55)
TIBC: 188 ug/dL — ABNORMAL LOW (ref 250–470)
UIBC: 166 ug/dL (ref 125–400)

## 2014-03-03 LAB — POCT HEMOGLOBIN-HEMACUE: Hemoglobin: 9.5 g/dL — ABNORMAL LOW (ref 12.0–15.0)

## 2014-03-03 MED ORDER — EPOETIN ALFA 20000 UNIT/ML IJ SOLN
20000.0000 [IU] | INTRAMUSCULAR | Status: DC
  Administered 2014-03-03: 20000 [IU] via SUBCUTANEOUS

## 2014-03-03 MED ORDER — EPOETIN ALFA 20000 UNIT/ML IJ SOLN
INTRAMUSCULAR | Status: AC
  Filled 2014-03-03: qty 1

## 2014-03-10 ENCOUNTER — Encounter (HOSPITAL_COMMUNITY)
Admission: RE | Admit: 2014-03-10 | Discharge: 2014-03-10 | Disposition: A | Discharge status: Home / Self Care | Payer: PRIVATE HEALTH INSURANCE | Source: Ambulatory Visit | Attending: Nephrology | Admitting: Nephrology

## 2014-03-10 DIAGNOSIS — D638 Anemia in other chronic diseases classified elsewhere: Secondary | ICD-10-CM | POA: Insufficient documentation

## 2014-03-10 DIAGNOSIS — N183 Chronic kidney disease, stage 3 unspecified: Secondary | ICD-10-CM | POA: Diagnosis not present

## 2014-03-10 LAB — POCT HEMOGLOBIN-HEMACUE: Hemoglobin: 10 g/dL — ABNORMAL LOW (ref 12.0–15.0)

## 2014-03-10 MED ORDER — EPOETIN ALFA 20000 UNIT/ML IJ SOLN
20000.0000 [IU] | INTRAMUSCULAR | Status: DC
  Administered 2014-03-10: 20000 [IU] via SUBCUTANEOUS

## 2014-03-10 MED ORDER — EPOETIN ALFA 20000 UNIT/ML IJ SOLN
INTRAMUSCULAR | Status: AC
  Filled 2014-03-10: qty 1

## 2014-03-10 MED ORDER — SODIUM CHLORIDE 0.9 % IV SOLN
510.0000 mg | INTRAVENOUS | Status: DC
  Administered 2014-03-10: 510 mg via INTRAVENOUS
  Filled 2014-03-10: qty 17

## 2014-03-16 ENCOUNTER — Other Ambulatory Visit (HOSPITAL_COMMUNITY): Payer: Self-pay | Admitting: *Deleted

## 2014-03-17 ENCOUNTER — Encounter (HOSPITAL_COMMUNITY)
Admission: RE | Admit: 2014-03-17 | Discharge: 2014-03-17 | Disposition: A | Discharge status: Home / Self Care | Payer: PRIVATE HEALTH INSURANCE | Source: Ambulatory Visit | Attending: Nephrology | Admitting: Nephrology

## 2014-03-17 DIAGNOSIS — D638 Anemia in other chronic diseases classified elsewhere: Secondary | ICD-10-CM | POA: Diagnosis not present

## 2014-03-17 MED ORDER — SODIUM CHLORIDE 0.9 % IV SOLN
510.0000 mg | INTRAVENOUS | Status: AC
  Administered 2014-03-17: 510 mg via INTRAVENOUS
  Filled 2014-03-17: qty 17

## 2014-03-17 MED ORDER — EPOETIN ALFA 20000 UNIT/ML IJ SOLN
20000.0000 [IU] | INTRAMUSCULAR | Status: DC
  Administered 2014-03-17: 20000 [IU] via SUBCUTANEOUS

## 2014-03-17 MED ORDER — EPOETIN ALFA 20000 UNIT/ML IJ SOLN
INTRAMUSCULAR | Status: AC
  Filled 2014-03-17: qty 1

## 2014-03-18 LAB — POCT HEMOGLOBIN-HEMACUE: Hemoglobin: 9.4 g/dL — ABNORMAL LOW (ref 12.0–15.0)

## 2014-03-24 ENCOUNTER — Encounter (HOSPITAL_COMMUNITY)
Admission: RE | Admit: 2014-03-24 | Discharge: 2014-03-24 | Disposition: A | Discharge status: Home / Self Care | Payer: PRIVATE HEALTH INSURANCE | Source: Ambulatory Visit | Attending: Nephrology | Admitting: Nephrology

## 2014-03-24 DIAGNOSIS — D638 Anemia in other chronic diseases classified elsewhere: Secondary | ICD-10-CM | POA: Diagnosis not present

## 2014-03-24 LAB — POCT HEMOGLOBIN-HEMACUE: Hemoglobin: 10 g/dL — ABNORMAL LOW (ref 12.0–15.0)

## 2014-03-24 MED ORDER — EPOETIN ALFA 20000 UNIT/ML IJ SOLN
INTRAMUSCULAR | Status: AC
  Filled 2014-03-24: qty 1

## 2014-03-24 MED ORDER — EPOETIN ALFA 20000 UNIT/ML IJ SOLN
20000.0000 [IU] | INTRAMUSCULAR | Status: DC
  Administered 2014-03-24: 20000 [IU] via SUBCUTANEOUS

## 2014-03-31 ENCOUNTER — Encounter (HOSPITAL_COMMUNITY)
Admission: RE | Admit: 2014-03-31 | Discharge: 2014-03-31 | Disposition: A | Discharge status: Home / Self Care | Payer: PRIVATE HEALTH INSURANCE | Source: Ambulatory Visit | Attending: Nephrology | Admitting: Nephrology

## 2014-03-31 DIAGNOSIS — N184 Chronic kidney disease, stage 4 (severe): Secondary | ICD-10-CM | POA: Diagnosis present

## 2014-03-31 DIAGNOSIS — I129 Hypertensive chronic kidney disease with stage 1 through stage 4 chronic kidney disease, or unspecified chronic kidney disease: Secondary | ICD-10-CM | POA: Insufficient documentation

## 2014-03-31 DIAGNOSIS — N039 Chronic nephritic syndrome with unspecified morphologic changes: Principal | ICD-10-CM

## 2014-03-31 DIAGNOSIS — D631 Anemia in chronic kidney disease: Secondary | ICD-10-CM | POA: Diagnosis not present

## 2014-03-31 LAB — RENAL FUNCTION PANEL
Albumin: 3.3 g/dL — ABNORMAL LOW (ref 3.5–5.2)
Anion gap: 17 — ABNORMAL HIGH (ref 5–15)
BUN: 66 mg/dL — ABNORMAL HIGH (ref 6–23)
CO2: 20 meq/L (ref 19–32)
CREATININE: 3.17 mg/dL — AB (ref 0.50–1.10)
Calcium: 8.7 mg/dL (ref 8.4–10.5)
Chloride: 101 mEq/L (ref 96–112)
GFR calc Af Amer: 15 mL/min — ABNORMAL LOW (ref >90)
GFR, EST NON AFRICAN AMERICAN: 13 mL/min — AB (ref >90)
GLUCOSE: 102 mg/dL — AB (ref 70–99)
Phosphorus: 4.3 mg/dL (ref 2.3–4.6)
Potassium: 4.5 mEq/L (ref 3.7–5.3)
Sodium: 138 mEq/L (ref 137–147)

## 2014-03-31 LAB — FERRITIN: FERRITIN: 1206 ng/mL — AB (ref 10–291)

## 2014-03-31 LAB — POCT HEMOGLOBIN-HEMACUE: Hemoglobin: 10.2 g/dL — ABNORMAL LOW (ref 12.0–15.0)

## 2014-03-31 LAB — IRON AND TIBC
Iron: 39 ug/dL — ABNORMAL LOW (ref 42–135)
Saturation Ratios: 21 % (ref 20–55)
TIBC: 184 ug/dL — AB (ref 250–470)
UIBC: 145 ug/dL (ref 125–400)

## 2014-03-31 MED ORDER — EPOETIN ALFA 20000 UNIT/ML IJ SOLN: 20000.0000 [IU] | INTRAMUSCULAR | Status: DC

## 2014-03-31 MED ORDER — EPOETIN ALFA 20000 UNIT/ML IJ SOLN
INTRAMUSCULAR | Status: AC
  Administered 2014-03-31: 20000 [IU] via SUBCUTANEOUS
  Filled 2014-03-31: qty 1

## 2014-04-07 ENCOUNTER — Encounter (HOSPITAL_COMMUNITY)
Admission: RE | Admit: 2014-04-07 | Discharge: 2014-04-07 | Disposition: A | Discharge status: Home / Self Care | Payer: PRIVATE HEALTH INSURANCE | Source: Ambulatory Visit | Attending: Nephrology | Admitting: Nephrology

## 2014-04-07 DIAGNOSIS — D631 Anemia in chronic kidney disease: Secondary | ICD-10-CM | POA: Insufficient documentation

## 2014-04-07 DIAGNOSIS — N184 Chronic kidney disease, stage 4 (severe): Secondary | ICD-10-CM | POA: Insufficient documentation

## 2014-04-07 DIAGNOSIS — I129 Hypertensive chronic kidney disease with stage 1 through stage 4 chronic kidney disease, or unspecified chronic kidney disease: Secondary | ICD-10-CM | POA: Diagnosis not present

## 2014-04-07 DIAGNOSIS — N039 Chronic nephritic syndrome with unspecified morphologic changes: Principal | ICD-10-CM

## 2014-04-07 LAB — POCT HEMOGLOBIN-HEMACUE: Hemoglobin: 10.6 g/dL — ABNORMAL LOW (ref 12.0–15.0)

## 2014-04-07 MED ORDER — EPOETIN ALFA 20000 UNIT/ML IJ SOLN
INTRAMUSCULAR | Status: AC
  Administered 2014-04-07: 20000 [IU] via SUBCUTANEOUS
  Filled 2014-04-07: qty 1

## 2014-04-07 MED ORDER — EPOETIN ALFA 20000 UNIT/ML IJ SOLN: 20000.0000 [IU] | INTRAMUSCULAR | Status: DC

## 2014-04-14 ENCOUNTER — Encounter (HOSPITAL_COMMUNITY)
Admission: RE | Admit: 2014-04-14 | Discharge: 2014-04-14 | Disposition: A | Discharge status: Home / Self Care | Payer: PRIVATE HEALTH INSURANCE | Source: Ambulatory Visit | Attending: Nephrology | Admitting: Nephrology

## 2014-04-14 DIAGNOSIS — N183 Chronic kidney disease, stage 3 unspecified: Secondary | ICD-10-CM | POA: Diagnosis not present

## 2014-04-14 DIAGNOSIS — D638 Anemia in other chronic diseases classified elsewhere: Secondary | ICD-10-CM | POA: Diagnosis present

## 2014-04-14 LAB — POCT HEMOGLOBIN-HEMACUE: HEMOGLOBIN: 10.6 g/dL — AB (ref 12.0–15.0)

## 2014-04-14 MED ORDER — EPOETIN ALFA 20000 UNIT/ML IJ SOLN
20000.0000 [IU] | INTRAMUSCULAR | Status: DC
  Administered 2014-04-14: 20000 [IU] via SUBCUTANEOUS

## 2014-04-14 MED ORDER — EPOETIN ALFA 20000 UNIT/ML IJ SOLN
INTRAMUSCULAR | Status: AC
  Filled 2014-04-14: qty 1

## 2014-04-21 ENCOUNTER — Encounter (HOSPITAL_COMMUNITY)
Admission: RE | Admit: 2014-04-21 | Discharge: 2014-04-21 | Disposition: A | Discharge status: Home / Self Care | Payer: PRIVATE HEALTH INSURANCE | Source: Ambulatory Visit | Attending: Nephrology | Admitting: Nephrology

## 2014-04-21 DIAGNOSIS — D638 Anemia in other chronic diseases classified elsewhere: Secondary | ICD-10-CM | POA: Diagnosis not present

## 2014-04-21 LAB — POCT HEMOGLOBIN-HEMACUE: HEMOGLOBIN: 10.6 g/dL — AB (ref 12.0–15.0)

## 2014-04-21 MED ORDER — EPOETIN ALFA 20000 UNIT/ML IJ SOLN: 20000.0000 [IU] | INTRAMUSCULAR | Status: DC

## 2014-04-21 MED ORDER — EPOETIN ALFA 20000 UNIT/ML IJ SOLN
INTRAMUSCULAR | Status: AC
  Administered 2014-04-21: 20000 [IU] via SUBCUTANEOUS
  Filled 2014-04-21: qty 1

## 2014-04-28 ENCOUNTER — Encounter (HOSPITAL_COMMUNITY)
Admission: RE | Admit: 2014-04-28 | Discharge: 2014-04-28 | Disposition: A | Discharge status: Home / Self Care | Payer: PRIVATE HEALTH INSURANCE | Source: Ambulatory Visit | Attending: Nephrology | Admitting: Nephrology

## 2014-04-28 DIAGNOSIS — D638 Anemia in other chronic diseases classified elsewhere: Secondary | ICD-10-CM | POA: Diagnosis not present

## 2014-04-28 LAB — RENAL FUNCTION PANEL
ALBUMIN: 3.3 g/dL — AB (ref 3.5–5.2)
ANION GAP: 16 — AB (ref 5–15)
BUN: 69 mg/dL — ABNORMAL HIGH (ref 6–23)
CHLORIDE: 104 meq/L (ref 96–112)
CO2: 21 meq/L (ref 19–32)
Calcium: 9.3 mg/dL (ref 8.4–10.5)
Creatinine, Ser: 3.56 mg/dL — ABNORMAL HIGH (ref 0.50–1.10)
GFR, EST AFRICAN AMERICAN: 13 mL/min — AB (ref >90)
GFR, EST NON AFRICAN AMERICAN: 11 mL/min — AB (ref >90)
Glucose, Bld: 125 mg/dL — ABNORMAL HIGH (ref 70–99)
POTASSIUM: 4.6 meq/L (ref 3.7–5.3)
Phosphorus: 4 mg/dL (ref 2.3–4.6)
Sodium: 141 mEq/L (ref 137–147)

## 2014-04-28 LAB — IRON AND TIBC
IRON: 30 ug/dL — AB (ref 42–135)
Saturation Ratios: 16 % — ABNORMAL LOW (ref 20–55)
TIBC: 193 ug/dL — ABNORMAL LOW (ref 250–470)
UIBC: 163 ug/dL (ref 125–400)

## 2014-04-28 LAB — FERRITIN: FERRITIN: 862 ng/mL — AB (ref 10–291)

## 2014-04-28 LAB — POCT HEMOGLOBIN-HEMACUE: HEMOGLOBIN: 11 g/dL — AB (ref 12.0–15.0)

## 2014-04-28 MED ORDER — EPOETIN ALFA 20000 UNIT/ML IJ SOLN: 20000.0000 [IU] | INTRAMUSCULAR | Status: DC

## 2014-05-12 ENCOUNTER — Encounter (HOSPITAL_COMMUNITY)
Admission: RE | Admit: 2014-05-12 | Discharge: 2014-05-12 | Disposition: A | Discharge status: Home / Self Care | Payer: PRIVATE HEALTH INSURANCE | Source: Ambulatory Visit | Attending: Nephrology | Admitting: Nephrology

## 2014-05-12 DIAGNOSIS — D638 Anemia in other chronic diseases classified elsewhere: Secondary | ICD-10-CM | POA: Insufficient documentation

## 2014-05-12 DIAGNOSIS — N183 Chronic kidney disease, stage 3 unspecified: Secondary | ICD-10-CM | POA: Diagnosis not present

## 2014-05-12 LAB — POCT HEMOGLOBIN-HEMACUE: HEMOGLOBIN: 10.6 g/dL — AB (ref 12.0–15.0)

## 2014-05-12 MED ORDER — EPOETIN ALFA 20000 UNIT/ML IJ SOLN
INTRAMUSCULAR | Status: AC
  Administered 2014-05-12: 20000 [IU] via SUBCUTANEOUS
  Filled 2014-05-12: qty 1

## 2014-05-12 MED ORDER — EPOETIN ALFA 20000 UNIT/ML IJ SOLN: 20000.0000 [IU] | INTRAMUSCULAR | Status: DC

## 2014-05-19 ENCOUNTER — Ambulatory Visit: Payer: PRIVATE HEALTH INSURANCE | Admitting: Obstetrics & Gynecology

## 2014-05-19 ENCOUNTER — Encounter (HOSPITAL_COMMUNITY)
Admission: RE | Admit: 2014-05-19 | Discharge: 2014-05-19 | Disposition: A | Discharge status: Home / Self Care | Payer: PRIVATE HEALTH INSURANCE | Source: Ambulatory Visit | Attending: Nephrology | Admitting: Nephrology

## 2014-05-19 DIAGNOSIS — D638 Anemia in other chronic diseases classified elsewhere: Secondary | ICD-10-CM | POA: Diagnosis not present

## 2014-05-19 LAB — POCT HEMOGLOBIN-HEMACUE: Hemoglobin: 10.3 g/dL — ABNORMAL LOW (ref 12.0–15.0)

## 2014-05-19 MED ORDER — EPOETIN ALFA 20000 UNIT/ML IJ SOLN
INTRAMUSCULAR | Status: AC
  Filled 2014-05-19: qty 1

## 2014-05-19 MED ORDER — EPOETIN ALFA 20000 UNIT/ML IJ SOLN
20000.0000 [IU] | INTRAMUSCULAR | Status: DC
  Administered 2014-05-19: 20000 [IU] via SUBCUTANEOUS

## 2014-05-20 ENCOUNTER — Encounter: Payer: Self-pay | Admitting: Obstetrics & Gynecology

## 2014-05-20 ENCOUNTER — Ambulatory Visit (INDEPENDENT_AMBULATORY_CARE_PROVIDER_SITE_OTHER): Payer: PRIVATE HEALTH INSURANCE | Admitting: Obstetrics & Gynecology

## 2014-05-20 VITALS — BP 140/60 | Wt 233.0 lb

## 2014-05-20 DIAGNOSIS — N813 Complete uterovaginal prolapse: Secondary | ICD-10-CM

## 2014-05-20 NOTE — Progress Notes (Signed)
Patient ID: [name], female   DOB: [birth year], 80 y.o.   MRN: [id number]    Progress Notes    Patient ID: [name], female   DOB: August 07, 1932, 80 y.o.   MRN: [id number] Patient ID: [name], female   DOB: [birth year], 80 y.o.   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  CHF (congestive heart failure)          preserved left ventricular systolic function   .  Hypertension     .  Overweight     .  Hypothyroid     .  Vaginal pessary present  ~ 2012   .  CKD (chronic kidney disease) stage 4, GFR 15-29 ml/min         [name] 02/16/2013   .  Type II diabetes mellitus     .  Anemia associated with chronic renal failure         ESA therapy   .  History of blood transfusion  1975   .  Rheumatoid arthritis     .  Osteoporosis     .  Pneumonia  02/2011       [name] 02/20/2011 (02/16/2013)   .  Diabetic foot ulcers         "get them on borh feet" (02/16/2013)         Past Surgical History   Procedure  Laterality  Date   .  Cataract extraction w/ intraocular lens  implant, bilateral  Bilateral  1990's   .  Orif femur fracture  Left  1975       "got a rod and screw in it" (02/16/2013)         OB History     Grav  Para  Term  Preterm  Abortions  TAB  SAB  Ect  Mult  Living                                   Allergies   Allergen  Reactions   .  Iodinated Diagnostic Agents  Other (See Comments)       REACTION:  unknown         History       Social History   .  Marital Status:  Divorced       Spouse Name:  N/A       Number of Children:  N/A   .  Years of Education:  N/A       Occupational History   .  Retired - [employer]         Social History Main Topics   .  Smoking status:  Never Smoker    .  Smokeless  tobacco:  Never Used   .  Alcohol Use:  No   .  Drug Use:  No   .  Sexual Activity:  No       Other Topics  Concern   .  None       Social History Narrative     Divorced     No regular exercise         Family  History   Problem  Relation  Age of Onset   .  Stroke  Other     .  Heart disease  Other     .  Kidney disease  Other     .  Cancer  Daughter         cervical   .  Cancer  Grandchild         lymph nodes

## 2014-05-26 ENCOUNTER — Encounter (HOSPITAL_COMMUNITY)
Admission: RE | Admit: 2014-05-26 | Discharge: 2014-05-26 | Disposition: A | Discharge status: Home / Self Care | Payer: PRIVATE HEALTH INSURANCE | Source: Ambulatory Visit | Attending: Nephrology | Admitting: Nephrology

## 2014-05-26 DIAGNOSIS — I129 Hypertensive chronic kidney disease with stage 1 through stage 4 chronic kidney disease, or unspecified chronic kidney disease: Secondary | ICD-10-CM | POA: Insufficient documentation

## 2014-05-26 DIAGNOSIS — N184 Chronic kidney disease, stage 4 (severe): Secondary | ICD-10-CM | POA: Diagnosis present

## 2014-05-26 DIAGNOSIS — N039 Chronic nephritic syndrome with unspecified morphologic changes: Principal | ICD-10-CM

## 2014-05-26 DIAGNOSIS — D631 Anemia in chronic kidney disease: Secondary | ICD-10-CM | POA: Insufficient documentation

## 2014-05-26 LAB — IRON AND TIBC
IRON: 37 ug/dL — AB (ref 42–135)
Saturation Ratios: 20 % (ref 20–55)
TIBC: 182 ug/dL — ABNORMAL LOW (ref 250–470)
UIBC: 145 ug/dL (ref 125–400)

## 2014-05-26 LAB — RENAL FUNCTION PANEL
ANION GAP: 17 — AB (ref 5–15)
Albumin: 3.3 g/dL — ABNORMAL LOW (ref 3.5–5.2)
BUN: 65 mg/dL — ABNORMAL HIGH (ref 6–23)
CHLORIDE: 100 meq/L (ref 96–112)
CO2: 21 mEq/L (ref 19–32)
Calcium: 9.2 mg/dL (ref 8.4–10.5)
Creatinine, Ser: 3.52 mg/dL — ABNORMAL HIGH (ref 0.50–1.10)
GFR calc Af Amer: 13 mL/min — ABNORMAL LOW (ref >90)
GFR, EST NON AFRICAN AMERICAN: 11 mL/min — AB (ref >90)
Glucose, Bld: 174 mg/dL — ABNORMAL HIGH (ref 70–99)
PHOSPHORUS: 3.5 mg/dL (ref 2.3–4.6)
POTASSIUM: 4.2 meq/L (ref 3.7–5.3)
SODIUM: 138 meq/L (ref 137–147)

## 2014-05-26 LAB — POCT HEMOGLOBIN-HEMACUE: Hemoglobin: 10.9 g/dL — ABNORMAL LOW (ref 12.0–15.0)

## 2014-05-26 MED ORDER — EPOETIN ALFA 20000 UNIT/ML IJ SOLN
20000.0000 [IU] | INTRAMUSCULAR | Status: DC
  Administered 2014-05-26: 20000 [IU] via SUBCUTANEOUS

## 2014-05-26 MED ORDER — EPOETIN ALFA 20000 UNIT/ML IJ SOLN
INTRAMUSCULAR | Status: AC
  Administered 2014-05-26: 20000 [IU] via SUBCUTANEOUS
  Filled 2014-05-26: qty 1

## 2014-05-27 LAB — FERRITIN: FERRITIN: 884 ng/mL — AB (ref 10–291)

## 2014-06-02 ENCOUNTER — Encounter (HOSPITAL_COMMUNITY)
Admission: RE | Admit: 2014-06-02 | Discharge: 2014-06-02 | Disposition: A | Discharge status: Home / Self Care | Payer: PRIVATE HEALTH INSURANCE | Source: Ambulatory Visit | Attending: Nephrology | Admitting: Nephrology

## 2014-06-02 DIAGNOSIS — D638 Anemia in other chronic diseases classified elsewhere: Secondary | ICD-10-CM | POA: Diagnosis not present

## 2014-06-02 LAB — POCT HEMOGLOBIN-HEMACUE: HEMOGLOBIN: 11.5 g/dL — AB (ref 12.0–15.0)

## 2014-06-02 MED ORDER — EPOETIN ALFA 20000 UNIT/ML IJ SOLN: 20000.0000 [IU] | INTRAMUSCULAR | Status: DC

## 2014-06-09 ENCOUNTER — Encounter (HOSPITAL_COMMUNITY): Payer: PRIVATE HEALTH INSURANCE

## 2014-06-16 ENCOUNTER — Encounter (HOSPITAL_COMMUNITY)
Admission: RE | Admit: 2014-06-16 | Discharge: 2014-06-16 | Disposition: A | Discharge status: Home / Self Care | Payer: PRIVATE HEALTH INSURANCE | Source: Ambulatory Visit | Attending: Nephrology | Admitting: Nephrology

## 2014-06-16 DIAGNOSIS — D631 Anemia in chronic kidney disease: Secondary | ICD-10-CM | POA: Diagnosis present

## 2014-06-16 DIAGNOSIS — N183 Chronic kidney disease, stage 3 (moderate): Secondary | ICD-10-CM | POA: Diagnosis not present

## 2014-06-16 LAB — POCT HEMOGLOBIN-HEMACUE: HEMOGLOBIN: 10.2 g/dL — AB (ref 12.0–15.0)

## 2014-06-16 MED ORDER — EPOETIN ALFA 20000 UNIT/ML IJ SOLN: 20000.0000 [IU] | INTRAMUSCULAR | Status: DC

## 2014-06-16 MED ORDER — EPOETIN ALFA 20000 UNIT/ML IJ SOLN
INTRAMUSCULAR | Status: AC
  Administered 2014-06-16: 20000 [IU] via SUBCUTANEOUS
  Filled 2014-06-16: qty 1

## 2014-06-23 ENCOUNTER — Inpatient Hospital Stay (HOSPITAL_COMMUNITY): Admission: RE | Admit: 2014-06-23 | Payer: PRIVATE HEALTH INSURANCE | Source: Ambulatory Visit

## 2014-06-30 ENCOUNTER — Encounter (HOSPITAL_COMMUNITY)
Admission: RE | Admit: 2014-06-30 | Discharge: 2014-06-30 | Disposition: A | Discharge status: Home / Self Care | Payer: PRIVATE HEALTH INSURANCE | Source: Ambulatory Visit | Attending: Nephrology | Admitting: Nephrology

## 2014-06-30 DIAGNOSIS — D631 Anemia in chronic kidney disease: Secondary | ICD-10-CM | POA: Diagnosis not present

## 2014-06-30 LAB — POCT HEMOGLOBIN-HEMACUE: Hemoglobin: 10 g/dL — ABNORMAL LOW (ref 12.0–15.0)

## 2014-06-30 LAB — IRON AND TIBC
IRON: 97 ug/dL (ref 42–135)
Saturation Ratios: 54 % (ref 20–55)
TIBC: 181 ug/dL — AB (ref 250–470)
UIBC: 84 ug/dL — ABNORMAL LOW (ref 125–400)

## 2014-06-30 LAB — FERRITIN: Ferritin: 950 ng/mL — ABNORMAL HIGH (ref 10–291)

## 2014-06-30 MED ORDER — EPOETIN ALFA 20000 UNIT/ML IJ SOLN
INTRAMUSCULAR | Status: AC
  Filled 2014-06-30: qty 1

## 2014-06-30 MED ORDER — EPOETIN ALFA 20000 UNIT/ML IJ SOLN
20000.0000 [IU] | INTRAMUSCULAR | Status: DC
  Administered 2014-06-30: 20000 [IU] via SUBCUTANEOUS

## 2014-07-07 ENCOUNTER — Encounter (HOSPITAL_COMMUNITY)
Admission: RE | Admit: 2014-07-07 | Discharge: 2014-07-07 | Disposition: A | Discharge status: Home / Self Care | Payer: PRIVATE HEALTH INSURANCE | Source: Ambulatory Visit | Attending: Nephrology | Admitting: Nephrology

## 2014-07-07 DIAGNOSIS — D631 Anemia in chronic kidney disease: Secondary | ICD-10-CM | POA: Diagnosis not present

## 2014-07-07 MED ORDER — EPOETIN ALFA 20000 UNIT/ML IJ SOLN
INTRAMUSCULAR | Status: AC
  Filled 2014-07-07: qty 1

## 2014-07-07 MED ORDER — EPOETIN ALFA 20000 UNIT/ML IJ SOLN
20000.0000 [IU] | INTRAMUSCULAR | Status: DC
  Administered 2014-07-07: 20000 [IU] via SUBCUTANEOUS

## 2014-07-08 LAB — POCT HEMOGLOBIN-HEMACUE: Hemoglobin: 10.4 g/dL — ABNORMAL LOW (ref 12.0–15.0)

## 2014-07-14 ENCOUNTER — Encounter (HOSPITAL_COMMUNITY)
Admission: RE | Admit: 2014-07-14 | Discharge: 2014-07-14 | Disposition: A | Discharge status: Home / Self Care | Payer: PRIVATE HEALTH INSURANCE | Source: Ambulatory Visit | Attending: Nephrology | Admitting: Nephrology

## 2014-07-14 DIAGNOSIS — D631 Anemia in chronic kidney disease: Secondary | ICD-10-CM | POA: Diagnosis not present

## 2014-07-14 DIAGNOSIS — N183 Chronic kidney disease, stage 3 (moderate): Secondary | ICD-10-CM | POA: Diagnosis not present

## 2014-07-14 LAB — POCT HEMOGLOBIN-HEMACUE: Hemoglobin: 10.4 g/dL — ABNORMAL LOW (ref 12.0–15.0)

## 2014-07-14 MED ORDER — EPOETIN ALFA 20000 UNIT/ML IJ SOLN
INTRAMUSCULAR | Status: AC
  Filled 2014-07-14: qty 1

## 2014-07-14 MED ORDER — EPOETIN ALFA 20000 UNIT/ML IJ SOLN
20000.0000 [IU] | INTRAMUSCULAR | Status: DC
  Administered 2014-07-14: 20000 [IU] via SUBCUTANEOUS

## 2014-07-21 ENCOUNTER — Encounter (HOSPITAL_COMMUNITY)
Admission: RE | Admit: 2014-07-21 | Discharge: 2014-07-21 | Disposition: A | Discharge status: Home / Self Care | Payer: PRIVATE HEALTH INSURANCE | Source: Ambulatory Visit | Attending: Nephrology | Admitting: Nephrology

## 2014-07-21 DIAGNOSIS — D631 Anemia in chronic kidney disease: Secondary | ICD-10-CM | POA: Diagnosis not present

## 2014-07-21 LAB — POCT HEMOGLOBIN-HEMACUE: Hemoglobin: 10.4 g/dL — ABNORMAL LOW (ref 12.0–15.0)

## 2014-07-21 MED ORDER — EPOETIN ALFA 20000 UNIT/ML IJ SOLN
INTRAMUSCULAR | Status: AC
  Administered 2014-07-21: 20000 [IU] via SUBCUTANEOUS
  Filled 2014-07-21: qty 1

## 2014-07-21 MED ORDER — EPOETIN ALFA 20000 UNIT/ML IJ SOLN: 20000.0000 [IU] | INTRAMUSCULAR | Status: DC

## 2014-07-28 ENCOUNTER — Encounter (HOSPITAL_COMMUNITY)
Admission: RE | Admit: 2014-07-28 | Discharge: 2014-07-28 | Disposition: A | Discharge status: Home / Self Care | Payer: PRIVATE HEALTH INSURANCE | Source: Ambulatory Visit | Attending: Nephrology | Admitting: Nephrology

## 2014-07-28 DIAGNOSIS — D631 Anemia in chronic kidney disease: Secondary | ICD-10-CM | POA: Diagnosis not present

## 2014-07-28 LAB — RENAL FUNCTION PANEL
Albumin: 3.3 g/dL — ABNORMAL LOW (ref 3.5–5.2)
Anion gap: 15 (ref 5–15)
BUN: 74 mg/dL — AB (ref 6–23)
CO2: 21 mEq/L (ref 19–32)
Calcium: 9.6 mg/dL (ref 8.4–10.5)
Chloride: 105 mEq/L (ref 96–112)
Creatinine, Ser: 3.45 mg/dL — ABNORMAL HIGH (ref 0.50–1.10)
GFR calc Af Amer: 13 mL/min — ABNORMAL LOW (ref >90)
GFR calc non Af Amer: 12 mL/min — ABNORMAL LOW (ref >90)
GLUCOSE: 124 mg/dL — AB (ref 70–99)
PHOSPHORUS: 3.4 mg/dL (ref 2.3–4.6)
Potassium: 5.5 mEq/L — ABNORMAL HIGH (ref 3.7–5.3)
Sodium: 141 mEq/L (ref 137–147)

## 2014-07-28 LAB — IRON AND TIBC
IRON: 30 ug/dL — AB (ref 42–135)
Saturation Ratios: 16 % — ABNORMAL LOW (ref 20–55)
TIBC: 183 ug/dL — ABNORMAL LOW (ref 250–470)
UIBC: 153 ug/dL (ref 125–400)

## 2014-07-28 LAB — FERRITIN: Ferritin: 745 ng/mL — ABNORMAL HIGH (ref 10–291)

## 2014-07-28 LAB — POCT HEMOGLOBIN-HEMACUE: Hemoglobin: 10 g/dL — ABNORMAL LOW (ref 12.0–15.0)

## 2014-07-28 MED ORDER — EPOETIN ALFA 20000 UNIT/ML IJ SOLN
INTRAMUSCULAR | Status: AC
  Filled 2014-07-28: qty 1

## 2014-07-28 MED ORDER — EPOETIN ALFA 20000 UNIT/ML IJ SOLN
20000.0000 [IU] | INTRAMUSCULAR | Status: DC
  Administered 2014-07-28: 20000 [IU] via SUBCUTANEOUS

## 2014-08-03 ENCOUNTER — Encounter (HOSPITAL_COMMUNITY): Payer: PRIVATE HEALTH INSURANCE

## 2014-08-10 ENCOUNTER — Encounter (HOSPITAL_COMMUNITY)
Admission: RE | Admit: 2014-08-10 | Discharge: 2014-08-10 | Disposition: A | Discharge status: Home / Self Care | Payer: PRIVATE HEALTH INSURANCE | Source: Ambulatory Visit | Attending: Nephrology | Admitting: Nephrology

## 2014-08-10 DIAGNOSIS — N183 Chronic kidney disease, stage 3 (moderate): Secondary | ICD-10-CM | POA: Insufficient documentation

## 2014-08-10 DIAGNOSIS — D631 Anemia in chronic kidney disease: Secondary | ICD-10-CM | POA: Diagnosis not present

## 2014-08-10 LAB — POCT HEMOGLOBIN-HEMACUE: Hemoglobin: 10.7 g/dL — ABNORMAL LOW (ref 12.0–15.0)

## 2014-08-10 MED ORDER — EPOETIN ALFA 20000 UNIT/ML IJ SOLN
INTRAMUSCULAR | Status: AC
  Administered 2014-08-10: 20000 [IU] via SUBCUTANEOUS
  Filled 2014-08-10: qty 1

## 2014-08-10 MED ORDER — EPOETIN ALFA 20000 UNIT/ML IJ SOLN
20000.0000 [IU] | INTRAMUSCULAR | Status: DC
  Administered 2014-08-10: 20000 [IU] via SUBCUTANEOUS

## 2014-08-11 ENCOUNTER — Encounter (HOSPITAL_COMMUNITY): Payer: PRIVATE HEALTH INSURANCE

## 2014-08-17 ENCOUNTER — Encounter (HOSPITAL_COMMUNITY): Payer: PRIVATE HEALTH INSURANCE

## 2014-08-20 ENCOUNTER — Other Ambulatory Visit (HOSPITAL_COMMUNITY): Payer: Self-pay | Admitting: *Deleted

## 2014-08-24 ENCOUNTER — Encounter (HOSPITAL_COMMUNITY)
Admission: RE | Admit: 2014-08-24 | Discharge: 2014-08-24 | Disposition: A | Discharge status: Home / Self Care | Payer: PRIVATE HEALTH INSURANCE | Source: Ambulatory Visit | Attending: Nephrology | Admitting: Nephrology

## 2014-08-24 DIAGNOSIS — D631 Anemia in chronic kidney disease: Secondary | ICD-10-CM | POA: Diagnosis not present

## 2014-08-24 LAB — RENAL FUNCTION PANEL
Albumin: 3.2 g/dL — ABNORMAL LOW (ref 3.5–5.2)
Anion gap: 14 (ref 5–15)
BUN: 79 mg/dL — ABNORMAL HIGH (ref 6–23)
CALCIUM: 9.1 mg/dL (ref 8.4–10.5)
CO2: 21 mEq/L (ref 19–32)
Chloride: 107 mEq/L (ref 96–112)
Creatinine, Ser: 3.39 mg/dL — ABNORMAL HIGH (ref 0.50–1.10)
GFR calc Af Amer: 14 mL/min — ABNORMAL LOW (ref >90)
GFR calc non Af Amer: 12 mL/min — ABNORMAL LOW (ref >90)
GLUCOSE: 132 mg/dL — AB (ref 70–99)
PHOSPHORUS: 4 mg/dL (ref 2.3–4.6)
Potassium: 4.9 mEq/L (ref 3.7–5.3)
SODIUM: 142 meq/L (ref 137–147)

## 2014-08-24 LAB — IRON AND TIBC
Iron: 56 ug/dL (ref 42–135)
Saturation Ratios: 31 % (ref 20–55)
TIBC: 178 ug/dL — ABNORMAL LOW (ref 250–470)
UIBC: 122 ug/dL — ABNORMAL LOW (ref 125–400)

## 2014-08-24 LAB — FERRITIN: Ferritin: 915 ng/mL — ABNORMAL HIGH (ref 10–291)

## 2014-08-24 LAB — POCT HEMOGLOBIN-HEMACUE: HEMOGLOBIN: 9.9 g/dL — AB (ref 12.0–15.0)

## 2014-08-24 MED ORDER — EPOETIN ALFA 20000 UNIT/ML IJ SOLN
INTRAMUSCULAR | Status: AC
  Filled 2014-08-24: qty 1

## 2014-08-24 MED ORDER — EPOETIN ALFA 20000 UNIT/ML IJ SOLN
20000.0000 [IU] | INTRAMUSCULAR | Status: DC
  Administered 2014-08-24: 20000 [IU] via SUBCUTANEOUS

## 2014-08-31 ENCOUNTER — Inpatient Hospital Stay (HOSPITAL_COMMUNITY): Admission: RE | Admit: 2014-08-31 | Payer: PRIVATE HEALTH INSURANCE | Source: Ambulatory Visit

## 2014-09-07 ENCOUNTER — Encounter (HOSPITAL_COMMUNITY)
Admission: RE | Admit: 2014-09-07 | Discharge: 2014-09-07 | Disposition: A | Discharge status: Home / Self Care | Payer: PRIVATE HEALTH INSURANCE | Source: Ambulatory Visit | Attending: Nephrology | Admitting: Nephrology

## 2014-09-07 DIAGNOSIS — D631 Anemia in chronic kidney disease: Secondary | ICD-10-CM | POA: Diagnosis not present

## 2014-09-07 LAB — POCT HEMOGLOBIN-HEMACUE: Hemoglobin: 10.3 g/dL — ABNORMAL LOW (ref 12.0–15.0)

## 2014-09-07 MED ORDER — EPOETIN ALFA 20000 UNIT/ML IJ SOLN
INTRAMUSCULAR | Status: AC
  Administered 2014-09-07: 20000 [IU] via SUBCUTANEOUS
  Filled 2014-09-07: qty 1

## 2014-09-07 MED ORDER — EPOETIN ALFA 20000 UNIT/ML IJ SOLN
20000.0000 [IU] | INTRAMUSCULAR | Status: DC
  Administered 2014-09-07: 20000 [IU] via SUBCUTANEOUS

## 2014-09-13 DIAGNOSIS — E119 Type 2 diabetes mellitus without complications: Secondary | ICD-10-CM | POA: Diagnosis not present

## 2014-09-14 ENCOUNTER — Encounter (HOSPITAL_COMMUNITY): Payer: PRIVATE HEALTH INSURANCE

## 2014-09-14 DIAGNOSIS — E11319 Type 2 diabetes mellitus with unspecified diabetic retinopathy without macular edema: Secondary | ICD-10-CM | POA: Diagnosis not present

## 2014-09-14 DIAGNOSIS — E119 Type 2 diabetes mellitus without complications: Secondary | ICD-10-CM | POA: Diagnosis not present

## 2014-09-14 DIAGNOSIS — E11329 Type 2 diabetes mellitus with mild nonproliferative diabetic retinopathy without macular edema: Secondary | ICD-10-CM | POA: Diagnosis not present

## 2014-09-14 DIAGNOSIS — Z9849 Cataract extraction status, unspecified eye: Secondary | ICD-10-CM | POA: Diagnosis not present

## 2014-09-14 DIAGNOSIS — Z961 Presence of intraocular lens: Secondary | ICD-10-CM | POA: Diagnosis not present

## 2014-09-15 ENCOUNTER — Encounter (HOSPITAL_COMMUNITY)
Admission: RE | Admit: 2014-09-15 | Discharge: 2014-09-15 | Disposition: A | Discharge status: Home / Self Care | Payer: Medicare Other | Source: Ambulatory Visit | Attending: Nephrology | Admitting: Nephrology

## 2014-09-15 DIAGNOSIS — D631 Anemia in chronic kidney disease: Secondary | ICD-10-CM | POA: Diagnosis not present

## 2014-09-15 DIAGNOSIS — N183 Chronic kidney disease, stage 3 (moderate): Secondary | ICD-10-CM | POA: Diagnosis not present

## 2014-09-15 DIAGNOSIS — E119 Type 2 diabetes mellitus without complications: Secondary | ICD-10-CM | POA: Diagnosis not present

## 2014-09-15 DIAGNOSIS — E1121 Type 2 diabetes mellitus with diabetic nephropathy: Secondary | ICD-10-CM | POA: Diagnosis not present

## 2014-09-15 LAB — POCT HEMOGLOBIN-HEMACUE: Hemoglobin: 10.4 g/dL — ABNORMAL LOW (ref 12.0–15.0)

## 2014-09-15 MED ORDER — EPOETIN ALFA 20000 UNIT/ML IJ SOLN
20000.0000 [IU] | INTRAMUSCULAR | Status: DC
  Administered 2014-09-15: 20000 [IU] via SUBCUTANEOUS

## 2014-09-15 MED ORDER — EPOETIN ALFA 20000 UNIT/ML IJ SOLN
INTRAMUSCULAR | Status: AC
  Filled 2014-09-15: qty 1

## 2014-09-16 DIAGNOSIS — E119 Type 2 diabetes mellitus without complications: Secondary | ICD-10-CM | POA: Diagnosis not present

## 2014-09-17 DIAGNOSIS — E119 Type 2 diabetes mellitus without complications: Secondary | ICD-10-CM | POA: Diagnosis not present

## 2014-09-20 DIAGNOSIS — E119 Type 2 diabetes mellitus without complications: Secondary | ICD-10-CM | POA: Diagnosis not present

## 2014-09-21 DIAGNOSIS — E119 Type 2 diabetes mellitus without complications: Secondary | ICD-10-CM | POA: Diagnosis not present

## 2014-09-22 ENCOUNTER — Encounter (HOSPITAL_COMMUNITY)
Admission: RE | Admit: 2014-09-22 | Discharge: 2014-09-22 | Disposition: A | Discharge status: Home / Self Care | Payer: Medicare Other | Source: Ambulatory Visit | Attending: Nephrology | Admitting: Nephrology

## 2014-09-22 DIAGNOSIS — D631 Anemia in chronic kidney disease: Secondary | ICD-10-CM | POA: Diagnosis not present

## 2014-09-22 DIAGNOSIS — E119 Type 2 diabetes mellitus without complications: Secondary | ICD-10-CM | POA: Diagnosis not present

## 2014-09-22 DIAGNOSIS — N183 Chronic kidney disease, stage 3 (moderate): Secondary | ICD-10-CM | POA: Diagnosis not present

## 2014-09-22 LAB — RENAL FUNCTION PANEL
ALBUMIN: 3.3 g/dL — AB (ref 3.5–5.2)
Anion gap: 12 (ref 5–15)
BUN: 75 mg/dL — AB (ref 6–23)
CALCIUM: 8.8 mg/dL (ref 8.4–10.5)
CHLORIDE: 105 meq/L (ref 96–112)
CO2: 21 mmol/L (ref 19–32)
CREATININE: 3.6 mg/dL — AB (ref 0.50–1.10)
GFR calc Af Amer: 13 mL/min — ABNORMAL LOW (ref >90)
GFR calc non Af Amer: 11 mL/min — ABNORMAL LOW (ref >90)
Glucose, Bld: 147 mg/dL — ABNORMAL HIGH (ref 70–99)
Phosphorus: 4.7 mg/dL — ABNORMAL HIGH (ref 2.3–4.6)
Potassium: 4.9 mmol/L (ref 3.5–5.1)
Sodium: 138 mmol/L (ref 135–145)

## 2014-09-22 LAB — FERRITIN: Ferritin: 734 ng/mL — ABNORMAL HIGH (ref 10–291)

## 2014-09-22 LAB — IRON AND TIBC
IRON: 23 ug/dL — AB (ref 42–145)
SATURATION RATIOS: 14 % — AB (ref 20–55)
TIBC: 170 ug/dL — ABNORMAL LOW (ref 250–470)
UIBC: 147 ug/dL (ref 125–400)

## 2014-09-22 LAB — POCT HEMOGLOBIN-HEMACUE: HEMOGLOBIN: 11 g/dL — AB (ref 12.0–15.0)

## 2014-09-22 MED ORDER — EPOETIN ALFA 20000 UNIT/ML IJ SOLN: 20000.0000 [IU] | INTRAMUSCULAR | Status: DC

## 2014-09-23 DIAGNOSIS — E119 Type 2 diabetes mellitus without complications: Secondary | ICD-10-CM | POA: Diagnosis not present

## 2014-09-24 DIAGNOSIS — E119 Type 2 diabetes mellitus without complications: Secondary | ICD-10-CM | POA: Diagnosis not present

## 2014-09-27 DIAGNOSIS — E119 Type 2 diabetes mellitus without complications: Secondary | ICD-10-CM | POA: Diagnosis not present

## 2014-09-28 DIAGNOSIS — E119 Type 2 diabetes mellitus without complications: Secondary | ICD-10-CM | POA: Diagnosis not present

## 2014-09-29 DIAGNOSIS — E119 Type 2 diabetes mellitus without complications: Secondary | ICD-10-CM | POA: Diagnosis not present

## 2014-09-30 DIAGNOSIS — E119 Type 2 diabetes mellitus without complications: Secondary | ICD-10-CM | POA: Diagnosis not present

## 2014-10-05 DIAGNOSIS — E119 Type 2 diabetes mellitus without complications: Secondary | ICD-10-CM | POA: Diagnosis not present

## 2014-10-06 ENCOUNTER — Encounter (HOSPITAL_COMMUNITY)
Admission: RE | Admit: 2014-10-06 | Discharge: 2014-10-06 | Disposition: A | Discharge status: Home / Self Care | Payer: Medicare Other | Source: Ambulatory Visit | Attending: Nephrology | Admitting: Nephrology

## 2014-10-06 DIAGNOSIS — N183 Chronic kidney disease, stage 3 (moderate): Secondary | ICD-10-CM | POA: Diagnosis not present

## 2014-10-06 DIAGNOSIS — E119 Type 2 diabetes mellitus without complications: Secondary | ICD-10-CM | POA: Diagnosis not present

## 2014-10-06 DIAGNOSIS — D631 Anemia in chronic kidney disease: Secondary | ICD-10-CM | POA: Diagnosis not present

## 2014-10-06 LAB — POCT HEMOGLOBIN-HEMACUE: Hemoglobin: 10.8 g/dL — ABNORMAL LOW (ref 12.0–15.0)

## 2014-10-06 MED ORDER — EPOETIN ALFA 20000 UNIT/ML IJ SOLN
INTRAMUSCULAR | Status: AC
  Administered 2014-10-06: 20000 [IU] via SUBCUTANEOUS
  Filled 2014-10-06: qty 1

## 2014-10-06 MED ORDER — EPOETIN ALFA 20000 UNIT/ML IJ SOLN
20000.0000 [IU] | INTRAMUSCULAR | Status: DC
  Administered 2014-10-06: 20000 [IU] via SUBCUTANEOUS

## 2014-10-07 DIAGNOSIS — Z6835 Body mass index (BMI) 35.0-35.9, adult: Secondary | ICD-10-CM | POA: Diagnosis not present

## 2014-10-07 DIAGNOSIS — I10 Essential (primary) hypertension: Secondary | ICD-10-CM | POA: Diagnosis not present

## 2014-10-07 DIAGNOSIS — E119 Type 2 diabetes mellitus without complications: Secondary | ICD-10-CM | POA: Diagnosis not present

## 2014-10-07 DIAGNOSIS — J329 Chronic sinusitis, unspecified: Secondary | ICD-10-CM | POA: Diagnosis not present

## 2014-10-07 DIAGNOSIS — E782 Mixed hyperlipidemia: Secondary | ICD-10-CM | POA: Diagnosis not present

## 2014-10-08 DIAGNOSIS — E119 Type 2 diabetes mellitus without complications: Secondary | ICD-10-CM | POA: Diagnosis not present

## 2014-10-11 DIAGNOSIS — E119 Type 2 diabetes mellitus without complications: Secondary | ICD-10-CM | POA: Diagnosis not present

## 2014-10-12 DIAGNOSIS — E119 Type 2 diabetes mellitus without complications: Secondary | ICD-10-CM | POA: Diagnosis not present

## 2014-10-13 ENCOUNTER — Encounter (HOSPITAL_COMMUNITY)
Admission: RE | Admit: 2014-10-13 | Discharge: 2014-10-13 | Disposition: A | Discharge status: Home / Self Care | Payer: Medicare Other | Source: Ambulatory Visit | Attending: Nephrology | Admitting: Nephrology

## 2014-10-13 DIAGNOSIS — D631 Anemia in chronic kidney disease: Secondary | ICD-10-CM | POA: Diagnosis not present

## 2014-10-13 DIAGNOSIS — E119 Type 2 diabetes mellitus without complications: Secondary | ICD-10-CM | POA: Diagnosis not present

## 2014-10-13 DIAGNOSIS — N183 Chronic kidney disease, stage 3 (moderate): Secondary | ICD-10-CM | POA: Diagnosis not present

## 2014-10-13 LAB — POCT HEMOGLOBIN-HEMACUE: Hemoglobin: 10.1 g/dL — ABNORMAL LOW (ref 12.0–15.0)

## 2014-10-13 MED ORDER — EPOETIN ALFA 20000 UNIT/ML IJ SOLN
INTRAMUSCULAR | Status: AC
  Administered 2014-10-13: 20000 [IU] via SUBCUTANEOUS
  Filled 2014-10-13: qty 1

## 2014-10-13 MED ORDER — EPOETIN ALFA 20000 UNIT/ML IJ SOLN
20000.0000 [IU] | INTRAMUSCULAR | Status: DC
  Administered 2014-10-13: 20000 [IU] via SUBCUTANEOUS

## 2014-10-14 DIAGNOSIS — E119 Type 2 diabetes mellitus without complications: Secondary | ICD-10-CM | POA: Diagnosis not present

## 2014-10-15 DIAGNOSIS — Z6835 Body mass index (BMI) 35.0-35.9, adult: Secondary | ICD-10-CM | POA: Diagnosis not present

## 2014-10-15 DIAGNOSIS — E6609 Other obesity due to excess calories: Secondary | ICD-10-CM | POA: Diagnosis not present

## 2014-10-15 DIAGNOSIS — E1121 Type 2 diabetes mellitus with diabetic nephropathy: Secondary | ICD-10-CM | POA: Diagnosis not present

## 2014-10-15 DIAGNOSIS — E039 Hypothyroidism, unspecified: Secondary | ICD-10-CM | POA: Diagnosis not present

## 2014-10-15 DIAGNOSIS — E782 Mixed hyperlipidemia: Secondary | ICD-10-CM | POA: Diagnosis not present

## 2014-10-15 DIAGNOSIS — E119 Type 2 diabetes mellitus without complications: Secondary | ICD-10-CM | POA: Diagnosis not present

## 2014-10-18 DIAGNOSIS — E119 Type 2 diabetes mellitus without complications: Secondary | ICD-10-CM | POA: Diagnosis not present

## 2014-10-19 DIAGNOSIS — E119 Type 2 diabetes mellitus without complications: Secondary | ICD-10-CM | POA: Diagnosis not present

## 2014-10-20 ENCOUNTER — Encounter (HOSPITAL_COMMUNITY)
Admission: RE | Admit: 2014-10-20 | Discharge: 2014-10-20 | Disposition: A | Discharge status: Home / Self Care | Payer: Medicare Other | Source: Ambulatory Visit | Attending: Nephrology | Admitting: Nephrology

## 2014-10-20 DIAGNOSIS — N183 Chronic kidney disease, stage 3 (moderate): Secondary | ICD-10-CM | POA: Diagnosis not present

## 2014-10-20 DIAGNOSIS — E119 Type 2 diabetes mellitus without complications: Secondary | ICD-10-CM | POA: Diagnosis not present

## 2014-10-20 DIAGNOSIS — D631 Anemia in chronic kidney disease: Secondary | ICD-10-CM | POA: Diagnosis not present

## 2014-10-20 LAB — RENAL FUNCTION PANEL
Albumin: 3.1 g/dL — ABNORMAL LOW (ref 3.5–5.2)
Anion gap: 9 (ref 5–15)
BUN: 73 mg/dL — ABNORMAL HIGH (ref 6–23)
CALCIUM: 8.8 mg/dL (ref 8.4–10.5)
CHLORIDE: 109 mmol/L (ref 96–112)
CO2: 23 mmol/L (ref 19–32)
Creatinine, Ser: 3.52 mg/dL — ABNORMAL HIGH (ref 0.50–1.10)
GFR calc non Af Amer: 11 mL/min — ABNORMAL LOW (ref >90)
GFR, EST AFRICAN AMERICAN: 13 mL/min — AB (ref >90)
GLUCOSE: 178 mg/dL — AB (ref 70–99)
Phosphorus: 3.8 mg/dL (ref 2.3–4.6)
Potassium: 4.7 mmol/L (ref 3.5–5.1)
Sodium: 141 mmol/L (ref 135–145)

## 2014-10-20 LAB — POCT HEMOGLOBIN-HEMACUE: Hemoglobin: 9.8 g/dL — ABNORMAL LOW (ref 12.0–15.0)

## 2014-10-20 MED ORDER — EPOETIN ALFA 20000 UNIT/ML IJ SOLN
20000.0000 [IU] | INTRAMUSCULAR | Status: DC
  Administered 2014-10-20: 20000 [IU] via SUBCUTANEOUS

## 2014-10-21 DIAGNOSIS — E119 Type 2 diabetes mellitus without complications: Secondary | ICD-10-CM | POA: Diagnosis not present

## 2014-10-21 LAB — IRON AND TIBC
Iron: 23 ug/dL — ABNORMAL LOW (ref 42–145)
Saturation Ratios: 13 % — ABNORMAL LOW (ref 20–55)
TIBC: 172 ug/dL — AB (ref 250–470)
UIBC: 149 ug/dL (ref 125–400)

## 2014-10-21 LAB — FERRITIN: Ferritin: 734 ng/mL — ABNORMAL HIGH (ref 10–291)

## 2014-10-21 MED ORDER — EPOETIN ALFA 20000 UNIT/ML IJ SOLN
INTRAMUSCULAR | Status: AC
  Filled 2014-10-21: qty 1

## 2014-10-25 DIAGNOSIS — E119 Type 2 diabetes mellitus without complications: Secondary | ICD-10-CM | POA: Diagnosis not present

## 2014-10-26 DIAGNOSIS — E119 Type 2 diabetes mellitus without complications: Secondary | ICD-10-CM | POA: Diagnosis not present

## 2014-10-27 ENCOUNTER — Encounter (HOSPITAL_COMMUNITY)
Admission: RE | Admit: 2014-10-27 | Discharge: 2014-10-27 | Disposition: A | Discharge status: Home / Self Care | Payer: Medicare Other | Source: Ambulatory Visit | Attending: Nephrology | Admitting: Nephrology

## 2014-10-27 DIAGNOSIS — N183 Chronic kidney disease, stage 3 (moderate): Secondary | ICD-10-CM | POA: Diagnosis not present

## 2014-10-27 DIAGNOSIS — D631 Anemia in chronic kidney disease: Secondary | ICD-10-CM | POA: Diagnosis not present

## 2014-10-27 DIAGNOSIS — E119 Type 2 diabetes mellitus without complications: Secondary | ICD-10-CM | POA: Diagnosis not present

## 2014-10-27 LAB — POCT HEMOGLOBIN-HEMACUE: Hemoglobin: 10.2 g/dL — ABNORMAL LOW (ref 12.0–15.0)

## 2014-10-27 MED ORDER — EPOETIN ALFA 20000 UNIT/ML IJ SOLN
20000.0000 [IU] | INTRAMUSCULAR | Status: DC
  Administered 2014-10-27: 20000 [IU] via SUBCUTANEOUS

## 2014-10-28 DIAGNOSIS — E119 Type 2 diabetes mellitus without complications: Secondary | ICD-10-CM | POA: Diagnosis not present

## 2014-10-28 MED ORDER — EPOETIN ALFA 20000 UNIT/ML IJ SOLN
INTRAMUSCULAR | Status: AC
  Filled 2014-10-28: qty 1

## 2014-10-29 DIAGNOSIS — E119 Type 2 diabetes mellitus without complications: Secondary | ICD-10-CM | POA: Diagnosis not present

## 2014-11-01 DIAGNOSIS — E119 Type 2 diabetes mellitus without complications: Secondary | ICD-10-CM | POA: Diagnosis not present

## 2014-11-02 DIAGNOSIS — E119 Type 2 diabetes mellitus without complications: Secondary | ICD-10-CM | POA: Diagnosis not present

## 2014-11-03 ENCOUNTER — Encounter (HOSPITAL_COMMUNITY)
Admission: RE | Admit: 2014-11-03 | Discharge: 2014-11-03 | Disposition: A | Discharge status: Home / Self Care | Payer: Medicare Other | Source: Ambulatory Visit | Attending: Nephrology | Admitting: Nephrology

## 2014-11-03 ENCOUNTER — Other Ambulatory Visit (HOSPITAL_COMMUNITY): Payer: Self-pay | Admitting: *Deleted

## 2014-11-03 DIAGNOSIS — E119 Type 2 diabetes mellitus without complications: Secondary | ICD-10-CM | POA: Diagnosis not present

## 2014-11-03 DIAGNOSIS — D631 Anemia in chronic kidney disease: Secondary | ICD-10-CM | POA: Diagnosis not present

## 2014-11-03 DIAGNOSIS — N183 Chronic kidney disease, stage 3 (moderate): Secondary | ICD-10-CM | POA: Diagnosis not present

## 2014-11-03 LAB — POCT HEMOGLOBIN-HEMACUE: Hemoglobin: 10.3 g/dL — ABNORMAL LOW (ref 12.0–15.0)

## 2014-11-03 MED ORDER — FERUMOXYTOL INJECTION 510 MG/17 ML
510.0000 mg | INTRAVENOUS | Status: DC
Start: 2014-11-03 — End: 2014-11-04
  Administered 2014-11-03: 510 mg via INTRAVENOUS
  Filled 2014-11-03: qty 17

## 2014-11-03 MED ORDER — EPOETIN ALFA 20000 UNIT/ML IJ SOLN
20000.0000 [IU] | INTRAMUSCULAR | Status: DC
  Administered 2014-11-03: 20000 [IU] via SUBCUTANEOUS

## 2014-11-04 DIAGNOSIS — E119 Type 2 diabetes mellitus without complications: Secondary | ICD-10-CM | POA: Diagnosis not present

## 2014-11-04 MED ORDER — EPOETIN ALFA 20000 UNIT/ML IJ SOLN
INTRAMUSCULAR | Status: AC
  Administered 2014-11-03: 20000 [IU] via SUBCUTANEOUS
  Filled 2014-11-04: qty 1

## 2014-11-05 DIAGNOSIS — E119 Type 2 diabetes mellitus without complications: Secondary | ICD-10-CM | POA: Diagnosis not present

## 2014-11-08 DIAGNOSIS — E119 Type 2 diabetes mellitus without complications: Secondary | ICD-10-CM | POA: Diagnosis not present

## 2014-11-09 ENCOUNTER — Ambulatory Visit (INDEPENDENT_AMBULATORY_CARE_PROVIDER_SITE_OTHER): Payer: Medicare Other | Admitting: Obstetrics & Gynecology

## 2014-11-09 ENCOUNTER — Ambulatory Visit: Payer: Medicaid Other | Admitting: Obstetrics & Gynecology

## 2014-11-09 ENCOUNTER — Encounter: Payer: Self-pay | Admitting: Obstetrics & Gynecology

## 2014-11-09 ENCOUNTER — Other Ambulatory Visit (HOSPITAL_COMMUNITY): Payer: Self-pay | Admitting: *Deleted

## 2014-11-09 VITALS — BP 130/60 | HR 62 | Wt 222.0 lb

## 2014-11-09 DIAGNOSIS — N813 Complete uterovaginal prolapse: Secondary | ICD-10-CM

## 2014-11-09 DIAGNOSIS — E119 Type 2 diabetes mellitus without complications: Secondary | ICD-10-CM | POA: Diagnosis not present

## 2014-11-09 NOTE — Progress Notes (Signed)
Patient ID: [name], female   DOB: Jun 09, 1932, 81 y.o.   MRN: [id number]      [name] presents today for routine follow up related to her pessary.   She uses a Gelhorn #4 She reports no vaginal discharge or vaginal bleeding.  Blood pressure 130/60, pulse 62, weight 222 lb (100.699 kg).   Exam reveals no undue vaginal mucosal pressure of breakdown, little discharge and no vaginal bleeding.  The pessary is left in place since it is a [place] will be sen back in 6 months for continued follow up. Of her complete procidentia and management with a pessary  Lason Eveland H 11/09/2014 12:01 PM

## 2014-11-10 ENCOUNTER — Encounter (HOSPITAL_COMMUNITY)
Admission: RE | Admit: 2014-11-10 | Discharge: 2014-11-10 | Disposition: A | Discharge status: Home / Self Care | Payer: Medicare Other | Source: Ambulatory Visit | Attending: Nephrology | Admitting: Nephrology

## 2014-11-10 DIAGNOSIS — N183 Chronic kidney disease, stage 3 (moderate): Secondary | ICD-10-CM | POA: Insufficient documentation

## 2014-11-10 DIAGNOSIS — D631 Anemia in chronic kidney disease: Secondary | ICD-10-CM | POA: Insufficient documentation

## 2014-11-10 DIAGNOSIS — E119 Type 2 diabetes mellitus without complications: Secondary | ICD-10-CM | POA: Diagnosis not present

## 2014-11-10 LAB — POCT HEMOGLOBIN-HEMACUE: Hemoglobin: 9.9 g/dL — ABNORMAL LOW (ref 12.0–15.0)

## 2014-11-10 MED ORDER — SODIUM CHLORIDE 0.9 % IV SOLN
510.0000 mg | INTRAVENOUS | Status: DC
  Administered 2014-11-10: 510 mg via INTRAVENOUS
  Filled 2014-11-10: qty 17

## 2014-11-10 MED ORDER — EPOETIN ALFA 20000 UNIT/ML IJ SOLN
20000.0000 [IU] | INTRAMUSCULAR | Status: DC
  Administered 2014-11-10: 20000 [IU] via SUBCUTANEOUS

## 2014-11-11 DIAGNOSIS — E119 Type 2 diabetes mellitus without complications: Secondary | ICD-10-CM | POA: Diagnosis not present

## 2014-11-11 MED ORDER — EPOETIN ALFA 20000 UNIT/ML IJ SOLN
INTRAMUSCULAR | Status: AC
  Filled 2014-11-11: qty 1

## 2014-11-12 DIAGNOSIS — E119 Type 2 diabetes mellitus without complications: Secondary | ICD-10-CM | POA: Diagnosis not present

## 2014-11-15 DIAGNOSIS — E119 Type 2 diabetes mellitus without complications: Secondary | ICD-10-CM | POA: Diagnosis not present

## 2014-11-16 ENCOUNTER — Other Ambulatory Visit (HOSPITAL_COMMUNITY): Payer: Self-pay | Admitting: *Deleted

## 2014-11-16 DIAGNOSIS — E119 Type 2 diabetes mellitus without complications: Secondary | ICD-10-CM | POA: Diagnosis not present

## 2014-11-17 ENCOUNTER — Encounter (HOSPITAL_COMMUNITY)
Admission: RE | Admit: 2014-11-17 | Discharge: 2014-11-17 | Disposition: A | Discharge status: Home / Self Care | Payer: Medicare Other | Source: Ambulatory Visit | Attending: Nephrology | Admitting: Nephrology

## 2014-11-17 DIAGNOSIS — N183 Chronic kidney disease, stage 3 (moderate): Secondary | ICD-10-CM | POA: Diagnosis not present

## 2014-11-17 DIAGNOSIS — D631 Anemia in chronic kidney disease: Secondary | ICD-10-CM | POA: Diagnosis not present

## 2014-11-17 DIAGNOSIS — E119 Type 2 diabetes mellitus without complications: Secondary | ICD-10-CM | POA: Diagnosis not present

## 2014-11-17 LAB — RENAL FUNCTION PANEL
Albumin: 3.1 g/dL — ABNORMAL LOW (ref 3.5–5.2)
Anion gap: 7 (ref 5–15)
BUN: 64 mg/dL — AB (ref 6–23)
CHLORIDE: 107 mmol/L (ref 96–112)
CO2: 25 mmol/L (ref 19–32)
Calcium: 8.8 mg/dL (ref 8.4–10.5)
Creatinine, Ser: 3.28 mg/dL — ABNORMAL HIGH (ref 0.50–1.10)
GFR calc non Af Amer: 12 mL/min — ABNORMAL LOW (ref >90)
GFR, EST AFRICAN AMERICAN: 14 mL/min — AB (ref >90)
GLUCOSE: 118 mg/dL — AB (ref 70–99)
PHOSPHORUS: 4.3 mg/dL (ref 2.3–4.6)
POTASSIUM: 5.1 mmol/L (ref 3.5–5.1)
SODIUM: 139 mmol/L (ref 135–145)

## 2014-11-17 LAB — POCT HEMOGLOBIN-HEMACUE: Hemoglobin: 10.5 g/dL — ABNORMAL LOW (ref 12.0–15.0)

## 2014-11-17 MED ORDER — EPOETIN ALFA 20000 UNIT/ML IJ SOLN
20000.0000 [IU] | INTRAMUSCULAR | Status: DC
  Administered 2014-11-17: 20000 [IU] via SUBCUTANEOUS

## 2014-11-18 DIAGNOSIS — E119 Type 2 diabetes mellitus without complications: Secondary | ICD-10-CM | POA: Diagnosis not present

## 2014-11-18 LAB — FERRITIN: FERRITIN: 1207 ng/mL — AB (ref 10–291)

## 2014-11-18 LAB — IRON AND TIBC
IRON: 42 ug/dL (ref 42–145)
Saturation Ratios: 24 % (ref 20–55)
TIBC: 172 ug/dL — ABNORMAL LOW (ref 250–470)
UIBC: 130 ug/dL (ref 125–400)

## 2014-11-18 MED ORDER — EPOETIN ALFA 20000 UNIT/ML IJ SOLN
INTRAMUSCULAR | Status: AC
  Filled 2014-11-18: qty 1

## 2014-11-19 DIAGNOSIS — E119 Type 2 diabetes mellitus without complications: Secondary | ICD-10-CM | POA: Diagnosis not present

## 2014-11-22 DIAGNOSIS — E119 Type 2 diabetes mellitus without complications: Secondary | ICD-10-CM | POA: Diagnosis not present

## 2014-11-23 DIAGNOSIS — E119 Type 2 diabetes mellitus without complications: Secondary | ICD-10-CM | POA: Diagnosis not present

## 2014-11-24 ENCOUNTER — Encounter (HOSPITAL_COMMUNITY)
Admission: RE | Admit: 2014-11-24 | Discharge: 2014-11-24 | Disposition: A | Discharge status: Home / Self Care | Payer: Medicare Other | Source: Ambulatory Visit | Attending: Nephrology | Admitting: Nephrology

## 2014-11-24 DIAGNOSIS — E119 Type 2 diabetes mellitus without complications: Secondary | ICD-10-CM | POA: Diagnosis not present

## 2014-11-24 DIAGNOSIS — D631 Anemia in chronic kidney disease: Secondary | ICD-10-CM | POA: Diagnosis not present

## 2014-11-24 DIAGNOSIS — N183 Chronic kidney disease, stage 3 (moderate): Secondary | ICD-10-CM | POA: Diagnosis not present

## 2014-11-24 LAB — POCT HEMOGLOBIN-HEMACUE: HEMOGLOBIN: 10.9 g/dL — AB (ref 12.0–15.0)

## 2014-11-24 MED ORDER — EPOETIN ALFA 20000 UNIT/ML IJ SOLN
20000.0000 [IU] | INTRAMUSCULAR | Status: DC
  Administered 2014-11-24: 20000 [IU] via SUBCUTANEOUS

## 2014-11-25 DIAGNOSIS — E119 Type 2 diabetes mellitus without complications: Secondary | ICD-10-CM | POA: Diagnosis not present

## 2014-11-25 MED ORDER — EPOETIN ALFA 20000 UNIT/ML IJ SOLN
INTRAMUSCULAR | Status: AC
  Filled 2014-11-25: qty 1

## 2014-11-26 DIAGNOSIS — E119 Type 2 diabetes mellitus without complications: Secondary | ICD-10-CM | POA: Diagnosis not present

## 2014-11-29 DIAGNOSIS — E119 Type 2 diabetes mellitus without complications: Secondary | ICD-10-CM | POA: Diagnosis not present

## 2014-11-30 DIAGNOSIS — E119 Type 2 diabetes mellitus without complications: Secondary | ICD-10-CM | POA: Diagnosis not present

## 2014-12-01 ENCOUNTER — Encounter (HOSPITAL_COMMUNITY)
Admission: RE | Admit: 2014-12-01 | Discharge: 2014-12-01 | Disposition: A | Discharge status: Home / Self Care | Payer: Medicare Other | Source: Ambulatory Visit | Attending: Nephrology | Admitting: Nephrology

## 2014-12-01 DIAGNOSIS — D631 Anemia in chronic kidney disease: Secondary | ICD-10-CM | POA: Diagnosis not present

## 2014-12-01 DIAGNOSIS — E119 Type 2 diabetes mellitus without complications: Secondary | ICD-10-CM | POA: Diagnosis not present

## 2014-12-01 DIAGNOSIS — N183 Chronic kidney disease, stage 3 (moderate): Secondary | ICD-10-CM | POA: Diagnosis not present

## 2014-12-01 LAB — POCT HEMOGLOBIN-HEMACUE: HEMOGLOBIN: 10.9 g/dL — AB (ref 12.0–15.0)

## 2014-12-01 MED ORDER — EPOETIN ALFA 20000 UNIT/ML IJ SOLN
INTRAMUSCULAR | Status: DC
Start: 2014-12-01 — End: 2014-12-02
  Filled 2014-12-01: qty 1

## 2014-12-01 MED ORDER — EPOETIN ALFA 20000 UNIT/ML IJ SOLN
20000.0000 [IU] | INTRAMUSCULAR | Status: DC
  Administered 2014-12-01: 20000 [IU] via SUBCUTANEOUS

## 2014-12-02 DIAGNOSIS — E119 Type 2 diabetes mellitus without complications: Secondary | ICD-10-CM | POA: Diagnosis not present

## 2014-12-03 DIAGNOSIS — E119 Type 2 diabetes mellitus without complications: Secondary | ICD-10-CM | POA: Diagnosis not present

## 2014-12-07 DIAGNOSIS — D631 Anemia in chronic kidney disease: Secondary | ICD-10-CM | POA: Diagnosis not present

## 2014-12-07 DIAGNOSIS — R809 Proteinuria, unspecified: Secondary | ICD-10-CM | POA: Diagnosis not present

## 2014-12-07 DIAGNOSIS — N184 Chronic kidney disease, stage 4 (severe): Secondary | ICD-10-CM | POA: Diagnosis not present

## 2014-12-07 DIAGNOSIS — N2581 Secondary hyperparathyroidism of renal origin: Secondary | ICD-10-CM | POA: Diagnosis not present

## 2014-12-07 DIAGNOSIS — N189 Chronic kidney disease, unspecified: Secondary | ICD-10-CM | POA: Diagnosis not present

## 2014-12-09 ENCOUNTER — Encounter (HOSPITAL_COMMUNITY)
Admission: RE | Admit: 2014-12-09 | Discharge: 2014-12-09 | Disposition: A | Discharge status: Home / Self Care | Payer: Medicare Other | Source: Ambulatory Visit | Attending: Nephrology | Admitting: Nephrology

## 2014-12-09 DIAGNOSIS — D631 Anemia in chronic kidney disease: Secondary | ICD-10-CM | POA: Diagnosis not present

## 2014-12-09 DIAGNOSIS — N183 Chronic kidney disease, stage 3 (moderate): Secondary | ICD-10-CM | POA: Diagnosis not present

## 2014-12-09 DIAGNOSIS — E119 Type 2 diabetes mellitus without complications: Secondary | ICD-10-CM | POA: Diagnosis not present

## 2014-12-09 LAB — POCT HEMOGLOBIN-HEMACUE: HEMOGLOBIN: 11 g/dL — AB (ref 12.0–15.0)

## 2014-12-09 MED ORDER — EPOETIN ALFA 20000 UNIT/ML IJ SOLN: 20000.0000 [IU] | INTRAMUSCULAR | Status: DC

## 2014-12-10 ENCOUNTER — Other Ambulatory Visit: Payer: Self-pay

## 2014-12-10 DIAGNOSIS — N189 Chronic kidney disease, unspecified: Principal | ICD-10-CM

## 2014-12-10 DIAGNOSIS — Z01810 Encounter for preprocedural cardiovascular examination: Secondary | ICD-10-CM

## 2014-12-10 DIAGNOSIS — N185 Chronic kidney disease, stage 5: Secondary | ICD-10-CM

## 2014-12-10 DIAGNOSIS — E119 Type 2 diabetes mellitus without complications: Secondary | ICD-10-CM | POA: Diagnosis not present

## 2014-12-10 DIAGNOSIS — D631 Anemia in chronic kidney disease: Secondary | ICD-10-CM

## 2014-12-13 DIAGNOSIS — E119 Type 2 diabetes mellitus without complications: Secondary | ICD-10-CM | POA: Diagnosis not present

## 2014-12-14 DIAGNOSIS — E119 Type 2 diabetes mellitus without complications: Secondary | ICD-10-CM | POA: Diagnosis not present

## 2014-12-15 DIAGNOSIS — E119 Type 2 diabetes mellitus without complications: Secondary | ICD-10-CM | POA: Diagnosis not present

## 2014-12-16 DIAGNOSIS — E119 Type 2 diabetes mellitus without complications: Secondary | ICD-10-CM | POA: Diagnosis not present

## 2014-12-17 DIAGNOSIS — E119 Type 2 diabetes mellitus without complications: Secondary | ICD-10-CM | POA: Diagnosis not present

## 2014-12-20 DIAGNOSIS — E119 Type 2 diabetes mellitus without complications: Secondary | ICD-10-CM | POA: Diagnosis not present

## 2014-12-21 DIAGNOSIS — E119 Type 2 diabetes mellitus without complications: Secondary | ICD-10-CM | POA: Diagnosis not present

## 2014-12-22 DIAGNOSIS — E119 Type 2 diabetes mellitus without complications: Secondary | ICD-10-CM | POA: Diagnosis not present

## 2014-12-23 ENCOUNTER — Encounter (HOSPITAL_COMMUNITY)
Admission: RE | Admit: 2014-12-23 | Discharge: 2014-12-23 | Disposition: A | Discharge status: Home / Self Care | Payer: Medicare Other | Source: Ambulatory Visit | Attending: Nephrology | Admitting: Nephrology

## 2014-12-23 DIAGNOSIS — Z5181 Encounter for therapeutic drug level monitoring: Secondary | ICD-10-CM | POA: Insufficient documentation

## 2014-12-23 DIAGNOSIS — N183 Chronic kidney disease, stage 3 (moderate): Secondary | ICD-10-CM | POA: Diagnosis not present

## 2014-12-23 DIAGNOSIS — D631 Anemia in chronic kidney disease: Secondary | ICD-10-CM | POA: Insufficient documentation

## 2014-12-23 DIAGNOSIS — Z79899 Other long term (current) drug therapy: Secondary | ICD-10-CM | POA: Insufficient documentation

## 2014-12-23 DIAGNOSIS — E119 Type 2 diabetes mellitus without complications: Secondary | ICD-10-CM | POA: Diagnosis not present

## 2014-12-23 LAB — POCT HEMOGLOBIN-HEMACUE: HEMOGLOBIN: 10.9 g/dL — AB (ref 12.0–15.0)

## 2014-12-23 MED ORDER — EPOETIN ALFA 20000 UNIT/ML IJ SOLN
20000.0000 [IU] | INTRAMUSCULAR | Status: DC
  Administered 2014-12-23: 20000 [IU] via SUBCUTANEOUS

## 2014-12-23 MED ORDER — EPOETIN ALFA 20000 UNIT/ML IJ SOLN
INTRAMUSCULAR | Status: AC
  Filled 2014-12-23: qty 1

## 2014-12-24 DIAGNOSIS — E119 Type 2 diabetes mellitus without complications: Secondary | ICD-10-CM | POA: Diagnosis not present

## 2014-12-24 LAB — IRON AND TIBC
IRON: 116 ug/dL (ref 42–145)
Saturation Ratios: 66 % — ABNORMAL HIGH (ref 20–55)
TIBC: 175 ug/dL — ABNORMAL LOW (ref 250–470)
UIBC: 59 ug/dL — ABNORMAL LOW (ref 125–400)

## 2014-12-24 LAB — FERRITIN: Ferritin: 1414 ng/mL — ABNORMAL HIGH (ref 10–291)

## 2014-12-27 DIAGNOSIS — E119 Type 2 diabetes mellitus without complications: Secondary | ICD-10-CM | POA: Diagnosis not present

## 2014-12-28 DIAGNOSIS — E119 Type 2 diabetes mellitus without complications: Secondary | ICD-10-CM | POA: Diagnosis not present

## 2014-12-29 DIAGNOSIS — E119 Type 2 diabetes mellitus without complications: Secondary | ICD-10-CM | POA: Diagnosis not present

## 2014-12-30 ENCOUNTER — Encounter (HOSPITAL_COMMUNITY)
Admission: RE | Admit: 2014-12-30 | Discharge: 2014-12-30 | Disposition: A | Discharge status: Home / Self Care | Payer: Medicare Other | Source: Ambulatory Visit | Attending: Nephrology | Admitting: Nephrology

## 2014-12-30 DIAGNOSIS — E119 Type 2 diabetes mellitus without complications: Secondary | ICD-10-CM | POA: Diagnosis not present

## 2014-12-30 DIAGNOSIS — Z79899 Other long term (current) drug therapy: Secondary | ICD-10-CM | POA: Diagnosis not present

## 2014-12-30 DIAGNOSIS — D631 Anemia in chronic kidney disease: Secondary | ICD-10-CM | POA: Diagnosis not present

## 2014-12-30 DIAGNOSIS — N183 Chronic kidney disease, stage 3 (moderate): Secondary | ICD-10-CM | POA: Diagnosis not present

## 2014-12-30 DIAGNOSIS — Z5181 Encounter for therapeutic drug level monitoring: Secondary | ICD-10-CM | POA: Diagnosis not present

## 2014-12-30 LAB — POCT HEMOGLOBIN-HEMACUE: Hemoglobin: 10.7 g/dL — ABNORMAL LOW (ref 12.0–15.0)

## 2014-12-30 MED ORDER — EPOETIN ALFA 20000 UNIT/ML IJ SOLN
20000.0000 [IU] | INTRAMUSCULAR | Status: DC
  Administered 2014-12-30: 20000 [IU] via SUBCUTANEOUS

## 2014-12-30 MED ORDER — EPOETIN ALFA 20000 UNIT/ML IJ SOLN
INTRAMUSCULAR | Status: AC
  Filled 2014-12-30: qty 1

## 2014-12-31 DIAGNOSIS — E119 Type 2 diabetes mellitus without complications: Secondary | ICD-10-CM | POA: Diagnosis not present

## 2015-01-06 ENCOUNTER — Encounter: Payer: Self-pay | Admitting: Vascular Surgery

## 2015-01-06 ENCOUNTER — Encounter (HOSPITAL_COMMUNITY)
Admission: RE | Admit: 2015-01-06 | Discharge: 2015-01-06 | Disposition: A | Discharge status: Home / Self Care | Payer: Medicare Other | Source: Ambulatory Visit | Attending: Nephrology | Admitting: Nephrology

## 2015-01-06 DIAGNOSIS — N183 Chronic kidney disease, stage 3 (moderate): Secondary | ICD-10-CM | POA: Diagnosis not present

## 2015-01-06 DIAGNOSIS — Z79899 Other long term (current) drug therapy: Secondary | ICD-10-CM | POA: Diagnosis not present

## 2015-01-06 DIAGNOSIS — Z5181 Encounter for therapeutic drug level monitoring: Secondary | ICD-10-CM | POA: Diagnosis not present

## 2015-01-06 DIAGNOSIS — D631 Anemia in chronic kidney disease: Secondary | ICD-10-CM | POA: Diagnosis not present

## 2015-01-06 LAB — RENAL FUNCTION PANEL
Albumin: 3.2 g/dL — ABNORMAL LOW (ref 3.5–5.2)
Anion gap: 11 (ref 5–15)
BUN: 80 mg/dL — AB (ref 6–23)
CHLORIDE: 107 mmol/L (ref 96–112)
CO2: 22 mmol/L (ref 19–32)
CREATININE: 3.47 mg/dL — AB (ref 0.50–1.10)
Calcium: 9 mg/dL (ref 8.4–10.5)
GFR calc Af Amer: 13 mL/min — ABNORMAL LOW (ref >90)
GFR calc non Af Amer: 11 mL/min — ABNORMAL LOW (ref >90)
Glucose, Bld: 147 mg/dL — ABNORMAL HIGH (ref 70–99)
Phosphorus: 3.8 mg/dL (ref 2.3–4.6)
Potassium: 4.4 mmol/L (ref 3.5–5.1)
Sodium: 140 mmol/L (ref 135–145)

## 2015-01-06 LAB — POCT HEMOGLOBIN-HEMACUE: HEMOGLOBIN: 11.1 g/dL — AB (ref 12.0–15.0)

## 2015-01-06 MED ORDER — EPOETIN ALFA 20000 UNIT/ML IJ SOLN
INTRAMUSCULAR | Status: AC
  Filled 2015-01-06: qty 1

## 2015-01-06 MED ORDER — EPOETIN ALFA 20000 UNIT/ML IJ SOLN: 20000.0000 [IU] | INTRAMUSCULAR | Status: DC

## 2015-01-07 ENCOUNTER — Ambulatory Visit (HOSPITAL_COMMUNITY)
Admission: RE | Admit: 2015-01-07 | Discharge: 2015-01-07 | Disposition: A | Discharge status: Home / Self Care | Payer: Medicare Other | Source: Ambulatory Visit | Attending: Vascular Surgery | Admitting: Vascular Surgery

## 2015-01-07 ENCOUNTER — Encounter: Payer: Self-pay | Admitting: Vascular Surgery

## 2015-01-07 ENCOUNTER — Ambulatory Visit (INDEPENDENT_AMBULATORY_CARE_PROVIDER_SITE_OTHER)
Admission: RE | Admit: 2015-01-07 | Discharge: 2015-01-07 | Disposition: A | Discharge status: Home / Self Care | Payer: Medicare Other | Source: Ambulatory Visit | Attending: Vascular Surgery | Admitting: Vascular Surgery

## 2015-01-07 ENCOUNTER — Ambulatory Visit (INDEPENDENT_AMBULATORY_CARE_PROVIDER_SITE_OTHER): Payer: Medicare Other | Admitting: Vascular Surgery

## 2015-01-07 VITALS — BP 145/79 | HR 70 | Resp 16 | Ht 66.0 in | Wt 220.0 lb

## 2015-01-07 DIAGNOSIS — N189 Chronic kidney disease, unspecified: Secondary | ICD-10-CM

## 2015-01-07 DIAGNOSIS — N185 Chronic kidney disease, stage 5: Secondary | ICD-10-CM

## 2015-01-07 DIAGNOSIS — D631 Anemia in chronic kidney disease: Secondary | ICD-10-CM | POA: Diagnosis not present

## 2015-01-07 DIAGNOSIS — Z01810 Encounter for preprocedural cardiovascular examination: Secondary | ICD-10-CM | POA: Insufficient documentation

## 2015-01-07 DIAGNOSIS — N184 Chronic kidney disease, stage 4 (severe): Secondary | ICD-10-CM

## 2015-01-07 NOTE — Progress Notes (Signed)
Brief History and Physical  History of Present Illness  [name] is a 81 y.o. female who presents with chief complaint: ESRD stage IV CKD.  The patient presents today for evaluation for access creation.  Pt has never had central line placed.  She denies any PCM placement.  She has never had permanent access placed.  She has decided to start HD with she becomes ESRD.  Past medical history includes:   DM, hypertension, and hyperlipidemia.  She takes a daily stain, demadex, and po DM medications.    Past Medical History  Diagnosis Date  . CHF (congestive heart failure)      preserved left ventricular systolic function  . Hypertension   . Overweight(278.02)   . Hypothyroid   . Vaginal pessary present ~ 2012  . CKD (chronic kidney disease) stage 4, GFR 15-29 ml/min     [name] 02/16/2013  . Type II diabetes mellitus   . Anemia associated with chronic renal failure     ESA therapy  . History of blood transfusion 1975  . Rheumatoid arthritis   . Osteoporosis   . Pneumonia 02/2011    [name] 02/20/2011 (02/16/2013)  . Diabetic foot ulcers     "get them on borh feet" (02/16/2013)    Past Surgical History  Procedure Laterality Date  . Cataract extraction w/ intraocular lens  implant, bilateral Bilateral 1990's  . Orif femur fracture Left 1975    "got a rod and screw in it" (02/16/2013)    History   Social History  . Marital Status: Divorced    Spouse Name: N/A  . Number of Children: N/A  . Years of Education: N/A   Occupational History  . Retired - [employer]    Social History Main Topics  . Smoking status: Never Smoker   . Smokeless tobacco: Never Used  . Alcohol Use: No  . Drug Use: No  . Sexual Activity: No   Other Topics Concern  . Not on file   Social History Narrative   Divorced   No regular exercise    Family History  Problem Relation Age of Onset  . Stroke Other   . Heart disease Other   . Kidney disease Other   . Cancer Daughter     cervical  .  Cancer Grandchild     lymph nodes  . Heart disease Mother   . Diabetes Father     Current Outpatient Prescriptions on File Prior to Visit  Medication Sig Dispense Refill  . ALPRAZolam (XANAX) 0.5 MG tablet Take 0.5 mg by mouth every 6 (six) hours as needed for anxiety.     Marland Kitchen amLODipine (NORVASC) 10 MG tablet Take 10 mg by mouth daily.      . calcitRIOL (ROCALTROL) 0.25 MCG capsule Take 0.25 mcg by mouth daily.    . fish oil-omega-3 fatty acids 1000 MG capsule Take 1 capsule by mouth daily.      Marland Kitchen levothyroxine (SYNTHROID, LEVOTHROID) 100 MCG tablet Take 125 mcg by mouth daily.     Marland Kitchen torsemide (DEMADEX) 20 MG tablet Take 20 mg by mouth daily.      Marland Kitchen amoxicillin (AMOXIL) 500 MG capsule Take 500 mg by mouth 2 (two) times daily.    . cephALEXin (KEFLEX) 500 MG capsule Take 500 mg by mouth 2 (two) times daily.    Marland Kitchen doxycycline (VIBRAMYCIN) 50 MG capsule Take 2 capsules (100 mg total) by mouth 2 (two) times daily. (Patient not taking: Reported on 11/09/2014)  40 capsule 0  . glipiZIDE (GLUCOTROL XL) 2.5 MG 24 hr tablet Take 2.5 mg by mouth daily.      . pravastatin (PRAVACHOL) 40 MG tablet Take 20 mg by mouth daily.    . Vitamin D, Ergocalciferol, (DRISDOL) 50000 UNITS CAPS capsule Take 50,000 Units by mouth every 7 (seven) days.     No current facility-administered medications on file prior to visit.    Allergies  Allergen Reactions  . Iodinated Diagnostic Agents Other (See Comments)    REACTION:  unknown    REVIEW OF SYSTEMS:  (Positives checked otherwise negative)  CARDIOVASCULAR:  [] chest pain, [] chest pressure, [] palpitations, [] shortness of breath when laying flat, [x] shortness of breath with exertion,  []x pain in feet when walking, [] pain in feet when laying flat, [] history of blood clot in veins (DVT), [] history of phlebitis, [x] swelling in legs, [] varicose veins  PULMONARY:  [] productive cough, [] asthma, [x] wheezing  NEUROLOGIC:  [] weakness in arms or legs, []  numbness in arms or legs, [] difficulty speaking or slurred speech, [] temporary loss of vision in one eye, [] dizziness  HEMATOLOGIC:  [] bleeding problems, [] problems with blood clotting too easily  MUSCULOSKEL:  [] joint pain, [] joint swelling  GASTROINTEST:  [] vomiting blood, [] blood in stool     GENITOURINARY:  [] burning with urination, [] blood in urine  PSYCHIATRIC:  [] history of major depression  INTEGUMENTARY:  [] rashes, [x] ulcers  CONSTITUTIONAL:  [] fever, [] chills    Physical Examination  Filed Vitals:   01/07/15 1456 01/07/15 1459  BP: 156/78 145/79  Pulse: 79 70  Resp: 16   Height: 5' 6" (1.676 m)   Weight: 220 lb (99.791 kg)     General: A&O x 3, WDWN  Pulmonary: Sym exp, good air movt, CTAB, no rales, rhonchi, & wheezing  Cardiac: RRR, Nl S1, S2, no Murmurs, rubs or gallops  Gastrointestinal: soft, NTND, -G/R, - HSM, - masses, - CVAT B  Musculoskeletal: M/S 5/5 throughout , Extremities without ischemic changes   Vascular palpable radial arteries bil  Upper ext arterial duplex: Brachial triphasic flow/ U/R biphasic flow bil. Vein mapping shows acceptable bilateral Cephalic veins in the upper arm.  She is right handed   Medical Decision Making  [name] is a 81 y.o. female who presents with: ESRD.   The patient is scheduled for: Left BC av fistula creation  Dr. Chen discussed with the patient the nature of angiographic procedures, especially the limited patencies of any endovascular intervention.  The patient is aware of that the risks of an angiographic procedure include but are not limited to: bleeding, infection, access site complications, renal failure, embolization, rupture of vessel, dissection, possible need for emergent surgical intervention, possible need for surgical procedures to treat the patient's pathology, and stroke and death.    Risk, benefits, and alternatives to access surgery were discussed.  The patient is  aware the risks include but are not limited to: bleeding, infection, steal syndrome, nerve damage, ischemic monomelic neuropathy, failure to mature, and need for additional procedures. The patient is aware the risks of tunneled dialysis catheter placement include but are not limited to: bleeding, infection, central venous injury, pneumothorax, possible venous stenosis, possible malpositioning in the venous system, and possible infections related to long-term catheter presence.   The patient is aware of the risks and agrees to proceed.  She wishes to proceed   with surgery 01/26/2015   Clinton Gallant [facility] PA-C [facility] [place] Office: [phone]  She was seen in conjunction with Dr. Imogene Burn   01/07/2015, 4:03 PM   Addendum  I have independently interviewed and examined the patient, and I agree with the physician assistant's findings.  Would start with L BC AVF in this patient.  Risk, benefits, and alternatives to access surgery were discussed.  The patient is aware the risks include but are not limited to: bleeding, infection, steal syndrome, nerve damage, ischemic monomelic neuropathy, failure to mature, need for additional procedures, death and stroke.  The patient agrees to proceed forward with the procedure.   Leonides Sake, MD [facility] [place] Office: [phone] Pager: [phone]  01/07/2015, 6:50 PM

## 2015-01-07 NOTE — Progress Notes (Signed)
Filed Vitals:   01/07/15 1456 01/07/15 1459  BP: 156/78 145/79  Pulse: 79 70  Resp: 16   Height: 5\' 6"  (1.676 m)   Weight: 220 lb (99.791 kg)    Body mass index is 35.53 kg/(m^2).

## 2015-01-13 ENCOUNTER — Encounter (HOSPITAL_COMMUNITY): Payer: Medicare Other

## 2015-01-17 DIAGNOSIS — E119 Type 2 diabetes mellitus without complications: Secondary | ICD-10-CM | POA: Diagnosis not present

## 2015-01-18 DIAGNOSIS — E119 Type 2 diabetes mellitus without complications: Secondary | ICD-10-CM | POA: Diagnosis not present

## 2015-01-19 DIAGNOSIS — E119 Type 2 diabetes mellitus without complications: Secondary | ICD-10-CM | POA: Diagnosis not present

## 2015-01-20 ENCOUNTER — Encounter (HOSPITAL_COMMUNITY)
Admission: RE | Admit: 2015-01-20 | Discharge: 2015-01-20 | Disposition: A | Discharge status: Home / Self Care | Payer: Medicare Other | Source: Ambulatory Visit | Attending: Nephrology | Admitting: Nephrology

## 2015-01-20 DIAGNOSIS — E119 Type 2 diabetes mellitus without complications: Secondary | ICD-10-CM | POA: Diagnosis not present

## 2015-01-20 DIAGNOSIS — D631 Anemia in chronic kidney disease: Secondary | ICD-10-CM | POA: Insufficient documentation

## 2015-01-20 DIAGNOSIS — N183 Chronic kidney disease, stage 3 (moderate): Secondary | ICD-10-CM | POA: Diagnosis not present

## 2015-01-20 LAB — IRON AND TIBC
Iron: 84 ug/dL (ref 28–170)
Saturation Ratios: 43 % — ABNORMAL HIGH (ref 10.4–31.8)
TIBC: 195 ug/dL — ABNORMAL LOW (ref 250–450)
UIBC: 111 ug/dL

## 2015-01-20 LAB — POCT HEMOGLOBIN-HEMACUE: HEMOGLOBIN: 10.2 g/dL — AB (ref 12.0–15.0)

## 2015-01-20 LAB — FERRITIN: Ferritin: 1013 ng/mL — ABNORMAL HIGH (ref 11–307)

## 2015-01-20 MED ORDER — EPOETIN ALFA 20000 UNIT/ML IJ SOLN: 20000.0000 [IU] | INTRAMUSCULAR | Status: DC

## 2015-01-20 MED ORDER — EPOETIN ALFA 20000 UNIT/ML IJ SOLN
INTRAMUSCULAR | Status: AC
  Administered 2015-01-20: 20000 [IU] via SUBCUTANEOUS
  Filled 2015-01-20: qty 1

## 2015-01-21 ENCOUNTER — Other Ambulatory Visit: Payer: Self-pay

## 2015-01-21 DIAGNOSIS — E119 Type 2 diabetes mellitus without complications: Secondary | ICD-10-CM | POA: Diagnosis not present

## 2015-01-24 ENCOUNTER — Encounter (HOSPITAL_COMMUNITY): Payer: Self-pay | Admitting: *Deleted

## 2015-01-25 MED ORDER — DEXTROSE 5 % IV SOLN
1.5000 g | INTRAVENOUS | Status: AC
  Administered 2015-01-26: 1.5 g via INTRAVENOUS
  Filled 2015-01-25: qty 1.5

## 2015-01-26 ENCOUNTER — Ambulatory Visit (HOSPITAL_COMMUNITY): Payer: Medicare Other | Admitting: Critical Care Medicine

## 2015-01-26 ENCOUNTER — Other Ambulatory Visit (HOSPITAL_COMMUNITY): Payer: Self-pay | Admitting: *Deleted

## 2015-01-26 ENCOUNTER — Encounter (HOSPITAL_COMMUNITY): Payer: Self-pay | Admitting: Critical Care Medicine

## 2015-01-26 ENCOUNTER — Encounter (HOSPITAL_COMMUNITY)
Admission: RE | Disposition: A | Discharge status: Home / Self Care | Payer: Self-pay | Source: Ambulatory Visit | Attending: Vascular Surgery

## 2015-01-26 ENCOUNTER — Ambulatory Visit (HOSPITAL_COMMUNITY)
Admission: RE | Admit: 2015-01-26 | Discharge: 2015-01-26 | Disposition: A | Discharge status: Home / Self Care | Payer: Medicare Other | Source: Ambulatory Visit | Attending: Vascular Surgery | Admitting: Vascular Surgery

## 2015-01-26 DIAGNOSIS — N186 End stage renal disease: Secondary | ICD-10-CM | POA: Insufficient documentation

## 2015-01-26 DIAGNOSIS — I509 Heart failure, unspecified: Secondary | ICD-10-CM | POA: Diagnosis not present

## 2015-01-26 DIAGNOSIS — I878 Other specified disorders of veins: Secondary | ICD-10-CM | POA: Diagnosis not present

## 2015-01-26 DIAGNOSIS — Z79899 Other long term (current) drug therapy: Secondary | ICD-10-CM | POA: Diagnosis not present

## 2015-01-26 DIAGNOSIS — E039 Hypothyroidism, unspecified: Secondary | ICD-10-CM | POA: Diagnosis not present

## 2015-01-26 DIAGNOSIS — M810 Age-related osteoporosis without current pathological fracture: Secondary | ICD-10-CM | POA: Diagnosis not present

## 2015-01-26 DIAGNOSIS — I4510 Unspecified right bundle-branch block: Secondary | ICD-10-CM | POA: Insufficient documentation

## 2015-01-26 DIAGNOSIS — E669 Obesity, unspecified: Secondary | ICD-10-CM | POA: Insufficient documentation

## 2015-01-26 DIAGNOSIS — I120 Hypertensive chronic kidney disease with stage 5 chronic kidney disease or end stage renal disease: Secondary | ICD-10-CM | POA: Insufficient documentation

## 2015-01-26 DIAGNOSIS — Z6834 Body mass index (BMI) 34.0-34.9, adult: Secondary | ICD-10-CM | POA: Insufficient documentation

## 2015-01-26 DIAGNOSIS — M069 Rheumatoid arthritis, unspecified: Secondary | ICD-10-CM | POA: Diagnosis not present

## 2015-01-26 DIAGNOSIS — I10 Essential (primary) hypertension: Secondary | ICD-10-CM | POA: Insufficient documentation

## 2015-01-26 DIAGNOSIS — E785 Hyperlipidemia, unspecified: Secondary | ICD-10-CM | POA: Insufficient documentation

## 2015-01-26 DIAGNOSIS — N184 Chronic kidney disease, stage 4 (severe): Secondary | ICD-10-CM | POA: Diagnosis not present

## 2015-01-26 DIAGNOSIS — R9431 Abnormal electrocardiogram [ECG] [EKG]: Secondary | ICD-10-CM | POA: Diagnosis not present

## 2015-01-26 DIAGNOSIS — Z792 Long term (current) use of antibiotics: Secondary | ICD-10-CM | POA: Diagnosis not present

## 2015-01-26 DIAGNOSIS — E119 Type 2 diabetes mellitus without complications: Secondary | ICD-10-CM | POA: Insufficient documentation

## 2015-01-26 HISTORY — PX: AV FISTULA PLACEMENT: SHX1204

## 2015-01-26 LAB — POCT I-STAT 4, (NA,K, GLUC, HGB,HCT)
GLUCOSE: 138 mg/dL — AB (ref 65–99)
HCT: 38 % (ref 36.0–46.0)
Hemoglobin: 12.9 g/dL (ref 12.0–15.0)
Potassium: 4.8 mmol/L (ref 3.5–5.1)
Sodium: 141 mmol/L (ref 135–145)

## 2015-01-26 LAB — GLUCOSE, CAPILLARY: GLUCOSE-CAPILLARY: 135 mg/dL — AB (ref 65–99)

## 2015-01-26 SURGERY — ARTERIOVENOUS (AV) FISTULA CREATION
Anesthesia: Monitor Anesthesia Care | Site: Arm Upper | Laterality: Left

## 2015-01-26 MED ORDER — LIDOCAINE HCL (CARDIAC) 20 MG/ML IV SOLN
INTRAVENOUS | Status: AC
  Filled 2015-01-26: qty 5

## 2015-01-26 MED ORDER — PROPOFOL INFUSION 10 MG/ML OPTIME
INTRAVENOUS | Status: DC | PRN
  Administered 2015-01-26: 50 ug/kg/min via INTRAVENOUS

## 2015-01-26 MED ORDER — SUCCINYLCHOLINE CHLORIDE 20 MG/ML IJ SOLN
INTRAMUSCULAR | Status: AC
  Filled 2015-01-26: qty 1

## 2015-01-26 MED ORDER — SODIUM CHLORIDE 0.9 % IJ SOLN
INTRAMUSCULAR | Status: AC
  Filled 2015-01-26: qty 10

## 2015-01-26 MED ORDER — THROMBIN 20000 UNITS EX SOLR
CUTANEOUS | Status: AC
  Filled 2015-01-26: qty 20000

## 2015-01-26 MED ORDER — PHENYLEPHRINE HCL 10 MG/ML IJ SOLN
10.0000 mg | INTRAMUSCULAR | Status: DC | PRN
  Administered 2015-01-26: 20 ug/min via INTRAVENOUS

## 2015-01-26 MED ORDER — EPHEDRINE SULFATE 50 MG/ML IJ SOLN
INTRAMUSCULAR | Status: AC
  Filled 2015-01-26: qty 1

## 2015-01-26 MED ORDER — FENTANYL CITRATE (PF) 100 MCG/2ML IJ SOLN: 25.0000 ug | INTRAMUSCULAR | Status: DC | PRN

## 2015-01-26 MED ORDER — ONDANSETRON HCL 4 MG/2ML IJ SOLN: 4.0000 mg | Freq: Once | INTRAMUSCULAR | Status: DC | PRN

## 2015-01-26 MED ORDER — MIDAZOLAM HCL 2 MG/2ML IJ SOLN
INTRAMUSCULAR | Status: AC
  Filled 2015-01-26: qty 2

## 2015-01-26 MED ORDER — MIDAZOLAM HCL 5 MG/5ML IJ SOLN
INTRAMUSCULAR | Status: DC | PRN
  Administered 2015-01-26: 1 mg via INTRAVENOUS

## 2015-01-26 MED ORDER — CHLORHEXIDINE GLUCONATE CLOTH 2 % EX PADS: 6.0000 | MEDICATED_PAD | Freq: Once | CUTANEOUS | Status: DC

## 2015-01-26 MED ORDER — BUPIVACAINE HCL (PF) 0.5 % IJ SOLN
INTRAMUSCULAR | Status: DC | PRN
  Administered 2015-01-26: 1.5 mL

## 2015-01-26 MED ORDER — 0.9 % SODIUM CHLORIDE (POUR BTL) OPTIME
TOPICAL | Status: DC | PRN
  Administered 2015-01-26: 1000 mL

## 2015-01-26 MED ORDER — LIDOCAINE-EPINEPHRINE (PF) 1 %-1:200000 IJ SOLN
INTRAMUSCULAR | Status: AC
  Filled 2015-01-26: qty 10

## 2015-01-26 MED ORDER — HYDROCODONE-ACETAMINOPHEN 7.5-325 MG PO TABS: 1.0000 | ORAL_TABLET | Freq: Once | ORAL | Status: DC | PRN

## 2015-01-26 MED ORDER — OXYCODONE-ACETAMINOPHEN 5-325 MG PO TABS: 1.0000 | ORAL_TABLET | Freq: Four times a day (QID) | ORAL | Status: DC | PRN

## 2015-01-26 MED ORDER — ONDANSETRON HCL 4 MG/2ML IJ SOLN
INTRAMUSCULAR | Status: AC
  Filled 2015-01-26: qty 2

## 2015-01-26 MED ORDER — SODIUM CHLORIDE 0.9 % IR SOLN
Status: DC | PRN
  Administered 2015-01-26: 500 mL

## 2015-01-26 MED ORDER — FENTANYL CITRATE (PF) 250 MCG/5ML IJ SOLN
INTRAMUSCULAR | Status: AC
  Filled 2015-01-26: qty 5

## 2015-01-26 MED ORDER — PROPOFOL 10 MG/ML IV BOLUS
INTRAVENOUS | Status: AC
  Filled 2015-01-26: qty 20

## 2015-01-26 MED ORDER — LIDOCAINE-EPINEPHRINE (PF) 1 %-1:200000 IJ SOLN
INTRAMUSCULAR | Status: DC | PRN
  Administered 2015-01-26: 1.5 mL

## 2015-01-26 MED ORDER — FENTANYL CITRATE (PF) 100 MCG/2ML IJ SOLN
INTRAMUSCULAR | Status: DC | PRN
  Administered 2015-01-26: 50 ug via INTRAVENOUS
  Administered 2015-01-26 (×3): 25 ug via INTRAVENOUS

## 2015-01-26 MED ORDER — ONDANSETRON HCL 4 MG/2ML IJ SOLN
INTRAMUSCULAR | Status: DC | PRN
  Administered 2015-01-26: 4 mg via INTRAVENOUS

## 2015-01-26 MED ORDER — SODIUM CHLORIDE 0.9 % IV SOLN
INTRAVENOUS | Status: DC
  Administered 2015-01-26: 08:00:00 via INTRAVENOUS

## 2015-01-26 SURGICAL SUPPLY — 35 items
ARMBAND PINK RESTRICT EXTREMIT (MISCELLANEOUS) ×3 IMPLANT
CANISTER SUCTION 2500CC (MISCELLANEOUS) ×3 IMPLANT
CLIP TI MEDIUM 6 (CLIP) ×3 IMPLANT
CLIP TI WIDE RED SMALL 6 (CLIP) ×3 IMPLANT
COVER PROBE W GEL 5X96 (DRAPES) ×3 IMPLANT
DECANTER SPIKE VIAL GLASS SM (MISCELLANEOUS) ×3 IMPLANT
ELECT REM PT RETURN 9FT ADLT (ELECTROSURGICAL) ×3
ELECTRODE REM PT RTRN 9FT ADLT (ELECTROSURGICAL) ×1 IMPLANT
GLOVE BIO SURGEON STRL SZ7 (GLOVE) ×3 IMPLANT
GLOVE BIOGEL PI IND STRL 6.5 (GLOVE) ×2 IMPLANT
GLOVE BIOGEL PI IND STRL 7.0 (GLOVE) ×1 IMPLANT
GLOVE BIOGEL PI IND STRL 7.5 (GLOVE) ×1 IMPLANT
GLOVE BIOGEL PI INDICATOR 6.5 (GLOVE) ×4
GLOVE BIOGEL PI INDICATOR 7.0 (GLOVE) ×2
GLOVE BIOGEL PI INDICATOR 7.5 (GLOVE) ×2
GLOVE SURG SS PI 6.0 STRL IVOR (GLOVE) ×3 IMPLANT
GLOVE SURG SS PI 7.0 STRL IVOR (GLOVE) ×3 IMPLANT
GOWN STRL REUS W/ TWL LRG LVL3 (GOWN DISPOSABLE) ×3 IMPLANT
GOWN STRL REUS W/TWL LRG LVL3 (GOWN DISPOSABLE) ×6
KIT BASIN OR (CUSTOM PROCEDURE TRAY) ×3 IMPLANT
KIT ROOM TURNOVER OR (KITS) ×3 IMPLANT
LIQUID BAND (GAUZE/BANDAGES/DRESSINGS) ×3 IMPLANT
NS IRRIG 1000ML POUR BTL (IV SOLUTION) ×3 IMPLANT
PACK CV ACCESS (CUSTOM PROCEDURE TRAY) ×3 IMPLANT
PAD ARMBOARD 7.5X6 YLW CONV (MISCELLANEOUS) ×6 IMPLANT
SPONGE SURGIFOAM ABS GEL 100 (HEMOSTASIS) IMPLANT
SUT MNCRL AB 4-0 PS2 18 (SUTURE) ×3 IMPLANT
SUT PROLENE 6 0 BV (SUTURE) ×3 IMPLANT
SUT PROLENE 7 0 BV 1 (SUTURE) ×3 IMPLANT
SUT SILK 4 0 (SUTURE) ×2
SUT SILK 4-0 18XBRD TIE 12 (SUTURE) ×1 IMPLANT
SUT VIC AB 3-0 SH 27 (SUTURE) ×2
SUT VIC AB 3-0 SH 27X BRD (SUTURE) ×1 IMPLANT
UNDERPAD 30X30 INCONTINENT (UNDERPADS AND DIAPERS) ×3 IMPLANT
WATER STERILE IRR 1000ML POUR (IV SOLUTION) ×3 IMPLANT

## 2015-01-26 NOTE — Interval H&P Note (Signed)
[facility] [place]  History and Physical Update  The patient was interviewed and re-examined.  The patient's previous History and Physical has been reviewed and is unchanged from my consult.  There is no change in the plan of care: L BC AVF.  Risk, benefits, and alternatives to access surgery were discussed.  The patient is aware the risks include but are not limited to: bleeding, infection, steal syndrome, nerve damage, ischemic monomelic neuropathy, failure to mature, need for additional procedures, death and stroke.  The patient agrees to proceed forward with the procedure.   Leonides Sake, MD [facility] [place] Office: [phone] Pager: [phone]  01/26/2015, 8:06 AM

## 2015-01-26 NOTE — Anesthesia Preprocedure Evaluation (Addendum)
Anesthesia Evaluation  Patient identified by MRN, date of birth, ID band Patient awake    Reviewed: Allergy & Precautions, NPO status , Patient's Chart, lab work & pertinent test results  Airway Mallampati: II       Dental  (+) Dental Advisory Given, Edentulous Upper, Edentulous Lower   Pulmonary shortness of breath,  breath sounds clear to auscultation        Cardiovascular hypertension, Pt. on medications +CHF Rhythm:Regular Rate:Normal     Neuro/Psych    GI/Hepatic   Endo/Other  diabetesHypothyroidism   Renal/GU      Musculoskeletal  (+) Arthritis -,   Abdominal (+) + obese,   Peds  Hematology  (+) anemia ,   Anesthesia Other Findings   Reproductive/Obstetrics                           Anesthesia Physical Anesthesia Plan  ASA: III  Anesthesia Plan: MAC   Post-op Pain Management:    Induction: Intravenous  Airway Management Planned: Simple Face Mask  Additional Equipment:   Intra-op Plan:   Post-operative Plan:   Informed Consent: I have reviewed the patients History and Physical, chart, labs and discussed the procedure including the risks, benefits and alternatives for the proposed anesthesia with the patient or authorized representative who has indicated his/her understanding and acceptance.   Dental advisory given  Plan Discussed with: Anesthesiologist and Surgeon  Anesthesia Plan Comments:        Anesthesia Quick Evaluation

## 2015-01-26 NOTE — Op Note (Signed)
OPERATIVE NOTE   PROCEDURE: left brachiocephalic arteriovenous fistula placement  PRE-OPERATIVE DIAGNOSIS: chronic kidney disease stage IV-V   POST-OPERATIVE DIAGNOSIS: same as above   SURGEON: Leonides Sake, MD  ANESTHESIA: local and MAC  ESTIMATED BLOOD LOSS: 50 cc  FINDING(S): 1.  Somewhat sclerotic cephalic vein  2.  Palpable thrill at end of case 3.  Dopplerable left radial signal at end of case without significant augmentation  SPECIMEN(S):  none  INDICATIONS:   [name] is a 81 y.o. female who presents with chronic kidney disease stage IV-V.  The patient is scheduled for left brachiocephalic arteriovenous fistula placement.  The patient is aware the risks include but are not limited to: bleeding, infection, steal syndrome, nerve damage, ischemic monomelic neuropathy, failure to mature, and need for additional procedures.  The patient is aware of the risks of the procedure and elects to proceed forward.  DESCRIPTION: After full informed written consent was obtained from the patient, the patient was brought back to the operating room and placed supine upon the operating table.  Prior to induction, the patient received IV antibiotics.   After obtaining adequate anesthesia, the patient was then prepped and draped in the standard fashion for a left arm access procedure.  I turned my attention first to identifying the patient's cephalic vein and brachial artery.  Using SonoSite guidance, the location of these vessels were marked out on the skin.   At this point, I injected local anesthetic to obtain a field block of the antecubitum.  In total, I injected about 5 mL of a 1:1 mixture of 0.5% Marcaine without epinephrine and 1% lidocaine with epinephrine.  I made a transverse incision at the level of the antecubitum and dissected through the subcutaneous tissue and fascia to gain exposure of the brachial artery.  This was noted to be 3-3.5 mm in diameter externally.  This was  dissected out proximally and distally and controlled with vessel loops .  I then dissected out the cephalic vein.  This was noted to be 3 mm in diameter externally.  The distal segment of the vein was ligated with a  2-0 silk, and the vein was transected.  The proximal segment was interrogated with serial dilators.  The vein accepted up to a 4 mm dilator without any difficulty.  I then instilled the heparinized saline into the vein and clamped it.  At this point, I reset my exposure of the brachial artery and placed the artery under tension proximally and distally.  I made an arteriotomy with a #11 blade, and then I extended the arteriotomy with a Potts scissor.  I injected heparinized saline proximal and distal to this arteriotomy.  The vein was then sewn to the artery in an end-to-side configuration with a running stitch of 7-0 Prolene.  Prior to completing this anastomosis, I allowed the vein and artery to backbleed.  There was no evidence of clot from any vessels.  I completed the anastomosis in the usual fashion and then released all vessel loops and clamps.  There was a palpable  thrill in the venous outflow, and there was a dopperable radial pulse.  At this point, I irrigated out the surgical wound.  There was no further active bleeding.  The subcutaneous tissue was reapproximated with a running stitch of 3-0 Vicryl.  The skin was then reapproximated with a running subcuticular stitch of 4-0 Vicryl.  The skin was then cleaned, dried, and reinforced with Dermabond.  The patient tolerated this  procedure well.   COMPLICATIONS: none  CONDITION: stable   Leonides Sake, MD [facility] [place] Office: [phone] Pager: [phone]  01/26/2015, 9:54 AM

## 2015-01-26 NOTE — Transfer of Care (Signed)
Immediate Anesthesia Transfer of Care Note  Patient: Surya R Vogel  Procedure(s) Performed: Procedure(s): LEFT ARM ARTERIOVENOUS (AV) FISTULA CREATION (Left)  Patient Location: PACU  Anesthesia Type:MAC  Level of Consciousness: awake, alert  and oriented  Airway & Oxygen Therapy: Patient Spontanous Breathing and Patient connected to nasal cannula oxygen  Post-op Assessment: Report given to RN, Post -op Vital signs reviewed and stable and Patient moving all extremities X 4  Post vital signs: Reviewed and stable  Last Vitals:  Filed Vitals:   01/26/15 0735  BP: 170/83  Pulse: 76  Temp: 36.4 C  Resp: 20  HR 69, BP 118/49, Sats 100% on 2L [place], RR 14  Complications: No apparent anesthesia complications

## 2015-01-26 NOTE — Anesthesia Postprocedure Evaluation (Signed)
  Anesthesia Post-op Note  Patient: [name]  Procedure(s) Performed: Procedure(s): LEFT ARM ARTERIOVENOUS (AV) FISTULA CREATION (Left)  Patient Location: PACU  Anesthesia Type: MAC  Level of Consciousness: awake, alert  and oriented  Airway and Oxygen Therapy: Patient Spontanous Breathing  Post-op Pain: none  Post-op Assessment: Post-op Vital signs reviewed, Patient's Cardiovascular Status Stable, Respiratory Function Stable, Patent Airway and Pain level controlled  Post-op Vital Signs: stable  Last Vitals:  Filed Vitals:   01/26/15 1049  BP: 155/63  Pulse: 79  Temp:   Resp: 18    Complications: No apparent anesthesia complications

## 2015-01-26 NOTE — Anesthesia Procedure Notes (Signed)
Procedure Name: MAC Date/Time: 01/26/2015 8:30 AM Performed by: Glo Herring B Pre-anesthesia Checklist: Patient identified, Timeout performed, Emergency Drugs available, Suction available and Patient being monitored Patient Re-evaluated:Patient Re-evaluated prior to inductionOxygen Delivery Method: Simple face mask Intubation Type: IV induction Placement Confirmation: positive ETCO2 and breath sounds checked- equal and bilateral Dental Injury: Teeth and Oropharynx as per pre-operative assessment

## 2015-01-26 NOTE — H&P (View-Only) (Signed)
Brief History and Physical  History of Present Illness  [name] is a 81 y.o. female who presents with chief complaint: ESRD stage IV CKD.  The patient presents today for evaluation for access creation.  Pt has never had central line placed.  She denies any PCM placement.  She has never had permanent access placed.  She has decided to start HD with she becomes ESRD.  Past medical history includes:   DM, hypertension, and hyperlipidemia.  She takes a daily stain, demadex, and po DM medications.    Past Medical History  Diagnosis Date  . CHF (congestive heart failure)      preserved left ventricular systolic function  . Hypertension   . Overweight(278.02)   . Hypothyroid   . Vaginal pessary present ~ 2012  . CKD (chronic kidney disease) stage 4, GFR 15-29 ml/min     [name] 02/16/2013  . Type II diabetes mellitus   . Anemia associated with chronic renal failure     ESA therapy  . History of blood transfusion 1975  . Rheumatoid arthritis   . Osteoporosis   . Pneumonia 02/2011    [name] 02/20/2011 (02/16/2013)  . Diabetic foot ulcers     "get them on borh feet" (02/16/2013)    Past Surgical History  Procedure Laterality Date  . Cataract extraction w/ intraocular lens  implant, bilateral Bilateral 1990's  . Orif femur fracture Left 1975    "got a rod and screw in it" (02/16/2013)    History   Social History  . Marital Status: Divorced    Spouse Name: N/A  . Number of Children: N/A  . Years of Education: N/A   Occupational History  . Retired - [employer]    Social History Main Topics  . Smoking status: Never Smoker   . Smokeless tobacco: Never Used  . Alcohol Use: No  . Drug Use: No  . Sexual Activity: No   Other Topics Concern  . Not on file   Social History Narrative   Divorced   No regular exercise    Family History  Problem Relation Age of Onset  . Stroke Other   . Heart disease Other   . Kidney disease Other   . Cancer Daughter     cervical  .  Cancer Grandchild     lymph nodes  . Heart disease Mother   . Diabetes Father     Current Outpatient Prescriptions on File Prior to Visit  Medication Sig Dispense Refill  . ALPRAZolam (XANAX) 0.5 MG tablet Take 0.5 mg by mouth every 6 (six) hours as needed for anxiety.     Marland Kitchen amLODipine (NORVASC) 10 MG tablet Take 10 mg by mouth daily.      . calcitRIOL (ROCALTROL) 0.25 MCG capsule Take 0.25 mcg by mouth daily.    . fish oil-omega-3 fatty acids 1000 MG capsule Take 1 capsule by mouth daily.      Marland Kitchen levothyroxine (SYNTHROID, LEVOTHROID) 100 MCG tablet Take 125 mcg by mouth daily.     Marland Kitchen torsemide (DEMADEX) 20 MG tablet Take 20 mg by mouth daily.      Marland Kitchen amoxicillin (AMOXIL) 500 MG capsule Take 500 mg by mouth 2 (two) times daily.    . cephALEXin (KEFLEX) 500 MG capsule Take 500 mg by mouth 2 (two) times daily.    Marland Kitchen doxycycline (VIBRAMYCIN) 50 MG capsule Take 2 capsules (100 mg total) by mouth 2 (two) times daily. (Patient not taking: Reported on 11/09/2014)  40 capsule 0  . glipiZIDE (GLUCOTROL XL) 2.5 MG 24 hr tablet Take 2.5 mg by mouth daily.      . pravastatin (PRAVACHOL) 40 MG tablet Take 20 mg by mouth daily.    . Vitamin D, Ergocalciferol, (DRISDOL) 50000 UNITS CAPS capsule Take 50,000 Units by mouth every 7 (seven) days.     No current facility-administered medications on file prior to visit.    Allergies  Allergen Reactions  . Iodinated Diagnostic Agents Other (See Comments)    REACTION:  unknown    REVIEW OF SYSTEMS:  (Positives checked otherwise negative)  CARDIOVASCULAR:  []  chest pain, []  chest pressure, []  palpitations, []  shortness of breath when laying flat, [x]  shortness of breath with exertion,  [] x pain in feet when walking, []  pain in feet when laying flat, []  history of blood clot in veins (DVT), []  history of phlebitis, [x]  swelling in legs, []  varicose veins  PULMONARY:  []  productive cough, []  asthma, [x]  wheezing  NEUROLOGIC:  []  weakness in arms or legs, []   numbness in arms or legs, []  difficulty speaking or slurred speech, []  temporary loss of vision in one eye, []  dizziness  HEMATOLOGIC:  []  bleeding problems, []  problems with blood clotting too easily  MUSCULOSKEL:  []  joint pain, []  joint swelling  GASTROINTEST:  []  vomiting blood, []  blood in stool     GENITOURINARY:  []  burning with urination, []  blood in urine  PSYCHIATRIC:  []  history of major depression  INTEGUMENTARY:  []  rashes, [x]  ulcers  CONSTITUTIONAL:  []  fever, []  chills    Physical Examination  Filed Vitals:   01/07/15 1456 01/07/15 1459  BP: 156/78 145/79  Pulse: 79 70  Resp: 16   Height: 5\' 6"  (1.676 m)   Weight: 220 lb (99.791 kg)     General: A&O x 3, WDWN  Pulmonary: Sym exp, good air movt, CTAB, no rales, rhonchi, & wheezing  Cardiac: RRR, Nl S1, S2, no Murmurs, rubs or gallops  Gastrointestinal: soft, NTND, -G/R, - HSM, - masses, - CVAT B  Musculoskeletal: M/S 5/5 throughout , Extremities without ischemic changes   Vascular palpable radial arteries bil  Upper ext arterial duplex: Brachial triphasic flow/ U/R biphasic flow bil. Vein mapping shows acceptable bilateral Cephalic veins in the upper arm.  She is right handed   Medical Decision Making  [name] is a 81 y.o. female who presents with: ESRD.   The patient is scheduled for: Left BC av fistula creation  Dr. Imogene Burn discussed with the patient the nature of angiographic procedures, especially the limited patencies of any endovascular intervention.  The patient is aware of that the risks of an angiographic procedure include but are not limited to: bleeding, infection, access site complications, renal failure, embolization, rupture of vessel, dissection, possible need for emergent surgical intervention, possible need for surgical procedures to treat the patient's pathology, and stroke and death.    Risk, benefits, and alternatives to access surgery were discussed.  The patient is  aware the risks include but are not limited to: bleeding, infection, steal syndrome, nerve damage, ischemic monomelic neuropathy, failure to mature, and need for additional procedures. The patient is aware the risks of tunneled dialysis catheter placement include but are not limited to: bleeding, infection, central venous injury, pneumothorax, possible venous stenosis, possible malpositioning in the venous system, and possible infections related to long-term catheter presence.   The patient is aware of the risks and agrees to proceed.  She wishes to proceed  with surgery 01/26/2015   Clinton Gallant [facility] PA-C [facility] [place] Office: [phone]  She was seen in conjunction with Dr. Imogene Burn   01/07/2015, 4:03 PM   Addendum  I have independently interviewed and examined the patient, and I agree with the physician assistant's findings.  Would start with L BC AVF in this patient.  Risk, benefits, and alternatives to access surgery were discussed.  The patient is aware the risks include but are not limited to: bleeding, infection, steal syndrome, nerve damage, ischemic monomelic neuropathy, failure to mature, need for additional procedures, death and stroke.  The patient agrees to proceed forward with the procedure.   Leonides Sake, MD [facility] [place] Office: [phone] Pager: [phone]  01/07/2015, 6:50 PM

## 2015-01-27 ENCOUNTER — Encounter (HOSPITAL_COMMUNITY)
Admission: RE | Admit: 2015-01-27 | Discharge: 2015-01-27 | Disposition: A | Discharge status: Home / Self Care | Payer: Medicare Other | Source: Ambulatory Visit | Attending: Nephrology | Admitting: Nephrology

## 2015-01-27 ENCOUNTER — Encounter (HOSPITAL_COMMUNITY): Payer: Self-pay | Admitting: Vascular Surgery

## 2015-01-27 MED ORDER — EPOETIN ALFA 20000 UNIT/ML IJ SOLN: 20000.0000 [IU] | INTRAMUSCULAR | Status: DC

## 2015-01-27 NOTE — Progress Notes (Signed)
pts hgb was 12.9 on 01/26/2015  [facility] called they said that it was ok to use results from yesterday

## 2015-01-31 ENCOUNTER — Ambulatory Visit (INDEPENDENT_AMBULATORY_CARE_PROVIDER_SITE_OTHER): Payer: Medicare Other | Admitting: Obstetrics & Gynecology

## 2015-01-31 ENCOUNTER — Encounter: Payer: Self-pay | Admitting: Obstetrics & Gynecology

## 2015-01-31 VITALS — BP 130/50 | HR 76 | Wt 215.0 lb

## 2015-01-31 DIAGNOSIS — N813 Complete uterovaginal prolapse: Secondary | ICD-10-CM

## 2015-01-31 DIAGNOSIS — E119 Type 2 diabetes mellitus without complications: Secondary | ICD-10-CM | POA: Diagnosis not present

## 2015-01-31 NOTE — Progress Notes (Signed)
Patient ID: [name], female   DOB: 14-Apr-1932, 81 y.o.   MRN: [id number]      [name] presents today for routine follow up related to her pessary.   She uses a Gelhorn #5 She reports no vaginal discharge or vaginal bleeding.  Exam reveals no undue vaginal mucosal pressure of breakdown, no discharge and no vaginal bleeding.  The pessary is removed, cleaned and replaced without difficulty.    [name] will be sen back in 6 months for continued follow up.  EURE,LUTHER H 01/31/2015 2:19 PM

## 2015-02-01 DIAGNOSIS — E119 Type 2 diabetes mellitus without complications: Secondary | ICD-10-CM | POA: Diagnosis not present

## 2015-02-02 DIAGNOSIS — E119 Type 2 diabetes mellitus without complications: Secondary | ICD-10-CM | POA: Diagnosis not present

## 2015-02-03 DIAGNOSIS — E119 Type 2 diabetes mellitus without complications: Secondary | ICD-10-CM | POA: Diagnosis not present

## 2015-02-04 DIAGNOSIS — E119 Type 2 diabetes mellitus without complications: Secondary | ICD-10-CM | POA: Diagnosis not present

## 2015-02-07 DIAGNOSIS — E119 Type 2 diabetes mellitus without complications: Secondary | ICD-10-CM | POA: Diagnosis not present

## 2015-02-08 DIAGNOSIS — E119 Type 2 diabetes mellitus without complications: Secondary | ICD-10-CM | POA: Diagnosis not present

## 2015-02-09 DIAGNOSIS — E119 Type 2 diabetes mellitus without complications: Secondary | ICD-10-CM | POA: Diagnosis not present

## 2015-02-10 ENCOUNTER — Encounter (HOSPITAL_COMMUNITY)
Admission: RE | Admit: 2015-02-10 | Discharge: 2015-02-10 | Disposition: A | Discharge status: Home / Self Care | Payer: Medicare Other | Source: Ambulatory Visit | Attending: Nephrology | Admitting: Nephrology

## 2015-02-10 DIAGNOSIS — E119 Type 2 diabetes mellitus without complications: Secondary | ICD-10-CM | POA: Diagnosis not present

## 2015-02-10 DIAGNOSIS — D631 Anemia in chronic kidney disease: Secondary | ICD-10-CM | POA: Diagnosis not present

## 2015-02-10 DIAGNOSIS — N183 Chronic kidney disease, stage 3 (moderate): Secondary | ICD-10-CM | POA: Insufficient documentation

## 2015-02-10 LAB — POCT HEMOGLOBIN-HEMACUE: Hemoglobin: 10.1 g/dL — ABNORMAL LOW (ref 12.0–15.0)

## 2015-02-10 MED ORDER — EPOETIN ALFA 20000 UNIT/ML IJ SOLN
INTRAMUSCULAR | Status: DC
Start: 2015-02-10 — End: 2015-02-11
  Filled 2015-02-10: qty 1

## 2015-02-10 MED ORDER — EPOETIN ALFA 20000 UNIT/ML IJ SOLN
20000.0000 [IU] | INTRAMUSCULAR | Status: DC
  Administered 2015-02-10: 20000 [IU] via SUBCUTANEOUS

## 2015-02-11 DIAGNOSIS — E119 Type 2 diabetes mellitus without complications: Secondary | ICD-10-CM | POA: Diagnosis not present

## 2015-02-14 DIAGNOSIS — E119 Type 2 diabetes mellitus without complications: Secondary | ICD-10-CM | POA: Diagnosis not present

## 2015-02-15 DIAGNOSIS — E119 Type 2 diabetes mellitus without complications: Secondary | ICD-10-CM | POA: Diagnosis not present

## 2015-02-16 DIAGNOSIS — E119 Type 2 diabetes mellitus without complications: Secondary | ICD-10-CM | POA: Diagnosis not present

## 2015-02-17 ENCOUNTER — Encounter (HOSPITAL_COMMUNITY)
Admission: RE | Admit: 2015-02-17 | Discharge: 2015-02-17 | Disposition: A | Discharge status: Home / Self Care | Payer: Medicare Other | Source: Ambulatory Visit | Attending: Nephrology | Admitting: Nephrology

## 2015-02-17 DIAGNOSIS — N183 Chronic kidney disease, stage 3 (moderate): Secondary | ICD-10-CM | POA: Diagnosis not present

## 2015-02-17 DIAGNOSIS — E119 Type 2 diabetes mellitus without complications: Secondary | ICD-10-CM | POA: Diagnosis not present

## 2015-02-17 DIAGNOSIS — D631 Anemia in chronic kidney disease: Secondary | ICD-10-CM | POA: Diagnosis not present

## 2015-02-17 LAB — RENAL FUNCTION PANEL
ANION GAP: 12 (ref 5–15)
Albumin: 3.4 g/dL — ABNORMAL LOW (ref 3.5–5.0)
BUN: 81 mg/dL — ABNORMAL HIGH (ref 6–20)
CO2: 19 mmol/L — AB (ref 22–32)
CREATININE: 4.16 mg/dL — AB (ref 0.44–1.00)
Calcium: 8.6 mg/dL — ABNORMAL LOW (ref 8.9–10.3)
Chloride: 105 mmol/L (ref 101–111)
GFR calc non Af Amer: 9 mL/min — ABNORMAL LOW (ref >60)
GFR, EST AFRICAN AMERICAN: 11 mL/min — AB (ref >60)
Glucose, Bld: 151 mg/dL — ABNORMAL HIGH (ref 65–99)
PHOSPHORUS: 5 mg/dL — AB (ref 2.5–4.6)
POTASSIUM: 4.2 mmol/L (ref 3.5–5.1)
SODIUM: 136 mmol/L (ref 135–145)

## 2015-02-17 LAB — FERRITIN: Ferritin: 737 ng/mL — ABNORMAL HIGH (ref 11–307)

## 2015-02-17 LAB — IRON AND TIBC
IRON: 30 ug/dL (ref 28–170)
Saturation Ratios: 16 % (ref 10.4–31.8)
TIBC: 186 ug/dL — ABNORMAL LOW (ref 250–450)
UIBC: 156 ug/dL

## 2015-02-17 MED ORDER — EPOETIN ALFA 20000 UNIT/ML IJ SOLN
20000.0000 [IU] | INTRAMUSCULAR | Status: DC
  Administered 2015-02-17: 20000 [IU] via SUBCUTANEOUS

## 2015-02-17 MED ORDER — EPOETIN ALFA 20000 UNIT/ML IJ SOLN
INTRAMUSCULAR | Status: AC
  Filled 2015-02-17: qty 1

## 2015-02-18 DIAGNOSIS — E119 Type 2 diabetes mellitus without complications: Secondary | ICD-10-CM | POA: Diagnosis not present

## 2015-02-18 LAB — POCT HEMOGLOBIN-HEMACUE: Hemoglobin: 10.7 g/dL — ABNORMAL LOW (ref 12.0–15.0)

## 2015-02-21 DIAGNOSIS — E119 Type 2 diabetes mellitus without complications: Secondary | ICD-10-CM | POA: Diagnosis not present

## 2015-02-22 DIAGNOSIS — E119 Type 2 diabetes mellitus without complications: Secondary | ICD-10-CM | POA: Diagnosis not present

## 2015-02-23 DIAGNOSIS — E119 Type 2 diabetes mellitus without complications: Secondary | ICD-10-CM | POA: Diagnosis not present

## 2015-02-24 ENCOUNTER — Encounter (HOSPITAL_COMMUNITY)
Admission: RE | Admit: 2015-02-24 | Discharge: 2015-02-24 | Disposition: A | Discharge status: Home / Self Care | Payer: Medicare Other | Source: Ambulatory Visit | Attending: Nephrology | Admitting: Nephrology

## 2015-02-24 DIAGNOSIS — N183 Chronic kidney disease, stage 3 (moderate): Secondary | ICD-10-CM | POA: Diagnosis not present

## 2015-02-24 DIAGNOSIS — E119 Type 2 diabetes mellitus without complications: Secondary | ICD-10-CM | POA: Diagnosis not present

## 2015-02-24 DIAGNOSIS — D631 Anemia in chronic kidney disease: Secondary | ICD-10-CM | POA: Diagnosis not present

## 2015-02-24 LAB — POCT HEMOGLOBIN-HEMACUE: HEMOGLOBIN: 10.7 g/dL — AB (ref 12.0–15.0)

## 2015-02-24 MED ORDER — EPOETIN ALFA 20000 UNIT/ML IJ SOLN
INTRAMUSCULAR | Status: AC
  Filled 2015-02-24: qty 1

## 2015-02-24 MED ORDER — EPOETIN ALFA 20000 UNIT/ML IJ SOLN
20000.0000 [IU] | INTRAMUSCULAR | Status: DC
  Administered 2015-02-24: 20000 [IU] via SUBCUTANEOUS

## 2015-02-25 DIAGNOSIS — E119 Type 2 diabetes mellitus without complications: Secondary | ICD-10-CM | POA: Diagnosis not present

## 2015-03-03 ENCOUNTER — Encounter (HOSPITAL_COMMUNITY)
Admission: RE | Admit: 2015-03-03 | Discharge: 2015-03-03 | Disposition: A | Discharge status: Home / Self Care | Payer: Medicare Other | Source: Ambulatory Visit | Attending: Nephrology | Admitting: Nephrology

## 2015-03-03 DIAGNOSIS — N183 Chronic kidney disease, stage 3 (moderate): Secondary | ICD-10-CM | POA: Diagnosis not present

## 2015-03-03 DIAGNOSIS — D631 Anemia in chronic kidney disease: Secondary | ICD-10-CM | POA: Insufficient documentation

## 2015-03-03 DIAGNOSIS — Z79899 Other long term (current) drug therapy: Secondary | ICD-10-CM | POA: Diagnosis not present

## 2015-03-03 DIAGNOSIS — Z5181 Encounter for therapeutic drug level monitoring: Secondary | ICD-10-CM | POA: Diagnosis not present

## 2015-03-03 LAB — POCT HEMOGLOBIN-HEMACUE: HEMOGLOBIN: 10.1 g/dL — AB (ref 12.0–15.0)

## 2015-03-03 MED ORDER — EPOETIN ALFA 20000 UNIT/ML IJ SOLN
INTRAMUSCULAR | Status: AC
  Filled 2015-03-03: qty 1

## 2015-03-03 MED ORDER — EPOETIN ALFA 20000 UNIT/ML IJ SOLN
20000.0000 [IU] | INTRAMUSCULAR | Status: DC
  Administered 2015-03-03: 20000 [IU] via SUBCUTANEOUS

## 2015-03-10 ENCOUNTER — Encounter (HOSPITAL_COMMUNITY)
Admission: RE | Admit: 2015-03-10 | Discharge: 2015-03-10 | Disposition: A | Discharge status: Home / Self Care | Payer: Medicare Other | Source: Ambulatory Visit | Attending: Nephrology | Admitting: Nephrology

## 2015-03-10 DIAGNOSIS — N183 Chronic kidney disease, stage 3 (moderate): Secondary | ICD-10-CM | POA: Diagnosis not present

## 2015-03-10 DIAGNOSIS — D631 Anemia in chronic kidney disease: Secondary | ICD-10-CM | POA: Diagnosis not present

## 2015-03-10 LAB — POCT HEMOGLOBIN-HEMACUE: Hemoglobin: 11.1 g/dL — ABNORMAL LOW (ref 12.0–15.0)

## 2015-03-10 MED ORDER — EPOETIN ALFA 20000 UNIT/ML IJ SOLN: 20000.0000 [IU] | INTRAMUSCULAR | Status: DC

## 2015-03-15 ENCOUNTER — Encounter: Payer: Self-pay | Admitting: Vascular Surgery

## 2015-03-18 ENCOUNTER — Ambulatory Visit (INDEPENDENT_AMBULATORY_CARE_PROVIDER_SITE_OTHER): Payer: Self-pay | Admitting: Vascular Surgery

## 2015-03-18 ENCOUNTER — Encounter: Payer: Self-pay | Admitting: Vascular Surgery

## 2015-03-18 VITALS — BP 136/47 | HR 75 | Temp 97.8°F | Resp 16 | Ht 66.0 in | Wt 219.0 lb

## 2015-03-18 DIAGNOSIS — N184 Chronic kidney disease, stage 4 (severe): Secondary | ICD-10-CM

## 2015-03-18 NOTE — Progress Notes (Signed)
    Postoperative Access Visit   History of Present Illness  [name] is a 81 y.o. year old female who presents for postoperative follow-up for: L BC AVF (Date: 01/26/15).  The patient's wounds are healed.  The patient notes no steal symptoms.  The patient is able to complete their activities of daily living.  The patient's current symptoms are: none.  For VQI Use Only  PRE-ADM LIVING: Home  AMB STATUS: Ambulatory  Physical Examination Filed Vitals:   03/18/15 1521  BP: 136/47  Pulse: 75  Temp: 97.8 F (36.6 C)  Resp: 16    LUE: Incision is healed, skin feels warm, hand grip is 5/5, sensation in digits is intact, palpable thrill, bruit can be auscultated , On sonosite: fistula >6 mm throughout most of fistula, ~proximal 1/2 of fistula >1 cm deep, distal 1/2 of fistula looks to be < 6 mm deep  Medical Decision Making  [name] is a 81 y.o. year old female who presents s/p L BC AVF.   The patient's access is ready for use.  Can mark the route of this fistula for patient once nearing need for cannulation.  May need superficialization if difficult to cannulate.  I think there is already enough length for cannulation purposes.  Thank you for allowing [place] to participate in this patient's care.  Leonides Sake, MD [facility] [place] Office: [phone] Pager: [phone]  03/18/2015, 3:40 PM

## 2015-03-24 ENCOUNTER — Encounter (HOSPITAL_COMMUNITY)
Admission: RE | Admit: 2015-03-24 | Discharge: 2015-03-24 | Disposition: A | Discharge status: Home / Self Care | Payer: Medicare Other | Source: Ambulatory Visit | Attending: Nephrology | Admitting: Nephrology

## 2015-03-24 DIAGNOSIS — D631 Anemia in chronic kidney disease: Secondary | ICD-10-CM | POA: Insufficient documentation

## 2015-03-24 DIAGNOSIS — N183 Chronic kidney disease, stage 3 (moderate): Secondary | ICD-10-CM | POA: Insufficient documentation

## 2015-03-24 LAB — RENAL FUNCTION PANEL
Albumin: 3.3 g/dL — ABNORMAL LOW (ref 3.5–5.0)
Anion gap: 10 (ref 5–15)
BUN: 85 mg/dL — ABNORMAL HIGH (ref 6–20)
CALCIUM: 8.9 mg/dL (ref 8.9–10.3)
CHLORIDE: 108 mmol/L (ref 101–111)
CO2: 21 mmol/L — ABNORMAL LOW (ref 22–32)
CREATININE: 4.45 mg/dL — AB (ref 0.44–1.00)
GFR calc Af Amer: 10 mL/min — ABNORMAL LOW (ref >60)
GFR calc non Af Amer: 8 mL/min — ABNORMAL LOW (ref >60)
GLUCOSE: 128 mg/dL — AB (ref 65–99)
Phosphorus: 3.9 mg/dL (ref 2.5–4.6)
Potassium: 4.9 mmol/L (ref 3.5–5.1)
Sodium: 139 mmol/L (ref 135–145)

## 2015-03-24 LAB — IRON AND TIBC
Iron: 42 ug/dL (ref 28–170)
Saturation Ratios: 21 % (ref 10.4–31.8)
TIBC: 197 ug/dL — ABNORMAL LOW (ref 250–450)
UIBC: 155 ug/dL

## 2015-03-24 LAB — FERRITIN: Ferritin: 917 ng/mL — ABNORMAL HIGH (ref 11–307)

## 2015-03-24 LAB — POCT HEMOGLOBIN-HEMACUE: HEMOGLOBIN: 9.1 g/dL — AB (ref 12.0–15.0)

## 2015-03-24 MED ORDER — EPOETIN ALFA 20000 UNIT/ML IJ SOLN
20000.0000 [IU] | INTRAMUSCULAR | Status: DC
  Administered 2015-03-24: 20000 [IU] via SUBCUTANEOUS

## 2015-03-24 MED ORDER — EPOETIN ALFA 20000 UNIT/ML IJ SOLN
INTRAMUSCULAR | Status: AC
  Filled 2015-03-24: qty 1

## 2015-03-31 ENCOUNTER — Encounter (HOSPITAL_COMMUNITY)
Admission: RE | Admit: 2015-03-31 | Discharge: 2015-03-31 | Disposition: A | Discharge status: Home / Self Care | Payer: Medicare Other | Source: Ambulatory Visit | Attending: Nephrology | Admitting: Nephrology

## 2015-03-31 DIAGNOSIS — N183 Chronic kidney disease, stage 3 (moderate): Secondary | ICD-10-CM | POA: Diagnosis not present

## 2015-03-31 DIAGNOSIS — D631 Anemia in chronic kidney disease: Secondary | ICD-10-CM | POA: Diagnosis not present

## 2015-03-31 LAB — POCT HEMOGLOBIN-HEMACUE: Hemoglobin: 9.9 g/dL — ABNORMAL LOW (ref 12.0–15.0)

## 2015-03-31 MED ORDER — EPOETIN ALFA 20000 UNIT/ML IJ SOLN
INTRAMUSCULAR | Status: AC
  Filled 2015-03-31: qty 1

## 2015-03-31 MED ORDER — EPOETIN ALFA 20000 UNIT/ML IJ SOLN
20000.0000 [IU] | INTRAMUSCULAR | Status: DC
  Administered 2015-03-31: 20000 [IU] via SUBCUTANEOUS

## 2015-04-01 DIAGNOSIS — Z0001 Encounter for general adult medical examination with abnormal findings: Secondary | ICD-10-CM | POA: Diagnosis not present

## 2015-04-01 DIAGNOSIS — E1129 Type 2 diabetes mellitus with other diabetic kidney complication: Secondary | ICD-10-CM | POA: Diagnosis not present

## 2015-04-01 DIAGNOSIS — S81809D Unspecified open wound, unspecified lower leg, subsequent encounter: Secondary | ICD-10-CM | POA: Diagnosis not present

## 2015-04-01 DIAGNOSIS — Z1389 Encounter for screening for other disorder: Secondary | ICD-10-CM | POA: Diagnosis not present

## 2015-04-01 DIAGNOSIS — E6609 Other obesity due to excess calories: Secondary | ICD-10-CM | POA: Diagnosis not present

## 2015-04-01 DIAGNOSIS — Z6834 Body mass index (BMI) 34.0-34.9, adult: Secondary | ICD-10-CM | POA: Diagnosis not present

## 2015-04-07 ENCOUNTER — Encounter (HOSPITAL_COMMUNITY)
Admission: RE | Admit: 2015-04-07 | Discharge: 2015-04-07 | Disposition: A | Discharge status: Home / Self Care | Payer: Medicare Other | Source: Ambulatory Visit | Attending: Nephrology | Admitting: Nephrology

## 2015-04-07 DIAGNOSIS — N183 Chronic kidney disease, stage 3 (moderate): Secondary | ICD-10-CM | POA: Diagnosis not present

## 2015-04-07 DIAGNOSIS — D631 Anemia in chronic kidney disease: Secondary | ICD-10-CM | POA: Diagnosis not present

## 2015-04-07 LAB — POCT HEMOGLOBIN-HEMACUE: Hemoglobin: 8.8 g/dL — ABNORMAL LOW (ref 12.0–15.0)

## 2015-04-07 MED ORDER — EPOETIN ALFA 20000 UNIT/ML IJ SOLN
20000.0000 [IU] | INTRAMUSCULAR | Status: DC
  Administered 2015-04-07: 20000 [IU] via SUBCUTANEOUS

## 2015-04-07 MED ORDER — EPOETIN ALFA 20000 UNIT/ML IJ SOLN
INTRAMUSCULAR | Status: AC
  Filled 2015-04-07: qty 1

## 2015-04-14 ENCOUNTER — Encounter (HOSPITAL_COMMUNITY)
Admission: RE | Admit: 2015-04-14 | Discharge: 2015-04-14 | Disposition: A | Discharge status: Home / Self Care | Payer: Medicare Other | Source: Ambulatory Visit | Attending: Nephrology | Admitting: Nephrology

## 2015-04-14 DIAGNOSIS — N183 Chronic kidney disease, stage 3 (moderate): Secondary | ICD-10-CM | POA: Diagnosis not present

## 2015-04-14 DIAGNOSIS — D631 Anemia in chronic kidney disease: Secondary | ICD-10-CM | POA: Diagnosis not present

## 2015-04-14 LAB — POCT HEMOGLOBIN-HEMACUE: Hemoglobin: 8.4 g/dL — ABNORMAL LOW (ref 12.0–15.0)

## 2015-04-14 MED ORDER — EPOETIN ALFA 20000 UNIT/ML IJ SOLN
20000.0000 [IU] | INTRAMUSCULAR | Status: DC
  Administered 2015-04-14: 20000 [IU] via SUBCUTANEOUS

## 2015-04-14 MED ORDER — EPOETIN ALFA 20000 UNIT/ML IJ SOLN
INTRAMUSCULAR | Status: AC
Start: 2015-04-14 — End: 2015-04-14
  Administered 2015-04-14: 20000 [IU] via SUBCUTANEOUS
  Filled 2015-04-14: qty 1

## 2015-04-21 ENCOUNTER — Encounter (HOSPITAL_COMMUNITY)
Admission: RE | Admit: 2015-04-21 | Discharge: 2015-04-21 | Disposition: A | Discharge status: Home / Self Care | Payer: Medicare Other | Source: Ambulatory Visit | Attending: Nephrology | Admitting: Nephrology

## 2015-04-21 DIAGNOSIS — D631 Anemia in chronic kidney disease: Secondary | ICD-10-CM | POA: Diagnosis not present

## 2015-04-21 DIAGNOSIS — N183 Chronic kidney disease, stage 3 (moderate): Secondary | ICD-10-CM | POA: Diagnosis not present

## 2015-04-21 LAB — RENAL FUNCTION PANEL
ALBUMIN: 3.3 g/dL — AB (ref 3.5–5.0)
ANION GAP: 11 (ref 5–15)
BUN: 80 mg/dL — ABNORMAL HIGH (ref 6–20)
CHLORIDE: 109 mmol/L (ref 101–111)
CO2: 20 mmol/L — ABNORMAL LOW (ref 22–32)
CREATININE: 4.29 mg/dL — AB (ref 0.44–1.00)
Calcium: 8.6 mg/dL — ABNORMAL LOW (ref 8.9–10.3)
GFR calc Af Amer: 10 mL/min — ABNORMAL LOW (ref >60)
GFR calc non Af Amer: 9 mL/min — ABNORMAL LOW (ref >60)
Glucose, Bld: 179 mg/dL — ABNORMAL HIGH (ref 65–99)
POTASSIUM: 5.3 mmol/L — AB (ref 3.5–5.1)
Phosphorus: 3.5 mg/dL (ref 2.5–4.6)
Sodium: 140 mmol/L (ref 135–145)

## 2015-04-21 LAB — IRON AND TIBC
Iron: 26 ug/dL — ABNORMAL LOW (ref 28–170)
Saturation Ratios: 13 % (ref 10.4–31.8)
TIBC: 203 ug/dL — ABNORMAL LOW (ref 250–450)
UIBC: 177 ug/dL

## 2015-04-21 LAB — FERRITIN: Ferritin: 835 ng/mL — ABNORMAL HIGH (ref 11–307)

## 2015-04-21 LAB — POCT HEMOGLOBIN-HEMACUE: Hemoglobin: 8.6 g/dL — ABNORMAL LOW (ref 12.0–15.0)

## 2015-04-21 MED ORDER — EPOETIN ALFA 20000 UNIT/ML IJ SOLN: 20000.0000 [IU] | INTRAMUSCULAR | Status: DC

## 2015-04-21 MED ORDER — EPOETIN ALFA 20000 UNIT/ML IJ SOLN
INTRAMUSCULAR | Status: AC
  Administered 2015-04-21: 20000 [IU] via SUBCUTANEOUS
  Filled 2015-04-21: qty 1

## 2015-04-27 ENCOUNTER — Other Ambulatory Visit (HOSPITAL_COMMUNITY): Payer: Self-pay | Admitting: *Deleted

## 2015-04-28 ENCOUNTER — Encounter (HOSPITAL_COMMUNITY)
Admission: RE | Admit: 2015-04-28 | Discharge: 2015-04-28 | Disposition: A | Discharge status: Home / Self Care | Payer: Medicare Other | Source: Ambulatory Visit | Attending: Nephrology | Admitting: Nephrology

## 2015-04-28 DIAGNOSIS — D631 Anemia in chronic kidney disease: Secondary | ICD-10-CM | POA: Diagnosis not present

## 2015-04-28 DIAGNOSIS — N183 Chronic kidney disease, stage 3 (moderate): Secondary | ICD-10-CM | POA: Diagnosis not present

## 2015-04-28 LAB — POCT HEMOGLOBIN-HEMACUE: Hemoglobin: 8.5 g/dL — ABNORMAL LOW (ref 12.0–15.0)

## 2015-04-28 MED ORDER — EPOETIN ALFA 20000 UNIT/ML IJ SOLN
INTRAMUSCULAR | Status: AC
  Filled 2015-04-28: qty 1

## 2015-04-28 MED ORDER — SODIUM CHLORIDE 0.9 % IV SOLN
510.0000 mg | INTRAVENOUS | Status: DC
  Administered 2015-04-28: 510 mg via INTRAVENOUS
  Filled 2015-04-28: qty 17

## 2015-04-28 MED ORDER — EPOETIN ALFA 20000 UNIT/ML IJ SOLN
20000.0000 [IU] | INTRAMUSCULAR | Status: DC
  Administered 2015-04-28: 20000 [IU] via SUBCUTANEOUS

## 2015-04-29 ENCOUNTER — Encounter (HOSPITAL_COMMUNITY): Payer: Medicare Other

## 2015-05-04 ENCOUNTER — Other Ambulatory Visit (HOSPITAL_COMMUNITY): Payer: Self-pay | Admitting: *Deleted

## 2015-05-05 ENCOUNTER — Encounter (HOSPITAL_COMMUNITY)
Admission: RE | Admit: 2015-05-05 | Discharge: 2015-05-05 | Disposition: A | Discharge status: Home / Self Care | Payer: Medicare Other | Source: Ambulatory Visit | Attending: Nephrology | Admitting: Nephrology

## 2015-05-05 DIAGNOSIS — N183 Chronic kidney disease, stage 3 (moderate): Secondary | ICD-10-CM | POA: Diagnosis not present

## 2015-05-05 DIAGNOSIS — D631 Anemia in chronic kidney disease: Secondary | ICD-10-CM | POA: Diagnosis not present

## 2015-05-05 MED ORDER — EPOETIN ALFA 20000 UNIT/ML IJ SOLN
INTRAMUSCULAR | Status: AC
  Administered 2015-05-05: 20000 [IU]
  Filled 2015-05-05: qty 1

## 2015-05-05 MED ORDER — SODIUM CHLORIDE 0.9 % IV SOLN
510.0000 mg | INTRAVENOUS | Status: AC
  Administered 2015-05-05: 510 mg via INTRAVENOUS
  Filled 2015-05-05: qty 17

## 2015-05-05 MED ORDER — EPOETIN ALFA 20000 UNIT/ML IJ SOLN: 20000.0000 [IU] | INTRAMUSCULAR | Status: DC

## 2015-05-06 LAB — POCT HEMOGLOBIN-HEMACUE: HEMOGLOBIN: 9 g/dL — AB (ref 12.0–15.0)

## 2015-05-11 ENCOUNTER — Other Ambulatory Visit (HOSPITAL_COMMUNITY): Payer: Self-pay | Admitting: *Deleted

## 2015-05-12 ENCOUNTER — Encounter (HOSPITAL_COMMUNITY)
Admission: RE | Admit: 2015-05-12 | Discharge: 2015-05-12 | Disposition: A | Discharge status: Home / Self Care | Payer: Medicare Other | Source: Ambulatory Visit | Attending: Nephrology | Admitting: Nephrology

## 2015-05-12 DIAGNOSIS — D631 Anemia in chronic kidney disease: Secondary | ICD-10-CM | POA: Diagnosis not present

## 2015-05-12 DIAGNOSIS — N183 Chronic kidney disease, stage 3 (moderate): Secondary | ICD-10-CM | POA: Diagnosis not present

## 2015-05-12 MED ORDER — EPOETIN ALFA 20000 UNIT/ML IJ SOLN
20000.0000 [IU] | INTRAMUSCULAR | Status: DC
  Administered 2015-05-12: 20000 [IU] via SUBCUTANEOUS

## 2015-05-12 MED ORDER — EPOETIN ALFA 20000 UNIT/ML IJ SOLN
INTRAMUSCULAR | Status: DC
Start: 2015-05-12 — End: 2015-05-13
  Filled 2015-05-12: qty 1

## 2015-05-13 LAB — POCT HEMOGLOBIN-HEMACUE: HEMOGLOBIN: 9.7 g/dL — AB (ref 12.0–15.0)

## 2015-05-19 ENCOUNTER — Encounter (HOSPITAL_COMMUNITY)
Admission: RE | Admit: 2015-05-19 | Discharge: 2015-05-19 | Disposition: A | Discharge status: Home / Self Care | Payer: Medicare Other | Source: Ambulatory Visit | Attending: Nephrology | Admitting: Nephrology

## 2015-05-19 DIAGNOSIS — N183 Chronic kidney disease, stage 3 (moderate): Secondary | ICD-10-CM | POA: Diagnosis not present

## 2015-05-19 DIAGNOSIS — D631 Anemia in chronic kidney disease: Secondary | ICD-10-CM | POA: Diagnosis not present

## 2015-05-19 LAB — RENAL FUNCTION PANEL
ANION GAP: 10 (ref 5–15)
Albumin: 3.2 g/dL — ABNORMAL LOW (ref 3.5–5.0)
BUN: 77 mg/dL — ABNORMAL HIGH (ref 6–20)
CALCIUM: 8.8 mg/dL — AB (ref 8.9–10.3)
CHLORIDE: 107 mmol/L (ref 101–111)
CO2: 21 mmol/L — AB (ref 22–32)
Creatinine, Ser: 4.47 mg/dL — ABNORMAL HIGH (ref 0.44–1.00)
GFR calc non Af Amer: 8 mL/min — ABNORMAL LOW (ref >60)
GFR, EST AFRICAN AMERICAN: 10 mL/min — AB (ref >60)
GLUCOSE: 146 mg/dL — AB (ref 65–99)
POTASSIUM: 4.8 mmol/L (ref 3.5–5.1)
Phosphorus: 4.2 mg/dL (ref 2.5–4.6)
Sodium: 138 mmol/L (ref 135–145)

## 2015-05-19 LAB — IRON AND TIBC
Iron: 32 ug/dL (ref 28–170)
SATURATION RATIOS: 17 % (ref 10.4–31.8)
TIBC: 185 ug/dL — AB (ref 250–450)
UIBC: 153 ug/dL

## 2015-05-19 LAB — POCT HEMOGLOBIN-HEMACUE: HEMOGLOBIN: 8.8 g/dL — AB (ref 12.0–15.0)

## 2015-05-19 LAB — FERRITIN: Ferritin: 770 ng/mL — ABNORMAL HIGH (ref 11–307)

## 2015-05-19 MED ORDER — EPOETIN ALFA 20000 UNIT/ML IJ SOLN
20000.0000 [IU] | INTRAMUSCULAR | Status: DC
  Administered 2015-05-19: 20000 [IU] via SUBCUTANEOUS

## 2015-05-19 MED ORDER — EPOETIN ALFA 20000 UNIT/ML IJ SOLN
INTRAMUSCULAR | Status: AC
  Administered 2015-05-19: 20000 [IU] via SUBCUTANEOUS
  Filled 2015-05-19: qty 1

## 2015-05-26 ENCOUNTER — Encounter (HOSPITAL_COMMUNITY)
Admission: RE | Admit: 2015-05-26 | Discharge: 2015-05-26 | Disposition: A | Discharge status: Home / Self Care | Payer: Medicare Other | Source: Ambulatory Visit | Attending: Nephrology | Admitting: Nephrology

## 2015-05-26 DIAGNOSIS — N183 Chronic kidney disease, stage 3 (moderate): Secondary | ICD-10-CM | POA: Diagnosis not present

## 2015-05-26 DIAGNOSIS — D631 Anemia in chronic kidney disease: Secondary | ICD-10-CM | POA: Diagnosis not present

## 2015-05-26 LAB — POCT HEMOGLOBIN-HEMACUE: Hemoglobin: 9.3 g/dL — ABNORMAL LOW (ref 12.0–15.0)

## 2015-05-26 MED ORDER — EPOETIN ALFA 20000 UNIT/ML IJ SOLN
20000.0000 [IU] | INTRAMUSCULAR | Status: DC
  Administered 2015-05-26: 20000 [IU] via SUBCUTANEOUS

## 2015-05-26 MED ORDER — EPOETIN ALFA 20000 UNIT/ML IJ SOLN
INTRAMUSCULAR | Status: AC
  Filled 2015-05-26: qty 1

## 2015-06-02 ENCOUNTER — Encounter (HOSPITAL_COMMUNITY)
Admission: RE | Admit: 2015-06-02 | Discharge: 2015-06-02 | Disposition: A | Discharge status: Home / Self Care | Payer: Medicare Other | Source: Ambulatory Visit | Attending: Nephrology | Admitting: Nephrology

## 2015-06-02 DIAGNOSIS — N183 Chronic kidney disease, stage 3 (moderate): Secondary | ICD-10-CM | POA: Diagnosis not present

## 2015-06-02 DIAGNOSIS — Z79899 Other long term (current) drug therapy: Secondary | ICD-10-CM | POA: Diagnosis not present

## 2015-06-02 DIAGNOSIS — Z5181 Encounter for therapeutic drug level monitoring: Secondary | ICD-10-CM | POA: Insufficient documentation

## 2015-06-02 DIAGNOSIS — D631 Anemia in chronic kidney disease: Secondary | ICD-10-CM | POA: Diagnosis not present

## 2015-06-02 LAB — POCT HEMOGLOBIN-HEMACUE: HEMOGLOBIN: 9.7 g/dL — AB (ref 12.0–15.0)

## 2015-06-02 MED ORDER — EPOETIN ALFA 20000 UNIT/ML IJ SOLN
20000.0000 [IU] | INTRAMUSCULAR | Status: DC
  Administered 2015-06-02: 20000 [IU] via SUBCUTANEOUS

## 2015-06-09 ENCOUNTER — Encounter (HOSPITAL_COMMUNITY)
Admission: RE | Admit: 2015-06-09 | Discharge: 2015-06-09 | Disposition: A | Discharge status: Home / Self Care | Payer: Medicare Other | Source: Ambulatory Visit | Attending: Nephrology | Admitting: Nephrology

## 2015-06-09 DIAGNOSIS — N183 Chronic kidney disease, stage 3 (moderate): Secondary | ICD-10-CM | POA: Diagnosis not present

## 2015-06-09 DIAGNOSIS — D631 Anemia in chronic kidney disease: Secondary | ICD-10-CM | POA: Insufficient documentation

## 2015-06-09 LAB — POCT HEMOGLOBIN-HEMACUE: HEMOGLOBIN: 9.4 g/dL — AB (ref 12.0–15.0)

## 2015-06-09 MED ORDER — EPOETIN ALFA 20000 UNIT/ML IJ SOLN
20000.0000 [IU] | INTRAMUSCULAR | Status: DC
  Administered 2015-06-09: 20000 [IU] via SUBCUTANEOUS

## 2015-06-09 MED ORDER — EPOETIN ALFA 20000 UNIT/ML IJ SOLN
INTRAMUSCULAR | Status: AC
  Administered 2015-06-09: 20000 [IU] via SUBCUTANEOUS
  Filled 2015-06-09: qty 1

## 2015-06-16 ENCOUNTER — Encounter (HOSPITAL_COMMUNITY)
Admission: RE | Admit: 2015-06-16 | Discharge: 2015-06-16 | Disposition: A | Discharge status: Home / Self Care | Payer: Medicare Other | Source: Ambulatory Visit | Attending: Nephrology | Admitting: Nephrology

## 2015-06-16 DIAGNOSIS — D631 Anemia in chronic kidney disease: Secondary | ICD-10-CM | POA: Insufficient documentation

## 2015-06-16 DIAGNOSIS — N183 Chronic kidney disease, stage 3 (moderate): Secondary | ICD-10-CM | POA: Diagnosis not present

## 2015-06-16 LAB — RENAL FUNCTION PANEL
ALBUMIN: 3.2 g/dL — AB (ref 3.5–5.0)
ANION GAP: 9 (ref 5–15)
BUN: 78 mg/dL — ABNORMAL HIGH (ref 6–20)
CO2: 22 mmol/L (ref 22–32)
Calcium: 8.5 mg/dL — ABNORMAL LOW (ref 8.9–10.3)
Chloride: 107 mmol/L (ref 101–111)
Creatinine, Ser: 4.58 mg/dL — ABNORMAL HIGH (ref 0.44–1.00)
GFR calc Af Amer: 9 mL/min — ABNORMAL LOW (ref >60)
GFR, EST NON AFRICAN AMERICAN: 8 mL/min — AB (ref >60)
Glucose, Bld: 123 mg/dL — ABNORMAL HIGH (ref 65–99)
PHOSPHORUS: 4.7 mg/dL — AB (ref 2.5–4.6)
POTASSIUM: 4.3 mmol/L (ref 3.5–5.1)
Sodium: 138 mmol/L (ref 135–145)

## 2015-06-16 LAB — FERRITIN: Ferritin: 886 ng/mL — ABNORMAL HIGH (ref 11–307)

## 2015-06-16 LAB — POCT HEMOGLOBIN-HEMACUE: Hemoglobin: 9.6 g/dL — ABNORMAL LOW (ref 12.0–15.0)

## 2015-06-16 LAB — IRON AND TIBC
IRON: 35 ug/dL (ref 28–170)
SATURATION RATIOS: 18 % (ref 10.4–31.8)
TIBC: 197 ug/dL — ABNORMAL LOW (ref 250–450)
UIBC: 162 ug/dL

## 2015-06-16 MED ORDER — EPOETIN ALFA 20000 UNIT/ML IJ SOLN
INTRAMUSCULAR | Status: AC
  Filled 2015-06-16: qty 1

## 2015-06-16 MED ORDER — EPOETIN ALFA 20000 UNIT/ML IJ SOLN
20000.0000 [IU] | INTRAMUSCULAR | Status: DC
  Administered 2015-06-16: 20000 [IU] via SUBCUTANEOUS

## 2015-06-23 ENCOUNTER — Encounter (HOSPITAL_COMMUNITY)
Admission: RE | Admit: 2015-06-23 | Discharge: 2015-06-23 | Disposition: A | Discharge status: Home / Self Care | Payer: Medicare Other | Source: Ambulatory Visit | Attending: Nephrology | Admitting: Nephrology

## 2015-06-23 DIAGNOSIS — N183 Chronic kidney disease, stage 3 (moderate): Secondary | ICD-10-CM | POA: Diagnosis not present

## 2015-06-23 DIAGNOSIS — D631 Anemia in chronic kidney disease: Secondary | ICD-10-CM | POA: Diagnosis not present

## 2015-06-23 LAB — POCT HEMOGLOBIN-HEMACUE: HEMOGLOBIN: 10.4 g/dL — AB (ref 12.0–15.0)

## 2015-06-23 MED ORDER — EPOETIN ALFA 20000 UNIT/ML IJ SOLN
20000.0000 [IU] | INTRAMUSCULAR | Status: DC
  Administered 2015-06-23: 20000 [IU] via SUBCUTANEOUS

## 2015-06-23 MED ORDER — EPOETIN ALFA 20000 UNIT/ML IJ SOLN
INTRAMUSCULAR | Status: AC
  Administered 2015-06-23: 20000 [IU] via SUBCUTANEOUS
  Filled 2015-06-23: qty 1

## 2015-06-30 ENCOUNTER — Encounter (HOSPITAL_COMMUNITY)
Admission: RE | Admit: 2015-06-30 | Discharge: 2015-06-30 | Disposition: A | Discharge status: Home / Self Care | Payer: Medicare Other | Source: Ambulatory Visit | Attending: Nephrology | Admitting: Nephrology

## 2015-06-30 DIAGNOSIS — D631 Anemia in chronic kidney disease: Secondary | ICD-10-CM | POA: Diagnosis not present

## 2015-06-30 DIAGNOSIS — N183 Chronic kidney disease, stage 3 (moderate): Secondary | ICD-10-CM | POA: Diagnosis not present

## 2015-06-30 LAB — POCT HEMOGLOBIN-HEMACUE: Hemoglobin: 9.4 g/dL — ABNORMAL LOW (ref 12.0–15.0)

## 2015-06-30 MED ORDER — EPOETIN ALFA 20000 UNIT/ML IJ SOLN
INTRAMUSCULAR | Status: AC
  Filled 2015-06-30: qty 1

## 2015-06-30 MED ORDER — EPOETIN ALFA 20000 UNIT/ML IJ SOLN
20000.0000 [IU] | INTRAMUSCULAR | Status: DC
  Administered 2015-06-30: 20000 [IU] via SUBCUTANEOUS

## 2015-07-06 DIAGNOSIS — N189 Chronic kidney disease, unspecified: Secondary | ICD-10-CM | POA: Diagnosis not present

## 2015-07-06 DIAGNOSIS — N184 Chronic kidney disease, stage 4 (severe): Secondary | ICD-10-CM | POA: Diagnosis not present

## 2015-07-06 DIAGNOSIS — D631 Anemia in chronic kidney disease: Secondary | ICD-10-CM | POA: Diagnosis not present

## 2015-07-06 DIAGNOSIS — E1129 Type 2 diabetes mellitus with other diabetic kidney complication: Secondary | ICD-10-CM | POA: Diagnosis not present

## 2015-07-06 DIAGNOSIS — N2581 Secondary hyperparathyroidism of renal origin: Secondary | ICD-10-CM | POA: Diagnosis not present

## 2015-07-06 DIAGNOSIS — R809 Proteinuria, unspecified: Secondary | ICD-10-CM | POA: Diagnosis not present

## 2015-07-14 ENCOUNTER — Encounter (HOSPITAL_COMMUNITY)
Admission: RE | Admit: 2015-07-14 | Discharge: 2015-07-14 | Disposition: A | Discharge status: Home / Self Care | Payer: Medicare Other | Source: Ambulatory Visit | Attending: Nephrology | Admitting: Nephrology

## 2015-07-14 DIAGNOSIS — N183 Chronic kidney disease, stage 3 (moderate): Secondary | ICD-10-CM | POA: Insufficient documentation

## 2015-07-14 DIAGNOSIS — D631 Anemia in chronic kidney disease: Secondary | ICD-10-CM | POA: Insufficient documentation

## 2015-07-14 LAB — RENAL FUNCTION PANEL
Albumin: 3.1 g/dL — ABNORMAL LOW (ref 3.5–5.0)
Anion gap: 12 (ref 5–15)
BUN: 85 mg/dL — ABNORMAL HIGH (ref 6–20)
CALCIUM: 8.7 mg/dL — AB (ref 8.9–10.3)
CO2: 20 mmol/L — AB (ref 22–32)
CREATININE: 4.77 mg/dL — AB (ref 0.44–1.00)
Chloride: 107 mmol/L (ref 101–111)
GFR calc Af Amer: 9 mL/min — ABNORMAL LOW (ref >60)
GFR calc non Af Amer: 8 mL/min — ABNORMAL LOW (ref >60)
GLUCOSE: 116 mg/dL — AB (ref 65–99)
Phosphorus: 4.6 mg/dL (ref 2.5–4.6)
Potassium: 4.4 mmol/L (ref 3.5–5.1)
SODIUM: 139 mmol/L (ref 135–145)

## 2015-07-14 LAB — IRON AND TIBC
Iron: 75 ug/dL (ref 28–170)
Saturation Ratios: 36 % — ABNORMAL HIGH (ref 10.4–31.8)
TIBC: 206 ug/dL — ABNORMAL LOW (ref 250–450)
UIBC: 131 ug/dL

## 2015-07-14 LAB — POCT HEMOGLOBIN-HEMACUE: HEMOGLOBIN: 10.2 g/dL — AB (ref 12.0–15.0)

## 2015-07-14 LAB — FERRITIN: Ferritin: 884 ng/mL — ABNORMAL HIGH (ref 11–307)

## 2015-07-14 MED ORDER — EPOETIN ALFA 20000 UNIT/ML IJ SOLN
20000.0000 [IU] | INTRAMUSCULAR | Status: DC
  Administered 2015-07-14: 20000 [IU] via SUBCUTANEOUS

## 2015-07-14 MED ORDER — EPOETIN ALFA 20000 UNIT/ML IJ SOLN
INTRAMUSCULAR | Status: AC
  Administered 2015-07-14: 20000 [IU] via SUBCUTANEOUS
  Filled 2015-07-14: qty 1

## 2015-07-21 ENCOUNTER — Encounter (HOSPITAL_COMMUNITY)
Admission: RE | Admit: 2015-07-21 | Discharge: 2015-07-21 | Disposition: A | Discharge status: Home / Self Care | Payer: Medicare Other | Source: Ambulatory Visit | Attending: Nephrology | Admitting: Nephrology

## 2015-07-21 DIAGNOSIS — D631 Anemia in chronic kidney disease: Secondary | ICD-10-CM | POA: Diagnosis not present

## 2015-07-21 DIAGNOSIS — N183 Chronic kidney disease, stage 3 (moderate): Secondary | ICD-10-CM | POA: Diagnosis not present

## 2015-07-21 LAB — POCT HEMOGLOBIN-HEMACUE: HEMOGLOBIN: 9.5 g/dL — AB (ref 12.0–15.0)

## 2015-07-21 MED ORDER — EPOETIN ALFA 20000 UNIT/ML IJ SOLN
INTRAMUSCULAR | Status: AC
  Filled 2015-07-21: qty 1

## 2015-07-21 MED ORDER — EPOETIN ALFA 20000 UNIT/ML IJ SOLN
20000.0000 [IU] | INTRAMUSCULAR | Status: DC
  Administered 2015-07-21: 20000 [IU] via SUBCUTANEOUS

## 2015-07-27 DIAGNOSIS — Z1389 Encounter for screening for other disorder: Secondary | ICD-10-CM | POA: Diagnosis not present

## 2015-07-27 DIAGNOSIS — L98499 Non-pressure chronic ulcer of skin of other sites with unspecified severity: Secondary | ICD-10-CM | POA: Diagnosis not present

## 2015-07-27 DIAGNOSIS — Z6834 Body mass index (BMI) 34.0-34.9, adult: Secondary | ICD-10-CM | POA: Diagnosis not present

## 2015-07-28 ENCOUNTER — Encounter (HOSPITAL_COMMUNITY)
Admission: RE | Admit: 2015-07-28 | Discharge: 2015-07-28 | Disposition: A | Discharge status: Home / Self Care | Payer: Medicare Other | Source: Ambulatory Visit | Attending: Nephrology | Admitting: Nephrology

## 2015-07-28 DIAGNOSIS — N183 Chronic kidney disease, stage 3 (moderate): Secondary | ICD-10-CM | POA: Diagnosis not present

## 2015-07-28 DIAGNOSIS — D631 Anemia in chronic kidney disease: Secondary | ICD-10-CM | POA: Diagnosis not present

## 2015-07-28 LAB — POCT HEMOGLOBIN-HEMACUE: HEMOGLOBIN: 9.7 g/dL — AB (ref 12.0–15.0)

## 2015-07-28 MED ORDER — EPOETIN ALFA 20000 UNIT/ML IJ SOLN
20000.0000 [IU] | INTRAMUSCULAR | Status: DC
  Administered 2015-07-28: 20000 [IU] via SUBCUTANEOUS

## 2015-07-28 MED ORDER — EPOETIN ALFA 20000 UNIT/ML IJ SOLN
INTRAMUSCULAR | Status: AC
  Filled 2015-07-28: qty 1

## 2015-08-02 ENCOUNTER — Encounter (HOSPITAL_COMMUNITY): Payer: Medicare Other

## 2015-08-02 ENCOUNTER — Encounter (HOSPITAL_COMMUNITY)
Admission: RE | Admit: 2015-08-02 | Discharge: 2015-08-02 | Disposition: A | Discharge status: Home / Self Care | Payer: Medicare Other | Source: Ambulatory Visit | Attending: Nephrology | Admitting: Nephrology

## 2015-08-02 DIAGNOSIS — D631 Anemia in chronic kidney disease: Secondary | ICD-10-CM | POA: Diagnosis not present

## 2015-08-02 DIAGNOSIS — N183 Chronic kidney disease, stage 3 (moderate): Secondary | ICD-10-CM | POA: Diagnosis not present

## 2015-08-02 LAB — POCT HEMOGLOBIN-HEMACUE: Hemoglobin: 9.5 g/dL — ABNORMAL LOW (ref 12.0–15.0)

## 2015-08-02 MED ORDER — EPOETIN ALFA 20000 UNIT/ML IJ SOLN
20000.0000 [IU] | INTRAMUSCULAR | Status: DC
  Administered 2015-08-02: 20000 [IU] via SUBCUTANEOUS

## 2015-08-02 MED ORDER — EPOETIN ALFA 20000 UNIT/ML IJ SOLN
INTRAMUSCULAR | Status: AC
  Filled 2015-08-02: qty 1

## 2015-08-05 ENCOUNTER — Encounter (HOSPITAL_COMMUNITY): Payer: Medicare Other

## 2015-08-08 ENCOUNTER — Other Ambulatory Visit (HOSPITAL_COMMUNITY): Payer: Self-pay | Admitting: *Deleted

## 2015-08-08 ENCOUNTER — Ambulatory Visit (HOSPITAL_COMMUNITY): Payer: Medicare Other | Attending: Family Medicine | Admitting: Physical Therapy

## 2015-08-08 DIAGNOSIS — X58XXXD Exposure to other specified factors, subsequent encounter: Secondary | ICD-10-CM | POA: Diagnosis not present

## 2015-08-08 DIAGNOSIS — R262 Difficulty in walking, not elsewhere classified: Secondary | ICD-10-CM | POA: Insufficient documentation

## 2015-08-08 DIAGNOSIS — S91109D Unspecified open wound of unspecified toe(s) without damage to nail, subsequent encounter: Secondary | ICD-10-CM | POA: Diagnosis not present

## 2015-08-08 NOTE — Therapy (Signed)
[facility] [address] [place], [place], [address] Phone: [phone]   Fax:  [phone]  Wound Care Evaluation  Patient Details  Name: [name] MRN: [id number] Date of Birth: 1931/10/23 No Data Recorded  Encounter Date: 08/08/2015      PT End of Session - 08/08/15 1249    Visit Number 1   Number of Visits 16   Date for PT Re-Evaluation 09/05/15   Authorization Type UHC Medicare and medicaid    Authorization Time Period 08/08/15 to 10/08/15   Authorization - Visit Number 1   Authorization - Number of Visits 10   PT Start Time 1142   PT Stop Time 1224   PT Time Calculation (min) 42 min   Activity Tolerance Patient tolerated treatment well   Behavior During Therapy [facility] for tasks assessed/performed      Past Medical History  Diagnosis Date  . CHF (congestive heart failure)      preserved left ventricular systolic function  . Hypertension   . Overweight(278.02)   . Hypothyroid   . Vaginal pessary present ~ 2012  . CKD (chronic kidney disease) stage 4, GFR 15-29 ml/min     [name] 02/16/2013  . Anemia associated with chronic renal failure     ESA therapy  . History of blood transfusion 1975  . Rheumatoid arthritis   . Osteoporosis   . Pneumonia 02/2011    [name] 02/20/2011 (02/16/2013)  . Diabetic foot ulcers     "get them on borh feet" (02/16/2013)  . Shortness of breath dyspnea     with exertion  . Type II diabetes mellitus     patient denies    Past Surgical History  Procedure Laterality Date  . Cataract extraction w/ intraocular lens  implant, bilateral Bilateral 1990's  . Orif femur fracture Left 1975    "got a rod and screw in it" (02/16/2013)  . Av fistula placement Left 01/26/2015    Procedure: LEFT ARM ARTERIOVENOUS (AV) FISTULA CREATION;  Surgeon: [name], MD;  Location: [facility] OR;  Service: Vascular;  Laterality: Left;    There were no vitals filed for this visit.  Visit Diagnosis:  Open toe wound,  subsequent encounter - Plan: PT plan of care cert/re-cert  Difficulty walking - Plan: PT plan of care cert/re-cert         Wound Therapy - 08/08/15 1234    Subjective Patient reports that the wound started when her foot was rubbed by a shoe, she cannot remember when. She had been treating it herself but ran out of the salve the MD gave her, after which MD sent her to formal wound care anyway.    Patient and Family Stated Goals get all wounds healed up    Date of Onset --  patient cannot remember    Prior Treatments self treat   Pain Assessment No/denies pain   Evaluation and Treatment Procedures Explained to Patient/Family Yes   Evaluation and Treatment Procedures agreed to   Wound Properties Date First Assessed: 08/08/15 Location: Toe (Comment  which one) Location Orientation: Left;Other (Comment) , 2nd toe   Wound Description (Comments): 2nd toe  Present on Admission: Yes   Dressing Type Gauze (Comment)   Dressing Changed New   Dressing Status Old drainage   Dressing Change Frequency PRN   Site / Wound Assessment Yellow;Granulation tissue   % Wound base Red or Granulating 10%   % Wound base Yellow 90%   % Wound base  Black 0%   Peri-wound Assessment Intact;Edema   Wound Length (cm) 2.2 cm   Wound Width (cm) 1.2 cm   Wound Depth (cm) --  less than 0.1cm    Margins Epibole (rolled edges)   Closure None   Drainage Amount Minimal   Drainage Description Serosanguineous   Treatment Cleansed;Debridement (Selective);Packing (Impregnated strip);Tape changed  debridement, honey, gauze, tape    Wound Properties Date First Assessed: 2015/08/27 Location: Toe (Comment  which one) Location Orientation: Left Wound Description (Comments): 3rd toe left  Present on Admission: Yes   Dressing Type None   Dressing Changed New   Dressing Status None   Dressing Change Frequency PRN   Site / Wound Assessment Yellow   % Wound base Red or Granulating 0%   % Wound base Yellow 100%   % Wound base  Black 0%   Wound Length (cm) 0.7 cm   Wound Width (cm) 0.5 cm   Wound Depth (cm) --  less than 0.1cm    Margins Epibole (rolled edges)   Closure None   Drainage Amount None   Treatment Cleansed;Debridement (Selective);Packing (Impregnated strip);Other (Comment);Tape changed  honey packing, gauze and bandaid    Selective Debridement - Location 2nd toe L    Selective Debridement - Tools Used Forceps   Selective Debridement - Tissue Removed minimal slough    Wound Therapy - Clinical Statement Patient presents with multiple left toe wounds which were addressed today, and present as chrnoic and non-healing with a considerable amount of slough. Epibole present both toes. Patient cannot remember exactly when she opened up these wounds but reports it was due to a shoe rubbing. Also noted wounds on bilateral anterior shins as well as possible lymphedema signs and requested that front desk send referral to MD so skilled PT services may address these limiting factors as well. At this time patient will benefit from skilled PT services to promote wound healing and minimalize risk of infection.    Wound Therapy - Functional Problem List non-healing wounsd    Factors Delaying/Impairing Wound Healing Altered sensation;Diabetes Mellitus;Polypharmacy;Vascular compromise   Wound Therapy - Frequency Other (comment)  2x/week   Wound Therapy - Current Recommendations PT   Wound Plan honey and gauze,  compression as tolerated   Decrease Necrotic Tissue to 30%   Decrease Necrotic Tissue - Progress Goal set today   Increase Granulation Tissue to 70%   Increase Granulation Tissue - Progress Goal set today   Decrease Length/Width/Depth by (cm) .06cm 2nd toe, full closure 3rd toe    Additional Wound Therapy Goal full wound closure both toes    Additional Wound Therapy Goal - Progress Goal set today   Goals/treatment plan/discharge plan were made with and agreed upon by patient/family Yes   Time For Goal Achievement  Other (comment)  8 weeks    Wound Therapy - Potential for Goals Good                         PT Education - Aug 27, 2015 1249    Education provided Yes   Education Details prognosis, plan of care    Person(s) Educated Patient   Methods Explanation   Comprehension Verbalized understanding                     G-Codes - 08/27/15 1252    Functional Assessment Tool Used Based on wound healing status, presence of slough, general wound healing status, wound size    Functional  Limitation Other PT primary   Other PT Primary Current Status [phone]) At least 60 percent but less than 80 percent impaired, limited or restricted   Other PT Primary Goal Status ([id number]) At least 40 percent but less than 60 percent impaired, limited or restricted      Problem List Patient Active Problem List   Diagnosis Date Noted  . Uterovaginal prolapse, complete 03/26/2013  . Diabetic foot ulcers (HCC) 02/16/2013  . Hyperkalemia 06/24/2012  . Hyperlipidemia 05/20/2012  . Uterine prolapse 05/19/2012  . Chronic kidney disease, stage 4, severely decreased GFR (HCC)   . Hypertension   . Anemia associated with chronic renal failure   . Hypothyroid   . CAROTID BRUIT, LEFT 05/11/2009  . DIABETES MELLITUS 03/11/2009  . OVERWEIGHT/OBESITY 03/11/2009    Nedra Hai PT, DPT [phone]  [facility] [address] [place], [place], [address] Phone: [phone]   Fax:  [phone]  Name: [name] MRN: [id number] Date of Birth: 03-12-1932

## 2015-08-09 ENCOUNTER — Encounter (HOSPITAL_COMMUNITY)
Admission: RE | Admit: 2015-08-09 | Discharge: 2015-08-09 | Disposition: A | Discharge status: Home / Self Care | Payer: Medicare Other | Source: Ambulatory Visit | Attending: Nephrology | Admitting: Nephrology

## 2015-08-09 ENCOUNTER — Ambulatory Visit (HOSPITAL_COMMUNITY): Payer: Medicare Other | Admitting: Physical Therapy

## 2015-08-09 DIAGNOSIS — D631 Anemia in chronic kidney disease: Secondary | ICD-10-CM | POA: Diagnosis not present

## 2015-08-09 DIAGNOSIS — N183 Chronic kidney disease, stage 3 (moderate): Secondary | ICD-10-CM | POA: Diagnosis not present

## 2015-08-09 LAB — POCT HEMOGLOBIN-HEMACUE: Hemoglobin: 9.3 g/dL — ABNORMAL LOW (ref 12.0–15.0)

## 2015-08-09 LAB — RENAL FUNCTION PANEL
ALBUMIN: 2.9 g/dL — AB (ref 3.5–5.0)
ANION GAP: 10 (ref 5–15)
BUN: 92 mg/dL — AB (ref 6–20)
CALCIUM: 8.7 mg/dL — AB (ref 8.9–10.3)
CO2: 20 mmol/L — ABNORMAL LOW (ref 22–32)
Chloride: 108 mmol/L (ref 101–111)
Creatinine, Ser: 4.82 mg/dL — ABNORMAL HIGH (ref 0.44–1.00)
GFR calc Af Amer: 9 mL/min — ABNORMAL LOW (ref >60)
GFR, EST NON AFRICAN AMERICAN: 8 mL/min — AB (ref >60)
GLUCOSE: 171 mg/dL — AB (ref 65–99)
PHOSPHORUS: 4 mg/dL (ref 2.5–4.6)
POTASSIUM: 4.7 mmol/L (ref 3.5–5.1)
SODIUM: 138 mmol/L (ref 135–145)

## 2015-08-09 LAB — IRON AND TIBC
IRON: 31 ug/dL (ref 28–170)
SATURATION RATIOS: 16 % (ref 10.4–31.8)
TIBC: 195 ug/dL — ABNORMAL LOW (ref 250–450)
UIBC: 164 ug/dL

## 2015-08-09 LAB — FERRITIN: Ferritin: 864 ng/mL — ABNORMAL HIGH (ref 11–307)

## 2015-08-09 MED ORDER — EPOETIN ALFA 20000 UNIT/ML IJ SOLN
INTRAMUSCULAR | Status: AC
  Filled 2015-08-09: qty 1

## 2015-08-09 MED ORDER — EPOETIN ALFA 20000 UNIT/ML IJ SOLN
20000.0000 [IU] | INTRAMUSCULAR | Status: DC
  Administered 2015-08-09: 20000 [IU] via SUBCUTANEOUS

## 2015-08-12 ENCOUNTER — Ambulatory Visit (HOSPITAL_COMMUNITY): Payer: Medicare Other | Attending: Family Medicine | Admitting: Physical Therapy

## 2015-08-12 DIAGNOSIS — X58XXXD Exposure to other specified factors, subsequent encounter: Secondary | ICD-10-CM | POA: Insufficient documentation

## 2015-08-12 DIAGNOSIS — R262 Difficulty in walking, not elsewhere classified: Secondary | ICD-10-CM | POA: Diagnosis not present

## 2015-08-12 DIAGNOSIS — S91109D Unspecified open wound of unspecified toe(s) without damage to nail, subsequent encounter: Secondary | ICD-10-CM | POA: Diagnosis not present

## 2015-08-12 DIAGNOSIS — S81801D Unspecified open wound, right lower leg, subsequent encounter: Secondary | ICD-10-CM | POA: Diagnosis not present

## 2015-08-12 DIAGNOSIS — S81802D Unspecified open wound, left lower leg, subsequent encounter: Secondary | ICD-10-CM | POA: Insufficient documentation

## 2015-08-12 NOTE — Therapy (Signed)
[facility] [address] [place], [place], [address] Phone: [phone]   Fax:  [phone]  Wound Care Therapy  Patient Details  Name: [name] MRN: [id number] Date of Birth: 02/20/1932 No Data Recorded  Encounter Date: 08/12/2015      PT End of Session - 08/12/15 1638    Visit Number 2   Number of Visits 16   Date for PT Re-Evaluation 09/05/15   Authorization Type UHC Medicare and medicaid    Authorization Time Period 08/08/15 to 10/08/15   Authorization - Visit Number 2   Authorization - Number of Visits 10   PT Start Time 1435   PT Stop Time 1517   PT Time Calculation (min) 42 min   Activity Tolerance Patient tolerated treatment well   Behavior During Therapy [facility] for tasks assessed/performed      Past Medical History  Diagnosis Date  . CHF (congestive heart failure)      preserved left ventricular systolic function  . Hypertension   . Overweight(278.02)   . Hypothyroid   . Vaginal pessary present ~ 2012  . CKD (chronic kidney disease) stage 4, GFR 15-29 ml/min     [name] 02/16/2013  . Anemia associated with chronic renal failure     ESA therapy  . History of blood transfusion 1975  . Rheumatoid arthritis   . Osteoporosis   . Pneumonia 02/2011    [name] 02/20/2011 (02/16/2013)  . Diabetic foot ulcers     "get them on borh feet" (02/16/2013)  . Shortness of breath dyspnea     with exertion  . Type II diabetes mellitus     patient denies    Past Surgical History  Procedure Laterality Date  . Cataract extraction w/ intraocular lens  implant, bilateral Bilateral 1990's  . Orif femur fracture Left 1975    "got a rod and screw in it" (02/16/2013)  . Av fistula placement Left 01/26/2015    Procedure: LEFT ARM ARTERIOVENOUS (AV) FISTULA CREATION;  Surgeon: [name], MD;  Location: [facility] OR;  Service: Vascular;  Laterality: Left;    There were no vitals filed for this visit.  Visit Diagnosis:  Open toe wound,  subsequent encounter  Difficulty walking                 Wound Therapy - 08/12/15 1625    Subjective Pt states that she has no pain with her wounds.  Pt leg is very swollen therapist asked pt about this and pt states that in the AM her LE is about normal but as the day goes on she has increased pain    Patient and Family Stated Goals get all wounds healed up    Date of Onset --  patient cannot remember    Prior Treatments self treat   Pain Assessment No/denies pain   Evaluation and Treatment Procedures Explained to Patient/Family Yes   Evaluation and Treatment Procedures agreed to   Wound Properties Date First Assessed: 08/08/15 Location: Toe (Comment  which one) Location Orientation: Left;Other (Comment) , 2nd toe   Wound Description (Comments): 2nd toe  Present on Admission: Yes   Dressing Type Gauze (Comment)   Dressing Changed Changed   Dressing Status Old drainage   Dressing Change Frequency PRN   Site / Wound Assessment Yellow;Granulation tissue   % Wound base Red or Granulating 10%   % Wound base Yellow 90%   % Wound base Black 0%   Peri-wound Assessment Intact;Edema  Margins Epibole (rolled edges)   Closure None   Drainage Amount Moderate   Drainage Description Serous;Odor   Treatment Cleansed;Debridement (Selective)   Wound Properties Date First Assessed: 08/08/15 Location: Toe (Comment  which one) Location Orientation: Left Wound Description (Comments): 3rd toe left  Present on Admission: Yes   Dressing Type None;Silver hydrofiber   Dressing Changed Changed   Dressing Status None   Dressing Change Frequency PRN   Site / Wound Assessment Yellow   % Wound base Red or Granulating 10%   % Wound base Yellow 90%   % Wound base Black 0%   Margins Epibole (rolled edges)   Closure None   Drainage Amount Moderate   Drainage Description Serous;Odor   Treatment Cleansed;Debridement (Selective)   Selective Debridement - Location 2nd toe L    Selective Debridement  - Tools Used Forceps   Selective Debridement - Tissue Removed minimal slough    Wound Therapy - Clinical Statement Pt has additional wound on anterior and posterior aspect of LE.   All wounds have moderate drainage with foul odor.  LE was cleansed and moisturized prior to debridement.  Wounds were dressed with silverhydrofiber.  Due to swelling pt had cotton placed on LE to have even pressure followed by coban for light compression.  Pt instruced to take dressing off if they became uncomfortable.  Pt verbalized that she has wound on Rt LE and that evaluating therapist was going to request an order to treat Rt LE but we have not gotten this back yet.    Wound Therapy - Functional Problem List non-healing wounsd    Factors Delaying/Impairing Wound Healing Altered sensation;Diabetes Mellitus;Polypharmacy;Vascular compromise   Wound Therapy - Frequency Other (comment)  2x/week   Wound Therapy - Current Recommendations PT   Wound Plan debridement, dresing wtih silver hydrofiber and light comprssion as pt may have arterial deficits.  Check on referral for Lt LE    Decrease Necrotic Tissue to 30%   Decrease Necrotic Tissue - Progress Progressing toward goal   Increase Granulation Tissue to 70%   Increase Granulation Tissue - Progress Progressing toward goal   Decrease Length/Width/Depth by (cm) .06cm 2nd toe, full closure 3rd toe    Additional Wound Therapy Goal full wound closure both toes    Additional Wound Therapy Goal - Progress Progressing toward goal   Goals/treatment plan/discharge plan were made with and agreed upon by patient/family Yes   Time For Goal Achievement Other (comment)  8 weeks    Wound Therapy - Potential for Goals Good                              Problem List Patient Active Problem List   Diagnosis Date Noted  . Uterovaginal prolapse, complete 03/26/2013  . Diabetic foot ulcers (HCC) 02/16/2013  . Hyperkalemia 06/24/2012  . Hyperlipidemia  05/20/2012  . Uterine prolapse 05/19/2012  . Chronic kidney disease, stage 4, severely decreased GFR (HCC)   . Hypertension   . Anemia associated with chronic renal failure   . Hypothyroid   . CAROTID BRUIT, LEFT 05/11/2009  . DIABETES MELLITUS 03/11/2009  . OVERWEIGHT/OBESITY 03/11/2009  Virgina Organ, PT CLT [phone] 08/12/2015, 4:39 PM  [facility] [address] [place], [place], [address] Phone: [phone]   Fax:  [phone]  Name: [name] MRN: [id number] Date of Birth: 05/04/1932

## 2015-08-15 ENCOUNTER — Ambulatory Visit (HOSPITAL_COMMUNITY): Payer: Medicare Other | Admitting: Physical Therapy

## 2015-08-15 DIAGNOSIS — S91109D Unspecified open wound of unspecified toe(s) without damage to nail, subsequent encounter: Secondary | ICD-10-CM | POA: Diagnosis not present

## 2015-08-15 DIAGNOSIS — S81802D Unspecified open wound, left lower leg, subsequent encounter: Secondary | ICD-10-CM | POA: Diagnosis not present

## 2015-08-15 DIAGNOSIS — R262 Difficulty in walking, not elsewhere classified: Secondary | ICD-10-CM | POA: Diagnosis not present

## 2015-08-15 DIAGNOSIS — S81801D Unspecified open wound, right lower leg, subsequent encounter: Secondary | ICD-10-CM | POA: Diagnosis not present

## 2015-08-15 NOTE — Therapy (Signed)
[facility] [address] [place], [place], [address] Phone: [phone]   Fax:  [phone]  Wound Care Therapy  Patient Details  Name: [name] MRN: [id number] Date of Birth: 1931-09-11 No Data Recorded  Encounter Date: 08/15/2015      PT End of Session - 08/15/15 1435    Visit Number 3   Number of Visits 16   Date for PT Re-Evaluation 09/05/15   Authorization Type UHC Medicare and medicaid    Authorization Time Period 08/08/15 to 10/08/15   Authorization - Visit Number 3   Authorization - Number of Visits 10   PT Start Time 1100   PT Stop Time 1145   PT Time Calculation (min) 45 min   Activity Tolerance Patient tolerated treatment well   Behavior During Therapy [facility] for tasks assessed/performed      Past Medical History  Diagnosis Date  . CHF (congestive heart failure)      preserved left ventricular systolic function  . Hypertension   . Overweight(278.02)   . Hypothyroid   . Vaginal pessary present ~ 2012  . CKD (chronic kidney disease) stage 4, GFR 15-29 ml/min     [name] 02/16/2013  . Anemia associated with chronic renal failure     ESA therapy  . History of blood transfusion 1975  . Rheumatoid arthritis   . Osteoporosis   . Pneumonia 02/2011    [name] 02/20/2011 (02/16/2013)  . Diabetic foot ulcers     "get them on borh feet" (02/16/2013)  . Shortness of breath dyspnea     with exertion  . Type II diabetes mellitus     patient denies    Past Surgical History  Procedure Laterality Date  . Cataract extraction w/ intraocular lens  implant, bilateral Bilateral 1990's  . Orif femur fracture Left 1975    "got a rod and screw in it" (02/16/2013)  . Av fistula placement Left 01/26/2015    Procedure: LEFT ARM ARTERIOVENOUS (AV) FISTULA CREATION;  Surgeon: [name], MD;  Location: [facility] OR;  Service: Vascular;  Laterality: Left;    There were no vitals filed for this visit.  Visit Diagnosis:  Open toe wound,  subsequent encounter  Difficulty walking                 Wound Therapy - 08/15/15 1419    Subjective Pt states her toes are sore but her Leg is overall better with less swelling   Patient and Family Stated Goals get all wounds healed up    Date of Onset --  patient cannot remember    Prior Treatments self treat   Pain Assessment No/denies pain   Wound Properties Date First Assessed: 08/08/15 Location: Toe (Comment  which one) Location Orientation: Left;Other (Comment) , 2nd toe   Wound Description (Comments): 2nd toe  Present on Admission: Yes   Dressing Type Gauze (Comment);Silver hydrofiber   Dressing Changed Changed   Dressing Status Old drainage   Dressing Change Frequency PRN   Site / Wound Assessment Yellow;Granulation tissue   % Wound base Red or Granulating 10%   % Wound base Yellow 90%   % Wound base Black 0%   Peri-wound Assessment Intact;Edema   Margins Epibole (rolled edges)   Closure None   Drainage Amount Minimal   Drainage Description Serous;Odor   Treatment Cleansed;Debridement (Selective)   Wound Properties Date First Assessed: 08/08/15 Location: Toe (Comment  which one) Location Orientation: Left Wound Description (Comments): 3rd  toe left  Present on Admission: Yes   Dressing Type None;Silver hydrofiber   Dressing Changed Changed   Dressing Status None   Dressing Change Frequency PRN   Site / Wound Assessment Yellow   % Wound base Red or Granulating 10%   % Wound base Yellow 90%   % Wound base Black 0%   Margins Epibole (rolled edges)   Closure None   Drainage Amount Minimal   Drainage Description Serous;Odor   Treatment Cleansed;Debridement (Selective)   Selective Debridement - Location 2nd toe L    Selective Debridement - Tools Used Forceps   Selective Debridement - Tissue Removed minimal slough    Wound Therapy - Clinical Statement Order received for lymphedema therapy.  Unsure of this order due to no documentation of the request.  Pt has  additional wound on anterior and posterior aspect of LE and still have not received orders to treat this area yet.  Did cleanse and dress these areas with profore lite to help improve swelling and promote healing of toes.  Also used toe wraps to promote even compression. Advised patient to contact MD regarding antibiotics.  Lymph/wound therapist not available today to consult so will discuss when therapist returns.   LE was cleansed and moisturized prior to debridement.  Wounds were dressed with silverhydrofiber.  Due to swelling pt had cotton placed on LE to have even pressure followed by coban for light compression.  Pt instruced to take dressing off if they became uncomfortable.     Wound Therapy - Functional Problem List non-healing wounsd    Factors Delaying/Impairing Wound Healing Altered sensation;Diabetes Mellitus;Polypharmacy;Vascular compromise   Wound Therapy - Frequency Other (comment)  2x/week   Wound Therapy - Current Recommendations PT   Wound Plan debridement, dressing with silver hydrofiber and light comprssion as pt may have arterial deficits.  Check on referral for Lt LE    Decrease Necrotic Tissue to 30%   Decrease Necrotic Tissue - Progress Progressing toward goal   Increase Granulation Tissue to 70%   Increase Granulation Tissue - Progress Progressing toward goal   Decrease Length/Width/Depth by (cm) .06cm 2nd toe, full closure 3rd toe    Additional Wound Therapy Goal full wound closure both toes    Additional Wound Therapy Goal - Progress Progressing toward goal   Goals/treatment plan/discharge plan were made with and agreed upon by patient/family Yes   Time For Goal Achievement Other (comment)  8 weeks    Wound Therapy - Potential for Goals Good                              Problem List Patient Active Problem List   Diagnosis Date Noted  . Uterovaginal prolapse, complete 03/26/2013  . Diabetic foot ulcers (HCC) 02/16/2013  . Hyperkalemia  06/24/2012  . Hyperlipidemia 05/20/2012  . Uterine prolapse 05/19/2012  . Chronic kidney disease, stage 4, severely decreased GFR (HCC)   . Hypertension   . Anemia associated with chronic renal failure   . Hypothyroid   . CAROTID BRUIT, LEFT 05/11/2009  . DIABETES MELLITUS 03/11/2009  . OVERWEIGHT/OBESITY 03/11/2009    Lurena Nida, PTA/CLT [phone]  08/15/2015, 2:36 PM  [place] [address] [place], [place], [address] Phone: [phone]   Fax:  [phone]  Name: JEANEE FABRE MRN: [id number] Date of Birth: 27-Jan-1932

## 2015-08-16 ENCOUNTER — Encounter (HOSPITAL_COMMUNITY)
Admission: RE | Admit: 2015-08-16 | Discharge: 2015-08-16 | Disposition: A | Discharge status: Home / Self Care | Payer: Medicare Other | Source: Ambulatory Visit | Attending: Nephrology | Admitting: Nephrology

## 2015-08-16 DIAGNOSIS — D631 Anemia in chronic kidney disease: Secondary | ICD-10-CM | POA: Insufficient documentation

## 2015-08-16 DIAGNOSIS — N183 Chronic kidney disease, stage 3 (moderate): Secondary | ICD-10-CM | POA: Diagnosis not present

## 2015-08-16 LAB — POCT HEMOGLOBIN-HEMACUE: HEMOGLOBIN: 10.2 g/dL — AB (ref 12.0–15.0)

## 2015-08-16 MED ORDER — EPOETIN ALFA 20000 UNIT/ML IJ SOLN
20000.0000 [IU] | INTRAMUSCULAR | Status: DC
  Administered 2015-08-16: 20000 [IU] via SUBCUTANEOUS

## 2015-08-16 MED ORDER — EPOETIN ALFA 20000 UNIT/ML IJ SOLN
INTRAMUSCULAR | Status: AC
  Administered 2015-08-16: 20000 [IU] via SUBCUTANEOUS
  Filled 2015-08-16: qty 1

## 2015-08-17 ENCOUNTER — Ambulatory Visit (HOSPITAL_COMMUNITY): Payer: Medicare Other | Admitting: Physical Therapy

## 2015-08-17 DIAGNOSIS — S91109D Unspecified open wound of unspecified toe(s) without damage to nail, subsequent encounter: Secondary | ICD-10-CM | POA: Diagnosis not present

## 2015-08-17 DIAGNOSIS — S81801D Unspecified open wound, right lower leg, subsequent encounter: Secondary | ICD-10-CM

## 2015-08-17 DIAGNOSIS — R262 Difficulty in walking, not elsewhere classified: Secondary | ICD-10-CM

## 2015-08-17 DIAGNOSIS — S81802D Unspecified open wound, left lower leg, subsequent encounter: Secondary | ICD-10-CM | POA: Diagnosis not present

## 2015-08-17 NOTE — Therapy (Signed)
[facility] [facility] [address] [place], [place], [address] Phone: [phone]   Fax:  [phone]  Wound Care Therapy [facility] Wound Evals/Cert done)  Patient Details  Name: [name] MRN: [id number] Date of Birth: 1932-07-17 No Data Recorded  Encounter Date: 08/17/2015      PT End of Session - 08/17/15 1945    Visit Number 4   Number of Visits 16   Date for PT Re-Evaluation 09/05/15   Authorization Type UHC Medicare and medicaid    Authorization Time Period 08/08/15 to 10/08/15   Authorization - Visit Number 4   Authorization - Number of Visits 10   PT Start Time 1104   PT Stop Time 1227   PT Time Calculation (min) 83 min   Activity Tolerance Patient tolerated treatment well   Behavior During Therapy [facility] for tasks assessed/performed      Past Medical History  Diagnosis Date  . CHF (congestive heart failure)      preserved left ventricular systolic function  . Hypertension   . Overweight(278.02)   . Hypothyroid   . Vaginal pessary present ~ 2012  . CKD (chronic kidney disease) stage 4, GFR 15-29 ml/min     [name] 02/16/2013  . Anemia associated with chronic renal failure     ESA therapy  . History of blood transfusion 1975  . Rheumatoid arthritis   . Osteoporosis   . Pneumonia 02/2011    [name] 02/20/2011 (02/16/2013)  . Diabetic foot ulcers     "get them on borh feet" (02/16/2013)  . Shortness of breath dyspnea     with exertion  . Type II diabetes mellitus     patient denies    Past Surgical History  Procedure Laterality Date  . Cataract extraction w/ intraocular lens  implant, bilateral Bilateral 1990's  . Orif femur fracture Left 1975    "got a rod and screw in it" (02/16/2013)  . Av fistula placement Left 01/26/2015    Procedure: LEFT ARM ARTERIOVENOUS (AV) FISTULA CREATION;  Surgeon: [name], MD;  Location: [facility] OR;  Service: Vascular;  Laterality: Left;    There were no vitals filed for this visit.  Visit  Diagnosis:  Multiple open wounds of lower leg, left, subsequent encounter - Plan: PT plan of care cert/re-cert  Multiple open wounds of lower leg, right, subsequent encounter - Plan: PT plan of care cert/re-cert  Open toe wound, subsequent encounter - Plan: PT plan of care cert/re-cert  Difficulty walking - Plan: PT plan of care cert/re-cert                 Wound Therapy - 08/17/15 1915    Subjective Patient arrives fatigued today, reports she felt rushed as driver was late; no pain today.    Patient and Family Stated Goals get all wounds healed up    Prior Treatments self treat   Pain Assessment No/denies pain   Evaluation and Treatment Procedures Explained to Patient/Family Yes   Evaluation and Treatment Procedures agreed to   Wound Properties Date First Assessed: 08/17/15 Wound Type: Other (Comment) , R LE posterior medial   Location: Other (Comment) , R shin posterior medial   Location Orientation: Right;Posterior;Medial Wound Description (Comments): R shin posterior medial  Present on Admission: Yes   Dressing Type Gauze (Comment)   Dressing Changed New   Dressing Status Old drainage   % Wound base Red or Granulating 5%   % Wound base Yellow 90%   %  Wound base Other (Comment) 5%  brown   Peri-wound Assessment Other (Comment)  drness of skin    Wound Length (cm) 2 cm   Wound Width (cm) 3 cm   Wound Depth (cm) 0.2 cm   Drainage Amount Minimal   Drainage Description Serosanguineous   Treatment Cleansed;Packing (Impregnated strip)  silver hyrdofiber    Wound Properties Date First Assessed: 08/08/15 Location: Toe (Comment  which one) Location Orientation: Left;Other (Comment) , 2nd toe   Wound Description (Comments): 2nd toe  Present on Admission: Yes   Dressing Type Gauze (Comment);Silver hydrofiber   Dressing Changed Changed   Dressing Status Old drainage   Dressing Change Frequency PRN   Site / Wound Assessment Yellow;Granulation tissue   % Wound base Red or  Granulating 15%   % Wound base Yellow 85%   % Wound base Black 0%   Peri-wound Assessment Intact;Edema   Margins Epibole (rolled edges)   Closure None   Drainage Amount Minimal   Drainage Description Serous;Odor   Treatment Cleansed;Packing (Impregnated strip)  silver hydrofiber    Wound Properties Date First Assessed: 08/08/15 Location: Toe (Comment  which one) Location Orientation: Left Wound Description (Comments): 3rd toe left  Present on Admission: Yes   Dressing Type None;Silver hydrofiber   Dressing Changed Changed   Dressing Status None   Dressing Change Frequency PRN   Site / Wound Assessment Yellow   % Wound base Red or Granulating 10%   % Wound base Yellow 90%   % Wound base Black 0%   Margins Epibole (rolled edges)   Closure None   Drainage Amount Minimal   Drainage Description Serous;Odor   Treatment Cleansed;Packing (Impregnated strip)  silver hydrofiber, bandaid    Wound Properties Date First Assessed: 08/17/15 Location Orientation: Right;Posterior Wound Description (Comments): R posterior shin  Present on Admission: Yes   Dressing Type Gauze (Comment)   Dressing Changed Changed   Dressing Status Old drainage   Site / Wound Assessment Red   % Wound base Red or Granulating 100%   % Wound base Yellow 0%   % Wound base Black 0%   Peri-wound Assessment Intact   Wound Length (cm) 0.5 cm   Wound Width (cm) 0.7 cm   Wound Depth (cm) --  negligable    Closure None   Drainage Amount Minimal   Drainage Description Serosanguineous   Treatment Cleansed;Packing (Impregnated strip)  xeroform    Wound Properties Date First Assessed: 08/17/15 Location: Other (Comment) , R posterior lateral shin   Location Orientation: Right;Posterior;Lateral Present on Admission: Yes   Dressing Type Gauze (Comment)   Dressing Changed Changed   Site / Wound Assessment Red   % Wound base Red or Granulating 100%   % Wound base Yellow 0%   % Wound base Black 0%   Wound Length (cm) 2.5 cm    Wound Width (cm) 2 cm   Wound Depth (cm) --  negligible    Closure None   Drainage Amount Minimal   Drainage Description Serosanguineous   Treatment Cleansed;Packing (Impregnated strip)  xeroform    Wound Properties Date First Assessed: 08/17/15 Location: Other (Comment) , L antero medial shin   Location Orientation: Left;Anterior;Medial , L antero medial shin   Present on Admission: Yes   Dressing Type Gauze (Comment)   Dressing Changed Changed   Dressing Status Old drainage   % Wound base Red or Granulating 25%   % Wound base Yellow 75%   Peri-wound Assessment Maceration   Wound  Length (cm) 1.2 cm   Wound Width (cm) 0.5 cm   Wound Depth (cm) 0.1 cm   Closure None   Drainage Amount Minimal   Drainage Description Serosanguineous   Treatment Cleansed;Packing (Impregnated strip)  silver hydrofiber    Wound Properties Date First Assessed: 08/17/15 Location: Other (Comment) , L posterior medial 1  Location Orientation: Left;Posterior;Medial , L posterior medial 1   Present on Admission: Yes   Dressing Type Gauze (Comment)   Dressing Changed Changed   Dressing Status Old drainage   % Wound base Red or Granulating 5%   % Wound base Yellow 95%   Wound Length (cm) 1 cm   Wound Width (cm) 1.2 cm   Wound Depth (cm) 0.1 cm   Closure None   Drainage Amount Minimal   Drainage Description Serosanguineous   Treatment Cleansed;Packing (Impregnated strip)  silver hydrofiber    Wound Properties Date First Assessed: 08/17/15 Location: Other (Comment) , L posterior medial 2   Location Orientation: Left;Posterior;Medial;Other (Comment) , L posterior medial 2   Present on Admission: Yes   Dressing Type Gauze (Comment)   Dressing Changed Changed   Dressing Status Old drainage   % Wound base Red or Granulating 100%   % Wound base Yellow 0%   % Wound base Black 0%   Wound Length (cm) 0.3 cm   Wound Width (cm) 0.3 cm   Wound Depth (cm) 0.1 cm   Closure None   Drainage Amount Minimal    Drainage Description Serosanguineous   Treatment Cleansed;Packing (Impregnated strip)  xeroform    Wound Therapy - Clinical Statement Received new MD order to address multiple shin wounds and lymphedema; focused on shin wounds today as this PT not lymphedema certified. Found 3 wounds on each shin: L antero-medial, 2 L posterior medial, R medial posterior, R posterior, and R lateral posterior. Evaluated and dressed all wounds today with silver hydrofiber and xeroform as appropriate. Educated patient about upcoming lymphedema eval when lymph PT back to perform eval. Wrapped both legs with Profore lite today.    Wound Therapy - Functional Problem List non-healing wounsd    Factors Delaying/Impairing Wound Healing Altered sensation;Diabetes Mellitus;Polypharmacy;Vascular compromise   Wound Therapy - Frequency Other (comment)   Wound Therapy - Current Recommendations PT   Wound Plan debridement, silver and xeroform as appropriate, Profore Lites for compression, lymph eval    Decrease Necrotic Tissue to 30%   Decrease Necrotic Tissue - Progress Progressing toward goal   Increase Granulation Tissue to 70%   Increase Granulation Tissue - Progress Progressing toward goal   Decrease Length/Width/Depth by (cm) at least 60% closure all wounds    Decrease Length/Width/Depth - Progress Goal set today   Patient/Family will be able to  self-dress wounds and recognize signs of infection    Patient/Family Instruction Goal - Progress Goal set today   Additional Wound Therapy Goal full wound closure all wounds    Additional Wound Therapy Goal - Progress Goal set today   Goals/treatment plan/discharge plan were made with and agreed upon by patient/family Yes   Time For Goal Achievement Other (comment)   Wound Therapy - Potential for Goals Good                 PT Education - 08/17/15 1944    Education provided Yes   Education Details wound evals toay fo both shins due to MD order, upcoming lymph eval.  Take off proform if color change or pain.    Person(s)  Educated Patient   Methods Explanation   Comprehension Verbalized understanding                      G-Codes - September 05, 2015 1947    Functional Assessment Tool Used Based on wound healing status, presence of slough, general wound healing status, wound size, number of wounds   Functional Limitation Other PT primary   Other PT Primary Current Status (G5498) At least 60 percent but less than 80 percent impaired, limited or restricted   Other PT Primary Goal Status (Y6415) At least 40 percent but less than 60 percent impaired, limited or restricted       Problem List Patient Active Problem List   Diagnosis Date Noted  . Uterovaginal prolapse, complete 03/26/2013  . Diabetic foot ulcers (HCC) 02/16/2013  . Hyperkalemia 06/24/2012  . Hyperlipidemia 05/20/2012  . Uterine prolapse 05/19/2012  . Chronic kidney disease, stage 4, severely decreased GFR (HCC)   . Hypertension   . Anemia associated with chronic renal failure   . Hypothyroid   . CAROTID BRUIT, LEFT 05/11/2009  . DIABETES MELLITUS 03/11/2009  . OVERWEIGHT/OBESITY 03/11/2009    Nedra Hai PT, DPT [phone]  [facility] [address] [place], [place], [address] Phone: [phone]   Fax:  [phone]  Name: [name] MRN: [id number] Date of Birth: 1932/04/20

## 2015-08-23 ENCOUNTER — Ambulatory Visit (HOSPITAL_COMMUNITY): Payer: Medicare Other | Admitting: Physical Therapy

## 2015-08-23 DIAGNOSIS — S91109D Unspecified open wound of unspecified toe(s) without damage to nail, subsequent encounter: Secondary | ICD-10-CM | POA: Diagnosis not present

## 2015-08-23 DIAGNOSIS — R262 Difficulty in walking, not elsewhere classified: Secondary | ICD-10-CM | POA: Diagnosis not present

## 2015-08-23 DIAGNOSIS — S81802D Unspecified open wound, left lower leg, subsequent encounter: Secondary | ICD-10-CM | POA: Diagnosis not present

## 2015-08-23 DIAGNOSIS — S81801D Unspecified open wound, right lower leg, subsequent encounter: Secondary | ICD-10-CM

## 2015-08-23 NOTE — Therapy (Signed)
[facility] [address] [place], [place], [address] Phone: [phone]   Fax:  [phone]  Wound Care Therapy  Patient Details  Name: [name] MRN: [id number] Date of Birth: Feb 23, 1932 No Data Recorded  Encounter Date: 08/23/2015      PT End of Session - 08/23/15 1530    Visit Number 5   Number of Visits 16   Date for PT Re-Evaluation 09/05/15   Authorization Type UHC Medicare and medicaid    Authorization Time Period 08/08/15 to 10/08/15   Authorization - Visit Number 5   Authorization - Number of Visits 10   PT Start Time 1355   PT Stop Time 1505   PT Time Calculation (min) 70 min   Activity Tolerance Patient tolerated treatment well   Behavior During Therapy [facility] for tasks assessed/performed      Past Medical History  Diagnosis Date  . CHF (congestive heart failure)      preserved left ventricular systolic function  . Hypertension   . Overweight(278.02)   . Hypothyroid   . Vaginal pessary present ~ 2012  . CKD (chronic kidney disease) stage 4, GFR 15-29 ml/min     [name] 02/16/2013  . Anemia associated with chronic renal failure     ESA therapy  . History of blood transfusion 1975  . Rheumatoid arthritis   . Osteoporosis   . Pneumonia 02/2011    [name] 02/20/2011 (02/16/2013)  . Diabetic foot ulcers     "get them on borh feet" (02/16/2013)  . Shortness of breath dyspnea     with exertion  . Type II diabetes mellitus     patient denies    Past Surgical History  Procedure Laterality Date  . Cataract extraction w/ intraocular lens  implant, bilateral Bilateral 1990's  . Orif femur fracture Left 1975    "got a rod and screw in it" (02/16/2013)  . Av fistula placement Left 01/26/2015    Procedure: LEFT ARM ARTERIOVENOUS (AV) FISTULA CREATION;  Surgeon: [name], MD;  Location: [facility] OR;  Service: Vascular;  Laterality: Left;    There were no vitals filed for this visit.  Visit Diagnosis:  Multiple open wounds of  lower leg, left, subsequent encounter  Multiple open wounds of lower leg, right, subsequent encounter  Open toe wound, subsequent encounter  Difficulty walking   Date 08/23/2015 08/23/2015   RT LT   MTP 22.50 24.00  ankle 27.2 31.5  4cm 24.80 25.00  8cm 28.80 29.80  12 cm 32.30 34.00  16cm 36.50 42.50  20cm 43.50 45.70  24cm 46.00 48.50  28cm 45.80 49.60  32cm 43.00 45.50  36cm    40cm    44cm    48cm    52cm    56cm    60cm    64cm        Sum of squares 13021.00 15014.69  Total Volume 4144.7145 4779.326                 Wound Therapy - 08/23/15 1507    Subjective Pt states her legs are feeling much better.  No pain at all.   Patient and Family Stated Goals get all wounds healed up    Prior Treatments self treat   Pain Assessment No/denies pain   Evaluation and Treatment Procedures Explained to Patient/Family Yes   Evaluation and Treatment Procedures agreed to   Wound Properties Date First Assessed: 08/17/15 Wound Type: Other (Comment) , R LE posterior  medial   Location: Other (Comment) , R shin posterior medial   Location Orientation: Right;Posterior;Medial Wound Description (Comments): R shin posterior medial  Present on Admission: Yes   Dressing Type Gauze (Comment);Impregnated gauze (bismuth)  xeroform   Dressing Changed Changed   Dressing Status Old drainage   % Wound base Red or Granulating 50%   % Wound base Yellow 50%   Peri-wound Assessment Other (Comment)  drness of skin    Drainage Amount Minimal   Drainage Description Serosanguineous   Treatment Cleansed;Debridement (Selective)   Wound Properties Date First Assessed: 08/08/15 Location: Toe (Comment  which one) Location Orientation: Left;Other (Comment) , 2nd toe   Wound Description (Comments): 2nd toe  Present on Admission: Yes   Dressing Type Gauze (Comment);Silver hydrofiber   Dressing Changed Changed   Dressing Status Old drainage   Dressing Change Frequency PRN   Site / Wound  Assessment Yellow;Granulation tissue   % Wound base Red or Granulating 25%   % Wound base Yellow 75%   % Wound base Black 0%   Peri-wound Assessment Intact;Edema   Margins Epibole (rolled edges)   Closure None   Drainage Amount Moderate   Drainage Description Serous;Odor   Treatment Cleansed;Debridement (Selective)   Wound Properties Date First Assessed: 08/08/15 Location: Toe (Comment  which one) Location Orientation: Left Wound Description (Comments): 3rd toe left  Present on Admission: Yes   Dressing Type None;Silver hydrofiber   Dressing Changed Changed   Dressing Status None   Dressing Change Frequency PRN   Site / Wound Assessment Yellow   % Wound base Red or Granulating 25%   % Wound base Yellow 75%   % Wound base Black 0%   Margins Epibole (rolled edges)   Closure None   Drainage Amount Minimal   Drainage Description Serous;Odor   Treatment Cleansed;Debridement (Selective)   Wound Properties Date First Assessed: 08/17/15 Location Orientation: Right;Posterior Wound Description (Comments): R posterior shin  Present on Admission: Yes   Dressing Type Gauze (Comment);Impregnated gauze (bismuth)   Dressing Changed Changed   Dressing Status Old drainage   Site / Wound Assessment Red   % Wound base Red or Granulating 100%   % Wound base Yellow 0%   % Wound base Black 0%   Peri-wound Assessment Intact   Closure None   Drainage Amount Minimal   Drainage Description Serosanguineous   Treatment Cleansed;Debridement (Selective)   Wound Properties Date First Assessed: 08/17/15 Location: Other (Comment) , R posterior lateral shin   Location Orientation: Right;Posterior;Lateral Present on Admission: Yes   Dressing Type None;Impregnated gauze (bismuth)   Dressing Changed Changed   Site / Wound Assessment Red   % Wound base Red or Granulating 100%   % Wound base Yellow 0%   % Wound base Black 0%   Closure None   Drainage Amount Minimal   Drainage Description Serosanguineous    Treatment Cleansed;Debridement (Selective)   Wound Properties Date First Assessed: 08/17/15 Location: Other (Comment) , L antero medial shin   Location Orientation: Left;Anterior;Medial , L antero medial shin   Present on Admission: Yes   Dressing Type Gauze (Comment);Silver hydrofiber   Dressing Changed Changed   Dressing Status Old drainage   % Wound base Red or Granulating 25%   % Wound base Yellow 75%   Peri-wound Assessment Maceration   Closure None   Drainage Amount Moderate   Drainage Description Serosanguineous   Treatment Cleansed;Debridement (Selective)   Wound Properties Date First Assessed: 08/17/15 Location: Other (Comment) , L  posterior medial 1  Location Orientation: Left;Posterior;Medial , L posterior medial 1   Present on Admission: Yes   Dressing Type Gauze (Comment);Silver hydrofiber   Dressing Changed Changed   Dressing Status Old drainage   % Wound base Red or Granulating 50%   % Wound base Yellow 50%   Closure None   Drainage Amount Moderate   Drainage Description Serosanguineous   Treatment Cleansed;Debridement (Selective)   Wound Properties Date First Assessed: 08/17/15 Location: Other (Comment) , L posterior medial 2   Location Orientation: Left;Posterior;Medial;Other (Comment) , L posterior medial 2   Present on Admission: Yes   Dressing Type Gauze (Comment);Impregnated gauze (bismuth)   Dressing Changed Changed   Dressing Status Old drainage   % Wound base Red or Granulating 100%   % Wound base Yellow 0%   % Wound base Black 0%   Closure None   Drainage Amount Minimal   Drainage Description Serosanguineous   Treatment Cleansed;Debridement (Selective)   Selective Debridement - Location All wounds and dry skin perimeter   Selective Debridement - Tools Used Forceps   Selective Debridement - Tissue Removed Slough, dry skin   Wound Therapy - Clinical Statement Received new MD order to address multiple shin wounds and lymphedema; focused on shin wounds last  session and completed circumferential measurements for Bilateral LE's for lymphedema treatment.  Edema is overall decreased since beginning compression therapy with approximation and increasing granulation of wounds.  Lt LE wounds continue to drain moderately and LT minimally.  Used xeroform on Rt LE and silver hydrofiber on Rt LE.  Continued  Education about need for compression garments to be worn after completing wound care. Wrapped both legs with Profore lite today     08/23/15 1604  Wound Therapy - Assess/Plan/Recommendations  Wound Therapy - Clinical Statement Order for treatment for lymphedema has been recieved.   PT has B lymphedema with Lt greater than Rt by 635 cc of fluid.  Pt had a prescription for compression hose sent to MD today. Pt states that her legs have been edematous for a long time.  States that her children have edema in there LE as well.  Pt has been having wounds on and off for years but her edema has not been controlled. 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  Pt has fibrotic changes as well as hyperkeratosis and papilloma formations        Wound Therapy - Functional Problem List non-healing wound    Factors Delaying/Impairing Wound Healing Altered sensation;Diabetes Mellitus;Polypharmacy;Vascular compromise   Wound Therapy - Frequency Other (comment)   Wound Therapy - Current Recommendations PT   Wound Plan debridement, silver and xeroform as appropriate, Profore Lite for compression.    Decrease Necrotic Tissue to 30%   Increase Granulation Tissue to 70%   Decrease Length/Width/Depth by (cm) at least 60% closure all wounds    Patient/Family will be able to  self-dress wounds and recognize signs of infection    Additional Wound Therapy Goal full wound closure all wounds    Goals/treatment plan/discharge plan were made with and agreed upon by patient/family Yes   Time For Goal Achievement Other (comment)   Wound Therapy - Potential for Goals Good     Additional goals:   STG: 2 weeks 1:  Pt to be I in remedial exercises for LE to promote lymph circulation to assist in decreasing volume. 2.  Pt to be able to recognize signs and symptoms of cellulitis to prevent hospitalization LTG:  4 weeks 1.  Pt volume to be decreased by 30 % to create a better environment for wound healing as well as to improve mobility       To reduce risk of falling. 2.  Pt to have been fitted for compression stockings to prepare for maintenance phase of lymphedema. 3.  Pt to verbalize the importance of wearing compression garment daily to prevent wounds from reoccurring.             Problem List Patient Active Problem List   Diagnosis Date Noted  . Uterovaginal prolapse, complete 03/26/2013  . Diabetic foot ulcers (HCC) 02/16/2013  . Hyperkalemia 06/24/2012  . Hyperlipidemia 05/20/2012  . Uterine prolapse 05/19/2012  . Chronic kidney disease, stage 4, severely decreased GFR (HCC)   . Hypertension   . Anemia associated with chronic renal failure   . Hypothyroid   . CAROTID BRUIT, LEFT 05/11/2009  . DIABETES MELLITUS 03/11/2009  . OVERWEIGHT/OBESITY 03/11/2009    [name], PTA/CLT [phone] 08/23/2015, 4:07 PM [name], PT CLT [phone] [facility] [address] [place], [place], [address] Phone: [phone]   Fax:  [phone]  Name: [name] MRN: [id number] Date of Birth: 26-Apr-1932

## 2015-08-24 ENCOUNTER — Encounter (HOSPITAL_COMMUNITY)
Admission: RE | Admit: 2015-08-24 | Discharge: 2015-08-24 | Disposition: A | Discharge status: Home / Self Care | Payer: Medicare Other | Source: Ambulatory Visit | Attending: Nephrology | Admitting: Nephrology

## 2015-08-24 DIAGNOSIS — N183 Chronic kidney disease, stage 3 (moderate): Secondary | ICD-10-CM | POA: Diagnosis not present

## 2015-08-24 DIAGNOSIS — D631 Anemia in chronic kidney disease: Secondary | ICD-10-CM | POA: Diagnosis not present

## 2015-08-24 MED ORDER — EPOETIN ALFA 20000 UNIT/ML IJ SOLN
INTRAMUSCULAR | Status: AC
  Filled 2015-08-24: qty 1

## 2015-08-24 MED ORDER — EPOETIN ALFA 20000 UNIT/ML IJ SOLN
20000.0000 [IU] | INTRAMUSCULAR | Status: DC
  Administered 2015-08-24: 20000 [IU] via SUBCUTANEOUS

## 2015-08-25 LAB — POCT HEMOGLOBIN-HEMACUE: HEMOGLOBIN: 9.9 g/dL — AB (ref 12.0–15.0)

## 2015-08-26 ENCOUNTER — Ambulatory Visit (HOSPITAL_COMMUNITY): Payer: Medicare Other | Admitting: Physical Therapy

## 2015-08-29 ENCOUNTER — Ambulatory Visit (HOSPITAL_COMMUNITY): Payer: Medicare Other | Admitting: Physical Therapy

## 2015-08-29 ENCOUNTER — Telehealth (HOSPITAL_COMMUNITY): Payer: Self-pay | Admitting: Physical Therapy

## 2015-08-29 DIAGNOSIS — R262 Difficulty in walking, not elsewhere classified: Secondary | ICD-10-CM | POA: Diagnosis not present

## 2015-08-29 DIAGNOSIS — S91109D Unspecified open wound of unspecified toe(s) without damage to nail, subsequent encounter: Secondary | ICD-10-CM | POA: Diagnosis not present

## 2015-08-29 DIAGNOSIS — S81802D Unspecified open wound, left lower leg, subsequent encounter: Secondary | ICD-10-CM | POA: Diagnosis not present

## 2015-08-29 DIAGNOSIS — S81801D Unspecified open wound, right lower leg, subsequent encounter: Secondary | ICD-10-CM | POA: Diagnosis not present

## 2015-08-29 NOTE — Therapy (Signed)
[facility] [address] [place], [place], [address] Phone: [phone]   Fax:  [phone]  Wound Care Therapy  Patient Details  Name: [name] Date of Birth: 01-Dec-1931 No Data Recorded  Encounter Date: 08/29/2015      PT End of Session - 08/29/15 1624    Visit Number 6   Number of Visits 16   Date for PT Re-Evaluation 09/05/15   Authorization Type UHC Medicare and medicaid    Authorization Time Period 08/08/15 to 10/08/15   Authorization - Visit Number 6   Authorization - Number of Visits 10   PT Start Time 1405   PT Stop Time 1512   PT Time Calculation (min) 67 min   Activity Tolerance Patient tolerated treatment well   Behavior During Therapy [facility] for tasks assessed/performed      Past Medical History  Diagnosis Date  . CHF (congestive heart failure)      preserved left ventricular systolic function  . Hypertension   . Overweight(278.02)   . Hypothyroid   . Vaginal pessary present ~ 2012  . CKD (chronic kidney disease) stage 4, GFR 15-29 ml/min     [name] 02/16/2013  . Anemia associated with chronic renal failure     ESA therapy  . History of blood transfusion 1975  . Rheumatoid arthritis   . Osteoporosis   . Pneumonia 02/2011    [name] 02/20/2011 (02/16/2013)  . Diabetic foot ulcers     "get them on borh feet" (02/16/2013)  . Shortness of breath dyspnea     with exertion  . Type II diabetes mellitus     patient denies    Past Surgical History  Procedure Laterality Date  . Cataract extraction w/ intraocular lens  implant, bilateral Bilateral 1990's  . Orif femur fracture Left 1975    "got a rod and screw in it" (02/16/2013)  . Av fistula placement Left 01/26/2015    Procedure: LEFT ARM ARTERIOVENOUS (AV) FISTULA CREATION;  Surgeon: [name], MD;  Location: [facility] OR;  Service: Vascular;  Laterality: Left;    There were no vitals filed for this visit.  Visit Diagnosis:  Multiple open wounds of  lower leg, left, subsequent encounter  Multiple open wounds of lower leg, right, subsequent encounter  Open toe wound, subsequent encounter  Difficulty walking                 Wound Therapy - 08/29/15 1521    Subjective Pt states that she has worn customized compression stockings before.  Pt feeling better overall   Patient and Family Stated Goals get all wounds healed up    Prior Treatments self treat   Pain Assessment No/denies pain   Evaluation and Treatment Procedures Explained to Patient/Family Yes   Evaluation and Treatment Procedures agreed to   Wound Properties Date First Assessed: 08/17/15 Wound Type: Other (Comment) , R LE posterior medial   Location: Other (Comment) , R shin posterior medial   Location Orientation: Right;Posterior;Medial Wound Description (Comments): R shin posterior medial  Present on Admission: Yes   Dressing Type --  silverfiber dressing to wound bed followed by  profore    Dressing Changed Changed   Dressing Status Old drainage   Dressing Change Frequency Monday, Wednesday, Friday   % Wound base Red or Granulating 50%   % Wound base Yellow 50%   Peri-wound Assessment Edema   Drainage Amount Minimal   Drainage Description Serous;Odor  Treatment Cleansed;Other (Comment)  manual decongestive techniques.    Wound Properties Date First Assessed: 08/17/15 Location Orientation: Right;Posterior Wound Description (Comments): R posterior shin  Present on Admission: Yes Final Assessment Date: 08/29/15 Final Assessment Time: 1528   Dressing Type --   Dressing Status --   Site / Wound Assessment --   % Wound base Red or Granulating --   % Wound base Yellow --   % Wound base Black --   Peri-wound Assessment --   Closure --   Drainage Amount --   Drainage Description --   Wound Properties Date First Assessed: 08/17/15 Location: Other (Comment) , R posterior lateral shin   Location Orientation: Right;Posterior;Lateral Present on Admission: Yes  Final Assessment Date: 08/29/15 Final Assessment Time: 1530   Dressing Type --   Site / Wound Assessment --   % Wound base Red or Granulating --   % Wound base Yellow --   % Wound base Black --   Closure --   Drainage Amount --   Drainage Description --   Wound Properties Date First Assessed: 08/08/15 Location: Toe (Comment  which one) Location Orientation: Left;Other (Comment) , 2nd toe   Wound Description (Comments): 2nd toe  Present on Admission: Yes   Dressing Type Gauze (Comment);Silver hydrofiber   Dressing Status Old drainage   Dressing Change Frequency PRN   Site / Wound Assessment Yellow;Granulation tissue   % Wound base Red or Granulating 25%   % Wound base Yellow 75%   % Wound base Black 0%   Peri-wound Assessment Intact;Edema   Wound Length (cm) 4 cm   Wound Width (cm) 2.5 cm   Wound Depth (cm) 0.3 cm   Margins Epibole (rolled edges)   Closure None   Drainage Amount Scant   Drainage Description Serous;Odor   Treatment Cleansed;Other (Comment)   Wound Properties Date First Assessed: 08/08/15 Location: Toe (Comment  which one) Location Orientation: Left Wound Description (Comments): 3rd toe left  Present on Admission: Yes   Dressing Type None;Silver hydrofiber   Dressing Status None   Dressing Change Frequency PRN   Site / Wound Assessment Yellow   % Wound base Red or Granulating 60%   % Wound base Yellow 40%   % Wound base Black 0%   Margins Unattached edges (unapproximated)   Closure None   Drainage Amount Minimal   Drainage Description Serous;Odor   Wound Properties Date First Assessed: 08/17/15 Location: Other (Comment) , L antero medial shin   Location Orientation: Left;Anterior;Medial , L antero medial shin   Present on Admission: Yes   Dressing Type Gauze (Comment);Silver hydrofiber   Dressing Status Old drainage   % Wound base Red or Granulating 40%   % Wound base Yellow 60%   Peri-wound Assessment Maceration   Wound Length (cm) 0.7 cm   Wound Width (cm)  0.3 cm   Wound Depth (cm) 0.1 cm   Closure None   Drainage Amount Scant   Drainage Description Serous;Odor   Treatment Cleansed;Other (Comment)   Wound Properties Date First Assessed: 08/17/15 Location: Other (Comment) , L posterior medial 1  Location Orientation: Left;Posterior;Medial , L posterior medial 1   Present on Admission: Yes   Dressing Type Gauze (Comment);Silver hydrofiber   Dressing Status Old drainage   % Wound base Red or Granulating 70%   % Wound base Yellow 30%   Wound Length (cm) 0.6 cm   Wound Width (cm) 0.8 cm   Closure None   Drainage Amount Scant  Drainage Description Serous   Treatment Cleansed;Other (Comment)   Wound Properties Date First Assessed: 08/17/15 Location: Other (Comment) , L posterior medial 2   Location Orientation: Left;Posterior;Medial;Other (Comment) , L posterior medial 2   Present on Admission: Yes   Dressing Type Gauze (Comment);Impregnated gauze (bismuth)   Dressing Status Old drainage   % Wound base Red or Granulating 100%   % Wound base Yellow 0%   % Wound base Black 0%   Closure None   Drainage Amount Minimal   Drainage Description Serosanguineous   Selective Debridement - Location All wounds and dry skin perimeter   Selective Debridement - Tools Used Forceps   Selective Debridement - Tissue Removed Slough, dry skin   Wound Therapy - Clinical Statement Pt volume has decreaed using profore lite. Changed to profore as last ABI was 1.1.  Pt pain has decreased to zero.  Pt recieved manual decongestive techniques including supraclavicular, deep and superficial abdominal followed by routing fluid using the ipsilateal inguinal/axillary anastomosis.    Wound Therapy - Functional Problem List non-healing wound    Factors Delaying/Impairing Wound Healing Altered sensation;Diabetes Mellitus;Polypharmacy;Vascular compromise   Wound Therapy - Frequency Other (comment)   Wound Therapy - Current Recommendations PT   Wound Plan debridement, silver and  xeroform as appropriate, Profore Lites for compression, lymph eval    Decrease Necrotic Tissue to 30%   Increase Granulation Tissue to 70%   Decrease Length/Width/Depth by (cm) at least 60% closure all wounds    Improve Drainage Characteristics --  New STG:  2 weeks:  Pt to be I in ex to improve lymph flow.    Patient/Family will be able to  self-dress wounds and recognize signs of infection   New: 08/23/15 STG: 2 wk able to identify signs of cellulitis   Additional Wound Therapy Goal full wound closure all wounds   new: LTG: 4 week: 30% fluid reduction for improved mobility   Goals/treatment plan/discharge plan were made with and agreed upon by patient/family Yes   Time For Goal Achievement Other (comment)   Wound Therapy - Potential for Goals Good           Problem List Patient Active Problem List   Diagnosis Date Noted  . Uterovaginal prolapse, complete 03/26/2013  . Diabetic foot ulcers (HCC) 02/16/2013  . Hyperkalemia 06/24/2012  . Hyperlipidemia 05/20/2012  . Uterine prolapse 05/19/2012  . Chronic kidney disease, stage 4, severely decreased GFR (HCC)   . Hypertension   . Anemia associated with chronic renal failure   . Hypothyroid   . CAROTID BRUIT, LEFT 05/11/2009  . DIABETES MELLITUS 03/11/2009  . OVERWEIGHT/OBESITY 03/11/2009  Virgina Organ, PT CLT [phone] 08/29/2015, 4:26 PM  [facility] [address] [place], [place], [address] Phone: [phone]   Fax:  [phone]  Name: MARIMAR KEASLER MRN: [id number] Date of Birth: Jan 28, 1932

## 2015-08-29 NOTE — Telephone Encounter (Signed)
Pt left voicemail stating [facility] did not pick her up for her appointment. Lurena Nida, PTA/CLT [phone]

## 2015-08-31 ENCOUNTER — Ambulatory Visit (HOSPITAL_COMMUNITY): Payer: Medicare Other | Admitting: Physical Therapy

## 2015-08-31 DIAGNOSIS — S81801D Unspecified open wound, right lower leg, subsequent encounter: Secondary | ICD-10-CM

## 2015-08-31 DIAGNOSIS — S81802D Unspecified open wound, left lower leg, subsequent encounter: Secondary | ICD-10-CM | POA: Diagnosis not present

## 2015-08-31 DIAGNOSIS — S91109D Unspecified open wound of unspecified toe(s) without damage to nail, subsequent encounter: Secondary | ICD-10-CM

## 2015-08-31 DIAGNOSIS — R262 Difficulty in walking, not elsewhere classified: Secondary | ICD-10-CM | POA: Diagnosis not present

## 2015-08-31 NOTE — Therapy (Signed)
[facility] [address] [place], [place], [address] Phone: [phone]   Fax:  [phone]  Wound Care Therapy  Patient Details  Name: [name] MRN: [id number] Date of Birth: [birth year] No Data Recorded  Encounter Date: 08/31/2015      PT End of Session - 08/31/15 1731    Visit Number 7   Number of Visits 16   Date for PT Re-Evaluation 09/05/15   Authorization Type UHC Medicare and medicaid    Authorization Time Period 08/08/15 to 10/08/15   Authorization - Visit Number 7   Authorization - Number of Visits 10   PT Start Time 1435   PT Stop Time 1600   PT Time Calculation (min) 85 min   Activity Tolerance Patient tolerated treatment well   Behavior During Therapy [facility] for tasks assessed/performed      Past Medical History  Diagnosis Date  . CHF (congestive heart failure)      preserved left ventricular systolic function  . Hypertension   . Overweight(278.02)   . Hypothyroid   . Vaginal pessary present ~ 2012  . CKD (chronic kidney disease) stage 4, GFR 15-29 ml/min     [name] 02/16/2013  . Anemia associated with chronic renal failure     ESA therapy  . History of blood transfusion 1975  . Rheumatoid arthritis   . Osteoporosis   . Pneumonia 02/2011    [name] 02/20/2011 (02/16/2013)  . Diabetic foot ulcers     "get them on borh feet" (02/16/2013)  . Shortness of breath dyspnea     with exertion  . Type II diabetes mellitus     patient denies    Past Surgical History  Procedure Laterality Date  . Cataract extraction w/ intraocular lens  implant, bilateral Bilateral 1990's  . Orif femur fracture Left 1975    "got a rod and screw in it" (02/16/2013)  . Av fistula placement Left 01/26/2015    Procedure: LEFT ARM ARTERIOVENOUS (AV) FISTULA CREATION;  Surgeon: [name], MD;  Location: [facility] OR;  Service: Vascular;  Laterality: Left;    There were no vitals filed for this visit.  Visit Diagnosis:  Multiple open wounds of  lower leg, left, subsequent encounter  Multiple open wounds of lower leg, right, subsequent encounter  Open toe wound, subsequent encounter  Difficulty walking            Wound Therapy - 08/31/15 1721    Subjective Pt states there was a tight spot on her Lt upper leg and almost removed her stockings.  Pt also reports the toe bandages became uncomfortable.  Reminded patient to always remove them if numbness or pain.    Patient and Family Stated Goals get all wounds healed up    Prior Treatments self treat   Pain Assessment No/denies pain   Evaluation and Treatment Procedures Explained to Patient/Family Yes   Evaluation and Treatment Procedures agreed to   Wound Properties Date First Assessed: 08/17/15 Wound Type: Other (Comment) , R LE posterior medial   Location: Other (Comment) , R shin posterior medial   Location Orientation: Right;Posterior;Medial Wound Description (Comments): R shin posterior medial  Present on Admission: Yes   Dressing Type Impregnated gauze (bismuth)  silverfiber dressing to wound bed followed by  profore    Dressing Changed Changed   Dressing Status Old drainage   Dressing Change Frequency Monday, Wednesday, Friday   % Wound base Red or Granulating 70%   % Wound  base Yellow 30%   Peri-wound Assessment Intact;Induration   Drainage Amount Minimal   Drainage Description Serous;Odor   Treatment Cleansed;Debridement (Selective)   Wound Properties Date First Assessed: 08/08/15 Location: Toe (Comment  which one) Location Orientation: Left;Other (Comment) , 2nd toe   Wound Description (Comments): 2nd toe  Present on Admission: Yes   Dressing Type Impregnated gauze (bismuth)   Dressing Changed Changed   Dressing Status Old drainage   Dressing Change Frequency PRN   Site / Wound Assessment Yellow;Granulation tissue   % Wound base Red or Granulating 30%   % Wound base Yellow 70%   % Wound base Black 0%   Peri-wound Assessment Intact;Edema   Margins Other  (Comment)   Closure None   Drainage Amount Minimal   Drainage Description Serous;Odor   Wound Properties Date First Assessed: 08/08/15 Location: Toe (Comment  which one) Location Orientation: Left Wound Description (Comments): 3rd toe left  Present on Admission: Yes   Dressing Type Impregnated gauze (bismuth)   Dressing Changed Changed   Dressing Status None   Dressing Change Frequency PRN   Site / Wound Assessment Yellow   % Wound base Red or Granulating 60%   % Wound base Yellow 40%   % Wound base Black 0%   Margins --   Closure --   Drainage Amount Minimal   Drainage Description Serous;Odor   Treatment Cleansed;Debridement (Selective)   Wound Properties Date First Assessed: 08/17/15 Location: Other (Comment) , L antero medial shin   Location Orientation: Left;Anterior;Medial , L antero medial shin   Present on Admission: Yes   Dressing Type Silver hydrofiber;Impregnated gauze (bismuth)   Dressing Changed Changed   Dressing Status Old drainage   % Wound base Red or Granulating 70%   % Wound base Yellow 30%   Peri-wound Assessment Intact;Induration   Closure --   Drainage Amount Scant   Drainage Description Serous;Odor   Treatment Cleansed;Debridement (Selective)   Wound Properties Date First Assessed: 08/17/15 Location: Other (Comment) , L posterior medial 1  Location Orientation: Left;Posterior;Medial , L posterior medial 1   Present on Admission: Yes   Dressing Type Impregnated gauze (bismuth)   Dressing Changed Changed   Dressing Status Old drainage   % Wound base Red or Granulating 80%   % Wound base Yellow 20%   Peri-wound Assessment Intact;Induration   Closure None   Drainage Amount Scant   Drainage Description Serous   Treatment Cleansed;Debridement (Selective)   Wound Properties Date First Assessed: 08/17/15 Location: Other (Comment) , L posterior medial 2   Location Orientation: Left;Posterior;Medial;Other (Comment) , L posterior medial 2   Present on Admission: Yes  Final Assessment Date: 08/31/15 Final Assessment Time: 1725   Dressing Type --   Dressing Status --   % Wound base Red or Granulating --   % Wound base Yellow --   % Wound base Black --   Closure --   Drainage Amount --   Drainage Description --   Selective Debridement - Location All wounds and dry skin perimeter   Selective Debridement - Tools Used Forceps   Selective Debridement - Tissue Removed Slough, dry skin   Wound Therapy - Clinical Statement Pt responded well to profore.  Discussed with evaluating therapist and believe LE's have decongested enough without ordering bandages.  Order for compression stockings sent to [facility] and awaiting insurance approval and sceduling of fitting.  Wounds are healing nicely with increasing approximation.  Debrided edges of wounds and removed more hyperkeratosis from  LE's and toes to improve skin integrity.  Changed dressing to xeroform as overall reduction in drainage and improving granulation.    Wound Therapy - Functional Problem List non-healing wound    Factors Delaying/Impairing Wound Healing Altered sensation;Diabetes Mellitus;Polypharmacy;Vascular compromise   Wound Therapy - Frequency Other (comment)   Wound Therapy - Current Recommendations PT   Wound Plan debridement, silver and xeroform as appropriate, Profore Lites for compression, lymph eval    Dressing  xeroform to all wounds, toebandages, profore system   Manual Therapy Manual lymph drainage for bilateral LE's with inguinal axillary anastomosis bilaterally.  manual completed anteriorly only   Decrease Necrotic Tissue to 30%   Decrease Necrotic Tissue - Progress Progressing toward goal   Increase Granulation Tissue to 70%   Increase Granulation Tissue - Progress Progressing toward goal   Decrease Length/Width/Depth by (cm) at least 60% closure all wounds    Decrease Length/Width/Depth - Progress Progressing toward goal   Improve Drainage Characteristics --  New STG:  2 weeks:  Pt  to be I in ex to improve lymph flow.    Patient/Family will be able to  self-dress wounds and recognize signs of infection   New: 08/23/15 STG: 2 wk able to identify signs of cellulitis   Patient/Family Instruction Goal - Progress --  pt unable to do this   Additional Wound Therapy Goal full wound closure all wounds   new: LTG: 4 week: 30% fluid reduction for improved mobility   Additional Wound Therapy Goal - Progress Progressing toward goal   Goals/treatment plan/discharge plan were made with and agreed upon by patient/family Yes   Time For Goal Achievement Other (comment)   Wound Therapy - Potential for Goals Good                 PT Education - 08/31/15 1732    Education provided Yes   Education Details educated on lymphedema and use of compression garments.     Person(s) Educated Patient   Methods Explanation   Comprehension Verbalized understanding          Problem List Patient Active Problem List   Diagnosis Date Noted  . Uterovaginal prolapse, complete 03/26/2013  . Diabetic foot ulcers (HCC) 02/16/2013  . Hyperkalemia 06/24/2012  . Hyperlipidemia 05/20/2012  . Uterine prolapse 05/19/2012  . Chronic kidney disease, stage 4, severely decreased GFR (HCC)   . Hypertension   . Anemia associated with chronic renal failure   . Hypothyroid   . CAROTID BRUIT, LEFT 05/11/2009  . DIABETES MELLITUS 03/11/2009  . OVERWEIGHT/OBESITY 03/11/2009    Lurena Nida, PTA/CLT [phone] 08/31/2015, 5:39 PM  [facility] [address] [place], [place], [address] Phone: [phone]   Fax:  [phone]  Name: [name] MRN: [id number] Date of Birth: 30-Sep-1931

## 2015-09-01 ENCOUNTER — Encounter (HOSPITAL_COMMUNITY)
Admission: RE | Admit: 2015-09-01 | Discharge: 2015-09-01 | Disposition: A | Discharge status: Home / Self Care | Payer: Medicare Other | Source: Ambulatory Visit | Attending: Nephrology | Admitting: Nephrology

## 2015-09-01 DIAGNOSIS — D631 Anemia in chronic kidney disease: Secondary | ICD-10-CM | POA: Diagnosis not present

## 2015-09-01 DIAGNOSIS — N183 Chronic kidney disease, stage 3 (moderate): Secondary | ICD-10-CM | POA: Diagnosis not present

## 2015-09-01 LAB — POCT HEMOGLOBIN-HEMACUE: HEMOGLOBIN: 10.3 g/dL — AB (ref 12.0–15.0)

## 2015-09-01 MED ORDER — EPOETIN ALFA 20000 UNIT/ML IJ SOLN
20000.0000 [IU] | INTRAMUSCULAR | Status: DC
  Administered 2015-09-01: 20000 [IU] via SUBCUTANEOUS

## 2015-09-01 MED ORDER — EPOETIN ALFA 20000 UNIT/ML IJ SOLN
INTRAMUSCULAR | Status: AC
  Administered 2015-09-01: 20000 [IU] via SUBCUTANEOUS
  Filled 2015-09-01: qty 1

## 2015-09-06 ENCOUNTER — Ambulatory Visit (HOSPITAL_COMMUNITY): Payer: Medicare Other | Admitting: Physical Therapy

## 2015-09-06 ENCOUNTER — Ambulatory Visit (HOSPITAL_COMMUNITY): Payer: Medicare Other

## 2015-09-06 DIAGNOSIS — S91109D Unspecified open wound of unspecified toe(s) without damage to nail, subsequent encounter: Secondary | ICD-10-CM | POA: Diagnosis not present

## 2015-09-06 DIAGNOSIS — S81802D Unspecified open wound, left lower leg, subsequent encounter: Secondary | ICD-10-CM

## 2015-09-06 DIAGNOSIS — S81801D Unspecified open wound, right lower leg, subsequent encounter: Secondary | ICD-10-CM

## 2015-09-06 DIAGNOSIS — R262 Difficulty in walking, not elsewhere classified: Secondary | ICD-10-CM | POA: Diagnosis not present

## 2015-09-06 NOTE — Therapy (Signed)
[facility] [address] [place], [place], [address] Phone: [phone]   Fax:  [phone]  Wound Care Therapy (Re-Assessment)  Patient Details  Name: [name] MRN: [id number] Date of Birth: Sep 10, 1932 No Data Recorded  Encounter Date: 09/06/2015      PT End of Session - 09/06/15 1753    Visit Number 8   Number of Visits 16   Date for PT Re-Evaluation 10/08/15   Authorization Type UHC Medicare and medicaid    Authorization Time Period 08/08/15 to 10/08/15   Authorization - Visit Number 8   Authorization - Number of Visits 18   PT Start Time 1647   PT Stop Time 1745   PT Time Calculation (min) 58 min   Activity Tolerance Patient tolerated treatment well   Behavior During Therapy [facility] for tasks assessed/performed      Past Medical History  Diagnosis Date  . CHF (congestive heart failure)      preserved left ventricular systolic function  . Hypertension   . Overweight(278.02)   . Hypothyroid   . Vaginal pessary present ~ 2012  . CKD (chronic kidney disease) stage 4, GFR 15-29 ml/min     [name] 02/16/2013  . Anemia associated with chronic renal failure     ESA therapy  . History of blood transfusion 1975  . Rheumatoid arthritis   . Osteoporosis   . Pneumonia 02/2011    [name] 02/20/2011 (02/16/2013)  . Diabetic foot ulcers     "get them on borh feet" (02/16/2013)  . Shortness of breath dyspnea     with exertion  . Type II diabetes mellitus     patient denies    Past Surgical History  Procedure Laterality Date  . Cataract extraction w/ intraocular lens  implant, bilateral Bilateral 1990's  . Orif femur fracture Left 1975    "got a rod and screw in it" (02/16/2013)  . Av fistula placement Left 01/26/2015    Procedure: LEFT ARM ARTERIOVENOUS (AV) FISTULA CREATION;  Surgeon: [name], MD;  Location: [facility] OR;  Service: Vascular;  Laterality: Left;    There were no vitals filed for this visit.  Visit Diagnosis:   Multiple open wounds of lower leg, left, subsequent encounter  Multiple open wounds of lower leg, right, subsequent encounter  Open toe wound, subsequent encounter  Difficulty walking        Wound Therapy - 09/06/15 1756    Subjective No pain, pt stated the dressings were a little tight at top of leg   Patient and Family Stated Goals get all wounds healed up    Prior Treatments self treat   Pain Assessment No/denies pain   Wound Properties Date First Assessed: 08/17/15 Wound Type: Other (Comment) , R LE posterior medial   Location: Other (Comment) , R shin posterior medial   Location Orientation: Right;Posterior;Medial Wound Description (Comments): R shin posterior medial  Present on Admission: Yes   Dressing Type Impregnated gauze (bismuth)   Dressing Changed Changed   Dressing Status Old drainage   Dressing Change Frequency Monday, Wednesday, Friday   % Wound base Red or Granulating 70%   % Wound base Yellow 30%   Peri-wound Assessment Intact;Induration   Wound Length (cm) 1.8 cm   Wound Width (cm) 2.8 cm   Wound Depth (cm) 0.2 cm   Drainage Amount Minimal   Drainage Description Serous;Odor   Treatment Cleansed;Debridement (Selective)   Wound Properties Date First Assessed: 08/17/15 Location: Other (Comment) ,  L antero medial shin   Location Orientation: Left;Anterior;Medial , L antero medial shin   Present on Admission: Yes   Dressing Type Silver hydrofiber;Impregnated gauze (bismuth)   Dressing Changed Reinforced   Dressing Status Old drainage   % Wound base Red or Granulating 70%   % Wound base Yellow 30%   Peri-wound Assessment Intact;Induration   Wound Length (cm) 0.7 cm   Wound Width (cm) 0.3 cm   Wound Depth (cm) 0.1 cm   Drainage Amount Scant   Drainage Description Serous;Odor   Treatment Cleansed;Debridement (Selective)   Wound Properties Date First Assessed: 08/17/15 Location: Other (Comment) , L posterior medial 1  Location Orientation: Left;Posterior;Medial ,  L posterior medial 1   Present on Admission: Yes   Dressing Type Impregnated gauze (bismuth)   Dressing Status Old drainage   % Wound base Red or Granulating 80%   % Wound base Yellow 20%   Peri-wound Assessment Intact;Induration   Wound Length (cm) 0.5 cm   Wound Width (cm) 0.7 cm   Wound Depth (cm) 0 cm   Closure None   Drainage Amount Scant   Drainage Description Serous   Treatment Cleansed;Debridement (Selective)   Wound Properties Date First Assessed: 08/08/15 Location: Toe (Comment  which one) Location Orientation: Left;Other (Comment) , 2nd toe   Wound Description (Comments): 2nd toe  Present on Admission: Yes   Dressing Type Impregnated gauze (bismuth)   Dressing Changed Changed   Dressing Status Old drainage   Dressing Change Frequency PRN   Site / Wound Assessment Yellow;Granulation tissue   % Wound base Red or Granulating 40%   % Wound base Yellow 60%   Peri-wound Assessment Intact;Edema   Wound Length (cm) 3.8 cm   Wound Width (cm) 2.4 cm   Wound Depth (cm) 0.3 cm   Margins Epibole (rolled edges)   Closure None   Drainage Amount Minimal   Drainage Description Serous;Odor   Treatment Cleansed;Debridement (Selective)   Wound Properties Date First Assessed: 08/08/15 Location: Toe (Comment  which one) Location Orientation: Left Wound Description (Comments): 3rd toe left  Present on Admission: Yes   Dressing Type Impregnated gauze (bismuth)   Dressing Changed New   Dressing Status None   Dressing Change Frequency PRN   Site / Wound Assessment Yellow   % Wound base Red or Granulating 60%   % Wound base Yellow 40%   Wound Length (cm) 0.4 cm   Wound Width (cm) 0.3 cm   Margins Unattached edges (unapproximated)   Closure None   Drainage Amount Minimal   Drainage Description Serous;Odor   Treatment Cleansed;Debridement (Selective)   Selective Debridement - Location All wounds and dry skin perimeter   Selective Debridement - Tools Used Forceps   Selective Debridement -  Tissue Removed Slough, dry skin   Wound Therapy - Clinical Statement Wounds continue to make positive progress with xeroform and profore wrapping for edema control.  Measurements taken with reductions in overall size and depth.  No reports of pain through session.     Wound Therapy - Functional Problem List non-healing wound    Factors Delaying/Impairing Wound Healing Altered sensation;Diabetes Mellitus;Polypharmacy;Vascular compromise   Wound Therapy - Frequency Other (comment)   Wound Therapy - Current Recommendations PT   Wound Plan Recommend continuing PT POC with debridement, silver and xeroform as appropriate, Profore Lites for compression, lymph eval    Dressing  xeroform to all wounds, toebandages, profore system   Decrease Necrotic Tissue to 30%   Decrease Necrotic Tissue -  Progress Progressing toward goal   Increase Granulation Tissue to 70%   Increase Granulation Tissue - Progress Progressing toward goal   Decrease Length/Width/Depth by (cm) at least 60% closure all wounds    Decrease Length/Width/Depth - Progress Progressing toward goal   Improve Drainage Characteristics --  New STG: 2 weeks: Pt to be I in ex to improve lymph flow   Patient/Family will be able to  self-dress wounds and recognize signs of infection   New: 08/23/15 STG: 2 wk able to identify signs of cellulitis   Patient/Family Instruction Goal - Progress --  unable to do   Additional Wound Therapy Goal full wound closure all wounds   New LTG:4 wks: 30% fluid reduction for improved mobilty   Additional Wound Therapy Goal - Progress Progressing toward goal   Goals/treatment plan/discharge plan were made with and agreed upon by patient/family Yes   Time For Goal Achievement Other (comment)   Wound Therapy - Potential for Goals Good      Problem List Patient Active Problem List   Diagnosis Date Noted  . Uterovaginal prolapse, complete 03/26/2013  . Diabetic foot ulcers (HCC) 02/16/2013  . Hyperkalemia  06/24/2012  . Hyperlipidemia 05/20/2012  . Uterine prolapse 05/19/2012  . Chronic kidney disease, stage 4, severely decreased GFR (HCC)   . Hypertension   . Anemia associated with chronic renal failure   . Hypothyroid   . CAROTID BRUIT, LEFT 05/11/2009  . DIABETES MELLITUS 03/11/2009  . OVERWEIGHT/OBESITY 03/11/2009   Becky Sax, LPTA; CBIS [phone]  G-CODES 2015/09/17 Other PT primary Based on wound healing status, slough, concurrent factors Current CK Goal CJ   Physical Therapy Progress Note  Dates of Reporting Period: 08/08/15 to 09-17-2015  Objective Reports of Subjective Statement: see above   Objective Measurements: see above   Goal Update: see above   Plan: see above   Reason Skilled Services are Required: facilitate wound healing, prevent infection, enhance self-care skills regarding wound development and care     Nedra Hai PT, DPT [phone]  [facility] [address] [place], [place], [address] Phone: [phone]   Fax:  [phone]  Name: [name] MRN: [id number] Date of Birth: 1931/12/02

## 2015-09-07 DIAGNOSIS — R262 Difficulty in walking, not elsewhere classified: Secondary | ICD-10-CM | POA: Diagnosis not present

## 2015-09-07 DIAGNOSIS — S81802D Unspecified open wound, left lower leg, subsequent encounter: Secondary | ICD-10-CM | POA: Diagnosis not present

## 2015-09-07 DIAGNOSIS — S81801D Unspecified open wound, right lower leg, subsequent encounter: Secondary | ICD-10-CM | POA: Diagnosis not present

## 2015-09-07 DIAGNOSIS — S91109D Unspecified open wound of unspecified toe(s) without damage to nail, subsequent encounter: Secondary | ICD-10-CM | POA: Diagnosis not present

## 2015-09-08 ENCOUNTER — Ambulatory Visit (HOSPITAL_COMMUNITY): Payer: Medicare Other | Admitting: Physical Therapy

## 2015-09-08 DIAGNOSIS — S81802D Unspecified open wound, left lower leg, subsequent encounter: Secondary | ICD-10-CM

## 2015-09-08 DIAGNOSIS — S81801D Unspecified open wound, right lower leg, subsequent encounter: Secondary | ICD-10-CM | POA: Diagnosis not present

## 2015-09-08 DIAGNOSIS — R262 Difficulty in walking, not elsewhere classified: Secondary | ICD-10-CM | POA: Diagnosis not present

## 2015-09-08 DIAGNOSIS — S91109D Unspecified open wound of unspecified toe(s) without damage to nail, subsequent encounter: Secondary | ICD-10-CM | POA: Diagnosis not present

## 2015-09-09 ENCOUNTER — Encounter (HOSPITAL_COMMUNITY)
Admission: RE | Admit: 2015-09-09 | Discharge: 2015-09-09 | Disposition: A | Discharge status: Home / Self Care | Payer: Medicare Other | Source: Ambulatory Visit | Attending: Nephrology | Admitting: Nephrology

## 2015-09-09 ENCOUNTER — Ambulatory Visit (HOSPITAL_COMMUNITY): Payer: Medicare Other

## 2015-09-09 DIAGNOSIS — N183 Chronic kidney disease, stage 3 (moderate): Secondary | ICD-10-CM | POA: Diagnosis not present

## 2015-09-09 DIAGNOSIS — D631 Anemia in chronic kidney disease: Secondary | ICD-10-CM | POA: Diagnosis not present

## 2015-09-09 LAB — IRON AND TIBC
Iron: 41 ug/dL (ref 28–170)
Saturation Ratios: 21 % (ref 10.4–31.8)
TIBC: 193 ug/dL — ABNORMAL LOW (ref 250–450)
UIBC: 152 ug/dL

## 2015-09-09 LAB — RENAL FUNCTION PANEL
ALBUMIN: 3.1 g/dL — AB (ref 3.5–5.0)
Anion gap: 13 (ref 5–15)
BUN: 68 mg/dL — AB (ref 6–20)
CO2: 21 mmol/L — ABNORMAL LOW (ref 22–32)
CREATININE: 4 mg/dL — AB (ref 0.44–1.00)
Calcium: 9.2 mg/dL (ref 8.9–10.3)
Chloride: 108 mmol/L (ref 101–111)
GFR calc Af Amer: 11 mL/min — ABNORMAL LOW (ref >60)
GFR calc non Af Amer: 10 mL/min — ABNORMAL LOW (ref >60)
GLUCOSE: 155 mg/dL — AB (ref 65–99)
PHOSPHORUS: 3.7 mg/dL (ref 2.5–4.6)
POTASSIUM: 4.1 mmol/L (ref 3.5–5.1)
Sodium: 142 mmol/L (ref 135–145)

## 2015-09-09 LAB — FERRITIN: Ferritin: 938 ng/mL — ABNORMAL HIGH (ref 11–307)

## 2015-09-09 MED ORDER — EPOETIN ALFA 20000 UNIT/ML IJ SOLN
INTRAMUSCULAR | Status: AC
  Filled 2015-09-09: qty 1

## 2015-09-09 MED ORDER — EPOETIN ALFA 20000 UNIT/ML IJ SOLN
20000.0000 [IU] | INTRAMUSCULAR | Status: DC
  Administered 2015-09-09: 20000 [IU] via SUBCUTANEOUS

## 2015-09-13 ENCOUNTER — Ambulatory Visit (HOSPITAL_COMMUNITY): Payer: Medicare Other | Attending: Family Medicine | Admitting: Physical Therapy

## 2015-09-13 DIAGNOSIS — X58XXXD Exposure to other specified factors, subsequent encounter: Secondary | ICD-10-CM | POA: Insufficient documentation

## 2015-09-13 DIAGNOSIS — S91109D Unspecified open wound of unspecified toe(s) without damage to nail, subsequent encounter: Secondary | ICD-10-CM | POA: Diagnosis not present

## 2015-09-13 DIAGNOSIS — S81801D Unspecified open wound, right lower leg, subsequent encounter: Secondary | ICD-10-CM | POA: Diagnosis not present

## 2015-09-13 DIAGNOSIS — S81802D Unspecified open wound, left lower leg, subsequent encounter: Secondary | ICD-10-CM | POA: Diagnosis not present

## 2015-09-13 DIAGNOSIS — I890 Lymphedema, not elsewhere classified: Secondary | ICD-10-CM | POA: Insufficient documentation

## 2015-09-13 DIAGNOSIS — R262 Difficulty in walking, not elsewhere classified: Secondary | ICD-10-CM | POA: Diagnosis not present

## 2015-09-13 LAB — POCT HEMOGLOBIN-HEMACUE: HEMOGLOBIN: 10.7 g/dL — AB (ref 12.0–15.0)

## 2015-09-13 NOTE — Therapy (Signed)
[facility] [address] [place], [place], [address] Phone: [phone]   Fax:  [phone]  Wound Care Therapy  Patient Details  Name: [name] MRN: [id number] Date of Birth: [birth year] No Data Recorded  Encounter Date: 09/13/2015    Past Medical History  Diagnosis Date  . CHF (congestive heart failure)      preserved left ventricular systolic function  . Hypertension   . Overweight(278.02)   . Hypothyroid   . Vaginal pessary present ~ 2012  . CKD (chronic kidney disease) stage 4, GFR 15-29 ml/min     [name] 02/16/2013  . Anemia associated with chronic renal failure     ESA therapy  . History of blood transfusion 1975  . Rheumatoid arthritis   . Osteoporosis   . Pneumonia 02/2011    [name] 02/20/2011 (02/16/2013)  . Diabetic foot ulcers     "get them on borh feet" (02/16/2013)  . Shortness of breath dyspnea     with exertion  . Type II diabetes mellitus     patient denies    Past Surgical History  Procedure Laterality Date  . Cataract extraction w/ intraocular lens  implant, bilateral Bilateral 1990's  . Orif femur fracture Left 1975    "got a rod and screw in it" (02/16/2013)  . Av fistula placement Left 01/26/2015    Procedure: LEFT ARM ARTERIOVENOUS (AV) FISTULA CREATION;  Surgeon: [name], MD;  Location: [facility] OR;  Service: Vascular;  Laterality: Left;    There were no vitals filed for this visit.  Visit Diagnosis:  Multiple open wounds of lower leg, right, subsequent encounter  Multiple open wounds of lower leg, left, subsequent encounter  Open toe wound, subsequent encounter  Difficulty walking                 Wound Therapy - 09/13/15 1811    Subjective Pt states she almost removed the dressings as they were too tight.   Patient and Family Stated Goals get all wounds healed up    Prior Treatments self treat   Pain Assessment No/denies pain   Wound Properties Date First Assessed: 08/17/15 Wound  Type: Other (Comment) , R LE posterior medial   Location: Other (Comment) , R shin posterior medial   Location Orientation: Right;Posterior;Medial Wound Description (Comments): R shin posterior medial  Present on Admission: Yes   Dressing Type Impregnated gauze (bismuth)   Dressing Changed Changed   Dressing Status Old drainage   Dressing Change Frequency Monday, Wednesday, Friday   % Wound base Red or Granulating 75%   % Wound base Yellow 25%   Peri-wound Assessment Intact   Drainage Amount Minimal   Drainage Description Serous;Odor   Treatment Cleansed;Debridement (Selective)   Wound Properties Date First Assessed: 08/17/15 Location: Other (Comment) , L antero medial shin   Location Orientation: Left;Anterior;Medial , L antero medial shin   Present on Admission: Yes   Dressing Type Impregnated gauze (bismuth)   Dressing Changed Changed   Dressing Status Old drainage   % Wound base Red or Granulating 70%   % Wound base Yellow 30%   Peri-wound Assessment Intact;Induration   Drainage Amount Scant   Drainage Description Serous;Odor   Treatment Cleansed;Debridement (Selective)   Wound Properties Date First Assessed: 08/17/15 Location: Other (Comment) , L posterior medial 1  Location Orientation: Left;Posterior;Medial , L posterior medial 1   Present on Admission: Yes   Dressing Type Impregnated gauze (bismuth)   Dressing Changed Changed  Dressing Status Old drainage   % Wound base Red or Granulating 90%   % Wound base Yellow 10%   Peri-wound Assessment Intact;Induration   Closure None   Drainage Amount Scant   Drainage Description Serous   Treatment Cleansed;Debridement (Selective)   Wound Properties Date First Assessed: 08/08/15 Location: Toe (Comment  which one) Location Orientation: Left;Other (Comment) , 2nd toe   Wound Description (Comments): 2nd toe  Present on Admission: Yes   Dressing Type Impregnated gauze (bismuth)   Dressing Changed Changed   Dressing Status Old drainage    Dressing Change Frequency PRN   Site / Wound Assessment Yellow;Granulation tissue   % Wound base Red or Granulating 50%   % Wound base Yellow 50%   Peri-wound Assessment Intact   Margins --   Closure None   Drainage Amount Minimal   Drainage Description Serous;Odor   Treatment Cleansed;Debridement (Selective)   Wound Properties Date First Assessed: 08/08/15 Location: Toe (Comment  which one) Location Orientation: Left Wound Description (Comments): 3rd toe left  Present on Admission: Yes   Dressing Type Impregnated gauze (bismuth)   Dressing Changed Changed   Dressing Status None   Dressing Change Frequency PRN   Site / Wound Assessment Yellow   % Wound base Red or Granulating 70%   % Wound base Yellow 30%   Margins Unattached edges (unapproximated)   Closure None   Drainage Amount Minimal   Drainage Description Serous;Odor   Treatment Cleansed;Debridement (Selective)   Selective Debridement - Location All wounds and dry skin perimeter   Selective Debridement - Tools Used Forceps   Selective Debridement - Tissue Removed Slough, dry skin   Wound Therapy - Clinical Statement Much improvment in wounds with decreased edema.  Removed more hyperkeratotic skin from dorsal feet to encourage approximation of wounds.  PT without pain and no longer with tenderness in toes.  continued with toe bandages to reduce edema.    Wound Therapy - Functional Problem List non-healing wound    Factors Delaying/Impairing Wound Healing Altered sensation;Diabetes Mellitus;Polypharmacy;Vascular compromise   Wound Therapy - Frequency Other (comment)   Wound Therapy - Current Recommendations PT   Wound Plan Recommend continuing PT POC with debridement, silver and xeroform as appropriate, Profore Lites for compression, lymph eval    Dressing  xeroform to all wounds, toebandages, profore system   Decrease Necrotic Tissue to 30%   Increase Granulation Tissue to 70%   Decrease Length/Width/Depth by (cm) at least  60% closure all wounds    Improve Drainage Characteristics --  New STG: 2 weeks: Pt to be I in ex to improve lymph flow   Patient/Family will be able to  self-dress wounds and recognize signs of infection   New: 08/23/15 STG: 2 wk able to identify signs of cellulitis   Additional Wound Therapy Goal full wound closure all wounds   New LTG:4 wks: 30% fluid reduction for improved mobilty   Goals/treatment plan/discharge plan were made with and agreed upon by patient/family Yes   Time For Goal Achievement Other (comment)   Wound Therapy - Potential for Goals Good                              Problem List Patient Active Problem List   Diagnosis Date Noted  . Uterovaginal prolapse, complete 03/26/2013  . Diabetic foot ulcers (HCC) 02/16/2013  . Hyperkalemia 06/24/2012  . Hyperlipidemia 05/20/2012  . Uterine prolapse 05/19/2012  . Chronic kidney  disease, stage 4, severely decreased GFR (HCC)   . Hypertension   . Anemia associated with chronic renal failure   . Hypothyroid   . CAROTID BRUIT, LEFT 05/11/2009  . DIABETES MELLITUS 03/11/2009  . OVERWEIGHT/OBESITY 03/11/2009    Lurena Nida, PTA/CLT [phone]  09/13/2015, 6:18 PM  [facility] [address] [place], [place], [address] Phone: [phone]   Fax:  [phone]  Name: DUANNA RUNK MRN: [id number] Date of Birth: August 03, 1932

## 2015-09-14 NOTE — Therapy (Addendum)
[facility] [address] [place], [place], [address] Phone: [phone]   Fax:  [phone]  Wound Care Therapy  Patient Details  Name: [name] MRN: [id number] Date of Birth: [birth year] No Data Recorded  Encounter Date: 09/08/2015      PT End of Session - 09/14/15 0906    Visit Number 9   Number of Visits 16   Date for PT Re-Evaluation 10/08/15   Authorization Type UHC Medicare and medicaid    Authorization Time Period 08/08/15 to 10/08/15   Authorization - Visit Number 9   Authorization - Number of Visits 19   PT Start Time 1345   PT Stop Time 1440   PT Time Calculation (min) 55 min   Activity Tolerance Patient tolerated treatment well   Behavior During Therapy [facility] for tasks assessed/performed      Past Medical History  Diagnosis Date  . CHF (congestive heart failure)      preserved left ventricular systolic function  . Hypertension   . Overweight(278.02)   . Hypothyroid   . Vaginal pessary present ~ 2012  . CKD (chronic kidney disease) stage 4, GFR 15-29 ml/min     [name] 02/16/2013  . Anemia associated with chronic renal failure     ESA therapy  . History of blood transfusion 1975  . Rheumatoid arthritis   . Osteoporosis   . Pneumonia 02/2011    [name] 02/20/2011 (02/16/2013)  . Diabetic foot ulcers     "get them on borh feet" (02/16/2013)  . Shortness of breath dyspnea     with exertion  . Type II diabetes mellitus     patient denies    Past Surgical History  Procedure Laterality Date  . Cataract extraction w/ intraocular lens  implant, bilateral Bilateral 1990's  . Orif femur fracture Left 1975    "got a rod and screw in it" (02/16/2013)  . Av fistula placement Left 01/26/2015    Procedure: LEFT ARM ARTERIOVENOUS (AV) FISTULA CREATION;  Surgeon: [name], MD;  Location: [facility] OR;  Service: Vascular;  Laterality: Left;    There were no vitals filed for this visit.  Visit Diagnosis:  Multiple open wounds of  lower leg, left, subsequent encounter  Difficulty walking                 Wound Therapy - 09/14/15 0900    Subjective --  Pt states that her legs are feeling better.    Patient and Family Stated Goals get all wounds healed up    Prior Treatments self treat   Pain Assessment No/denies pain   Wound Properties Date First Assessed: 08/17/15 Wound Type: Other (Comment) , R LE posterior medial   Location: Other (Comment) , R shin posterior medial   Location Orientation: Right;Posterior;Medial Wound Description (Comments): R shin posterior medial  Present on Admission: Yes   Dressing Type Impregnated gauze (bismuth)   Dressing Status Old drainage   Dressing Change Frequency Monday, Wednesday, Friday   % Wound base Red or Granulating 75%   % Wound base Yellow 25%   Peri-wound Assessment Intact   Drainage Amount Minimal   Drainage Description Serous;Odor   Treatment Cleansed;Other (Comment)  manual techniques to decrease swelling; multilayer bandaging   Wound Properties Date First Assessed: 08/17/15 Location: Other (Comment) , L antero medial shin   Location Orientation: Left;Anterior;Medial , L antero medial shin   Present on Admission: Yes   Dressing Type Impregnated gauze (bismuth)  Dressing Status Old drainage   % Wound base Red or Granulating 70%   % Wound base Yellow 30%   Peri-wound Assessment Intact;Induration   Drainage Amount Scant   Drainage Description Serous;Odor   Wound Properties Date First Assessed: 08/17/15 Location: Other (Comment) , L posterior medial 1  Location Orientation: Left;Posterior;Medial , L posterior medial 1   Present on Admission: Yes   Dressing Type Impregnated gauze (bismuth)   Dressing Status Old drainage   % Wound base Red or Granulating 90%   % Wound base Yellow 10%   Peri-wound Assessment Intact;Induration   Closure None   Drainage Amount Scant   Drainage Description Serous   Treatment Cleansed;Other (Comment)  manual techniqes  followed by multilayer compression bandage   Wound Properties Date First Assessed: 08/08/15 Location: Toe (Comment  which one) Location Orientation: Left;Other (Comment) , 2nd toe   Wound Description (Comments): 2nd toe  Present on Admission: Yes   Dressing Type Impregnated gauze (bismuth)   Dressing Changed Changed   Dressing Status Old drainage   Dressing Change Frequency PRN   Site / Wound Assessment Yellow;Granulation tissue   % Wound base Red or Granulating 50%   % Wound base Yellow 50%   Peri-wound Assessment Intact   Closure None   Drainage Amount Minimal   Drainage Description Serous;Odor   Treatment Cleansed   Wound Properties Date First Assessed: 08/08/15 Location: Toe (Comment  which one) Location Orientation: Left Wound Description (Comments): 3rd toe left  Present on Admission: Yes   Dressing Type Impregnated gauze (bismuth)   Dressing Status None   Dressing Change Frequency PRN   Site / Wound Assessment Yellow   % Wound base Red or Granulating 70%   % Wound base Yellow 30%   Margins Unattached edges (unapproximated)   Closure None   Drainage Amount Minimal   Drainage Description Serous;Odor   Treatment Cleansed   Selective Debridement - Location All wounds and dry skin perimeter   Selective Debridement - Tools Used Forceps   Selective Debridement - Tissue Removed Slough, dry skin   Wound Therapy - Clinical Statement Much improvment in wounds with decreased edema.  Removed more hyperkeratotic skin from dorsal feet to encourage approximation of wounds.  PT without pain and no longer with tenderness in toes.  continued with toe bandages to reduce edema.    Wound Therapy - Functional Problem List non-healing wound    Factors Delaying/Impairing Wound Healing Altered sensation;Diabetes Mellitus;Polypharmacy;Vascular compromise   Wound Therapy - Frequency Other (comment)   Wound Therapy - Current Recommendations PT   Wound Plan manual if time allows with cleansing and profore  dressing    Dressing  xeroform to all wounds, toebandages, profore system   Decrease Necrotic Tissue to 30%   Increase Granulation Tissue to 70%   Decrease Length/Width/Depth by (cm) at least 60% closure all wounds    Improve Drainage Characteristics --  New STG: 2 weeks: Pt to be I in ex to improve lymph flow   Patient/Family will be able to  self-dress wounds and recognize signs of infection   New: 08/23/15 STG: 2 wk able to identify signs of cellulitis   Additional Wound Therapy Goal full wound closure all wounds   New LTG:4 wks: 30% fluid reduction for improved mobilty   Goals/treatment plan/discharge plan were made with and agreed upon by patient/family Yes   Time For Goal Achievement Other (comment)   Wound Therapy - Potential for Goals Good  Problem List Patient Active Problem List   Diagnosis Date Noted  . Uterovaginal prolapse, complete 03/26/2013  . Diabetic foot ulcers (HCC) 02/16/2013  . Hyperkalemia 06/24/2012  . Hyperlipidemia 05/20/2012  . Uterine prolapse 05/19/2012  . Chronic kidney disease, stage 4, severely decreased GFR (HCC)   . Hypertension   . Anemia associated with chronic renal failure   . Hypothyroid   . CAROTID BRUIT, LEFT 05/11/2009  . DIABETES MELLITUS 03/11/2009  . OVERWEIGHT/OBESITY 03/11/2009    Virgina Organ, PT CLT [phone] 09/14/2015, 9:08 AM  [facility] [address] [place], [place], [address] Phone: [phone]   Fax:  [phone]  Name: LILYROSE TANNEY MRN: [id number] Date of Birth: 1932/07/23

## 2015-09-15 ENCOUNTER — Ambulatory Visit (HOSPITAL_COMMUNITY): Payer: Medicare Other | Admitting: Physical Therapy

## 2015-09-15 DIAGNOSIS — R262 Difficulty in walking, not elsewhere classified: Secondary | ICD-10-CM

## 2015-09-15 DIAGNOSIS — I890 Lymphedema, not elsewhere classified: Secondary | ICD-10-CM | POA: Diagnosis not present

## 2015-09-15 DIAGNOSIS — S91109D Unspecified open wound of unspecified toe(s) without damage to nail, subsequent encounter: Secondary | ICD-10-CM | POA: Diagnosis not present

## 2015-09-15 DIAGNOSIS — S81801D Unspecified open wound, right lower leg, subsequent encounter: Secondary | ICD-10-CM

## 2015-09-15 DIAGNOSIS — S81802D Unspecified open wound, left lower leg, subsequent encounter: Secondary | ICD-10-CM | POA: Diagnosis not present

## 2015-09-15 NOTE — Therapy (Signed)
[place], [place], [address] Phone: [phone]   Fax:  [phone]  Wound Care Therapy  Patient Details  Name: [name] MRN: [id number] Date of Birth: 11-24-1931 No Data Recorded  Encounter Date: 09/15/2015      PT End of Session - 09/15/15 1550    Visit Number 10   Number of Visits 18   Date for PT Re-Evaluation 10/08/15   Authorization Type UHC Medicare and medicaid  (gcode updated visit #8)   Authorization Time Period 08/08/15 to 10/08/15   Authorization - Visit Number 10   Authorization - Number of Visits 18   PT Start Time 1694   PT Stop Time 1345   PT Time Calculation (min) 75 min   Activity Tolerance Patient tolerated treatment well   Behavior During Therapy [facility] for tasks assessed/performed      Past Medical History  Diagnosis Date  . CHF (congestive heart failure)      preserved left ventricular systolic function  . Hypertension   . Overweight(278.02)   . Hypothyroid   . Vaginal pessary present ~ 2012  . CKD (chronic kidney disease) stage 4, GFR 15-29 ml/min     Archie Endo 02/16/2013  . Anemia associated with chronic renal failure     ESA therapy  . History of blood transfusion 1975  . Rheumatoid arthritis   . Osteoporosis   . Pneumonia 02/2011    Archie Endo 02/20/2011 (02/16/2013)  . Diabetic foot ulcers     "get them on borh feet" (02/16/2013)  . Shortness of breath dyspnea     with exertion  . Type II diabetes mellitus     patient denies    Past Surgical History  Procedure Laterality Date  . Cataract extraction w/ intraocular lens  implant, bilateral Bilateral 1990's  . Orif femur fracture Left 1975    "got a rod and screw in it" (02/16/2013)  . Av fistula placement Left 01/26/2015    Procedure: LEFT ARM ARTERIOVENOUS (AV) FISTULA CREATION;  Surgeon: Conrad [place]le[place]ah, MD;  Location: [facility];  Service: Vascular;  Laterality: Left;    There were no vitals filed for this visit.  Visit Diagnosis:   Multiple open wounds of lower leg, right, subsequent encounter  Multiple open wounds of lower leg, left, subsequent encounter  Open toe wound, subsequent encounter  Difficulty walking                 Wound Therapy - 09/15/15 1539    Subjective PT states her bandages felt good that were put on her Tuesday.  PT states compression garment lady called and is going to meet her here on the 10th.  Pt states that her legs are feeling better.    Patient and Family Stated Goals get all wounds healed up    Prior Treatments self treat   Pain Assessment No/denies pain   Wound Properties Date First Assessed: 08/17/15 Wound Type: Other (Comment) , R LE posterior medial   Location: Other (Comment) , R shin posterior medial   Location Orientation: Right;Posterior;Medial Wound Description (Comments): R shin posterior medial  Present on Admission: Yes   Dressing Type Impregnated gauze (bismuth)   Dressing Changed Changed   Dressing Status Old drainage   Dressing Change Frequency Monday, Wednesday, Friday   % Wound base Red or Granulating 80%   % Wound base Yellow 20%   Peri-wound Assessment Intact   Wound Length (cm) 1.5 cm   Wound Width (  cm) 1.5 cm   Wound Depth (cm) 0.1 cm   Drainage Amount Minimal   Drainage Description Serous;No odor   Treatment Cleansed;Debridement (Selective)   Wound Properties Date First Assessed: 08/17/15 Location: Other (Comment) , L antero medial shin   Location Orientation: Left;Anterior;Medial , L antero medial shin   Present on Admission: Yes   Dressing Type Impregnated gauze (bismuth)   Dressing Changed Changed   Dressing Status Old drainage   % Wound base Red or Granulating 80%   % Wound base Yellow 20%   Peri-wound Assessment Intact;Induration   Wound Length (cm) 0.5 cm   Wound Width (cm) 0.2 cm   Wound Depth (cm) 0.1 cm   Drainage Amount Scant   Drainage Description Serous;Odor   Treatment Cleansed;Debridement (Selective)   Wound Properties Date  First Assessed: 08/17/15 Location: Other (Comment) , L posterior medial 1  Location Orientation: Left;Posterior;Medial , L posterior medial 1   Present on Admission: Yes Final Assessment Date: 09/15/15 Final Assessment Time: 4782   Dressing Type --   Dressing Status --   % Wound base Red or Granulating --   % Wound base Yellow --   Peri-wound Assessment --   Closure --   Drainage Amount --   Drainage Description --   Wound Properties Date First Assessed: 08/08/15 Location: Toe (Comment  which one) Location Orientation: Left;Other (Comment) , 2nd toe   Wound Description (Comments): 2nd toe  Present on Admission: Yes   Dressing Type Impregnated gauze (bismuth)   Dressing Changed Changed   Dressing Status Old drainage   Dressing Change Frequency PRN   Site / Wound Assessment Yellow;Granulation tissue   % Wound base Red or Granulating 70%   % Wound base Yellow 30%   Peri-wound Assessment Intact   Wound Length (cm) 2 cm   Wound Width (cm) 1 cm   Wound Depth (cm) 0.2 cm   Closure None   Drainage Amount Minimal   Drainage Description Serous;Odor   Treatment Cleansed;Debridement (Selective)   Wound Properties Date First Assessed: 08/08/15 Location: Toe (Comment  which one) Location Orientation: Left Wound Description (Comments): 3rd toe left  Present on Admission: Yes   Dressing Type Impregnated gauze (bismuth)   Dressing Changed Changed   Dressing Status None   Dressing Change Frequency PRN   Site / Wound Assessment Yellow   % Wound base Red or Granulating 80%   % Wound base Yellow 20%   Margins Unattached edges (unapproximated)   Closure None   Drainage Amount Minimal   Drainage Description Serous;Odor   Treatment Cleansed;Debridement (Selective)   Selective Debridement - Location All wounds and dry skin perimeter   Selective Debridement - Tools Used Forceps   Selective Debridement - Tissue Removed Slough, dry skin   Wound Therapy - Clinical Statement Continued increase in  granulation and decreaseing in size.  Only one wound remains on the medial posterior LE's and on second toes on each side.  continued removal of hyperkeratosis to encourage approximation of wound borders.  Pt tolerated treatment well wtihout c/o pain or discomfort.  Moisturized LE's well prior to redressing with profore system.  Contiued use of xeroform and profore system.    Wound Therapy - Functional Problem List non-healing wound    Factors Delaying/Impairing Wound Healing Altered sensation;Diabetes Mellitus;Polypharmacy;Vascular compromise   Wound Therapy - Frequency Other (comment)   Wound Therapy - Current Recommendations PT   Wound Plan manual if time allows with cleansing and profore dressing    Dressing  xeroform to all wounds, toebandages, profore system   Decrease Necrotic Tissue to 30%   Decrease Necrotic Tissue - Progress Progressing toward goal   Increase Granulation Tissue to 70%   Increase Granulation Tissue - Progress Progressing toward goal   Decrease Length/Width/Depth by (cm) at least 60% closure all wounds    Decrease Length/Width/Depth - Progress Progressing toward goal   Improve Drainage Characteristics --  New STG: 2 weeks: Pt to be I in ex to improve lymph flow   Patient/Family will be able to  self-dress wounds and recognize signs of infection   New: 08/23/15 STG: 2 wk able to identify signs of cellulitis   Patient/Family Instruction Goal - Progress Partly met   Additional Wound Therapy Goal full wound closure all wounds   New LTG:4 wks: 30% fluid reduction for improved mobilty   Additional Wound Therapy Goal - Progress Progressing toward goal   Goals/treatment plan/discharge plan were made with and agreed upon by patient/family Yes   Time For Goal Achievement Other (comment)   Wound Therapy - Potential for Goals Good              Problem List Patient Active Problem List   Diagnosis Date Noted  . Uterovaginal prolapse, complete 03/26/2013  . Diabetic  foot ulcers ([place]) 02/16/2013  . Hyperkalemia 06/24/2012  . Hyperlipidemia 05/20/2012  . Uterine prolapse 05/19/2012  . Chronic kidney disease, stage 4, severely decreased GFR (HCC)   . Hypertension   . Anemia associated with chronic renal failure   . Hypothyroid   . CAROTID BRUIT, LEFT 05/11/2009  . DIABETES MELLITUS 03/11/2009  . OVERWEIGHT/OBESITY 03/11/2009    Teena Irani, PTA/CLT [phone] 09/15/2015, 3:53 PM  [facility] [address] [place], [place], [address] Phone: [phone]   Fax:  [phone]  Name: [name] MRN: [id number] Date of Birth: [birth year]

## 2015-09-16 ENCOUNTER — Emergency Department (HOSPITAL_COMMUNITY)
Admission: EM | Admit: 2015-09-16 | Discharge: 2015-09-16 | Disposition: A | Discharge status: Home / Self Care | Payer: Medicare Other | Attending: Emergency Medicine | Admitting: Emergency Medicine

## 2015-09-16 ENCOUNTER — Encounter (HOSPITAL_COMMUNITY): Payer: Self-pay | Admitting: Emergency Medicine

## 2015-09-16 ENCOUNTER — Emergency Department (HOSPITAL_COMMUNITY): Payer: Medicare Other

## 2015-09-16 ENCOUNTER — Inpatient Hospital Stay (HOSPITAL_COMMUNITY): Admission: RE | Admit: 2015-09-16 | Payer: Medicare Other | Source: Ambulatory Visit

## 2015-09-16 DIAGNOSIS — E039 Hypothyroidism, unspecified: Secondary | ICD-10-CM | POA: Insufficient documentation

## 2015-09-16 DIAGNOSIS — R1110 Vomiting, unspecified: Secondary | ICD-10-CM | POA: Diagnosis not present

## 2015-09-16 DIAGNOSIS — D631 Anemia in chronic kidney disease: Secondary | ICD-10-CM | POA: Diagnosis not present

## 2015-09-16 DIAGNOSIS — K439 Ventral hernia without obstruction or gangrene: Secondary | ICD-10-CM | POA: Insufficient documentation

## 2015-09-16 DIAGNOSIS — I509 Heart failure, unspecified: Secondary | ICD-10-CM | POA: Insufficient documentation

## 2015-09-16 DIAGNOSIS — Z8701 Personal history of pneumonia (recurrent): Secondary | ICD-10-CM | POA: Diagnosis not present

## 2015-09-16 DIAGNOSIS — R1032 Left lower quadrant pain: Secondary | ICD-10-CM | POA: Insufficient documentation

## 2015-09-16 DIAGNOSIS — M810 Age-related osteoporosis without current pathological fracture: Secondary | ICD-10-CM | POA: Diagnosis not present

## 2015-09-16 DIAGNOSIS — E119 Type 2 diabetes mellitus without complications: Secondary | ICD-10-CM | POA: Insufficient documentation

## 2015-09-16 DIAGNOSIS — N184 Chronic kidney disease, stage 4 (severe): Secondary | ICD-10-CM | POA: Insufficient documentation

## 2015-09-16 DIAGNOSIS — I129 Hypertensive chronic kidney disease with stage 1 through stage 4 chronic kidney disease, or unspecified chronic kidney disease: Secondary | ICD-10-CM | POA: Insufficient documentation

## 2015-09-16 DIAGNOSIS — Z79899 Other long term (current) drug therapy: Secondary | ICD-10-CM | POA: Insufficient documentation

## 2015-09-16 DIAGNOSIS — E663 Overweight: Secondary | ICD-10-CM | POA: Diagnosis not present

## 2015-09-16 DIAGNOSIS — R109 Unspecified abdominal pain: Secondary | ICD-10-CM | POA: Diagnosis present

## 2015-09-16 DIAGNOSIS — K5660 Unspecified intestinal obstruction: Secondary | ICD-10-CM | POA: Diagnosis not present

## 2015-09-16 LAB — COMPREHENSIVE METABOLIC PANEL
ALK PHOS: 54 U/L (ref 38–126)
ALT: 9 U/L — AB (ref 14–54)
AST: 15 U/L (ref 15–41)
Albumin: 3.6 g/dL (ref 3.5–5.0)
Anion gap: 15 (ref 5–15)
BUN: 69 mg/dL — ABNORMAL HIGH (ref 6–20)
CALCIUM: 9.3 mg/dL (ref 8.9–10.3)
CO2: 21 mmol/L — AB (ref 22–32)
CREATININE: 4.14 mg/dL — AB (ref 0.44–1.00)
Chloride: 104 mmol/L (ref 101–111)
GFR calc Af Amer: 11 mL/min — ABNORMAL LOW (ref >60)
GFR calc non Af Amer: 9 mL/min — ABNORMAL LOW (ref >60)
GLUCOSE: 164 mg/dL — AB (ref 65–99)
Potassium: 4.1 mmol/L (ref 3.5–5.1)
Sodium: 140 mmol/L (ref 135–145)
Total Bilirubin: 1.1 mg/dL (ref 0.3–1.2)
Total Protein: 7.8 g/dL (ref 6.5–8.1)

## 2015-09-16 LAB — CBC
HCT: 34 % — ABNORMAL LOW (ref 36.0–46.0)
HEMOGLOBIN: 10.7 g/dL — AB (ref 12.0–15.0)
MCH: 30.4 pg (ref 26.0–34.0)
MCHC: 31.5 g/dL (ref 30.0–36.0)
MCV: 96.6 fL (ref 78.0–100.0)
PLATELETS: 114 10*3/uL — AB (ref 150–400)
RBC: 3.52 MIL/uL — ABNORMAL LOW (ref 3.87–5.11)
RDW: 17.8 % — ABNORMAL HIGH (ref 11.5–15.5)
WBC: 7.2 10*3/uL (ref 4.0–10.5)

## 2015-09-16 LAB — URINE MICROSCOPIC-ADD ON

## 2015-09-16 LAB — URINALYSIS, ROUTINE W REFLEX MICROSCOPIC
BILIRUBIN URINE: NEGATIVE
GLUCOSE, UA: NEGATIVE mg/dL
Ketones, ur: NEGATIVE mg/dL
Nitrite: NEGATIVE
Protein, ur: 30 mg/dL — AB
SPECIFIC GRAVITY, URINE: 1.015 (ref 1.005–1.030)
pH: 6 (ref 5.0–8.0)

## 2015-09-16 LAB — LIPASE, BLOOD: LIPASE: 21 U/L (ref 11–51)

## 2015-09-16 MED ORDER — ONDANSETRON HCL 4 MG/2ML IJ SOLN
INTRAMUSCULAR | Status: AC
  Filled 2015-09-16: qty 2

## 2015-09-16 MED ORDER — HYDROMORPHONE HCL 1 MG/ML IJ SOLN
0.5000 mg | Freq: Once | INTRAMUSCULAR | Status: AC
  Administered 2015-09-16: 0.5 mg via INTRAVENOUS
  Filled 2015-09-16: qty 1

## 2015-09-16 MED ORDER — HYDROMORPHONE HCL 1 MG/ML IJ SOLN
INTRAMUSCULAR | Status: AC
  Filled 2015-09-16: qty 1

## 2015-09-16 MED ORDER — ONDANSETRON HCL 4 MG/2ML IJ SOLN
4.0000 mg | Freq: Once | INTRAMUSCULAR | Status: AC
  Administered 2015-09-16: 4 mg via INTRAVENOUS

## 2015-09-16 MED ORDER — ONDANSETRON HCL 4 MG/2ML IJ SOLN
4.0000 mg | Freq: Once | INTRAMUSCULAR | Status: AC
  Administered 2015-09-16: 4 mg via INTRAVENOUS
  Filled 2015-09-16: qty 2

## 2015-09-16 MED ORDER — HYDROMORPHONE HCL 1 MG/ML IJ SOLN: 0.5000 mg | Freq: Once | INTRAMUSCULAR | Status: DC

## 2015-09-16 MED ORDER — SODIUM CHLORIDE 0.9 % IV BOLUS (SEPSIS)
500.0000 mL | Freq: Once | INTRAVENOUS | Status: AC
  Administered 2015-09-16: 500 mL via INTRAVENOUS

## 2015-09-16 MED ORDER — CEPHALEXIN 500 MG PO CAPS: 500.0000 mg | ORAL_CAPSULE | Freq: Four times a day (QID) | ORAL | Status: DC

## 2015-09-16 MED ORDER — HYDROMORPHONE HCL 1 MG/ML IJ SOLN
0.5000 mg | Freq: Once | INTRAMUSCULAR | Status: DC
  Administered 2015-09-16: 0.5 mg via INTRAVENOUS

## 2015-09-16 MED ORDER — HYDROCODONE-ACETAMINOPHEN 5-325 MG PO TABS: 1.0000 | ORAL_TABLET | Freq: Four times a day (QID) | ORAL | Status: DC | PRN

## 2015-09-16 NOTE — ED Provider Notes (Signed)
CSN: [id number]     Arrival date & time 09/16/15  0746 History  By signing my name below, I, [name], attest that this documentation has been prepared under the direction and in the presence of Bethann Berkshire, MD. Electronically Signed: Lyndel Farley, ED Scribe. 09/16/2015. 8:10 AM.   Chief Complaint  Patient presents with  . Abdominal Pain   The history is provided by the patient. No language interpreter was used.   HPI Comments: [name] is a 82 y.o. female, with a PMhx of HTN, CKD stage IV, and DM II, who presents to the Emergency Department complaining of sudden onset, constant, moderate left-sided abdominal pain onset last night that has persisted throughout the morning with 2 episodes of emesis occurring this morning. Pt denies any past surgical history to abdomen. She also denies fever or chills.   Past Medical History  Diagnosis Date  . CHF (congestive heart failure) (HCC)      preserved left ventricular systolic function  . Hypertension   . Overweight(278.02)   . Hypothyroid   . Vaginal pessary present ~ 2012  . CKD (chronic kidney disease) stage 4, GFR 15-29 ml/min (HCC)     [name] 02/16/2013  . Anemia associated with chronic renal failure     ESA therapy  . History of blood transfusion 1975  . Rheumatoid arthritis (HCC)   . Osteoporosis   . Pneumonia 02/2011    [name] 02/20/2011 (02/16/2013)  . Diabetic foot ulcers (HCC)     "get them on borh feet" (02/16/2013)  . Shortness of breath dyspnea     with exertion  . Type II diabetes mellitus (HCC)     patient denies   Past Surgical History  Procedure Laterality Date  . Cataract extraction w/ intraocular lens  implant, bilateral Bilateral 1990's  . Orif femur fracture Left 1975    "got a rod and screw in it" (02/16/2013)  . Av fistula placement Left 01/26/2015    Procedure: LEFT ARM ARTERIOVENOUS (AV) FISTULA CREATION;  Surgeon: [name], MD;  Location: [facility] OR;  Service: Vascular;  Laterality: Left;   Family  History  Problem Relation Age of Onset  . Stroke Other   . Heart disease Other   . Kidney disease Other   . Cancer Daughter     cervical  . Cancer Grandchild     lymph nodes  . Heart disease Mother   . Diabetes Father    Social History  Substance Use Topics  . Smoking status: Never Smoker   . Smokeless tobacco: Never Used  . Alcohol Use: No   OB History    No data available     Review of Systems  Constitutional: Negative for fever and chills.  Gastrointestinal: Positive for vomiting and abdominal pain.  All other systems reviewed and are negative.  Allergies  Iodinated diagnostic agents  Home Medications   Prior to Admission medications   Medication Sig Start Date End Date Taking? Authorizing Provider  ALPRAZolam Prudy Feeler) 0.5 MG tablet Take 0.5 mg by mouth 3 (three) times daily as needed for anxiety.    Yes Historical Provider, MD  amLODipine (NORVASC) 10 MG tablet Take 10 mg by mouth daily.     Yes Historical Provider, MD  calcitRIOL (ROCALTROL) 0.25 MCG capsule Take 0.25 mcg by mouth daily.   Yes Historical Provider, MD  fish oil-omega-3 fatty acids 1000 MG capsule Take 1 capsule by mouth daily.     Yes Historical Provider, MD  levothyroxine (SYNTHROID,  LEVOTHROID) 125 MCG tablet Take 125 mcg by mouth daily before breakfast.  12/16/14  Yes Historical Provider, MD  oxyCODONE-acetaminophen (ROXICET) 5-325 MG per tablet Take 1-2 tablets by mouth every 6 (six) hours as needed for moderate pain. 01/26/15  Yes [name], MD  torsemide (DEMADEX) 20 MG tablet Take 20 mg by mouth daily.     Yes Historical Provider, MD   BP 135/59 mmHg  Pulse 81  Temp(Src) 97.3 F (36.3 C) (Oral)  Resp 18  Ht 5\' 6"  (1.676 m)  Wt 205 lb (92.987 kg)  BMI 33.10 kg/m2  SpO2 99% Physical Exam  Constitutional: She is oriented to person, place, and time. She appears well-developed.  HENT:  Head: Normocephalic.  Eyes: Conjunctivae and EOM are normal. No scleral icterus.  Neck: Neck supple. No  thyromegaly present.  Cardiovascular: Normal rate and regular rhythm.  Exam reveals no gallop and no friction rub.   No murmur heard. Pulmonary/Chest: No stridor. She has no wheezes. She has no rales. She exhibits no tenderness.  Abdominal: She exhibits no distension. There is tenderness in the suprapubic area and left lower quadrant. There is no rebound.  Moderate tenderness to suprapubic and LLQ.  Musculoskeletal: Normal range of motion. She exhibits no edema.  Lymphadenopathy:    She has no cervical adenopathy.  Neurological: She is oriented to person, place, and time. She exhibits normal muscle tone. Coordination normal.  Skin: No rash noted. No erythema.  Psychiatric: She has a normal mood and affect. Her behavior is normal.    ED Course  Procedures  DIAGNOSTIC STUDIES: Oxygen Saturation is 99% on RA, normal by my interpretation.    COORDINATION OF CARE: 8:10 AM Discussed treatment plan with pt. Will order diagnostic labs and IV fluids, antiemetics and pain medication. Pt acknowledges and agrees to plan.   Labs Review Labs Reviewed  COMPREHENSIVE METABOLIC PANEL - Abnormal; Notable for the following:    CO2 21 (*)    Glucose, Bld 164 (*)    BUN 69 (*)    Creatinine, Ser 4.14 (*)    ALT 9 (*)    GFR calc non Af Amer 9 (*)    GFR calc Af Amer 11 (*)    All other components within normal limits  CBC - Abnormal; Notable for the following:    RBC 3.52 (*)    Hemoglobin 10.7 (*)    HCT 34.0 (*)    RDW 17.8 (*)    Platelets 114 (*)    All other components within normal limits  URINALYSIS, ROUTINE W REFLEX MICROSCOPIC (NOT AT [facility]) - Abnormal; Notable for the following:    Hgb urine dipstick LARGE (*)    Protein, ur 30 (*)    Leukocytes, UA SMALL (*)    All other components within normal limits  URINE MICROSCOPIC-ADD ON - Abnormal; Notable for the following:    Squamous Epithelial / LPF 0-5 (*)    Bacteria, UA MANY (*)    All other components within normal limits   LIPASE, BLOOD   Images Review No results found.   I have personally reviewed and evaluated these images and lab results as part of my medical decision-making.   MDM   Final diagnoses:  None    The patient has a ventral hernia with small bowel in her hernia. I spoke to the surgeon he suggested trying very hard to reduce the hernia repaired patient was given pain medicine and significant pressure was used and finally the hernia  was reduced. Patient was having no abdominal discomfort. She will be discharged home on pain medicines also antibiotics for possible urinary tract infection she is a follow-up with the general surgeon next week  The chart was scribed for me under my direct supervision.  I personally performed the history, physical, and medical decision making and all procedures in the evaluation of this patient.Bethann Berkshire, MD 09/16/15 [phone]

## 2015-09-16 NOTE — Discharge Instructions (Signed)
Call Dr. Lovell Sheehan office on Monday to set up an appointment to be seen this week either Tuesday or Thursday

## 2015-09-16 NOTE — ED Notes (Signed)
Pt reports abd pain since last night with two episodes of vomiting starting last night.  Pt denies any diarrhea.

## 2015-09-16 NOTE — ED Notes (Signed)
MD at bedside. 

## 2015-09-19 LAB — URINE CULTURE: Culture: >=100000

## 2015-09-20 ENCOUNTER — Ambulatory Visit (HOSPITAL_COMMUNITY): Payer: Medicare Other | Admitting: Physical Therapy

## 2015-09-21 ENCOUNTER — Telehealth (HOSPITAL_COMMUNITY): Payer: Self-pay

## 2015-09-21 NOTE — Telephone Encounter (Signed)
Post ED Visit - Positive Culture Follow-up  Culture report reviewed by antimicrobial stewardship pharmacist:  []  , Pharm.D. []  Enzo Bi, Pharm.D., BCPS []  , Pharm.D. []  Celedonio Miyamoto, Pharm.D., BCPS []  Waucoma, Garvin Fila.D., BCPS, AAHIVP []  , Pharm.D., BCPS, AAHIVP []  Georgina Pillion, Pharm.D. []  , [place], Pharm.D.  Positive urine culture, >/= 100,000 colonies -> Proteus Mirabilis Treated with Cephalexin, organism sensitive to the same and no further patient follow-up is required at this time.  09/21/2015, 10:47 AM

## 2015-09-22 ENCOUNTER — Ambulatory Visit (HOSPITAL_COMMUNITY): Payer: Medicare Other | Admitting: Physical Therapy

## 2015-09-22 DIAGNOSIS — S81801D Unspecified open wound, right lower leg, subsequent encounter: Secondary | ICD-10-CM | POA: Diagnosis not present

## 2015-09-22 DIAGNOSIS — S81802D Unspecified open wound, left lower leg, subsequent encounter: Secondary | ICD-10-CM | POA: Diagnosis not present

## 2015-09-22 DIAGNOSIS — I890 Lymphedema, not elsewhere classified: Secondary | ICD-10-CM | POA: Diagnosis not present

## 2015-09-22 DIAGNOSIS — S91109D Unspecified open wound of unspecified toe(s) without damage to nail, subsequent encounter: Secondary | ICD-10-CM

## 2015-09-22 DIAGNOSIS — R262 Difficulty in walking, not elsewhere classified: Secondary | ICD-10-CM | POA: Diagnosis not present

## 2015-09-22 NOTE — Therapy (Signed)
[facility] [place], [place], [address] Phone: [phone]   Fax:  [phone]  Wound Care Therapy  Patient Details  Name: [name] MRN: [id number] Date of Birth: [birth year] No Data Recorded  Encounter Date: 09/22/2015      PT End of Session - 09/22/15 1436    Visit Number 11   Number of Visits 18   Date for PT Re-Evaluation 10/08/15   Authorization Type UHC Medicare and medicaid  (gcode updated visit #8)   Authorization Time Period 08/08/15 to 10/08/15   Authorization - Visit Number 11   Authorization - Number of Visits 18   PT Start Time 1300   PT Stop Time 1413   PT Time Calculation (min) 73 min   Activity Tolerance Patient tolerated treatment well      Past Medical History  Diagnosis Date  . CHF (congestive heart failure) ([place])      preserved left ventricular systolic function  . Hypertension   . Overweight(278.02)   . Hypothyroid   . Vaginal pessary present ~ 2012  . CKD (chronic kidney disease) stage 4, GFR 15-29 ml/min (HCC)     Archie Endo 02/16/2013  . Anemia associated with chronic renal failure     ESA therapy  . History of blood transfusion 1975  . Rheumatoid arthritis ([place])   . Osteoporosis   . Pneumonia 02/2011    Archie Endo 02/20/2011 (02/16/2013)  . Diabetic foot ulcers ([place])     "get them on borh feet" (02/16/2013)  . Shortness of breath dyspnea     with exertion  . Type II diabetes mellitus ([place])     patient denies    Past Surgical History  Procedure Laterality Date  . Cataract extraction w/ intraocular lens  implant, bilateral Bilateral 1990's  . Orif femur fracture Left 1975    "got a rod and screw in it" (02/16/2013)  . Av fistula placement Left 01/26/2015    Procedure: LEFT ARM ARTERIOVENOUS (AV) FISTULA CREATION;  Surgeon: Conrad Frostproof, MD;  Location: [facility];  Service: Vascular;  Laterality: Left;    There were no vitals filed for this visit.  Visit Diagnosis:  Multiple open wounds of  lower leg, right, subsequent encounter  Multiple open wounds of lower leg, left, subsequent encounter  Open toe wound, subsequent encounter  Difficulty walking                 Wound Therapy - 09/22/15 1426    Subjective Pt states that she was extremely ill and had to go to the ER.   States she has a hernia which will need to be operated on soon.  Pt states her legs have been good and she feels that she can walk easier now.  She was suppose to be measured for her stockings on Tuesday but the weather prohibited it from being done.    Patient and Family Stated Goals get all wounds healed up    Prior Treatments self treat   Pain Assessment 0-10   Pain Score 0-No pain   Wound Properties Date First Assessed: 08/17/15 Wound Type: Other (Comment) , R LE posterior medial   Location: Other (Comment) , R shin posterior medial   Location Orientation: Right;Posterior;Medial Wound Description (Comments): R shin posterior medial  Present on Admission: Yes   Dressing Type Impregnated gauze (bismuth)   Dressing Changed Changed   Dressing Status Old drainage   Dressing Change Frequency PRN   % Wound base  Red or Granulating 90%   % Wound base Yellow 10%   Peri-wound Assessment Intact   Wound Length (cm) 3 cm   Wound Width (cm) 2 cm   Wound Depth (cm) 0.3 cm   Drainage Amount Minimal   Drainage Description Serous;No odor   Treatment Cleansed;Debridement (Selective);Other (Comment)  multilayer compression derssing  using profore    Wound Properties Date First Assessed: 08/17/15 Location: Other (Comment) , L antero medial shin   Location Orientation: Left;Anterior;Medial , L antero medial shin   Present on Admission: Yes   Dressing Type Impregnated gauze (bismuth)   Dressing Changed Changed   Dressing Status Old drainage   % Wound base Red or Granulating 90%   % Wound base Yellow 10%   Peri-wound Assessment Intact;Induration   Wound Length (cm) 1 cm   Wound Width (cm) 1 cm   Wound Depth  (cm) 0.2 cm   Drainage Amount Scant   Drainage Description Serous;Odor   Treatment Cleansed;Debridement (Selective);Packing (Impregnated strip);Other (Comment)  multilayer compression bandage system,Profore    Wound Properties Date First Assessed: 08/08/15 Location: Toe (Comment  which one) Location Orientation: Left;Other (Comment) , 2nd toe   Wound Description (Comments): 2nd toe  Present on Admission: Yes   Dressing Type Impregnated gauze (bismuth)   Dressing Status Old drainage   Dressing Change Frequency PRN   Site / Wound Assessment Yellow;Granulation tissue   % Wound base Red or Granulating 70%   % Wound base Yellow 30%   Peri-wound Assessment Intact   Wound Length (cm) 1 cm   Wound Width (cm) 0.3 cm   Wound Depth (cm) 0.1 cm   Closure None   Drainage Amount Minimal   Drainage Description Serous;Odor   Treatment Cleansed   Wound Properties Date First Assessed: 08/08/15 Location: Toe (Comment  which one) Location Orientation: Left Wound Description (Comments): 3rd toe left  Present on Admission: Yes Final Assessment Date: 09/22/15 Final Assessment Time: 1400   Dressing Type --   Dressing Status --   Dressing Change Frequency --   Site / Wound Assessment --   % Wound base Red or Granulating --   % Wound base Yellow --   Margins --   Closure --   Drainage Amount --   Drainage Description --   Selective Debridement - Location All wounds and dry skin perimeter   Selective Debridement - Tools Used Forceps   Selective Debridement - Tissue Removed Slough, dry skin   Wound Therapy - Clinical Statement Continued increase in granulation and decreaseing in size.  Only one wound remains on the medial posterior LE's and on second toes on each side.  continued removal of hyperkeratosis to encourage approximation of wound borders.  Pt tolerated treatment well wtihout c/o pain or discomfort.  Moisturized LE's well prior to redressing with profore system.  Contiued use of xeroform and profore  system.    Wound Therapy - Functional Problem List non-healing wound    Factors Delaying/Impairing Wound Healing Altered sensation;Diabetes Mellitus;Polypharmacy;Vascular compromise   Wound Therapy - Frequency Other (comment)   Wound Therapy - Current Recommendations PT   Wound Plan manual if time allows with cleansing and profore dressing    Dressing  xeroform; or  silver hydrofiber;toe bandages and profore bandage system   Decrease Necrotic Tissue to 30%   Decrease Necrotic Tissue - Progress Met   Increase Granulation Tissue to 70%   Increase Granulation Tissue - Progress Met   Decrease Length/Width/Depth by (cm) at least 60% closure  all wounds    Decrease Length/Width/Depth - Progress Progressing toward goal   Improve Drainage Characteristics --  New STG: 2 weeks: Pt to be I in ex to improve lymph flow   Patient/Family will be able to  self-dress wounds and recognize signs of infection   New: 08/23/15 STG: 2 wk able to identify signs of cellulitis   Patient/Family Instruction Goal - Progress Not met   Additional Wound Therapy Goal full wound closure all wounds   New LTG:4 wks: 30% fluid reduction for improved mobilty   Additional Wound Therapy Goal - Progress Progressing toward goal   Goals/treatment plan/discharge plan were made with and agreed upon by patient/family Yes   Time For Goal Achievement Other (comment)   Wound Therapy - Potential for Goals Good                  Problem List Patient Active Problem List   Diagnosis Date Noted  . Uterovaginal prolapse, complete 03/26/2013  . Diabetic foot ulcers ([place]) 02/16/2013  . Hyperkalemia 06/24/2012  . Hyperlipidemia 05/20/2012  . Uterine prolapse 05/19/2012  . Chronic kidney disease, stage 4, severely decreased GFR (HCC)   . Hypertension   . Anemia associated with chronic renal failure   . Hypothyroid   . CAROTID BRUIT, LEFT 05/11/2009  . DIABETES MELLITUS 03/11/2009  . OVERWEIGHT/OBESITY 03/11/2009     Rayetta Humphrey, PT CLT [phone] 09/22/2015, 2:40 PM  [facility] [address] [place], [place], [address] Phone: [phone]   Fax:  [phone]  Name: [name] MRN: [id number] Date of Birth: 01-27-1932

## 2015-09-27 ENCOUNTER — Ambulatory Visit (HOSPITAL_COMMUNITY): Payer: Medicare Other | Admitting: Physical Therapy

## 2015-09-27 DIAGNOSIS — R262 Difficulty in walking, not elsewhere classified: Secondary | ICD-10-CM | POA: Diagnosis not present

## 2015-09-27 DIAGNOSIS — S81801D Unspecified open wound, right lower leg, subsequent encounter: Secondary | ICD-10-CM

## 2015-09-27 DIAGNOSIS — I890 Lymphedema, not elsewhere classified: Secondary | ICD-10-CM | POA: Diagnosis not present

## 2015-09-27 DIAGNOSIS — S81802D Unspecified open wound, left lower leg, subsequent encounter: Secondary | ICD-10-CM | POA: Diagnosis not present

## 2015-09-27 DIAGNOSIS — S91109D Unspecified open wound of unspecified toe(s) without damage to nail, subsequent encounter: Secondary | ICD-10-CM | POA: Diagnosis not present
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
## 2015-09-28 ENCOUNTER — Encounter (HOSPITAL_COMMUNITY)
Admission: RE | Admit: 2015-09-28 | Discharge: 2015-09-28 | Disposition: A | Discharge status: Home / Self Care | Payer: Medicare Other | Source: Ambulatory Visit | Attending: Nephrology | Admitting: Nephrology

## 2015-09-28 DIAGNOSIS — N183 Chronic kidney disease, stage 3 (moderate): Secondary | ICD-10-CM | POA: Insufficient documentation

## 2015-09-28 DIAGNOSIS — D631 Anemia in chronic kidney disease: Secondary | ICD-10-CM | POA: Diagnosis not present

## 2015-09-28 LAB — POCT HEMOGLOBIN-HEMACUE: Hemoglobin: 9.8 g/dL — ABNORMAL LOW (ref 12.0–15.0)

## 2015-09-28 MED ORDER — EPOETIN ALFA 20000 UNIT/ML IJ SOLN
20000.0000 [IU] | INTRAMUSCULAR | Status: DC
  Administered 2015-09-28: 20000 [IU] via SUBCUTANEOUS

## 2015-09-28 MED ORDER — EPOETIN ALFA 20000 UNIT/ML IJ SOLN
INTRAMUSCULAR | Status: AC
  Administered 2015-09-28: 20000 [IU] via SUBCUTANEOUS
  Filled 2015-09-28: qty 1

## 2015-09-29 ENCOUNTER — Ambulatory Visit (HOSPITAL_COMMUNITY): Payer: Medicare Other | Admitting: Physical Therapy

## 2015-09-29 DIAGNOSIS — S91109D Unspecified open wound of unspecified toe(s) without damage to nail, subsequent encounter: Secondary | ICD-10-CM | POA: Diagnosis not present

## 2015-09-29 DIAGNOSIS — S81801D Unspecified open wound, right lower leg, subsequent encounter: Secondary | ICD-10-CM

## 2015-09-29 DIAGNOSIS — S81802D Unspecified open wound, left lower leg, subsequent encounter: Secondary | ICD-10-CM

## 2015-09-29 DIAGNOSIS — I890 Lymphedema, not elsewhere classified: Secondary | ICD-10-CM | POA: Diagnosis not present

## 2015-09-29 DIAGNOSIS — R262 Difficulty in walking, not elsewhere classified: Secondary | ICD-10-CM | POA: Diagnosis not present

## 2015-09-29 NOTE — Therapy (Signed)
[facility] [address] [place], [place], [address] Phone: [phone]   Fax:  [phone]  Wound Care Therapy  Patient Details  Name: TASHICA PROVENCIO MRN: [id number] Date of Birth: [birth year] No Data Recorded  Encounter Date: 09/29/2015      PT End of Session - 09/29/15 1540    Visit Number 13   Number of Visits 18   Date for PT Re-Evaluation 10/27/15   Authorization Type UHC Medicare and medicaid  (gcode updated visit 8; )   Authorization Time Period 08/08/15 to 10/27/2015   Authorization - Visit Number 13   Authorization - Number of Visits 18   PT Start Time 1305   PT Stop Time 1410   PT Time Calculation (min) 65 min   Activity Tolerance Patient tolerated treatment well   Behavior During Therapy [facility] for tasks assessed/performed      Past Medical History  Diagnosis Date  . CHF (congestive heart failure) (HCC)      preserved left ventricular systolic function  . Hypertension   . Overweight(278.02)   . Hypothyroid   . Vaginal pessary present ~ 2012  . CKD (chronic kidney disease) stage 4, GFR 15-29 ml/min (HCC)     [name] 02/16/2013  . Anemia associated with chronic renal failure     ESA therapy  . History of blood transfusion 1975  . Rheumatoid arthritis (HCC)   . Osteoporosis   . Pneumonia 02/2011    [name] 02/20/2011 (02/16/2013)  . Diabetic foot ulcers (HCC)     "get them on borh feet" (02/16/2013)  . Shortness of breath dyspnea     with exertion  . Type II diabetes mellitus (HCC)     patient denies    Past Surgical History  Procedure Laterality Date  . Cataract extraction w/ intraocular lens  implant, bilateral Bilateral 1990's  . Orif femur fracture Left 1975    "got a rod and screw in it" (02/16/2013)  . Av fistula placement Left 01/26/2015    Procedure: LEFT ARM ARTERIOVENOUS (AV) FISTULA CREATION;  Surgeon: [name], MD;  Location: [facility] OR;  Service: Vascular;  Laterality: Left;    There were no vitals filed  for this visit.  Visit Diagnosis:  Multiple open wounds of lower leg, right, subsequent encounter  Difficulty walking  Lymphedema  Multiple open wounds of lower leg, left, subsequent encounter  Open toe wound, subsequent encounter                 Wound Therapy - 09/29/15 1534    Subjective Pt states that overall her legs are feeling well.  They are beginning to itch.  Pt is to be measured for her compression garments next week.    Patient and Family Stated Goals get all wounds healed up    Prior Treatments self treat   Evaluation and Treatment Procedures Explained to Patient/Family Yes   Evaluation and Treatment Procedures agreed to   Wound Properties Date First Assessed: 08/17/15 Wound Type: Other (Comment) , R LE posterior medial   Location: Other (Comment) , R shin posterior medial   Location Orientation: Right;Posterior;Medial Wound Description (Comments): R shin posterior medial  Present on Admission: Yes   Dressing Type Compression wrap;Impregnated gauze (bismuth)   Dressing Changed Changed   Dressing Status Old drainage   Dressing Change Frequency PRN   % Wound base Red or Granulating 100%   % Wound base Yellow 0%   Peri-wound Assessment Intact   Drainage  Amount Minimal   Drainage Description Serous;No odor   Treatment Cleansed;Debridement (Selective)   Selective Debridement - Location All wounds and dry skin perimeter   Selective Debridement - Tools Used Forceps   Selective Debridement - Tissue Removed Slough, dry skin   Wound Therapy - Clinical Statement PT only with very small wound on posterior Lt LE and medial Rt LE that are 100% granulated.  No induration present today with also improved skin integrity of LE's.  continued removal of dry skin around dorsal toes prior to cleansing, moisturinzing and redressing with xeroform and profore compression.  Both toes also bandaged with toe wraps to further help decrease hyperkeratosis and prevent edema.     Wound  Therapy - Functional Problem List non-healing wound    Factors Delaying/Impairing Wound Healing Altered sensation;Diabetes Mellitus;Polypharmacy;Vascular compromise   Wound Therapy - Frequency Other (comment)   Wound Therapy - Current Recommendations PT   Wound Plan manual if time allows with cleansing and profore dressing    Dressing  xeroform; or  silver hydrofiber;toe bandages and profore bandage system   Manual Therapy --   Decrease Necrotic Tissue to 30%   Increase Granulation Tissue to 70%   Decrease Length/Width/Depth by (cm) at least 60% closure all wounds    Improve Drainage Characteristics --  New STG: 2 weeks: Pt to be I in ex to improve lymph flow   Patient/Family will be able to  self-dress wounds and recognize signs of infection   New: 08/23/15 STG: 2 wk able to identify signs of cellulitis   Additional Wound Therapy Goal full wound closure all wounds   New LTG:4 wks: 30% fluid reduction for improved mobilty   Goals/treatment plan/discharge plan were made with and agreed upon by patient/family Yes   Time For Goal Achievement Other (comment)   Wound Therapy - Potential for Goals Good               Problem List Patient Active Problem List   Diagnosis Date Noted  . Uterovaginal prolapse, complete 03/26/2013  . Diabetic foot ulcers (HCC) 02/16/2013  . Hyperkalemia 06/24/2012  . Hyperlipidemia 05/20/2012  . Uterine prolapse 05/19/2012  . Chronic kidney disease, stage 4, severely decreased GFR (HCC)   . Hypertension   . Anemia associated with chronic renal failure   . Hypothyroid   . CAROTID BRUIT, LEFT 05/11/2009  . DIABETES MELLITUS 03/11/2009  . OVERWEIGHT/OBESITY 03/11/2009    Lurena Nida, PTA/CLT [phone] 09/29/2015, 3:42 PM  [place] [facility] [address] [place], [place], [address] Phone: [phone]   Fax:  [phone]  Name: MELIKA REDER MRN: [id number] Date of Birth: 03/27/1932

## 2015-10-04 ENCOUNTER — Ambulatory Visit (HOSPITAL_COMMUNITY): Payer: Medicare Other | Admitting: Physical Therapy

## 2015-10-04 DIAGNOSIS — S81802D Unspecified open wound, left lower leg, subsequent encounter: Secondary | ICD-10-CM

## 2015-10-04 DIAGNOSIS — I890 Lymphedema, not elsewhere classified: Secondary | ICD-10-CM

## 2015-10-04 DIAGNOSIS — S91109D Unspecified open wound of unspecified toe(s) without damage to nail, subsequent encounter: Secondary | ICD-10-CM

## 2015-10-04 DIAGNOSIS — R262 Difficulty in walking, not elsewhere classified: Secondary | ICD-10-CM

## 2015-10-04 DIAGNOSIS — K429 Umbilical hernia without obstruction or gangrene: Secondary | ICD-10-CM | POA: Diagnosis not present

## 2015-10-04 DIAGNOSIS — S81801D Unspecified open wound, right lower leg, subsequent encounter: Secondary | ICD-10-CM | POA: Diagnosis not present

## 2015-10-04 NOTE — Therapy (Signed)
[place], [place], [address] Phone: [phone]   Fax:  [phone]  Wound Care Therapy  Patient Details  Name: [name] MRN: [id number] Date of Birth: 1931/10/16 No Data Recorded  Encounter Date: 10/04/2015      PT End of Session - 10/04/15 1432    Visit Number 13   Number of Visits 18   Date for PT Re-Evaluation 10/27/15   Authorization Type UHC Medicare and medicaid  (gcode updated visit 8; )  Grand daughter, Pa Tennant is CG  [phone]   Authorization Time Period 08/08/15 to 10/27/2015   Authorization - Visit Number 13   Authorization - Number of Visits 18   PT Start Time 1300   PT Stop Time 1410   PT Time Calculation (min) 70 min   Activity Tolerance Patient tolerated treatment well   Behavior During Therapy [facility] for tasks assessed/performed      Past Medical History  Diagnosis Date  . CHF (congestive heart failure) ([place])      preserved left ventricular systolic function  . Hypertension   . Overweight(278.02)   . Hypothyroid   . Vaginal pessary present ~ 2012  . CKD (chronic kidney disease) stage 4, GFR 15-29 ml/min (HCC)     Archie Endo 02/16/2013  . Anemia associated with chronic renal failure     ESA therapy  . History of blood transfusion 1975  . Rheumatoid arthritis ([place])   . Osteoporosis   . Pneumonia 02/2011    Archie Endo 02/20/2011 (02/16/2013)  . Diabetic foot ulcers ([place])     "get them on borh feet" (02/16/2013)  . Shortness of breath dyspnea     with exertion  . Type II diabetes mellitus ([place])     patient denies    Past Surgical History  Procedure Laterality Date  . Cataract extraction w/ intraocular lens  implant, bilateral Bilateral 1990's  . Orif femur fracture Left 1975    "got a rod and screw in it" (02/16/2013)  . Av fistula placement Left 01/26/2015    Procedure: LEFT ARM ARTERIOVENOUS (AV) FISTULA CREATION;  Surgeon: Conrad [place], MD;  Location: [place];  Service: Vascular;   Laterality: Left;    There were no vitals filed for this visit.  Visit Diagnosis:  Multiple open wounds of lower leg, right, subsequent encounter  Difficulty walking  Lymphedema  Multiple open wounds of lower leg, left, subsequent encounter  Open toe wound, subsequent encounter                 Wound Therapy - 10/04/15 1415    Subjective Pt comes with grand daughter today who states she is trying to help her de-clutter her house.  PT reports her legs feel much better and has not had pain in a while.    Patient and Family Stated Goals get all wounds healed up    Wound Properties Date First Assessed: 08/17/15 Wound Type: Other (Comment) , R LE posterior medial   Location: Other (Comment) , R shin posterior medial   Location Orientation: Right;Posterior;Medial Wound Description (Comments): R shin posterior medial  Present on Admission: Yes   Dressing Type Compression wrap;Impregnated gauze (bismuth)   Dressing Changed Changed   Dressing Status Old drainage   % Wound base Red or Granulating 100%   % Wound base Yellow 0%   Peri-wound Assessment Intact   Wound Length (cm) 1 cm   Wound Width (cm) 1 cm   Wound Depth (  cm) 0 cm   Selective Debridement - Location dry skin on toes, dorsum of foot    Selective Debridement - Tools Used Forceps   Selective Debridement - Tissue Removed Slough, dry skin   Wound Therapy - Clinical Statement PT only with very small wound on posterior Lt LE and medial Rt LE that are 100% granulated.  No induration present today with also improved skin integrity of LE's.  continued removal of dry skin around dorsal toes prior to cleansing, moisturinzing and redressing with xeroform and profore compression.  Both toes also bandaged with toe wraps to further help decrease hyperkeratosis and prevent edema.     Wound Therapy - Functional Problem List non-healing wound    Factors Delaying/Impairing Wound Healing Altered sensation;Diabetes  Mellitus;Polypharmacy;Vascular compromise   Wound Plan follow up with garments.  continue wound care if needed.    Dressing  xeroform, toe bandages and profore bandage system   Decrease Necrotic Tissue to 30%   Decrease Necrotic Tissue - Progress Met   Increase Granulation Tissue to 70%   Increase Granulation Tissue - Progress Met   Decrease Length/Width/Depth by (cm) at least 60% closure all wounds    Decrease Length/Width/Depth - Progress Met   Improve Drainage Characteristics - Progress Met   Patient/Family will be able to  self-dress wounds and recognize signs of infection    Patient/Family Instruction Goal - Progress Not progressing   Additional Wound Therapy Goal full wound closure all wounds    Additional Wound Therapy Goal - Progress Progressing toward goal                              Problem List Patient Active Problem List   Diagnosis Date Noted  . Uterovaginal prolapse, complete 03/26/2013  . Diabetic foot ulcers ([place]) 02/16/2013  . Hyperkalemia 06/24/2012  . Hyperlipidemia 05/20/2012  . Uterine prolapse 05/19/2012  . Chronic kidney disease, stage 4, severely decreased GFR (HCC)   . Hypertension   . Anemia associated with chronic renal failure   . Hypothyroid   . CAROTID BRUIT, LEFT 05/11/2009  . DIABETES MELLITUS 03/11/2009  . OVERWEIGHT/OBESITY 03/11/2009    Teena Irani, PTA/CLT [phone]  10/04/2015, 2:34 PM  [place] [address] [place], [place], [address] Phone: [phone]   Fax:  [phone]  Name: [name] MRN: [id number] Date of Birth: 1931-11-22

## 2015-10-05 ENCOUNTER — Encounter (HOSPITAL_COMMUNITY)
Admission: RE | Admit: 2015-10-05 | Discharge: 2015-10-05 | Disposition: A | Discharge status: Home / Self Care | Payer: Medicare Other | Source: Ambulatory Visit | Attending: Nephrology | Admitting: Nephrology

## 2015-10-05 DIAGNOSIS — D631 Anemia in chronic kidney disease: Secondary | ICD-10-CM | POA: Diagnosis not present

## 2015-10-05 DIAGNOSIS — N183 Chronic kidney disease, stage 3 (moderate): Secondary | ICD-10-CM | POA: Diagnosis not present

## 2015-10-05 LAB — RENAL FUNCTION PANEL
ALBUMIN: 3.3 g/dL — AB (ref 3.5–5.0)
ANION GAP: 13 (ref 5–15)
BUN: 61 mg/dL — ABNORMAL HIGH (ref 6–20)
CALCIUM: 9.2 mg/dL (ref 8.9–10.3)
CO2: 20 mmol/L — ABNORMAL LOW (ref 22–32)
Chloride: 107 mmol/L (ref 101–111)
Creatinine, Ser: 4.59 mg/dL — ABNORMAL HIGH (ref 0.44–1.00)
GFR, EST AFRICAN AMERICAN: 9 mL/min — AB (ref >60)
GFR, EST NON AFRICAN AMERICAN: 8 mL/min — AB (ref >60)
Glucose, Bld: 95 mg/dL (ref 65–99)
PHOSPHORUS: 3.8 mg/dL (ref 2.5–4.6)
POTASSIUM: 4.8 mmol/L (ref 3.5–5.1)
SODIUM: 140 mmol/L (ref 135–145)

## 2015-10-05 LAB — IRON AND TIBC
IRON: 38 ug/dL (ref 28–170)
SATURATION RATIOS: 18 % (ref 10.4–31.8)
TIBC: 207 ug/dL — ABNORMAL LOW (ref 250–450)
UIBC: 169 ug/dL

## 2015-10-05 LAB — POCT HEMOGLOBIN-HEMACUE: Hemoglobin: 9.8 g/dL — ABNORMAL LOW (ref 12.0–15.0)

## 2015-10-05 LAB — FERRITIN: Ferritin: 742 ng/mL — ABNORMAL HIGH (ref 11–307)

## 2015-10-05 MED ORDER — EPOETIN ALFA 20000 UNIT/ML IJ SOLN: 20000.0000 [IU] | INTRAMUSCULAR | Status: DC

## 2015-10-05 MED ORDER — EPOETIN ALFA 20000 UNIT/ML IJ SOLN
INTRAMUSCULAR | Status: AC
  Administered 2015-10-05: 20000 [IU] via SUBCUTANEOUS
  Filled 2015-10-05: qty 1

## 2015-10-06 ENCOUNTER — Ambulatory Visit (HOSPITAL_COMMUNITY): Payer: Medicare Other | Admitting: Physical Therapy

## 2015-10-06 DIAGNOSIS — S81801D Unspecified open wound, right lower leg, subsequent encounter: Secondary | ICD-10-CM

## 2015-10-06 DIAGNOSIS — I890 Lymphedema, not elsewhere classified: Secondary | ICD-10-CM | POA: Diagnosis not present

## 2015-10-06 DIAGNOSIS — R262 Difficulty in walking, not elsewhere classified: Secondary | ICD-10-CM

## 2015-10-06 DIAGNOSIS — S81802D Unspecified open wound, left lower leg, subsequent encounter: Secondary | ICD-10-CM | POA: Diagnosis not present

## 2015-10-06 DIAGNOSIS — S91109D Unspecified open wound of unspecified toe(s) without damage to nail, subsequent encounter: Secondary | ICD-10-CM

## 2015-10-06 NOTE — Therapy (Signed)
[place] [facility] [address] [place], [place], [address] Phone: [phone]   Fax:  [phone]  Wound Care Therapy  Patient Details  Name: [name] MRN: [id number] Date of Birth: [birth year] No Data Recorded  Encounter Date: 10/06/2015      PT End of Session - 10/06/15 1434    Visit Number 14   Number of Visits 18   Date for PT Re-Evaluation 10/27/15   Authorization Type UHC Medicare and medicaid  (gcode updated visit 8; )  Grand daughter, [name] is CG  [phone]   Authorization Time Period 08/08/15 to 10/27/2015   Authorization - Visit Number 14   Authorization - Number of Visits 18   PT Start Time 1300   PT Stop Time 1350   PT Time Calculation (min) 50 min   Activity Tolerance Patient tolerated treatment well   Behavior During Therapy [facility] for tasks assessed/performed      Past Medical History  Diagnosis Date  . CHF (congestive heart failure) (HCC)      preserved left ventricular systolic function  . Hypertension   . Overweight(278.02)   . Hypothyroid   . Vaginal pessary present ~ 2012  . CKD (chronic kidney disease) stage 4, GFR 15-29 ml/min (HCC)     [name] 02/16/2013  . Anemia associated with chronic renal failure     ESA therapy  . History of blood transfusion 1975  . Rheumatoid arthritis (HCC)   . Osteoporosis   . Pneumonia 02/2011    [name] 02/20/2011 (02/16/2013)  . Diabetic foot ulcers (HCC)     "get them on borh feet" (02/16/2013)  . Shortness of breath dyspnea     with exertion  . Type II diabetes mellitus (HCC)     patient denies    Past Surgical History  Procedure Laterality Date  . Cataract extraction w/ intraocular lens  implant, bilateral Bilateral 1990's  . Orif femur fracture Left 1975    "got a rod and screw in it" (02/16/2013)  . Av fistula placement Left 01/26/2015    Procedure: LEFT ARM ARTERIOVENOUS (AV) FISTULA CREATION;  Surgeon: [name], MD;  Location: [facility] OR;  Service: Vascular;   Laterality: Left;    There were no vitals filed for this visit.  Visit Diagnosis:  Multiple open wounds of lower leg, right, subsequent encounter  Difficulty walking  Lymphedema  Multiple open wounds of lower leg, left, subsequent encounter  Open toe wound, subsequent encounter                 Wound Therapy - 10/06/15 1425    Subjective Pt comes with grand daughter today who states she is trying to help her de-clutter her house.  PT reports her legs feel much better and has not had pain in a while.    Patient and Family Stated Goals get all wounds healed up    Wound Properties Date First Assessed: 08/17/15 Wound Type: Other (Comment) , R LE posterior medial   Location: Other (Comment) , R shin posterior medial   Location Orientation: Right;Posterior;Medial Wound Description (Comments): R shin posterior medial  Present on Admission: Yes   Dressing Type Compression wrap;Impregnated gauze (bismuth)   Dressing Changed Changed   Dressing Status Old drainage   % Wound base Red or Granulating 100%   % Wound base Yellow 0%   Peri-wound Assessment Intact   Wound Length (cm) 0.5 cm   Wound Width (cm) 0.5 cm   Wound Depth (  cm) 0 cm   Drainage Amount Scant   Drainage Description Serous   Treatment Cleansed;Debridement (Selective)   Selective Debridement - Location dry skin on toes, dorsum of foot    Selective Debridement - Tools Used Forceps   Selective Debridement - Tissue Removed Slough, dry skin   Wound Therapy - Clinical Statement Wound on Rt posterior LE continues to decrease in size and anticipate complete closure next session.  Contacted grand daughter to make her aware and to go ahead to schedule day to take her to get her garments (pt is not going to [facility] and is getting OTC instead).  Lt posterior wound and all wounds on toes are now healed.  Debrided remaininng dry skin and slough from Rt posterior LE wound and doesum of toes bilaterally.  continued with profore  and toe bandages.     Wound Therapy - Functional Problem List non-healing wound    Factors Delaying/Impairing Wound Healing Altered sensation;Diabetes Mellitus;Polypharmacy;Vascular compromise   Wound Plan Anticipate discharge next week.  follow up with garments.   Dressing  xeroform, toe bandages and profore bandage system   Decrease Necrotic Tissue to 30%   Increase Granulation Tissue to 70%   Decrease Length/Width/Depth by (cm) at least 60% closure all wounds    Patient/Family will be able to  self-dress wounds and recognize signs of infection    Additional Wound Therapy Goal full wound closure all wounds                               Problem List Patient Active Problem List   Diagnosis Date Noted  . Uterovaginal prolapse, complete 03/26/2013  . Diabetic foot ulcers (HCC) 02/16/2013  . Hyperkalemia 06/24/2012  . Hyperlipidemia 05/20/2012  . Uterine prolapse 05/19/2012  . Chronic kidney disease, stage 4, severely decreased GFR (HCC)   . Hypertension   . Anemia associated with chronic renal failure   . Hypothyroid   . CAROTID BRUIT, LEFT 05/11/2009  . DIABETES MELLITUS 03/11/2009  . OVERWEIGHT/OBESITY 03/11/2009    Lurena Nida, PTA/CLT [phone]  10/06/2015, 2:36 PM  [place] [facility] [address] [place], [place], [address] Phone: [phone]   Fax:  [phone]  Name: [name] MRN: [id number] Date of Birth: 09/07/1932

## 2015-10-11 ENCOUNTER — Ambulatory Visit (HOSPITAL_COMMUNITY): Payer: Medicare Other | Admitting: Physical Therapy

## 2015-10-11 DIAGNOSIS — S81802D Unspecified open wound, left lower leg, subsequent encounter: Secondary | ICD-10-CM

## 2015-10-11 DIAGNOSIS — R262 Difficulty in walking, not elsewhere classified: Secondary | ICD-10-CM | POA: Diagnosis not present

## 2015-10-11 DIAGNOSIS — S81801D Unspecified open wound, right lower leg, subsequent encounter: Secondary | ICD-10-CM

## 2015-10-11 DIAGNOSIS — S91109D Unspecified open wound of unspecified toe(s) without damage to nail, subsequent encounter: Secondary | ICD-10-CM | POA: Diagnosis not present

## 2015-10-11 DIAGNOSIS — I890 Lymphedema, not elsewhere classified: Secondary | ICD-10-CM

## 2015-10-11 NOTE — Therapy (Signed)
[place], [place], [address] Phone: [phone]   Fax:  [phone]  Wound Care Therapy  Patient Details  Name: [name] MRN: [id number] Date of Birth: [birth year] No Data Recorded  Encounter Date: 10/11/2015      PT End of Session - 10/11/15 1424    Visit Number 15   Number of Visits 18   Date for PT Re-Evaluation 10/27/15   Authorization Type UHC Medicare and medicaid  (gcode updated visit 8; )  Grand daughter, Casaundra Takacs is CG  [phone]   Authorization Time Period 08/08/15 to 10/27/2015   Authorization - Visit Number 15   Authorization - Number of Visits 18   PT Start Time 1300   PT Stop Time 1355   PT Time Calculation (min) 55 min   Activity Tolerance Patient tolerated treatment well   Behavior During Therapy [facility] for tasks assessed/performed      Past Medical History  Diagnosis Date  . CHF (congestive heart failure) ([place])      preserved left ventricular systolic function  . Hypertension   . Overweight(278.02)   . Hypothyroid   . Vaginal pessary present ~ 2012  . CKD (chronic kidney disease) stage 4, GFR 15-29 ml/min (HCC)     Archie Endo 02/16/2013  . Anemia associated with chronic renal failure     ESA therapy  . History of blood transfusion 1975  . Rheumatoid arthritis ([place])   . Osteoporosis   . Pneumonia 02/2011    Archie Endo 02/20/2011 (02/16/2013)  . Diabetic foot ulcers ([place])     "get them on borh feet" (02/16/2013)  . Shortness of breath dyspnea     with exertion  . Type II diabetes mellitus ([place])     patient denies    Past Surgical History  Procedure Laterality Date  . Cataract extraction w/ intraocular lens  implant, bilateral Bilateral 1990's  . Orif femur fracture Left 1975    "got a rod and screw in it" (02/16/2013)  . Av fistula placement Left 01/26/2015    Procedure: LEFT ARM ARTERIOVENOUS (AV) FISTULA CREATION;  Surgeon: Conrad [place], MD;  Location: [place];  Service: Vascular;   Laterality: Left;    There were no vitals filed for this visit.  Visit Diagnosis:  Multiple open wounds of lower leg, right, subsequent encounter  Difficulty walking  Lymphedema  Multiple open wounds of lower leg, left, subsequent encounter  Open toe wound, subsequent encounter                 Wound Therapy - 10/11/15 1417    Subjective Pt without complaints.  States she's going to get her stockings thursday if her wounds are healed today.   Patient and Family Stated Goals get all wounds healed up    Wound Properties Date First Assessed: 08/17/15 Wound Type: Other (Comment) , R LE posterior medial   Location: Other (Comment) , R shin posterior medial   Location Orientation: Right;Posterior;Medial Wound Description (Comments): R shin posterior medial  Present on Admission: Yes   Dressing Type Compression wrap   Dressing Changed Changed   Dressing Status Clean;Dry   % Wound base Red or Granulating 100%   % Wound base Yellow 0%   Peri-wound Assessment Intact   Drainage Amount None   Treatment Cleansed   Wound Therapy - Clinical Statement Wounds are now healed with overall improved skin integrity and reduction of edema.  since first measurements approx 1 month ago,  patient has lost 1,134.34cc from [place] and 1,646.56cc from [place].  Lt LE is now only 122.4cc larger than Rt.  Pt has responded well to treatment.  Informed CG that she is now ready for compression garments and discharged from therapy.  Redressed with profore lite to insure swelling remains contained before getting garments.  Instructed patient/CG to remove them at hat time.     Wound Plan discharge as wounds healed and no longer needs skilled intervention.   Dressing  Profore lite   Decrease Necrotic Tissue to 30%   Decrease Necrotic Tissue - Progress Met   Increase Granulation Tissue to 70%   Increase Granulation Tissue - Progress Met   Decrease Length/Width/Depth by (cm) at least 60% closure all wounds     Decrease Length/Width/Depth - Progress Met   Improve Drainage Characteristics - Progress Met   Patient/Family will be able to  self-dress wounds and recognize signs of infection    Patient/Family Instruction Goal - Progress Met   Additional Wound Therapy Goal full wound closure all wounds    Additional Wound Therapy Goal - Progress Met          Date 08/23/2015  09/27/2015  08-Nov-2015    Rt Lt Rt  Lt  Rt  Lt  MTP 22.50 24.00 21.8 21.8 21.8 21.8  ankle 27.2 31.5 24.50 26.00 24.00 25.00  4cm 24.80 25.00 23.50 24.20 23.40 24.00  8cm 28.80 29.80 24.80 25.10 24.50 24.80  12 cm 32.30 34.00 31.00 30.50 30.00 30.00  16cm 36.50 42.50 33.70 33.80 33.50 33.50  20cm 43.50 45.70 34.20 35.80 34.20 34.50  24cm 46.00 48.50 33.70 37.20 34.00 35.00  28cm 45.80 49.60 37.00 42.00 36.20 36.50  32cm 43.00 45.50 41.10 44.00 40.00 42.20                          Sum of squares 13021.00 15014.69 9703.01 10805.06 9457.38 9841.87  Total Volume 9509.3267 4779.326 1245.8099 3439.3586 3010.379 3132.766                                  G-Codes - 08-Nov-2015 1449    Functional Assessment Tool Used Based on wound healing status, presence of slough, general wound healing status, wound size, number of wounds   Functional Limitation Other PT primary   Other PT Primary Goal Status (I3382) At least 20 percent but less than 40 percent impaired, limited or restricted   Other PT Primary Discharge Status (N0539) At least 1 percent but less than 20 percent impaired, limited or restricted       Problem List Patient Active Problem List   Diagnosis Date Noted  . Uterovaginal prolapse, complete 03/26/2013  . Diabetic foot ulcers ([place]) 02/16/2013  . Hyperkalemia 06/24/2012  . Hyperlipidemia 05/20/2012  . Uterine prolapse 05/19/2012  . Chronic kidney disease, stage 4, severely decreased GFR (HCC)   . Hypertension   . Anemia  associated with chronic renal failure   . Hypothyroid   . CAROTID BRUIT, LEFT 05/11/2009  . DIABETES MELLITUS 03/11/2009  . OVERWEIGHT/OBESITY 03/11/2009    Teena Irani, PTA/CLT [phone]  08-Nov-2015, 2:51 PM  [place] [address] [place], [place], [address] Phone: [phone]   Fax:  [phone]  Name: [name] MRN: [id number] Date of Birth: [birth year]    PHYSICAL THERAPY DISCHARGE SUMMARY  Visits from Start of Care: 15  Current functional level related to goals / functional outcomes: All goals met   Remaining deficits: none   Education / Equipment: Need to use compression garment.   Plan: Patient agrees to discharge.  Patient goals were met. Patient is being discharged due to meeting the stated rehab goals.  ?????       Rayetta Humphrey, [place] CLT [phone]

## 2015-10-12 ENCOUNTER — Encounter (HOSPITAL_COMMUNITY): Payer: Medicare Other

## 2015-10-12 DIAGNOSIS — E11319 Type 2 diabetes mellitus with unspecified diabetic retinopathy without macular edema: Secondary | ICD-10-CM | POA: Diagnosis not present

## 2015-10-12 DIAGNOSIS — E1140 Type 2 diabetes mellitus with diabetic neuropathy, unspecified: Secondary | ICD-10-CM | POA: Diagnosis not present

## 2015-10-12 DIAGNOSIS — E113293 Type 2 diabetes mellitus with mild nonproliferative diabetic retinopathy without macular edema, bilateral: Secondary | ICD-10-CM | POA: Diagnosis not present

## 2015-10-12 DIAGNOSIS — Z961 Presence of intraocular lens: Secondary | ICD-10-CM | POA: Diagnosis not present

## 2015-10-12 DIAGNOSIS — Z9849 Cataract extraction status, unspecified eye: Secondary | ICD-10-CM | POA: Diagnosis not present

## 2015-10-13 ENCOUNTER — Encounter (HOSPITAL_COMMUNITY)
Admission: RE | Admit: 2015-10-13 | Discharge: 2015-10-13 | Disposition: A | Discharge status: Home / Self Care | Payer: Medicare Other | Source: Ambulatory Visit | Attending: Nephrology | Admitting: Nephrology

## 2015-10-13 DIAGNOSIS — D631 Anemia in chronic kidney disease: Secondary | ICD-10-CM | POA: Insufficient documentation

## 2015-10-13 DIAGNOSIS — N183 Chronic kidney disease, stage 3 (moderate): Secondary | ICD-10-CM | POA: Insufficient documentation

## 2015-10-13 LAB — POCT HEMOGLOBIN-HEMACUE: Hemoglobin: 10.1 g/dL — ABNORMAL LOW (ref 12.0–15.0)

## 2015-10-13 MED ORDER — EPOETIN ALFA 20000 UNIT/ML IJ SOLN
20000.0000 [IU] | INTRAMUSCULAR | Status: DC
  Administered 2015-10-13: 20000 [IU] via SUBCUTANEOUS

## 2015-10-13 MED ORDER — EPOETIN ALFA 20000 UNIT/ML IJ SOLN
INTRAMUSCULAR | Status: AC
  Filled 2015-10-13: qty 1

## 2015-10-17 ENCOUNTER — Telehealth (HOSPITAL_COMMUNITY): Payer: Self-pay

## 2015-10-17 NOTE — Telephone Encounter (Signed)
10/17/15 lady came in and had Ms. Depinto in the car, she went to [facility] to get supplies and they noticed that her wounds were leaking.  I told the lady that brought her she was discharged and she would need to call or check with her doctor first that we needed a new referral.

## 2015-10-19 ENCOUNTER — Other Ambulatory Visit (HOSPITAL_COMMUNITY): Payer: Self-pay

## 2015-10-20 ENCOUNTER — Ambulatory Visit (HOSPITAL_COMMUNITY)
Admission: RE | Admit: 2015-10-20 | Discharge: 2015-10-20 | Disposition: A | Discharge status: Home / Self Care | Payer: Medicare Other | Source: Ambulatory Visit | Attending: Nephrology | Admitting: Nephrology

## 2015-10-20 DIAGNOSIS — Z79899 Other long term (current) drug therapy: Secondary | ICD-10-CM | POA: Diagnosis not present

## 2015-10-20 DIAGNOSIS — N183 Chronic kidney disease, stage 3 (moderate): Secondary | ICD-10-CM | POA: Insufficient documentation

## 2015-10-20 DIAGNOSIS — D631 Anemia in chronic kidney disease: Secondary | ICD-10-CM | POA: Insufficient documentation

## 2015-10-20 DIAGNOSIS — Z5181 Encounter for therapeutic drug level monitoring: Secondary | ICD-10-CM | POA: Insufficient documentation

## 2015-10-20 DIAGNOSIS — D509 Iron deficiency anemia, unspecified: Secondary | ICD-10-CM | POA: Diagnosis not present

## 2015-10-20 LAB — POCT HEMOGLOBIN-HEMACUE: HEMOGLOBIN: 10.1 g/dL — AB (ref 12.0–15.0)

## 2015-10-20 MED ORDER — EPOETIN ALFA 20000 UNIT/ML IJ SOLN
20000.0000 [IU] | INTRAMUSCULAR | Status: DC
  Administered 2015-10-20: 20000 [IU] via SUBCUTANEOUS

## 2015-10-20 MED ORDER — EPOETIN ALFA 20000 UNIT/ML IJ SOLN
INTRAMUSCULAR | Status: AC
  Filled 2015-10-20: qty 1

## 2015-10-20 MED ORDER — SODIUM CHLORIDE 0.9 % IV SOLN
510.0000 mg | INTRAVENOUS | Status: DC
  Administered 2015-10-20: 510 mg via INTRAVENOUS
  Filled 2015-10-20: qty 17

## 2015-10-21 DIAGNOSIS — R739 Hyperglycemia, unspecified: Secondary | ICD-10-CM | POA: Diagnosis not present

## 2015-10-21 DIAGNOSIS — Z1389 Encounter for screening for other disorder: Secondary | ICD-10-CM | POA: Diagnosis not present

## 2015-10-21 DIAGNOSIS — I10 Essential (primary) hypertension: Secondary | ICD-10-CM | POA: Diagnosis not present

## 2015-10-21 DIAGNOSIS — L97901 Non-pressure chronic ulcer of unspecified part of unspecified lower leg limited to breakdown of skin: Secondary | ICD-10-CM | POA: Diagnosis not present

## 2015-10-21 DIAGNOSIS — I130 Hypertensive heart and chronic kidney disease with heart failure and stage 1 through stage 4 chronic kidney disease, or unspecified chronic kidney disease: Secondary | ICD-10-CM | POA: Diagnosis not present

## 2015-10-24 DIAGNOSIS — D631 Anemia in chronic kidney disease: Secondary | ICD-10-CM | POA: Diagnosis not present

## 2015-10-24 DIAGNOSIS — R809 Proteinuria, unspecified: Secondary | ICD-10-CM | POA: Diagnosis not present

## 2015-10-24 DIAGNOSIS — E1129 Type 2 diabetes mellitus with other diabetic kidney complication: Secondary | ICD-10-CM | POA: Diagnosis not present

## 2015-10-24 DIAGNOSIS — N2581 Secondary hyperparathyroidism of renal origin: Secondary | ICD-10-CM | POA: Diagnosis not present

## 2015-10-24 DIAGNOSIS — N184 Chronic kidney disease, stage 4 (severe): Secondary | ICD-10-CM | POA: Diagnosis not present

## 2015-10-24 DIAGNOSIS — N189 Chronic kidney disease, unspecified: Secondary | ICD-10-CM | POA: Diagnosis not present

## 2015-10-27 ENCOUNTER — Ambulatory Visit (HOSPITAL_COMMUNITY)
Admission: RE | Admit: 2015-10-27 | Discharge: 2015-10-27 | Disposition: A | Discharge status: Home / Self Care | Payer: Medicare Other | Source: Ambulatory Visit | Attending: Nephrology | Admitting: Nephrology

## 2015-10-27 DIAGNOSIS — D509 Iron deficiency anemia, unspecified: Secondary | ICD-10-CM | POA: Diagnosis not present

## 2015-10-27 DIAGNOSIS — N183 Chronic kidney disease, stage 3 (moderate): Secondary | ICD-10-CM | POA: Insufficient documentation

## 2015-10-27 DIAGNOSIS — Z5181 Encounter for therapeutic drug level monitoring: Secondary | ICD-10-CM | POA: Insufficient documentation

## 2015-10-27 DIAGNOSIS — Z79899 Other long term (current) drug therapy: Secondary | ICD-10-CM | POA: Insufficient documentation

## 2015-10-27 DIAGNOSIS — D631 Anemia in chronic kidney disease: Secondary | ICD-10-CM | POA: Insufficient documentation

## 2015-10-27 LAB — POCT HEMOGLOBIN-HEMACUE: HEMOGLOBIN: 10.5 g/dL — AB (ref 12.0–15.0)

## 2015-10-27 MED ORDER — EPOETIN ALFA 20000 UNIT/ML IJ SOLN
20000.0000 [IU] | INTRAMUSCULAR | Status: DC
  Administered 2015-10-27: 20000 [IU] via SUBCUTANEOUS

## 2015-10-27 MED ORDER — EPOETIN ALFA 20000 UNIT/ML IJ SOLN
INTRAMUSCULAR | Status: AC
  Administered 2015-10-27: 20000 [IU] via SUBCUTANEOUS
  Filled 2015-10-27: qty 1

## 2015-10-27 MED ORDER — SODIUM CHLORIDE 0.9 % IV SOLN
510.0000 mg | INTRAVENOUS | Status: AC
  Administered 2015-10-27: 510 mg via INTRAVENOUS
  Filled 2015-10-27: qty 17

## 2015-11-03 ENCOUNTER — Inpatient Hospital Stay (HOSPITAL_COMMUNITY)
Admission: RE | Admit: 2015-11-03 | Discharge: 2015-11-03 | Disposition: A | Discharge status: Home / Self Care | Payer: Medicare Other | Source: Ambulatory Visit | Attending: Nephrology | Admitting: Nephrology

## 2015-11-03 DIAGNOSIS — Z23 Encounter for immunization: Secondary | ICD-10-CM | POA: Diagnosis not present

## 2015-11-03 DIAGNOSIS — E1129 Type 2 diabetes mellitus with other diabetic kidney complication: Secondary | ICD-10-CM | POA: Diagnosis not present

## 2015-11-03 DIAGNOSIS — D689 Coagulation defect, unspecified: Secondary | ICD-10-CM | POA: Diagnosis not present

## 2015-11-03 DIAGNOSIS — N186 End stage renal disease: Secondary | ICD-10-CM | POA: Diagnosis not present

## 2015-11-04 DIAGNOSIS — N186 End stage renal disease: Secondary | ICD-10-CM | POA: Diagnosis not present

## 2015-11-04 DIAGNOSIS — D689 Coagulation defect, unspecified: Secondary | ICD-10-CM | POA: Diagnosis not present

## 2015-11-04 DIAGNOSIS — Z23 Encounter for immunization: Secondary | ICD-10-CM | POA: Diagnosis not present

## 2015-11-04 DIAGNOSIS — E1129 Type 2 diabetes mellitus with other diabetic kidney complication: Secondary | ICD-10-CM | POA: Diagnosis not present

## 2015-11-07 ENCOUNTER — Ambulatory Visit (HOSPITAL_COMMUNITY): Payer: Medicare Other | Attending: Family Medicine | Admitting: Physical Therapy

## 2015-11-07 DIAGNOSIS — S91109D Unspecified open wound of unspecified toe(s) without damage to nail, subsequent encounter: Secondary | ICD-10-CM | POA: Insufficient documentation

## 2015-11-07 DIAGNOSIS — R262 Difficulty in walking, not elsewhere classified: Secondary | ICD-10-CM | POA: Diagnosis not present

## 2015-11-07 NOTE — Therapy (Signed)
[facility] [address] [place], [place], [address] Phone: [phone]   Fax:  [phone]  Wound Care Evaluation  Patient Details  Name: [name] MRN: [id number] Date of Birth: [birth year] No Data Recorded  Encounter Date: 11/07/2015      PT End of Session - 11/07/15 1048    Visit Number 1   Number of Visits 12   Date for PT Re-Evaluation 12/05/15   Authorization Type U[employer] Medicare and medicaid;   Grand daughter, Meosha Castanon is CG  [phone]   Authorization Time Period 11/07/15 to 12/19/15    Authorization - Visit Number 1   Authorization - Number of Visits 10   PT Start Time 0930   PT Stop Time 1017   PT Time Calculation (min) 47 min   Activity Tolerance Patient tolerated treatment well   Behavior During Therapy [facility] for tasks assessed/performed      Past Medical History  Diagnosis Date  . CHF (congestive heart failure) (HCC)      preserved left ventricular systolic function  . Hypertension   . Overweight(278.02)   . Hypothyroid   . Vaginal pessary present ~ 2012  . CKD (chronic kidney disease) stage 4, GFR 15-29 ml/min (HCC)     [name] 02/16/2013  . Anemia associated with chronic renal failure     ESA therapy  . History of blood transfusion 1975  . Rheumatoid arthritis (HCC)   . Osteoporosis   . Pneumonia 02/2011    [name] 02/20/2011 (02/16/2013)  . Diabetic foot ulcers (HCC)     "get them on borh feet" (02/16/2013)  . Shortness of breath dyspnea     with exertion  . Type II diabetes mellitus (HCC)     patient denies    Past Surgical History  Procedure Laterality Date  . Cataract extraction w/ intraocular lens  implant, bilateral Bilateral 1990's  . Orif femur fracture Left 1975    "got a rod and screw in it" (02/16/2013)  . Av fistula placement Left 01/26/2015    Procedure: LEFT ARM ARTERIOVENOUS (AV) FISTULA CREATION;  Surgeon: [name], MD;  Location: [facility] OR;  Service: Vascular;  Laterality: Left;     There were no vitals filed for this visit.  Visit Diagnosis:  Open toe wound, subsequent encounter - Plan: PT plan of care cert/re-cert  Difficulty walking - Plan: PT plan of care cert/re-cert         Wound Therapy - 11/07/15 1038    Subjective Patient returns to skilled PT services with specific order of L toe wound; patient reports she has been extremely busy recently and dialysis, which she showed hada  significant bruise at port site. Patient reports no changes to her personal life.    Patient and Family Stated Goals get all wounds healed up    Pain Assessment No/denies pain   Pain Score 0-No pain   Evaluation and Treatment Procedures Explained to Patient/Family Yes   Evaluation and Treatment Procedures agreed to   Wound Properties Date First Assessed: 08/08/15 Location: Toe (Comment  which one) Location Orientation: Left;Other (Comment) , 2nd toe   Wound Description (Comments): 2nd toe  Present on Admission: Yes Final Assessment Date: 09/27/15 Final Assessment Time: 1428   Dressing Type None   Dressing Changed New   Dressing Status None   Dressing Change Frequency PRN   % Wound base Red or Granulating 50%   % Wound base Yellow 50%   Peri-wound Assessment Edema;Other (Comment)  dryness, small circular structures on skin    Wound Length (cm) 0.8 cm   Wound Width (cm) 0.7 cm   Wound Depth (cm) 0.1 cm  less than    Tunneling (cm) 0   Undermining (cm) 0   Margins Unattached edges (unapproximated)   Drainage Amount None   Drainage Description No odor   Treatment Cleansed;Debridement (Selective);Packing (Impregnated strip);Tape changed;Other (Comment)  debridement, medihoney on guaze, paper tape    Wound Properties Date First Assessed: 08/17/15 Wound Type: Other (Comment) , R LE posterior medial   Location: Other (Comment) , R shin posterior medial   Location Orientation: Right;Posterior;Medial Wound Description (Comments): R shin posterior medial  Present on Admission: Yes    Selective Debridement - Location 2nd L toe wound    Selective Debridement - Tools Used Forceps;Scalpel   Selective Debridement - Tissue Removed slough    Wound Therapy - Clinical Statement Patient returns to skilled PT services regarding a wound on her L toe that had opened back up; examined wound and dresseed with medihoney and gauze. Also noted small raw sports higher up on L LE and dressed with simple xeroform/gauze, no skilled treatment applied to these areas. Patient did request that PT take a look at R LE, and did note raw open area on back of R LE however MD order specific for L toe so asked front desk to fax treatment reqeust for R LE as well. Recommend skilled PT servies to faclitate wound healing and prevention of wound infection at this time.    Wound Therapy - Functional Problem List non-healing wound    Factors Delaying/Impairing Wound Healing Altered sensation;Diabetes Mellitus;Polypharmacy;Immobility   Wound Therapy - Current Recommendations PT   Decrease Necrotic Tissue to 0%   Decrease Necrotic Tissue - Progress Goal set today   Increase Granulation Tissue to 100%   Increase Granulation Tissue - Progress Goal set today   Decrease Length/Width/Depth by (cm) at least 70% closure all wounds    Decrease Length/Width/Depth - Progress Goal set today   Patient/Family will be able to  self-dress wounds and recognize signs of infection    Patient/Family Instruction Goal - Progress Goal set today                         PT Education - 16-Nov-2015 1046    Education provided Yes   Education Details prognosis, plan of care, specific order for R LE to be filed with MD    Person(s) Educated Patient   Methods Explanation   Comprehension Verbalized understanding                     G-Codes - 11/16/2015 1050    Functional Assessment Tool Used Based on wound healing status, presence of slough, general wound healing status, wound size, number of wounds    Functional Limitation Other PT primary   Other PT Primary Current Status (S3419) At least 40 percent but less than 60 percent impaired, limited or restricted   Other PT Primary Goal Status (Q2229) At least 20 percent but less than 40 percent impaired, limited or restricted      Problem List Patient Active Problem List   Diagnosis Date Noted  . Uterovaginal prolapse, complete 03/26/2013  . Diabetic foot ulcers (HCC) 02/16/2013  . Hyperkalemia 06/24/2012  . Hyperlipidemia 05/20/2012  . Uterine prolapse 05/19/2012  . Chronic kidney disease, stage 4, severely decreased GFR (HCC)   . Hypertension   . Anemia  associated with chronic renal failure   . Hypothyroid   . CAROTID BRUIT, LEFT 05/11/2009  . DIABETES MELLITUS 03/11/2009  . OVERWEIGHT/OBESITY 03/11/2009    Nedra Hai PT, DPT [phone]  [facility] [address] [place], [place], [address] Phone: [phone]   Fax:  [phone]  Name: [name] MRN: [id number] Date of Birth: [birth year]

## 2015-11-08 ENCOUNTER — Encounter (HOSPITAL_COMMUNITY): Payer: Self-pay | Admitting: Emergency Medicine

## 2015-11-08 ENCOUNTER — Emergency Department (HOSPITAL_COMMUNITY)
Admission: EM | Admit: 2015-11-08 | Discharge: 2015-11-08 | Disposition: A | Discharge status: Home / Self Care | Payer: Medicare Other | Attending: Emergency Medicine | Admitting: Emergency Medicine

## 2015-11-08 DIAGNOSIS — Z8742 Personal history of other diseases of the female genital tract: Secondary | ICD-10-CM | POA: Insufficient documentation

## 2015-11-08 DIAGNOSIS — N184 Chronic kidney disease, stage 4 (severe): Secondary | ICD-10-CM | POA: Insufficient documentation

## 2015-11-08 DIAGNOSIS — Z792 Long term (current) use of antibiotics: Secondary | ICD-10-CM | POA: Insufficient documentation

## 2015-11-08 DIAGNOSIS — Z23 Encounter for immunization: Secondary | ICD-10-CM | POA: Diagnosis not present

## 2015-11-08 DIAGNOSIS — L97509 Non-pressure chronic ulcer of other part of unspecified foot with unspecified severity: Secondary | ICD-10-CM | POA: Insufficient documentation

## 2015-11-08 DIAGNOSIS — Y658 Other specified misadventures during surgical and medical care: Secondary | ICD-10-CM | POA: Insufficient documentation

## 2015-11-08 DIAGNOSIS — N186 End stage renal disease: Secondary | ICD-10-CM | POA: Diagnosis not present

## 2015-11-08 DIAGNOSIS — T83198A Other mechanical complication of other urinary devices and implants, initial encounter: Secondary | ICD-10-CM | POA: Diagnosis not present

## 2015-11-08 DIAGNOSIS — M7989 Other specified soft tissue disorders: Secondary | ICD-10-CM | POA: Diagnosis not present

## 2015-11-08 DIAGNOSIS — I509 Heart failure, unspecified: Secondary | ICD-10-CM | POA: Diagnosis not present

## 2015-11-08 DIAGNOSIS — Z79899 Other long term (current) drug therapy: Secondary | ICD-10-CM | POA: Insufficient documentation

## 2015-11-08 DIAGNOSIS — E11621 Type 2 diabetes mellitus with foot ulcer: Secondary | ICD-10-CM | POA: Diagnosis not present

## 2015-11-08 DIAGNOSIS — M069 Rheumatoid arthritis, unspecified: Secondary | ICD-10-CM | POA: Insufficient documentation

## 2015-11-08 DIAGNOSIS — Z8701 Personal history of pneumonia (recurrent): Secondary | ICD-10-CM | POA: Insufficient documentation

## 2015-11-08 DIAGNOSIS — T82838A Hemorrhage of vascular prosthetic devices, implants and grafts, initial encounter: Secondary | ICD-10-CM | POA: Insufficient documentation

## 2015-11-08 DIAGNOSIS — Z992 Dependence on renal dialysis: Secondary | ICD-10-CM | POA: Diagnosis not present

## 2015-11-08 DIAGNOSIS — E663 Overweight: Secondary | ICD-10-CM | POA: Insufficient documentation

## 2015-11-08 DIAGNOSIS — T82858A Stenosis of vascular prosthetic devices, implants and grafts, initial encounter: Secondary | ICD-10-CM | POA: Diagnosis not present

## 2015-11-08 DIAGNOSIS — D631 Anemia in chronic kidney disease: Secondary | ICD-10-CM | POA: Diagnosis not present

## 2015-11-08 DIAGNOSIS — E1122 Type 2 diabetes mellitus with diabetic chronic kidney disease: Secondary | ICD-10-CM | POA: Diagnosis not present

## 2015-11-08 DIAGNOSIS — E1129 Type 2 diabetes mellitus with other diabetic kidney complication: Secondary | ICD-10-CM | POA: Diagnosis not present

## 2015-11-08 DIAGNOSIS — E039 Hypothyroidism, unspecified: Secondary | ICD-10-CM | POA: Diagnosis not present

## 2015-11-08 DIAGNOSIS — I871 Compression of vein: Secondary | ICD-10-CM | POA: Diagnosis not present

## 2015-11-08 DIAGNOSIS — D689 Coagulation defect, unspecified: Secondary | ICD-10-CM | POA: Diagnosis not present

## 2015-11-08 DIAGNOSIS — T83498A Other mechanical complication of other prosthetic devices, implants and grafts of genital tract, initial encounter: Secondary | ICD-10-CM | POA: Diagnosis not present

## 2015-11-08 DIAGNOSIS — I129 Hypertensive chronic kidney disease with stage 1 through stage 4 chronic kidney disease, or unspecified chronic kidney disease: Secondary | ICD-10-CM | POA: Insufficient documentation

## 2015-11-08 LAB — PROTIME-INR
INR: 1.14 (ref 0.00–1.49)
PROTHROMBIN TIME: 14.8 s (ref 11.6–15.2)

## 2015-11-08 LAB — CBC
HEMATOCRIT: 29.3 % — AB (ref 36.0–46.0)
Hemoglobin: 9.3 g/dL — ABNORMAL LOW (ref 12.0–15.0)
MCH: 31.2 pg (ref 26.0–34.0)
MCHC: 31.7 g/dL (ref 30.0–36.0)
MCV: 98.3 fL (ref 78.0–100.0)
PLATELETS: 71 10*3/uL — AB (ref 150–400)
RBC: 2.98 MIL/uL — ABNORMAL LOW (ref 3.87–5.11)
RDW: 17 % — AB (ref 11.5–15.5)
WBC: 5.5 10*3/uL (ref 4.0–10.5)

## 2015-11-08 NOTE — ED Notes (Signed)
Patient verbalizes understanding of discharge instructions, home care and follow up care. Patient out of department at this time. 

## 2015-11-08 NOTE — ED Notes (Signed)
Pt c/o dialysis catheter to chest bleeding after dialysis today. Bleeding controlled at this time.

## 2015-11-08 NOTE — ED Provider Notes (Signed)
CSN: [id number]     Arrival date & time 11/08/15  1951 History  By signing my name below, I, Budd Palmer, attest that this documentation has been prepared under the direction and in the presence of Vanetta Mulders, MD. Electronically Signed: Budd Palmer, ED Scribe. 11/08/2015. 9:53 PM.    Chief Complaint  Patient presents with  . Coagulation Disorder   The history is provided by the patient and a relative. No language interpreter was used.   HPI Comments: [name] is a 82 y.o. female with a PMHX of CHF, HTN, CKD, anemia, and DM brought in by ambulance who presents to the Emergency Department complaining of a coagulation disorder onset 2 hours ago. Pt states she was at dialysis today. She notes a catheter was placed in her right chest this morning, and dialysis was done this afternoon, with pt coming home at about 5 PM. Per relative, the bleeding then began at about 7:30 PM and pt was unable to control it, which is why she came to the ED. Pt notes the bleeding was controlled by EMS en route to the ED. She notes she has dialysis on Tues, Thurs, Sat.  She notes she is not on any blood thinners.   Past Medical History  Diagnosis Date  . CHF (congestive heart failure) (HCC)      preserved left ventricular systolic function  . Hypertension   . Overweight(278.02)   . Hypothyroid   . Vaginal pessary present ~ 2012  . CKD (chronic kidney disease) stage 4, GFR 15-29 ml/min (HCC)     [name] 02/16/2013  . Anemia associated with chronic renal failure     ESA therapy  . History of blood transfusion 1975  . Rheumatoid arthritis (HCC)   . Osteoporosis   . Pneumonia 02/2011    [name] 02/20/2011 (02/16/2013)  . Diabetic foot ulcers (HCC)     "get them on borh feet" (02/16/2013)  . Shortness of breath dyspnea     with exertion  . Type II diabetes mellitus (HCC)     patient denies   Past Surgical History  Procedure Laterality Date  . Cataract extraction w/ intraocular lens  implant, bilateral  Bilateral 1990's  . Orif femur fracture Left 1975    "got a rod and screw in it" (02/16/2013)  . Av fistula placement Left 01/26/2015    Procedure: LEFT ARM ARTERIOVENOUS (AV) FISTULA CREATION;  Surgeon: [name], MD;  Location: [facility] OR;  Service: Vascular;  Laterality: Left;   Family History  Problem Relation Age of Onset  . Stroke Other   . Heart disease Other   . Kidney disease Other   . Cancer Daughter     cervical  . Cancer Grandchild     lymph nodes  . Heart disease Mother   . Diabetes Father    Social History  Substance Use Topics  . Smoking status: Never Smoker   . Smokeless tobacco: Never Used  . Alcohol Use: No   OB History    No data available     Review of Systems  Constitutional: Negative for fever and chills.  HENT: Negative for rhinorrhea and sore throat.   Eyes: Negative for visual disturbance.  Respiratory: Negative for cough and shortness of breath.   Cardiovascular: Positive for leg swelling (baseline). Negative for chest pain.  Gastrointestinal: Negative for nausea, vomiting, abdominal pain and diarrhea.  Genitourinary: Negative for dysuria.  Musculoskeletal: Negative for back pain.  Skin: Positive for wound. Negative for rash.  Neurological: Negative for dizziness, light-headedness and headaches.  Hematological: Bruises/bleeds easily.  Psychiatric/Behavioral: Negative for confusion.    Allergies  Iodinated diagnostic agents  Home Medications   Prior to Admission medications   Medication Sig Start Date End Date Taking? Authorizing Provider  ALPRAZolam Prudy Feeler) 0.5 MG tablet Take 0.5 mg by mouth daily as needed for anxiety.    Yes Historical Provider, MD  amLODipine (NORVASC) 10 MG tablet Take 10 mg by mouth daily.     Yes Historical Provider, MD  fish oil-omega-3 fatty acids 1000 MG capsule Take 1 capsule by mouth daily.     Yes Historical Provider, MD  levothyroxine (SYNTHROID, LEVOTHROID) 125 MCG tablet Take 125 mcg by mouth daily before  breakfast.  12/16/14  Yes Historical Provider, MD  mupirocin ointment (BACTROBAN) 2 % Apply 1 application topically 2 (two) times daily.  10/21/15  Yes Historical Provider, MD  predniSONE (DELTASONE) 20 MG tablet Take 40 mg by mouth 3 (three) times daily. ONE TIME ORDER FOR 11/07/2015   Yes Historical Provider, MD  cephALEXin (KEFLEX) 500 MG capsule Take 1 capsule (500 mg total) by mouth 4 (four) times daily. Patient not taking: Reported on 11/08/2015 09/16/15   Bethann Berkshire, MD  HYDROcodone-acetaminophen (NORCO/VICODIN) 5-325 MG tablet Take 1 tablet by mouth every 6 (six) hours as needed for moderate pain. Patient not taking: Reported on 11/08/2015 09/16/15   Bethann Berkshire, MD   BP 123/70 mmHg  Pulse 79  Temp(Src) 98.5 F (36.9 C)  Resp 20  Ht 5\' 6"  (1.676 m)  Wt 80.74 kg  BMI 28.74 kg/m2  SpO2 99% Physical Exam  Constitutional: She is oriented to person, place, and time. She appears well-developed and well-nourished.  HENT:  Head: Normocephalic and atraumatic.  Mouth/Throat: Oropharynx is clear and moist.  Eyes: Conjunctivae and EOM are normal. Pupils are equal, round, and reactive to light. Right eye exhibits no discharge. Left eye exhibits no discharge. No scleral icterus.  Cardiovascular: Normal rate, regular rhythm and normal heart sounds.   Pulmonary/Chest: Effort normal and breath sounds normal. No respiratory distress.  Dialysis porta-cath into the neck on the right side, no active bleeding now  Abdominal: Soft. Bowel sounds are normal. There is no tenderness.  Musculoskeletal: She exhibits no edema.  No pitting edema to the BLE  Neurological: She is alert and oriented to person, place, and time. No cranial nerve deficit. She exhibits normal muscle tone. Coordination normal.  Skin: Skin is warm and dry. No rash noted. She is not diaphoretic. No erythema.  Psychiatric: She has a normal mood and affect.  Nursing note and vitals reviewed.   ED Course  Procedures  DIAGNOSTIC  STUDIES: Oxygen Saturation is 99% on RA, normal by my interpretation.    COORDINATION OF CARE: 9:53 PM - Discussed plans to cleanse the area and re-dress the wound. Pt advised of plan for treatment and pt agrees.  Labs Review Labs Reviewed  CBC - Abnormal; Notable for the following:    RBC 2.98 (*)    Hemoglobin 9.3 (*)    HCT 29.3 (*)    RDW 17.0 (*)    Platelets 71 (*)    All other components within normal limits  PROTIME-INR   Results for orders placed or performed during the hospital encounter of 11/08/15  Protime-INR  Result Value Ref Range   Prothrombin Time 14.8 11.6 - 15.2 seconds   INR 1.14 0.00 - 1.49  CBC  Result Value Ref Range   WBC 5.5 4.0 -  10.5 K/uL   RBC 2.98 (L) 3.87 - 5.11 MIL/uL   Hemoglobin 9.3 (L) 12.0 - 15.0 g/dL   HCT 74.1 (L) 28.7 - 86.7 %   MCV 98.3 78.0 - 100.0 fL   MCH 31.2 26.0 - 34.0 pg   MCHC 31.7 30.0 - 36.0 g/dL   RDW 67.2 (H) 09.4 - 70.9 %   Platelets 71 (L) 150 - 400 K/uL     Imaging Review No results found. I have personally reviewed and evaluated these images and lab results as part of my medical decision-making.   EKG Interpretation None      MDM   Final diagnoses:  Bleeding due to dialysis catheter placement, initial encounter [facility])   Patient is dialysis patient normally dialyzed Tuesdays Thursdays and Saturdays. Was dialyzed today but went in before hand since her AV fistula in her left arm was not functional due to a hematoma. They placed a catheter in her right upper chest that goes into the neck vessel. Following dialysis when she got home she had some bleeding from the area where the catheter enters the skin. No shortness of breath no trouble breathing. Bleeding stopped on the way here without any intervention from [place]. Patient's labs without significant changes. Patient stable for discharge home and to continue dialysis.  I personally performed the services described in this documentation, which was scribed in my  presence. The recorded information has been reviewed and is accurate.     Vanetta Mulders, MD 11/08/15 2227

## 2015-11-08 NOTE — ED Notes (Signed)
Patient moved to room 1 at this time. Bleeding controlled from dialysis port at this time. Patient talking on phone, NAD noted at this time, family member at bedside.

## 2015-11-08 NOTE — Discharge Instructions (Signed)
Continue your dialysis schedule. Return for any new or worse bleeding.

## 2015-11-09 ENCOUNTER — Emergency Department (HOSPITAL_COMMUNITY)
Admission: EM | Admit: 2015-11-09 | Discharge: 2015-11-09 | Disposition: A | Discharge status: Home / Self Care | Payer: Medicare Other | Attending: Emergency Medicine | Admitting: Emergency Medicine

## 2015-11-09 ENCOUNTER — Encounter (HOSPITAL_COMMUNITY): Payer: Self-pay | Admitting: Emergency Medicine

## 2015-11-09 DIAGNOSIS — T83498A Other mechanical complication of other prosthetic devices, implants and grafts of genital tract, initial encounter: Secondary | ICD-10-CM | POA: Diagnosis not present

## 2015-11-09 DIAGNOSIS — T82838A Hemorrhage of vascular prosthetic devices, implants and grafts, initial encounter: Secondary | ICD-10-CM | POA: Insufficient documentation

## 2015-11-09 DIAGNOSIS — Z862 Personal history of diseases of the blood and blood-forming organs and certain disorders involving the immune mechanism: Secondary | ICD-10-CM | POA: Diagnosis not present

## 2015-11-09 DIAGNOSIS — E119 Type 2 diabetes mellitus without complications: Secondary | ICD-10-CM | POA: Diagnosis not present

## 2015-11-09 DIAGNOSIS — N184 Chronic kidney disease, stage 4 (severe): Secondary | ICD-10-CM | POA: Diagnosis not present

## 2015-11-09 DIAGNOSIS — Z8701 Personal history of pneumonia (recurrent): Secondary | ICD-10-CM | POA: Insufficient documentation

## 2015-11-09 DIAGNOSIS — M069 Rheumatoid arthritis, unspecified: Secondary | ICD-10-CM | POA: Diagnosis not present

## 2015-11-09 DIAGNOSIS — E663 Overweight: Secondary | ICD-10-CM | POA: Diagnosis not present

## 2015-11-09 DIAGNOSIS — I5020 Unspecified systolic (congestive) heart failure: Secondary | ICD-10-CM | POA: Diagnosis not present

## 2015-11-09 DIAGNOSIS — Z79899 Other long term (current) drug therapy: Secondary | ICD-10-CM | POA: Diagnosis not present

## 2015-11-09 DIAGNOSIS — E039 Hypothyroidism, unspecified: Secondary | ICD-10-CM | POA: Diagnosis not present

## 2015-11-09 DIAGNOSIS — Y829 Unspecified medical devices associated with adverse incidents: Secondary | ICD-10-CM | POA: Insufficient documentation

## 2015-11-09 DIAGNOSIS — I129 Hypertensive chronic kidney disease with stage 1 through stage 4 chronic kidney disease, or unspecified chronic kidney disease: Secondary | ICD-10-CM | POA: Diagnosis not present

## 2015-11-09 DIAGNOSIS — T83198A Other mechanical complication of other urinary devices and implants, initial encounter: Secondary | ICD-10-CM | POA: Diagnosis not present

## 2015-11-09 DIAGNOSIS — Z7952 Long term (current) use of systemic steroids: Secondary | ICD-10-CM | POA: Insufficient documentation

## 2015-11-09 LAB — CBC WITH DIFFERENTIAL/PLATELET
Basophils Absolute: 0 10*3/uL (ref 0.0–0.1)
Basophils Relative: 0 %
EOS ABS: 0 10*3/uL (ref 0.0–0.7)
Eosinophils Relative: 0 %
HEMATOCRIT: 28.1 % — AB (ref 36.0–46.0)
HEMOGLOBIN: 8.7 g/dL — AB (ref 12.0–15.0)
LYMPHS ABS: 0.6 10*3/uL — AB (ref 0.7–4.0)
LYMPHS PCT: 10 %
MCH: 30 pg (ref 26.0–34.0)
MCHC: 31 g/dL (ref 30.0–36.0)
MCV: 96.9 fL (ref 78.0–100.0)
MONOS PCT: 8 %
Monocytes Absolute: 0.5 10*3/uL (ref 0.1–1.0)
NEUTROS ABS: 5.1 10*3/uL (ref 1.7–7.7)
NEUTROS PCT: 82 %
Platelets: 81 10*3/uL — ABNORMAL LOW (ref 150–400)
RBC: 2.9 MIL/uL — AB (ref 3.87–5.11)
RDW: 17 % — ABNORMAL HIGH (ref 11.5–15.5)
WBC: 6.3 10*3/uL (ref 4.0–10.5)

## 2015-11-09 LAB — BASIC METABOLIC PANEL
Anion gap: 15 (ref 5–15)
BUN: 30 mg/dL — AB (ref 6–20)
CALCIUM: 8.5 mg/dL — AB (ref 8.9–10.3)
CHLORIDE: 99 mmol/L — AB (ref 101–111)
CO2: 27 mmol/L (ref 22–32)
Creatinine, Ser: 2.79 mg/dL — ABNORMAL HIGH (ref 0.44–1.00)
GFR calc Af Amer: 17 mL/min — ABNORMAL LOW (ref >60)
GFR, EST NON AFRICAN AMERICAN: 15 mL/min — AB (ref >60)
Glucose, Bld: 144 mg/dL — ABNORMAL HIGH (ref 65–99)
POTASSIUM: 3.9 mmol/L (ref 3.5–5.1)
Sodium: 141 mmol/L (ref 135–145)

## 2015-11-09 LAB — PROTIME-INR
INR: 1.08 (ref 0.00–1.49)
PROTHROMBIN TIME: 14.2 s (ref 11.6–15.2)

## 2015-11-09 NOTE — ED Notes (Signed)
Redressed dressing.  Clots formed underneath.   Did not break clots up.   Bleeding controlled at this time.

## 2015-11-09 NOTE — ED Notes (Signed)
In with Dr. Juel Burrow to assist while he is stitching patient up.    Sterile procedure.   Patient tolerated well.

## 2015-11-09 NOTE — ED Notes (Signed)
Dressing applied to to cath placement.

## 2015-11-09 NOTE — ED Notes (Signed)
Per Dr. Juel Burrow, patient to wait an hour before being discharged to ensure bleeding has stopped.  Advised Dr. Corlis Leak of same.

## 2015-11-09 NOTE — ED Notes (Signed)
Patient presents no new bleeding at this time.

## 2015-11-09 NOTE — ED Notes (Signed)
MD at bedside. 

## 2015-11-09 NOTE — ED Provider Notes (Signed)
Patietn was still oozing when planned for discharge at 8 am. Talked to neprhology, Dr. Juel Burrow to come see.  10:24 AM Dr. Juel Burrow saw patient, placed stitches, wants to watch her here for another hour and then place to discharge home.     Abeni Finchum Randall An, MD 11/09/15 1024

## 2015-11-09 NOTE — ED Notes (Signed)
Patient arrived to ED via [facility]. EMS reports: patient experienced bleeding at Right EJ sight (dialysis). No active bleeding at this time. Patient had been to [facility] for same complaint at approx 2100. VSS. BP 124/, Resp 20. Patient AOx4. No other complaints.

## 2015-11-09 NOTE — ED Notes (Signed)
Pt sitting up to dress and blood trickles from under dressing. Dr. Veronia Beets informed of same.

## 2015-11-09 NOTE — ED Notes (Signed)
Pt and family verbalize understanding of instructions. 

## 2015-11-09 NOTE — Discharge Instructions (Signed)
Dr. Juel Burrow will see you in the office at 1 PM if you continue to have additional problems. Call his office at [phone] if you plan to see him at that time.

## 2015-11-09 NOTE — ED Provider Notes (Signed)
CSN: [id number]     Arrival date & time 11/09/15  0050 History   By signing my name below, I, Arlan Organ, attest that this documentation has been prepared under the direction and in the presence of Geoffery Lyons, MD.  Electronically Signed: Arlan Organ, ED Scribe. 11/09/2015. 4:05 AM.   Chief Complaint  Patient presents with  . Vascular Access Problem   The history is provided by the patient. No language interpreter was used.    HPI Comments: Tzipora Mcinroy Grams is a 82 y.o. female with a PMHx of CHF, HTN, CKD, and DM who presents to the Emergency Department here for a vascular access issue this evening. Pt states she noted bleeding from the catheter placed in her R neck vessel after being placed this morning. Pt states catheter was placed as her AV fistula was no longer functioning properly in her L arm. No active bleeding at this time. No recent fever or chills. Pt was evaluated at [facility] earlier at approximately 2100 for same. At that time, pt was safely discharged home and cleared to continue dialysis. However, pt returned this evening as the site began to bleed again. Pt typically dialyzes on Tuesdays, Thursdays, and Saturdays. No known allergies to medications.  PCP: [facility]    Past Medical History  Diagnosis Date  . CHF (congestive heart failure) (HCC)      preserved left ventricular systolic function  . Hypertension   . Overweight(278.02)   . Hypothyroid   . Vaginal pessary present ~ 2012  . CKD (chronic kidney disease) stage 4, GFR 15-29 ml/min (HCC)     [name] 02/16/2013  . Anemia associated with chronic renal failure     ESA therapy  . History of blood transfusion 1975  . Rheumatoid arthritis (HCC)   . Osteoporosis   . Pneumonia 02/2011    [name] 02/20/2011 (02/16/2013)  . Diabetic foot ulcers (HCC)     "get them on borh feet" (02/16/2013)  . Shortness of breath dyspnea     with exertion  . Type II diabetes mellitus (HCC)     patient denies    Past Surgical History  Procedure Laterality Date  . Cataract extraction w/ intraocular lens  implant, bilateral Bilateral 1990's  . Orif femur fracture Left 1975    "got a rod and screw in it" (02/16/2013)  . Av fistula placement Left 01/26/2015    Procedure: LEFT ARM ARTERIOVENOUS (AV) FISTULA CREATION;  Surgeon: [name], MD;  Location: [facility] OR;  Service: Vascular;  Laterality: Left;   Family History  Problem Relation Age of Onset  . Stroke Other   . Heart disease Other   . Kidney disease Other   . Cancer Daughter     cervical  . Cancer Grandchild     lymph nodes  . Heart disease Mother   . Diabetes Father    Social History  Substance Use Topics  . Smoking status: Never Smoker   . Smokeless tobacco: Never Used  . Alcohol Use: No   OB History    No data available     Review of Systems  Constitutional: Negative for fever and chills.  Respiratory: Negative for cough and shortness of breath.   Cardiovascular: Negative for chest pain.  Gastrointestinal: Negative for nausea, vomiting and abdominal pain.  Skin: Positive for wound.  Neurological: Negative for headaches.  Psychiatric/Behavioral: Negative for confusion.  All other systems reviewed and are negative.     Allergies  Iodinated  diagnostic agents  Home Medications   Prior to Admission medications   Medication Sig Start Date End Date Taking? Authorizing Provider  ALPRAZolam Prudy Feeler) 0.5 MG tablet Take 0.5 mg by mouth daily as needed for anxiety.    Yes Historical Provider, MD  amLODipine (NORVASC) 10 MG tablet Take 10 mg by mouth daily.     Yes Historical Provider, MD  fish oil-omega-3 fatty acids 1000 MG capsule Take 1 capsule by mouth daily.     Yes Historical Provider, MD  levothyroxine (SYNTHROID, LEVOTHROID) 125 MCG tablet Take 125 mcg by mouth daily before breakfast.  12/16/14  Yes Historical Provider, MD  mupirocin ointment (BACTROBAN) 2 % Apply 1 application topically 2 (two) times daily.  10/21/15  Yes  Historical Provider, MD  predniSONE (DELTASONE) 20 MG tablet Take 40 mg by mouth 3 (three) times daily. ONE TIME ORDER FOR 11/07/2015   Yes Historical Provider, MD  cephALEXin (KEFLEX) 500 MG capsule Take 1 capsule (500 mg total) by mouth 4 (four) times daily. Patient not taking: Reported on 11/08/2015 09/16/15   Bethann Berkshire, MD  HYDROcodone-acetaminophen (NORCO/VICODIN) 5-325 MG tablet Take 1 tablet by mouth every 6 (six) hours as needed for moderate pain. Patient not taking: Reported on 11/08/2015 09/16/15   Bethann Berkshire, MD   Triage Vitals: BP 124/57 mmHg  Pulse 77  Temp(Src) 97.6 F (36.4 C) (Oral)  Resp 14  SpO2 95%   Physical Exam  Constitutional: She is oriented to person, place, and time. She appears well-developed and well-nourished. No distress.  HENT:  Head: Normocephalic and atraumatic.  Eyes: EOM are normal.  Neck: Normal range of motion.  Cardiovascular: Normal rate, regular rhythm and normal heart sounds.   Pulmonary/Chest: Effort normal and breath sounds normal.  Abdominal: Soft. She exhibits no distension. There is no tenderness.  Musculoskeletal: Normal range of motion.  Neurological: She is alert and oriented to person, place, and time.  Skin: Skin is warm and dry.  There is a dialysis catheter to the R subclavian that travels to the R EJ. There is a slight trickle of blood present where catheter enters the skin. No redness, warmth, or erythema.  Psychiatric: She has a normal mood and affect. Judgment normal.  Nursing note and vitals reviewed.   ED Course  Procedures (including critical care time)  DIAGNOSTIC STUDIES: Oxygen Saturation is 95% on RA, adequate by my interpretation.    COORDINATION OF CARE: 3:53 AM-Discussed treatment plan with pt at bedside and pt agreed to plan.     Labs Review Labs Reviewed - No data to display  Imaging Review No results found. I have personally reviewed and evaluated these images and lab results as part of my medical  decision-making.    MDM   Final diagnoses:  None    Patient is an 82 year old female with history of end-stage renal disease on hemodialysis. She had a dialysis catheter placed yesterday by Dr. Juel Burrow. She presents today with bleeding from the catheter site. Direct pressure has been applied and it has also been dressed with combat gauze, however hemostasis has not been achieved. I spoke with Dr. Juel Burrow from vascular surgery who is willing to see the patient in the office at 1 PM. She will be discharged and advised to follow-up with him. She is hemodynamically stable and has not had a significant drop in her hemoglobin.  I personally performed the services described in this documentation, which was scribed in my presence. The recorded information has been reviewed and is  accurate.       Geoffery Lyons, MD 11/17/15 2303

## 2015-11-10 DIAGNOSIS — D689 Coagulation defect, unspecified: Secondary | ICD-10-CM | POA: Diagnosis not present

## 2015-11-10 DIAGNOSIS — N186 End stage renal disease: Secondary | ICD-10-CM | POA: Diagnosis not present

## 2015-11-10 DIAGNOSIS — Z7689 Persons encountering health services in other specified circumstances: Secondary | ICD-10-CM | POA: Diagnosis not present

## 2015-11-10 DIAGNOSIS — E1129 Type 2 diabetes mellitus with other diabetic kidney complication: Secondary | ICD-10-CM | POA: Diagnosis not present

## 2015-11-10 DIAGNOSIS — D631 Anemia in chronic kidney disease: Secondary | ICD-10-CM | POA: Diagnosis not present

## 2015-11-10 DIAGNOSIS — Z23 Encounter for immunization: Secondary | ICD-10-CM | POA: Diagnosis not present

## 2015-11-11 ENCOUNTER — Ambulatory Visit (HOSPITAL_COMMUNITY): Payer: Medicare Other | Attending: Family Medicine | Admitting: Physical Therapy

## 2015-11-11 ENCOUNTER — Telehealth (HOSPITAL_COMMUNITY): Payer: Self-pay | Admitting: Physical Therapy

## 2015-11-11 DIAGNOSIS — S91109D Unspecified open wound of unspecified toe(s) without damage to nail, subsequent encounter: Secondary | ICD-10-CM | POA: Insufficient documentation

## 2015-11-11 DIAGNOSIS — I890 Lymphedema, not elsewhere classified: Secondary | ICD-10-CM | POA: Insufficient documentation

## 2015-11-11 DIAGNOSIS — S81802D Unspecified open wound, left lower leg, subsequent encounter: Secondary | ICD-10-CM | POA: Insufficient documentation

## 2015-11-11 DIAGNOSIS — R262 Difficulty in walking, not elsewhere classified: Secondary | ICD-10-CM | POA: Insufficient documentation

## 2015-11-11 DIAGNOSIS — S81801D Unspecified open wound, right lower leg, subsequent encounter: Secondary | ICD-10-CM | POA: Insufficient documentation

## 2015-11-11 NOTE — Telephone Encounter (Signed)
Pt missed appt.  Called secretary [facility]) and stated the [facility] did not pick her up.  According to [facility] she would not come to the door.  Secretary stated pt was very upset.  Reminded of next appt Therapist also tried to call patient (no answer or voicemail) prior to Glass blower/designer had spoken to patient.  Message left with grand daughter Lupita Leash Laible) regarding missed appointment as well.  Lurena Nida, PTA/CLT [phone]

## 2015-11-12 DIAGNOSIS — Z23 Encounter for immunization: Secondary | ICD-10-CM | POA: Diagnosis not present

## 2015-11-12 DIAGNOSIS — D631 Anemia in chronic kidney disease: Secondary | ICD-10-CM | POA: Diagnosis not present

## 2015-11-12 DIAGNOSIS — E1129 Type 2 diabetes mellitus with other diabetic kidney complication: Secondary | ICD-10-CM | POA: Diagnosis not present

## 2015-11-12 DIAGNOSIS — Z7689 Persons encountering health services in other specified circumstances: Secondary | ICD-10-CM | POA: Diagnosis not present

## 2015-11-12 DIAGNOSIS — D689 Coagulation defect, unspecified: Secondary | ICD-10-CM | POA: Diagnosis not present

## 2015-11-12 DIAGNOSIS — N186 End stage renal disease: Secondary | ICD-10-CM | POA: Diagnosis not present

## 2015-11-14 ENCOUNTER — Ambulatory Visit (HOSPITAL_COMMUNITY): Payer: Medicare Other | Admitting: Physical Therapy

## 2015-11-14 DIAGNOSIS — Z7689 Persons encountering health services in other specified circumstances: Secondary | ICD-10-CM | POA: Diagnosis not present

## 2015-11-14 DIAGNOSIS — S91109D Unspecified open wound of unspecified toe(s) without damage to nail, subsequent encounter: Secondary | ICD-10-CM | POA: Diagnosis not present

## 2015-11-14 DIAGNOSIS — R262 Difficulty in walking, not elsewhere classified: Secondary | ICD-10-CM

## 2015-11-14 DIAGNOSIS — I890 Lymphedema, not elsewhere classified: Secondary | ICD-10-CM | POA: Diagnosis not present

## 2015-11-14 DIAGNOSIS — S81801D Unspecified open wound, right lower leg, subsequent encounter: Secondary | ICD-10-CM | POA: Diagnosis not present

## 2015-11-14 DIAGNOSIS — S81802D Unspecified open wound, left lower leg, subsequent encounter: Secondary | ICD-10-CM | POA: Diagnosis not present

## 2015-11-14 NOTE — Therapy (Signed)
[facility] [address] [place], [place], [address] Phone: [phone]   Fax:  [phone]  Wound Care Therapy  Patient Details  Name: [name] MRN: [id number] Date of Birth: 30-Aug-1932 No Data Recorded  Encounter Date: 11/14/2015      [facility]ley End of Session - 11/14/15 1326    Visit Number 2   Number of Visits 12   Date for Farley Re-Evaluation 12/05/15   Authorization Type UHC Medicare and medicaid;   Grand daughter, Vivienne Sangiovanni is CG  [phone]   Authorization Time Period 11/07/15 to 12/19/15    Authorization - Visit Number 2   Authorization - Number of Visits 10   Farley Start Time 1148   Farley Stop Time 1235  was with patient until about 1245 but removed last 10 minutes due to non skilled care of dressing changes on other woudns    Farley Time Calculation (min) 47 min   Activity Tolerance Patient tolerated treatment well   Behavior During Therapy [facility] for tasks assessed/performed      Past Medical History  Diagnosis Date  . CHF (congestive heart failure) (HCC)      preserved left ventricular systolic function  . Hypertension   . Overweight(278.02)   . Hypothyroid   . Vaginal pessary present ~ 2012  . CKD (chronic kidney disease) stage 4, GFR 15-29 ml/min (HCC)     [name] 02/16/2013  . Anemia associated with chronic renal failure     ESA therapy  . History of blood transfusion 1975  . Rheumatoid arthritis (HCC)   . Osteoporosis   . Pneumonia 02/2011    [name] 02/20/2011 (02/16/2013)  . Diabetic foot ulcers (HCC)     "get them on borh feet" (02/16/2013)  . Shortness of breath dyspnea     with exertion  . Type II diabetes mellitus (HCC)     patient denies    Past Surgical History  Procedure Laterality Date  . Cataract extraction w/ intraocular lens  implant, bilateral Bilateral 1990's  . Orif femur fracture Left 1975    "got a rod and screw in it" (02/16/2013)  . Av fistula placement Left 01/26/2015    Procedure: LEFT ARM  ARTERIOVENOUS (AV) FISTULA CREATION;  Surgeon: [name], MD;  [facility];  Service: Vascular;  Laterality: Left;    There were no vitals filed for this visit.  Visit Diagnosis:  Open toe wound, subsequent encounter  Difficulty walking                 Wound Therapy - 11/14/15 1314    Subjective Patient arrives reporting that she is fatigued due to kidney treatments but otherwise OK, no pain today    Patient and Family Stated Goals get all wounds healed up    Prior Treatments self treat   Pain Assessment No/denies pain   Pain Score 0-No pain   Wound Properties Date First Assessed: 08/08/15 Location: Toe (Comment  which one) Location Orientation: Left;Other (Comment) , 2nd toe   Wound Description (Comments): 2nd toe  Present on Admission: Yes Final Assessment Date: 09/27/15 Final Assessment Time: 1428   Dressing Type Gauze (Comment);Impregnated gauze (bismuth);Paper tape   Dressing Changed New   Dressing Status Old drainage   Dressing Change Frequency PRN   Site / Wound Assessment Yellow;Granulation tissue   % Wound base Red or Granulating 55%   % Wound base Yellow 45%   % Wound base Black 0%   Peri-wound Assessment  Edema;Other (Comment)   Closure None   Drainage Amount Scant   Drainage Description Serosanguineous   Treatment Cleansed;Debridement (Selective);Packing (Impregnated strip);Other (Comment);Tape changed  medihoney, debridement, gauze, paper tape    Wound Properties 2nd L toe wound    Selective Debridement - Location 2nd L toe wound    Selective Debridement - Tools Used Forceps;Scalpel   Selective Debridement - Tissue Removed slough    Wound Therapy - Clinical Statement Patient presents for wound treatemnt; order has not returned for R LE. Continued to treat L toe wound with medihoney and gauze today, also changed dressings over other areas on L LE as well but did not debride or provided skilled treatment to these areas. Checked R LE and found only dry  skin with no sign of wound on back of LE, put vasoline and gauze over this as well. Recommend continued skilled Farley services to prmote wound closutre.  Discounted time during skilled Farley session that was used to provide non-skileld care (dressing changes and vasoline/gauze ) to other areas of LEs.    Wound Therapy - Functional Problem List non-healing wound    Factors Delaying/Impairing Wound Healing Altered sensation;Diabetes Mellitus;Polypharmacy;Immobility   Wound Therapy - Frequency Other (comment)   Wound Therapy - Current Recommendations Farley   Wound Plan medihoney                  [facility] Education - 11/14/15 1326    Education provided No                     Problem List Patient Active Problem List   Diagnosis Date Noted  . Uterovaginal prolapse, complete 03/26/2013  . Diabetic foot ulcers (HCC) 02/16/2013  . Hyperkalemia 06/24/2012  . Hyperlipidemia 05/20/2012  . Uterine prolapse 05/19/2012  . Chronic kidney disease, stage 4, severely decreased GFR (HCC)   . Hypertension   . Anemia associated with chronic renal failure   . Hypothyroid   . CAROTID BRUIT, LEFT 05/11/2009  . DIABETES MELLITUS 03/11/2009  . OVERWEIGHT/OBESITY 03/11/2009    [name], DPT [phone]  [facility] [address] [place], [place], [address] Phone: [phone]   Fax:  [phone]  Name: [name] MRN: [id number] Date of Birth: 01-31-1932

## 2015-11-15 DIAGNOSIS — D689 Coagulation defect, unspecified: Secondary | ICD-10-CM | POA: Diagnosis not present

## 2015-11-15 DIAGNOSIS — D631 Anemia in chronic kidney disease: Secondary | ICD-10-CM | POA: Diagnosis not present

## 2015-11-15 DIAGNOSIS — E1129 Type 2 diabetes mellitus with other diabetic kidney complication: Secondary | ICD-10-CM | POA: Diagnosis not present

## 2015-11-15 DIAGNOSIS — Z23 Encounter for immunization: Secondary | ICD-10-CM | POA: Diagnosis not present

## 2015-11-15 DIAGNOSIS — N186 End stage renal disease: Secondary | ICD-10-CM | POA: Diagnosis not present

## 2015-11-15 DIAGNOSIS — Z7689 Persons encountering health services in other specified circumstances: Secondary | ICD-10-CM | POA: Diagnosis not present

## 2015-11-17 DIAGNOSIS — Z7689 Persons encountering health services in other specified circumstances: Secondary | ICD-10-CM | POA: Diagnosis not present

## 2015-11-17 DIAGNOSIS — Z23 Encounter for immunization: Secondary | ICD-10-CM | POA: Diagnosis not present

## 2015-11-17 DIAGNOSIS — D689 Coagulation defect, unspecified: Secondary | ICD-10-CM | POA: Diagnosis not present

## 2015-11-17 DIAGNOSIS — N186 End stage renal disease: Secondary | ICD-10-CM | POA: Diagnosis not present

## 2015-11-17 DIAGNOSIS — E1129 Type 2 diabetes mellitus with other diabetic kidney complication: Secondary | ICD-10-CM | POA: Diagnosis not present

## 2015-11-17 DIAGNOSIS — D631 Anemia in chronic kidney disease: Secondary | ICD-10-CM | POA: Diagnosis not present

## 2015-11-18 ENCOUNTER — Ambulatory Visit (HOSPITAL_COMMUNITY): Payer: Medicare Other

## 2015-11-18 DIAGNOSIS — R262 Difficulty in walking, not elsewhere classified: Secondary | ICD-10-CM | POA: Diagnosis not present

## 2015-11-18 DIAGNOSIS — S91109D Unspecified open wound of unspecified toe(s) without damage to nail, subsequent encounter: Secondary | ICD-10-CM

## 2015-11-18 DIAGNOSIS — Z7689 Persons encountering health services in other specified circumstances: Secondary | ICD-10-CM | POA: Diagnosis not present

## 2015-11-18 DIAGNOSIS — I890 Lymphedema, not elsewhere classified: Secondary | ICD-10-CM | POA: Diagnosis not present

## 2015-11-18 DIAGNOSIS — S81802D Unspecified open wound, left lower leg, subsequent encounter: Secondary | ICD-10-CM | POA: Diagnosis not present

## 2015-11-18 DIAGNOSIS — S81801D Unspecified open wound, right lower leg, subsequent encounter: Secondary | ICD-10-CM | POA: Diagnosis not present

## 2015-11-18 NOTE — Therapy (Signed)
[facility] [address] [place], [place], [address] Phone: [phone]   Fax:  [phone]  Wound Care Therapy  Patient Details  Name: [name] MRN: [id number] Date of Birth: 08/13/1932 No Data Recorded  Encounter Date: 11/18/2015      PT End of Session - 11/18/15 1052    Visit Number 3   Number of Visits 12   Date for PT Re-Evaluation 12/05/15   Authorization Type UHC Medicare and medicaid;   Grand daughter, Nyleah Mcginnis is CG  [phone]   Authorization Time Period 11/07/15 to 12/19/15    Authorization - Visit Number 3   Authorization - Number of Visits 10   PT Start Time 1021   PT Stop Time 1052   PT Time Calculation (min) 31 min   Activity Tolerance Patient tolerated treatment well   Behavior During Therapy [facility] for tasks assessed/performed      Past Medical History  Diagnosis Date  . CHF (congestive heart failure) (HCC)      preserved left ventricular systolic function  . Hypertension   . Overweight(278.02)   . Hypothyroid   . Vaginal pessary present ~ 2012  . CKD (chronic kidney disease) stage 4, GFR 15-29 ml/min (HCC)     [name] 02/16/2013  . Anemia associated with chronic renal failure     ESA therapy  . History of blood transfusion 1975  . Rheumatoid arthritis (HCC)   . Osteoporosis   . Pneumonia 02/2011    [name] 02/20/2011 (02/16/2013)  . Diabetic foot ulcers (HCC)     "get them on borh feet" (02/16/2013)  . Shortness of breath dyspnea     with exertion  . Type II diabetes mellitus (HCC)     patient denies    Past Surgical History  Procedure Laterality Date  . Cataract extraction w/ intraocular lens  implant, bilateral Bilateral 1990's  . Orif femur fracture Left 1975    "got a rod and screw in it" (02/16/2013)  . Av fistula placement Left 01/26/2015    Procedure: LEFT ARM ARTERIOVENOUS (AV) FISTULA CREATION;  Surgeon: [name], MD;  Location: [facility] OR;  Service: Vascular;  Laterality: Left;    There  were no vitals filed for this visit.  Visit Diagnosis:  Open toe wound, subsequent encounter  Difficulty walking  Multiple open wounds of lower leg, right, subsequent encounter      Subjective Assessment - 11/18/15 1105    Subjective Pt stated generalized dull soreness today, pain scale 6/10   Currently in Pain? Yes   Pain Score 6    Pain Location Generalized   Pain Orientation Right;Left   Pain Descriptors / Indicators Dull;Sore                   Wound Therapy - 11/18/15 1106    Subjective Pt stated generalized dull soreness today, pain scale 6/10   Patient and Family Stated Goals get all wounds healed up    Prior Treatments self treat   Pain Assessment 0-10   Pain Score 6    Pain Location Generalized   Pain Orientation Right;Left   Pain Descriptors / Indicators Dull;Sore   Wound Properties Date First Assessed: 08/08/15 Location: Toe (Comment  which one) Location Orientation: Left;Other (Comment) , 2nd toe   Wound Description (Comments): 2nd toe  Present on Admission: Yes Final Assessment Date: 09/27/15 Final Assessment Time: 1428   Dressing Type Impregnated gauze (petrolatum)  xeroform, 2x2 and medapore tape  Dressing Changed Changed   Dressing Status Old drainage   Dressing Change Frequency PRN   Site / Wound Assessment Granulation tissue   % Wound base Red or Granulating 95%   % Wound base Yellow 5%   % Wound base Black 0%   Peri-wound Assessment Edema;Other (Comment)   Margins Unattached edges (unapproximated)   Closure None   Drainage Amount Scant   Drainage Description Serosanguineous   Treatment Cleansed;Debridement (Selective)   Wound Properties Date First Assessed: 08/17/15 Wound Type: Other (Comment) , R LE posterior medial   Location: Other (Comment) , R shin posterior medial   Location Orientation: Right;Posterior;Medial Wound Description (Comments): R shin posterior medial  Present on Admission: Yes   Selective Debridement - Location 2nd L toe  wound    Selective Debridement - Tools Used Forceps;Scalpel   Selective Debridement - Tissue Removed slough    Wound Therapy - Clinical Statement Session focus on wound Lt LE 2nd toe only this session.  Toe presents with improved granulation tissues and minimal slough needed for debridement.  With reduction in slough and improved granulation tissues changed dressings to xeroform with 2x2 and metapore tape.  Pt wearing compression hose this session, assisted reapplying hose for edema control.  No reports of pain in 2nd toe through session.     Wound Therapy - Functional Problem List non-healing wound    Factors Delaying/Impairing Wound Healing Altered sensation;Diabetes Mellitus;Polypharmacy;Immobility   Wound Therapy - Frequency Other (comment)  2x/week   Wound Therapy - Current Recommendations PT   Wound Plan Continue appropriate wound care dressings   Dressing  xeroform, 2x2, medipore tape and compression hose         Problem List Patient Active Problem List   Diagnosis Date Noted  . Uterovaginal prolapse, complete 03/26/2013  . Diabetic foot ulcers (HCC) 02/16/2013  . Hyperkalemia 06/24/2012  . Hyperlipidemia 05/20/2012  . Uterine prolapse 05/19/2012  . Chronic kidney disease, stage 4, severely decreased GFR (HCC)   . Hypertension   . Anemia associated with chronic renal failure   . Hypothyroid   . CAROTID BRUIT, LEFT 05/11/2009  . DIABETES MELLITUS 03/11/2009  . OVERWEIGHT/OBESITY 03/11/2009   Becky Sax, LPTA; CBIS [phone]  Juel Burrow 11/18/2015, 12:27 PM  [facility] [address] [place], [place], [address] Phone: [phone]   Fax:  [phone]  Name: [name] MRN: [id number] Date of Birth: Jun 13, 1932

## 2015-11-19 DIAGNOSIS — Z7689 Persons encountering health services in other specified circumstances: Secondary | ICD-10-CM | POA: Diagnosis not present

## 2015-11-19 DIAGNOSIS — N186 End stage renal disease: Secondary | ICD-10-CM | POA: Diagnosis not present

## 2015-11-19 DIAGNOSIS — Z23 Encounter for immunization: Secondary | ICD-10-CM | POA: Diagnosis not present

## 2015-11-19 DIAGNOSIS — D631 Anemia in chronic kidney disease: Secondary | ICD-10-CM | POA: Diagnosis not present

## 2015-11-19 DIAGNOSIS — D689 Coagulation defect, unspecified: Secondary | ICD-10-CM | POA: Diagnosis not present

## 2015-11-19 DIAGNOSIS — E1129 Type 2 diabetes mellitus with other diabetic kidney complication: Secondary | ICD-10-CM | POA: Diagnosis not present

## 2015-11-22 ENCOUNTER — Ambulatory Visit (HOSPITAL_COMMUNITY): Payer: Medicare Other | Admitting: Physical Therapy

## 2015-11-22 DIAGNOSIS — D689 Coagulation defect, unspecified: Secondary | ICD-10-CM | POA: Diagnosis not present

## 2015-11-22 DIAGNOSIS — D631 Anemia in chronic kidney disease: Secondary | ICD-10-CM | POA: Diagnosis not present

## 2015-11-22 DIAGNOSIS — E1129 Type 2 diabetes mellitus with other diabetic kidney complication: Secondary | ICD-10-CM | POA: Diagnosis not present

## 2015-11-22 DIAGNOSIS — N186 End stage renal disease: Secondary | ICD-10-CM | POA: Diagnosis not present

## 2015-11-22 DIAGNOSIS — Z23 Encounter for immunization: Secondary | ICD-10-CM | POA: Diagnosis not present

## 2015-11-22 DIAGNOSIS — Z7689 Persons encountering health services in other specified circumstances: Secondary | ICD-10-CM | POA: Diagnosis not present

## 2015-11-23 ENCOUNTER — Ambulatory Visit (HOSPITAL_COMMUNITY): Payer: Medicare Other | Admitting: Physical Therapy

## 2015-11-23 DIAGNOSIS — S91109D Unspecified open wound of unspecified toe(s) without damage to nail, subsequent encounter: Secondary | ICD-10-CM | POA: Diagnosis not present

## 2015-11-23 DIAGNOSIS — R262 Difficulty in walking, not elsewhere classified: Secondary | ICD-10-CM

## 2015-11-23 DIAGNOSIS — I890 Lymphedema, not elsewhere classified: Secondary | ICD-10-CM | POA: Diagnosis not present

## 2015-11-23 DIAGNOSIS — S81801D Unspecified open wound, right lower leg, subsequent encounter: Secondary | ICD-10-CM | POA: Diagnosis not present

## 2015-11-23 DIAGNOSIS — S81802D Unspecified open wound, left lower leg, subsequent encounter: Secondary | ICD-10-CM | POA: Diagnosis not present

## 2015-11-23 DIAGNOSIS — Z7689 Persons encountering health services in other specified circumstances: Secondary | ICD-10-CM | POA: Diagnosis not present

## 2015-11-23 NOTE — Therapy (Signed)
[name] [address] [place], [place], [address] Phone: [phone]   Fax:  [phone]  Wound Care Therapy  Patient Details  Name: [name] MRN: [id number] Date of Birth: [birth year] No Data Recorded  Encounter Date: 11/23/2015      PT End of Session - 11/23/15 1204    Visit Number 4   Number of Visits 12   Authorization Type UHC Medicare and medicaid;   Grand daughter, Grisell Bissette is CG  [phone]   Authorization Time Period 11/07/15 to 12/19/15    Authorization - Visit Number 4   Authorization - Number of Visits 10   PT Start Time 0930   PT Stop Time 1045   PT Time Calculation (min) 75 min   Activity Tolerance Patient tolerated treatment well   Behavior During Therapy [facility] for tasks assessed/performed      Past Medical History  Diagnosis Date  . CHF (congestive heart failure) (HCC)      preserved left ventricular systolic function  . Hypertension   . Overweight(278.02)   . Hypothyroid   . Vaginal pessary present ~ 2012  . CKD (chronic kidney disease) stage 4, GFR 15-29 ml/min (HCC)     [name] 02/16/2013  . Anemia associated with chronic renal failure     ESA therapy  . History of blood transfusion 1975  . Rheumatoid arthritis (HCC)   . Osteoporosis   . Pneumonia 02/2011    [name] 02/20/2011 (02/16/2013)  . Diabetic foot ulcers (HCC)     "get them on borh feet" (02/16/2013)  . Shortness of breath dyspnea     with exertion  . Type II diabetes mellitus (HCC)     patient denies    Past Surgical History  Procedure Laterality Date  . Cataract extraction w/ intraocular lens  implant, bilateral Bilateral 1990's  . Orif femur fracture Left 1975    "got a rod and screw in it" (02/16/2013)  . Av fistula placement Left 01/26/2015    Procedure: LEFT ARM ARTERIOVENOUS (AV) FISTULA CREATION;  Surgeon: [name], MD;  Location: [facility];  Service: Vascular;  Laterality: Left;    There were no vitals filed for this  visit.  Visit Diagnosis:  Open toe wound, subsequent encounter  Difficulty walking  Multiple open wounds of lower leg, right, subsequent encounter  Lymphedema                 Wound Therapy - 11/23/15 1156    Subjective [name] states that she feels that her wounds are almost healed.  She has a new prescription to evaluate her Rt leg.    Patient and Family Stated Goals get all wounds healed up    Prior Treatments self treat   Wound Properties Date First Assessed: 08/08/15 Location: Toe (Comment  which one) Location Orientation: Left;Other (Comment) , 2nd toe   Wound Description (Comments): 2nd toe  Present on Admission: Yes Final Assessment Date: 09/27/15 Final Assessment Time: 1428   Dressing Type Impregnated gauze (petrolatum)  xeroform, 2x2 and medapore tape   Dressing Status Old drainage   Dressing Change Frequency PRN   Site / Wound Assessment Granulation tissue   % Wound base Red or Granulating 100%   % Wound base Yellow 0%   % Wound base Black 0%   Peri-wound Assessment Edema;Other (Comment)   Wound Length (cm) 0.3 cm   Wound Width (cm) 0.2 cm   Margins Unattached edges (unapproximated)   Closure None  Drainage Amount Scant   Drainage Description Serosanguineous   Treatment Cleansed;Debridement (Selective)   Wound Properties Date First Assessed: 11/23/15 Time First Assessed: 0947 Location: Leg Location Orientation: Left;Anterior Wound Description (Comments): distal shin Present on Admission: Yes   Dressing Type Impregnated gauze (bismuth)   Dressing Status Old drainage   Dressing Change Frequency PRN   Site / Wound Assessment Granulation tissue   % Wound base Red or Granulating 100%   % Wound base Yellow 0%   Peri-wound Assessment Edema   Wound Length (cm) 0.3 cm   Wound Width (cm) 0.3 cm   Drainage Amount Scant   Drainage Description Serosanguineous   Treatment Cleansed;Debridement (Selective)   Wound Properties Date First Assessed: 11/23/15 Time  First Assessed: 0944 Wound Type: Other (Comment) Location: Leg Location Orientation: Anterior;Left Present on Admission: Yes   Wound Properties Date First Assessed: 08/17/15 Wound Type: Other (Comment) , R LE posterior medial   Location: Other (Comment) , R shin posterior medial   Location Orientation: Right;Posterior;Medial Wound Description (Comments): R shin posterior medial  Present on Admission: Yes   Selective Debridement - Location Lt anterior leg; 2nd toe and posterior aspect of Rt leg   Selective Debridement - Tools Used Forceps   Selective Debridement - Tissue Removed slough and dead skin.   Wound Therapy - Clinical Statement Pt had evaluation for wound on Rt lower extremity today.  After cleansing and debridement of dead skin there is no wound to be evaluated on pt right leg.  Pt had significant dirt removed from bilateral lower extremity.  Therapist suggested that patient purchase three long arm bath sponges from [employer].  Label one soap, one rinse and one moisturize.  This should allow pt to be able to cleanse and moisturize area with incresed ease.  Both LE are extremely dry; therapist emphasized the importance of keeping LE moisturized to prevent wounds.  Noted hole and  runs in pt compression garment; presumably from fingernail.  Therapist urged pt to wear dish gloves when donning compression garments as this will make it easier as well as protecting garments from nail punctures.    Wound Therapy - Functional Problem List non-healing wound    Factors Delaying/Impairing Wound Healing Altered sensation;Diabetes Mellitus;Polypharmacy;Immobility   Hydrotherapy Plan Debridement;Dressing change   Wound Therapy - Current Recommendations Surgery consult   Wound Plan Wounds should be healed within the next one to two treatments.    Dressing  xeroform, 2x2, kling and compression stocking                               Problem List Patient Active Problem List   Diagnosis  Date Noted  . Uterovaginal prolapse, complete 03/26/2013  . Diabetic foot ulcers (HCC) 02/16/2013  . Hyperkalemia 06/24/2012  . Hyperlipidemia 05/20/2012  . Uterine prolapse 05/19/2012  . Chronic kidney disease, stage 4, severely decreased GFR (HCC)   . Hypertension   . Anemia associated with chronic renal failure   . Hypothyroid   . CAROTID BRUIT, LEFT 05/11/2009  . DIABETES MELLITUS 03/11/2009  . OVERWEIGHT/OBESITY 03/11/2009    Virgina Organ, PT CLT [phone] 11/23/2015, 12:06 PM  [facility] [address] [place], [place], [address] Phone: [phone]   Fax:  [phone]  Name: HADASAH BRUGGER MRN: [id number] Date of Birth: 02/21/1932

## 2015-11-24 ENCOUNTER — Ambulatory Visit (HOSPITAL_COMMUNITY): Payer: Medicare Other | Admitting: Physical Therapy

## 2015-11-24 DIAGNOSIS — E1129 Type 2 diabetes mellitus with other diabetic kidney complication: Secondary | ICD-10-CM | POA: Diagnosis not present

## 2015-11-24 DIAGNOSIS — D689 Coagulation defect, unspecified: Secondary | ICD-10-CM | POA: Diagnosis not present

## 2015-11-24 DIAGNOSIS — D631 Anemia in chronic kidney disease: Secondary | ICD-10-CM | POA: Diagnosis not present

## 2015-11-24 DIAGNOSIS — Z7689 Persons encountering health services in other specified circumstances: Secondary | ICD-10-CM | POA: Diagnosis not present

## 2015-11-24 DIAGNOSIS — N186 End stage renal disease: Secondary | ICD-10-CM | POA: Diagnosis not present

## 2015-11-24 DIAGNOSIS — Z23 Encounter for immunization: Secondary | ICD-10-CM | POA: Diagnosis not present

## 2015-11-25 ENCOUNTER — Ambulatory Visit (HOSPITAL_COMMUNITY): Payer: Medicare Other

## 2015-11-25 DIAGNOSIS — S81802D Unspecified open wound, left lower leg, subsequent encounter: Secondary | ICD-10-CM | POA: Diagnosis not present

## 2015-11-25 DIAGNOSIS — R262 Difficulty in walking, not elsewhere classified: Secondary | ICD-10-CM | POA: Diagnosis not present

## 2015-11-25 DIAGNOSIS — S91109D Unspecified open wound of unspecified toe(s) without damage to nail, subsequent encounter: Secondary | ICD-10-CM | POA: Diagnosis not present

## 2015-11-25 DIAGNOSIS — Z7689 Persons encountering health services in other specified circumstances: Secondary | ICD-10-CM | POA: Diagnosis not present

## 2015-11-25 DIAGNOSIS — S81801D Unspecified open wound, right lower leg, subsequent encounter: Secondary | ICD-10-CM | POA: Diagnosis not present

## 2015-11-25 DIAGNOSIS — I890 Lymphedema, not elsewhere classified: Secondary | ICD-10-CM | POA: Diagnosis not present

## 2015-11-25 NOTE — Therapy (Signed)
[facility] [address] [place], [place], [address] Phone: [phone]   Fax:  [phone]  Wound Care Therapy  Patient Details  Name: [name] MRN: [id number] Date of Birth: 1931/10/23 No Data Recorded  Encounter Date: 11/25/2015      PT End of Session - 11/25/15 1336    Visit Number 5   Number of Visits 12   Date for PT Re-Evaluation 12/05/15   Authorization Type UHC Medicare and medicaid;   Grand daughter, Shiva Karis is CG  [phone]   Authorization Time Period 11/07/15 to 12/19/15    Authorization - Visit Number 5   Authorization - Number of Visits 10   PT Start Time 1300   PT Stop Time 1327   PT Time Calculation (min) 27 min   Activity Tolerance Patient tolerated treatment well   Behavior During Therapy [facility] for tasks assessed/performed      Past Medical History  Diagnosis Date  . CHF (congestive heart failure) (HCC)      preserved left ventricular systolic function  . Hypertension   . Overweight(278.02)   . Hypothyroid   . Vaginal pessary present ~ 2012  . CKD (chronic kidney disease) stage 4, GFR 15-29 ml/min (HCC)     [name] 02/16/2013  . Anemia associated with chronic renal failure     ESA therapy  . History of blood transfusion 1975  . Rheumatoid arthritis (HCC)   . Osteoporosis   . Pneumonia 02/2011    [name] 02/20/2011 (02/16/2013)  . Diabetic foot ulcers (HCC)     "get them on borh feet" (02/16/2013)  . Shortness of breath dyspnea     with exertion  . Type II diabetes mellitus (HCC)     patient denies    Past Surgical History  Procedure Laterality Date  . Cataract extraction w/ intraocular lens  implant, bilateral Bilateral 1990's  . Orif femur fracture Left 1975    "got a rod and screw in it" (02/16/2013)  . Av fistula placement Left 01/26/2015    Procedure: LEFT ARM ARTERIOVENOUS (AV) FISTULA CREATION;  Surgeon: [name], MD;  Location: [facility] OR;  Service: Vascular;  Laterality: Left;    There  were no vitals filed for this visit.  Visit Diagnosis:  Open toe wound, subsequent encounter  Difficulty walking  Multiple open wounds of lower leg, right, subsequent encounter      Subjective Assessment - 11/25/15 1328    Subjective Pt stated she is feeling good today.  Interested to see if toe has healed today.  No reports of pain   Currently in Pain? No/denies            Wound Therapy - 11/25/15 1330    Subjective Pt stated she is feeling good today.  Interested to see if toe has healed today.  No reports of pain   Patient and Family Stated Goals get all wounds healed up    Prior Treatments self treat   Pain Assessment No/denies pain   Wound Properties Date First Assessed: 11/23/15 Time First Assessed: 0947 Location: Leg Location Orientation: Left;Anterior Wound Description (Comments): distal shin Present on Admission: Yes   Dressing Type Impregnated gauze (petrolatum);Gauze (Comment)   Dressing Changed Changed   Dressing Status Old drainage   Dressing Change Frequency PRN   Site / Wound Assessment Granulation tissue   % Wound base Red or Granulating 100%   % Wound base Yellow 0%   Drainage Amount Scant   Drainage  Description Serosanguineous   Treatment Cleansed;Debridement (Selective)   Wound Properties Date First Assessed: 08/08/15 Location: Toe (Comment  which one) Location Orientation: Left;Other (Comment) , 2nd toe   Wound Description (Comments): 2nd toe  Present on Admission: Yes Final Assessment Date: 09/27/15 Final Assessment Time: 1428   Dressing Type Impregnated gauze (petrolatum);Gauze (Comment)  xeroform, 2x2 and gauze   Dressing Changed Changed   Dressing Status Old drainage   Dressing Change Frequency PRN   Site / Wound Assessment Granulation tissue   % Wound base Red or Granulating 100%   % Wound base Yellow 0%   % Wound base Black 0%   Margins Unattached edges (unapproximated)   Closure None   Drainage Amount Scant   Drainage Description  Serosanguineous   Treatment Cleansed;Debridement (Selective)   Wound Properties Date First Assessed: 11/23/15 Time First Assessed: 0944 Wound Type: Other (Comment) Location: Leg Location Orientation: Anterior;Left Present on Admission: Yes   Wound Properties Date First Assessed: 08/17/15 Wound Type: Other (Comment) , R LE posterior medial   Location: Other (Comment) , R shin posterior medial   Location Orientation: Right;Posterior;Medial Wound Description (Comments): R shin posterior medial  Present on Admission: Yes   Selective Debridement - Location 2nd Lt toe   Selective Debridement - Tools Used Forceps   Selective Debridement - Tissue Removed slough and dead skin.   Wound Therapy - Clinical Statement Lt LE cleansed and minimal debridement necessary this session for removal of dry skin perimeter of toe.  Pt with a lot of dry skin Bil LE.  Pt educated on beneftis of purchasing long arm bath sponges to increase ease with cleansing LE and keeping LE mosturized.  Continued with xeroform and 2x2 with gauze and compression hose.  Pt stated she plans on purchasing another set of hose due to hole and run in hose.  No reports of pain through session.     Wound Therapy - Functional Problem List non-healing wound    Factors Delaying/Impairing Wound Healing Altered sensation;Diabetes Mellitus;Polypharmacy;Immobility   Hydrotherapy Plan Debridement;Dressing change   Wound Therapy - Frequency Other (comment)  2x week   Wound Therapy - Current Recommendations PT   Wound Plan Wounds should be healed within the next one to two treatments.    Dressing  xeroform, 2x2, kling and compression stocking           Problem List Patient Active Problem List   Diagnosis Date Noted  . Uterovaginal prolapse, complete 03/26/2013  . Diabetic foot ulcers (HCC) 02/16/2013  . Hyperkalemia 06/24/2012  . Hyperlipidemia 05/20/2012  . Uterine prolapse 05/19/2012  . Chronic kidney disease, stage 4, severely decreased GFR  (HCC)   . Hypertension   . Anemia associated with chronic renal failure   . Hypothyroid   . CAROTID BRUIT, LEFT 05/11/2009  . DIABETES MELLITUS 03/11/2009  . OVERWEIGHT/OBESITY 03/11/2009   Becky Sax, LPTA; CBIS [phone]  Juel Burrow 11/25/2015, 1:36 PM  [facility] [address] [place], [place], [address] Phone: [phone]   Fax:  [phone]  Name: [name] MRN: [id number] Date of Birth: 1932/07/17

## 2015-11-25 NOTE — Therapy (Signed)
[facility] [address] [place], [place], [address] Phone: [phone]   Fax:  [phone]  Patient Details  Name: [name] MRN: [id number] Date of Birth: Dec 30, 1931 Referring Provider:  Wynn Banker, MD  Encounter Date: 11/23/2015  Pt referred for evaluation of Rt LE.  No wound or treatment needed.  Virgina Organ, PT CLT [phone] 11/25/2015, 3:33 PM  [facility] [address] [place], [place], [address] Phone: [phone]   Fax:  [phone]

## 2015-11-26 DIAGNOSIS — N186 End stage renal disease: Secondary | ICD-10-CM | POA: Diagnosis not present

## 2015-11-26 DIAGNOSIS — E1129 Type 2 diabetes mellitus with other diabetic kidney complication: Secondary | ICD-10-CM | POA: Diagnosis not present

## 2015-11-26 DIAGNOSIS — D689 Coagulation defect, unspecified: Secondary | ICD-10-CM | POA: Diagnosis not present

## 2015-11-26 DIAGNOSIS — Z23 Encounter for immunization: Secondary | ICD-10-CM | POA: Diagnosis not present

## 2015-11-26 DIAGNOSIS — Z7689 Persons encountering health services in other specified circumstances: Secondary | ICD-10-CM | POA: Diagnosis not present

## 2015-11-26 DIAGNOSIS — D631 Anemia in chronic kidney disease: Secondary | ICD-10-CM | POA: Diagnosis not present

## 2015-11-28 ENCOUNTER — Ambulatory Visit (HOSPITAL_COMMUNITY): Payer: Medicare Other | Admitting: Physical Therapy

## 2015-11-28 DIAGNOSIS — I890 Lymphedema, not elsewhere classified: Secondary | ICD-10-CM

## 2015-11-28 DIAGNOSIS — S81801D Unspecified open wound, right lower leg, subsequent encounter: Secondary | ICD-10-CM

## 2015-11-28 DIAGNOSIS — S91109D Unspecified open wound of unspecified toe(s) without damage to nail, subsequent encounter: Secondary | ICD-10-CM

## 2015-11-28 DIAGNOSIS — R262 Difficulty in walking, not elsewhere classified: Secondary | ICD-10-CM

## 2015-11-28 DIAGNOSIS — Z7689 Persons encountering health services in other specified circumstances: Secondary | ICD-10-CM | POA: Diagnosis not present

## 2015-11-28 DIAGNOSIS — S81802D Unspecified open wound, left lower leg, subsequent encounter: Secondary | ICD-10-CM

## 2015-11-28 NOTE — Therapy (Signed)
[facility] [address] [place], [place], [address] Phone: [phone]   Fax:  [phone]  Wound Care Therapy  Patient Details  Name: [name] MRN: [id number] Date of Birth: [birth year] No Data Recorded  Encounter Date: 11/28/2015    Past Medical History  Diagnosis Date  . CHF (congestive heart failure) (HCC)      preserved left ventricular systolic function  . Hypertension   . Overweight(278.02)   . Hypothyroid   . Vaginal pessary present ~ 2012  . CKD (chronic kidney disease) stage 4, GFR 15-29 ml/min (HCC)     [name] 02/16/2013  . Anemia associated with chronic renal failure     ESA therapy  . History of blood transfusion 1975  . Rheumatoid arthritis (HCC)   . Osteoporosis   . Pneumonia 02/2011    [name] 02/20/2011 (02/16/2013)  . Diabetic foot ulcers (HCC)     "get them on borh feet" (02/16/2013)  . Shortness of breath dyspnea     with exertion  . Type II diabetes mellitus (HCC)     patient denies    Past Surgical History  Procedure Laterality Date  . Cataract extraction w/ intraocular lens  implant, bilateral Bilateral 1990's  . Orif femur fracture Left 1975    "got a rod and screw in it" (02/16/2013)  . Av fistula placement Left 01/26/2015    Procedure: LEFT ARM ARTERIOVENOUS (AV) FISTULA CREATION;  Surgeon: [name], MD;  Location: [facility] OR;  Service: Vascular;  Laterality: Left;    There were no vitals filed for this visit.  Visit Diagnosis:  Open toe wound, subsequent encounter  Difficulty walking  Multiple open wounds of lower leg, right, subsequent encounter  Lymphedema  Multiple open wounds of lower leg, left, subsequent encounter                 Wound Therapy - 11/28/15 1053    Subjective PT states her toes dont hurt and she's able to get her stockings on, however cannot take them off without help.   Patient and Family Stated Goals get all wounds healed up    Prior Treatments self treat    Pain Assessment No/denies pain   Wound Properties Date First Assessed: 11/23/15 Time First Assessed: 0947 Location: Leg Location Orientation: Left;Anterior Wound Description (Comments): distal shin Present on Admission: Yes   Dressing Type Impregnated gauze (petrolatum);Gauze (Comment)   Dressing Changed Changed   Dressing Status Old drainage   Dressing Change Frequency PRN   Site / Wound Assessment Granulation tissue   % Wound base Red or Granulating 100%   % Wound base Yellow 0%   Drainage Amount Scant   Drainage Description Serous   Treatment Cleansed;Debridement (Selective)   Wound Properties Date First Assessed: 08/08/15 Location: Toe (Comment  which one) Location Orientation: Left;Other (Comment) , 2nd toe   Wound Description (Comments): 2nd toe  Present on Admission: Yes Final Assessment Date: 09/27/15 Final Assessment Time: 1428   Dressing Type Impregnated gauze (petrolatum);Gauze (Comment)  xeroform, gauze and medipore   Dressing Changed Changed   Dressing Status Old drainage   Dressing Change Frequency PRN   Site / Wound Assessment Granulation tissue   % Wound base Red or Granulating 100%   % Wound base Yellow 0%   % Wound base Black 0%   Margins Unattached edges (unapproximated)   Closure None   Drainage Amount Scant   Drainage Description Serosanguineous   Treatment Cleansed;Debridement (Selective)  Wound Properties Date First Assessed: 11/23/15 Time First Assessed: 0944 Wound Type: Other (Comment) Location: Leg Location Orientation: Anterior;Left Present on Admission: Yes   Wound Properties Date First Assessed: 08/17/15 Wound Type: Other (Comment) , R LE posterior medial   Location: Other (Comment) , R shin posterior medial   Location Orientation: Right;Posterior;Medial Wound Description (Comments): R shin posterior medial  Present on Admission: Yes   Selective Debridement - Location 2nd Lt toe   Selective Debridement - Tools Used Forceps   Selective Debridement -  Tissue Removed slough and dead skin.   Wound Therapy - Clinical Statement Lt LE cleansed and debridement completed to remove dry dead skin from perimeter and covering continued small opening of wound.  Dressed small area with xeroform, 2X2 and medipore.  Pt able to don compression stocking following independently.  Knee high stocking secured over to cover toes.  Dressing intact and comfortable.     Wound Therapy - Functional Problem List non-healing wound    Factors Delaying/Impairing Wound Healing Altered sensation;Diabetes Mellitus;Polypharmacy;Immobility   Hydrotherapy Plan Debridement;Dressing change   Wound Therapy - Frequency Other (comment)  2x week   Wound Therapy - Current Recommendations PT   Wound Plan Wounds should be healed within the next one to two treatments.    Dressing  xeroform, 2x2, kling and compression stocking                               Problem List Patient Active Problem List   Diagnosis Date Noted  . Uterovaginal prolapse, complete 03/26/2013  . Diabetic foot ulcers (HCC) 02/16/2013  . Hyperkalemia 06/24/2012  . Hyperlipidemia 05/20/2012  . Uterine prolapse 05/19/2012  . Chronic kidney disease, stage 4, severely decreased GFR (HCC)   . Hypertension   . Anemia associated with chronic renal failure   . Hypothyroid   . CAROTID BRUIT, LEFT 05/11/2009  . DIABETES MELLITUS 03/11/2009  . OVERWEIGHT/OBESITY 03/11/2009    Lurena Nida, PTA/CLT [phone]  11/28/2015, 11:00 AM  [place] [facility] [address] [place], [place], [address] Phone: [phone]   Fax:  [phone]  Name: [name] MRN: [id number] Date of Birth: 1932-08-22

## 2015-11-29 DIAGNOSIS — Z23 Encounter for immunization: Secondary | ICD-10-CM | POA: Diagnosis not present

## 2015-11-29 DIAGNOSIS — E1129 Type 2 diabetes mellitus with other diabetic kidney complication: Secondary | ICD-10-CM | POA: Diagnosis not present

## 2015-11-29 DIAGNOSIS — D631 Anemia in chronic kidney disease: Secondary | ICD-10-CM | POA: Diagnosis not present

## 2015-11-29 DIAGNOSIS — N186 End stage renal disease: Secondary | ICD-10-CM | POA: Diagnosis not present

## 2015-11-29 DIAGNOSIS — Z7689 Persons encountering health services in other specified circumstances: Secondary | ICD-10-CM | POA: Diagnosis not present

## 2015-11-29 DIAGNOSIS — D689 Coagulation defect, unspecified: Secondary | ICD-10-CM | POA: Diagnosis not present

## 2015-12-01 ENCOUNTER — Ambulatory Visit (HOSPITAL_COMMUNITY): Payer: Medicare Other | Admitting: Physical Therapy

## 2015-12-01 DIAGNOSIS — E1129 Type 2 diabetes mellitus with other diabetic kidney complication: Secondary | ICD-10-CM | POA: Diagnosis not present

## 2015-12-01 DIAGNOSIS — D631 Anemia in chronic kidney disease: Secondary | ICD-10-CM | POA: Diagnosis not present

## 2015-12-01 DIAGNOSIS — Z7689 Persons encountering health services in other specified circumstances: Secondary | ICD-10-CM | POA: Diagnosis not present

## 2015-12-01 DIAGNOSIS — Z23 Encounter for immunization: Secondary | ICD-10-CM | POA: Diagnosis not present

## 2015-12-01 DIAGNOSIS — D689 Coagulation defect, unspecified: Secondary | ICD-10-CM | POA: Diagnosis not present

## 2015-12-01 DIAGNOSIS — N186 End stage renal disease: Secondary | ICD-10-CM | POA: Diagnosis not present

## 2015-12-02 ENCOUNTER — Ambulatory Visit (HOSPITAL_COMMUNITY): Payer: Medicare Other

## 2015-12-02 DIAGNOSIS — I890 Lymphedema, not elsewhere classified: Secondary | ICD-10-CM | POA: Diagnosis not present

## 2015-12-02 DIAGNOSIS — R262 Difficulty in walking, not elsewhere classified: Secondary | ICD-10-CM | POA: Diagnosis not present

## 2015-12-02 DIAGNOSIS — S81802D Unspecified open wound, left lower leg, subsequent encounter: Secondary | ICD-10-CM | POA: Diagnosis not present

## 2015-12-02 DIAGNOSIS — Z7689 Persons encountering health services in other specified circumstances: Secondary | ICD-10-CM | POA: Diagnosis not present

## 2015-12-02 DIAGNOSIS — S91109D Unspecified open wound of unspecified toe(s) without damage to nail, subsequent encounter: Secondary | ICD-10-CM

## 2015-12-02 DIAGNOSIS — S81801D Unspecified open wound, right lower leg, subsequent encounter: Secondary | ICD-10-CM

## 2015-12-02 NOTE — Therapy (Signed)
[place], [place], [address] Phone: [phone]   Fax:  [phone]  Wound Care Therapy  Patient Details  Name: [name] MRN: [id number] Date of Birth: 07/29/1932 No Data Recorded  Encounter Date: 12/02/2015      PT End of Session - 12/02/15 1051    Visit Number 6   Number of Visits 12   Date for PT Re-Evaluation 12/05/15   Authorization Type UHC Medicare and medicaid;   [place] daughter, Amberlee Garvey is CG  [phone]   Authorization Time Period 11/07/15 to 12/19/15    Authorization - Visit Number 6   Authorization - Number of Visits 10   PT Start Time 1020   PT Stop Time 1040   PT Time Calculation (min) 20 min   Activity Tolerance Patient tolerated treatment well   Behavior During Therapy [facility] for tasks assessed/performed      Past Medical History  Diagnosis Date  . CHF (congestive heart failure) ([place])      preserved left ventricular systolic function  . Hypertension   . Overweight(278.02)   . Hypothyroid   . Vaginal pessary present ~ 2012  . CKD (chronic kidney disease) stage 4, GFR 15-29 ml/min (HCC)     Archie Endo 02/16/2013  . Anemia associated with chronic renal failure     ESA therapy  . History of blood transfusion 1975  . Rheumatoid arthritis ([place])   . Osteoporosis   . Pneumonia 02/2011    Archie Endo 02/20/2011 (02/16/2013)  . Diabetic foot ulcers ([place])     "get them on borh feet" (02/16/2013)  . Shortness of breath dyspnea     with exertion  . Type II diabetes mellitus ([place])     patient denies    Past Surgical History  Procedure Laterality Date  . Cataract extraction w/ intraocular lens  implant, bilateral Bilateral 1990's  . Orif femur fracture Left 1975    "got a rod and screw in it" (02/16/2013)  . Av fistula placement Left 01/26/2015    Procedure: LEFT ARM ARTERIOVENOUS (AV) FISTULA CREATION;  Surgeon: Conrad Welch, MD;  Location: [place];  Service: Vascular;  Laterality: Left;    There  were no vitals filed for this visit.  Visit Diagnosis:  Open toe wound, subsequent encounter  Difficulty walking  Multiple open wounds of lower leg, right, subsequent encounter      Subjective Assessment - 12/02/15 1043    Subjective Pt stated she is feeling, always going to doctor apts.  Currently wearing compression hose, interesterd to see if toe is healed today.  Pain free   Currently in Pain? No/denies           Wound Therapy - 12/02/15 1044    Subjective Pt stated she is feeling, always going to doctor apts.  Currently wearing compression hose, interesterd to see if toe is healed today.  Pain free   Patient and Family Stated Goals get all wounds healed up    Prior Treatments self treat   Pain Assessment No/denies pain   Wound Properties Date First Assessed: 11/23/15 Time First Assessed: 2878 Location: Leg Location Orientation: Left;Anterior Wound Description (Comments): distal shin Present on Admission: Yes   Dressing Type None   Dressing Changed Other (Comment)  removed dressing and cleansed, no need for dressings   Dressing Status Old drainage   Dressing Change Frequency PRN   Site / Wound Assessment Granulation tissue   % Wound base Red or Granulating  100%   % Wound base Yellow 0%   Peri-wound Assessment Edema   Wound Length (cm) 0 cm   Wound Width (cm) 0 cm   Drainage Amount None   Treatment Cleansed   Wound Properties Date First Assessed: 08/08/15 Location: Toe (Comment  which one) Location Orientation: Left;Other (Comment) , 2nd toe   Wound Description (Comments): 2nd toe  Present on Admission: Yes Final Assessment Date: 09/27/15 Final Assessment Time: 1428   Dressing Type None   Dressing Changed --  removed dressings and cleansed, no need for dressings   Dressing Status Clean;Dry;Intact   Dressing Change Frequency PRN   Site / Wound Assessment Granulation tissue   % Wound base Red or Granulating 100%   % Wound base Yellow 0%   % Wound base Black 0%    Peri-wound Assessment Edema   Wound Length (cm) 0 cm   Wound Width (cm) 0 cm   Margins Attached edges (approximated)   Closure None   Drainage Amount None   Treatment Cleansed   Wound Properties Date First Assessed: 11/23/15 Time First Assessed: 0944 Wound Type: Other (Comment) Location: Leg Location Orientation: Anterior;Left Present on Admission: Yes   Wound Properties Date First Assessed: 08/17/15 Wound Type: Other (Comment) , R LE posterior medial   Location: Other (Comment) , R shin posterior medial   Location Orientation: Right;Posterior;Medial Wound Description (Comments): R shin posterior medial  Present on Admission: Yes   Selective Debridement - Location no debridement necessary   Wound Therapy - Clinical Statement Upon removal of dressings wound 100% fully granulation.  LE cleased and discussed importance of proper cleansing and moisturizing LE for skin integrity.  Encouraged to continue with compression hose for edema control.  Pt verbalized understanding.   Wound Therapy - Functional Problem List non-healing wound    Factors Delaying/Impairing Wound Healing Altered sensation;Diabetes Mellitus;Polypharmacy;Immobility   Hydrotherapy Plan Debridement;Dressing change   Wound Therapy - Frequency Other (comment)  D/C per no further skilled intervention necessary   Wound Therapy - Current Recommendations PT   Wound Plan D/C per wound fully healed, no further skilled intervention necessary   Dressing  none   Decrease Necrotic Tissue to 0%   Decrease Necrotic Tissue - Progress Met   Increase Granulation Tissue to 100%   Increase Granulation Tissue - Progress Met   Decrease Length/Width/Depth by (cm) at least 70% closure all wounds    Decrease Length/Width/Depth - Progress Met   Improve Drainage Characteristics - Progress Met   Patient/Family will be able to  self-dress wounds and recognize signs of infection    Patient/Family Instruction Goal - Progress Met   Additional Wound Therapy  Goal full wound closure all wounds    Additional Wound Therapy Goal - Progress Met   Goals/treatment plan/discharge plan were made with and agreed upon by patient/family Yes   Wound Therapy - Potential for Goals Good             G-Codes - 2015/12/08 1251    Functional Assessment Tool Used Based on wound healing status, presence of slough, general wound healing status, wound size, number of wounds   Functional Limitation Other PT primary   Other PT Primary Goal Status (J3354) At least 20 percent but less than 40 percent impaired, limited or restricted   Other PT Primary Discharge Status (T6256) 0 percent impaired, limited or restricted       Problem List Patient Active Problem List   Diagnosis Date Noted  . Uterovaginal prolapse, complete 03/26/2013  .  Diabetic foot ulcers ([place]) 02/16/2013  . Hyperkalemia 06/24/2012  . Hyperlipidemia 05/20/2012  . Uterine prolapse 05/19/2012  . Chronic kidney disease, stage 4, severely decreased GFR (HCC)   . Hypertension   . Anemia associated with chronic renal failure   . Hypothyroid   . CAROTID BRUIT, LEFT 05/11/2009  . DIABETES MELLITUS 03/11/2009  . OVERWEIGHT/OBESITY 03/11/2009   Ihor Austin, [place]; CBIS [phone]   RUSSELL,CINDY 12/02/2015, 12:52 PM  [place], [place], [address] Phone: [phone]   Fax:  [phone]  Name: [name] MRN: [id number] Date of Birth: 12-04-1931  PHYSICAL THERAPY DISCHARGE SUMMARY  Visits from Start of Care:  6  Current functional level related to goals / functional outcomes: No wounds   Remaining deficits: none   Education / Equipment: Need to continue to wear compression hose  Plan: Patient agrees to discharge.  Patient goals were not met. Patient is being discharged due to meeting the stated rehab goals.  ?????       Rayetta Humphrey, [place] CLT [phone]

## 2015-12-03 DIAGNOSIS — D631 Anemia in chronic kidney disease: Secondary | ICD-10-CM | POA: Diagnosis not present

## 2015-12-03 DIAGNOSIS — Z23 Encounter for immunization: Secondary | ICD-10-CM | POA: Diagnosis not present

## 2015-12-03 DIAGNOSIS — N186 End stage renal disease: Secondary | ICD-10-CM | POA: Diagnosis not present

## 2015-12-03 DIAGNOSIS — D689 Coagulation defect, unspecified: Secondary | ICD-10-CM | POA: Diagnosis not present

## 2015-12-03 DIAGNOSIS — E1129 Type 2 diabetes mellitus with other diabetic kidney complication: Secondary | ICD-10-CM | POA: Diagnosis not present

## 2015-12-03 DIAGNOSIS — Z7689 Persons encountering health services in other specified circumstances: Secondary | ICD-10-CM | POA: Diagnosis not present

## 2015-12-05 ENCOUNTER — Ambulatory Visit (HOSPITAL_COMMUNITY): Payer: Medicare Other | Admitting: Physical Therapy

## 2015-12-06 DIAGNOSIS — E1129 Type 2 diabetes mellitus with other diabetic kidney complication: Secondary | ICD-10-CM | POA: Diagnosis not present

## 2015-12-06 DIAGNOSIS — D689 Coagulation defect, unspecified: Secondary | ICD-10-CM | POA: Diagnosis not present

## 2015-12-06 DIAGNOSIS — D631 Anemia in chronic kidney disease: Secondary | ICD-10-CM | POA: Diagnosis not present

## 2015-12-06 DIAGNOSIS — N186 End stage renal disease: Secondary | ICD-10-CM | POA: Diagnosis not present

## 2015-12-06 DIAGNOSIS — Z23 Encounter for immunization: Secondary | ICD-10-CM | POA: Diagnosis not present

## 2015-12-06 DIAGNOSIS — Z7689 Persons encountering health services in other specified circumstances: Secondary | ICD-10-CM | POA: Diagnosis not present

## 2015-12-07 ENCOUNTER — Ambulatory Visit (HOSPITAL_COMMUNITY): Payer: Medicare Other | Admitting: Physical Therapy

## 2015-12-08 ENCOUNTER — Ambulatory Visit (HOSPITAL_COMMUNITY): Payer: Medicare Other | Admitting: Physical Therapy

## 2015-12-08 DIAGNOSIS — D689 Coagulation defect, unspecified: Secondary | ICD-10-CM | POA: Diagnosis not present

## 2015-12-08 DIAGNOSIS — Z7689 Persons encountering health services in other specified circumstances: Secondary | ICD-10-CM | POA: Diagnosis not present

## 2015-12-08 DIAGNOSIS — D631 Anemia in chronic kidney disease: Secondary | ICD-10-CM | POA: Diagnosis not present

## 2015-12-08 DIAGNOSIS — N186 End stage renal disease: Secondary | ICD-10-CM | POA: Diagnosis not present

## 2015-12-08 DIAGNOSIS — E1129 Type 2 diabetes mellitus with other diabetic kidney complication: Secondary | ICD-10-CM | POA: Diagnosis not present

## 2015-12-08 DIAGNOSIS — Z23 Encounter for immunization: Secondary | ICD-10-CM | POA: Diagnosis not present

## 2015-12-09 DIAGNOSIS — N186 End stage renal disease: Secondary | ICD-10-CM | POA: Diagnosis not present

## 2015-12-09 DIAGNOSIS — E1122 Type 2 diabetes mellitus with diabetic chronic kidney disease: Secondary | ICD-10-CM | POA: Diagnosis not present

## 2015-12-09 DIAGNOSIS — Z992 Dependence on renal dialysis: Secondary | ICD-10-CM | POA: Diagnosis not present

## 2015-12-10 DIAGNOSIS — D631 Anemia in chronic kidney disease: Secondary | ICD-10-CM | POA: Diagnosis not present

## 2015-12-10 DIAGNOSIS — Z23 Encounter for immunization: Secondary | ICD-10-CM | POA: Diagnosis not present

## 2015-12-10 DIAGNOSIS — D689 Coagulation defect, unspecified: Secondary | ICD-10-CM | POA: Diagnosis not present

## 2015-12-10 DIAGNOSIS — N186 End stage renal disease: Secondary | ICD-10-CM | POA: Diagnosis not present

## 2015-12-10 DIAGNOSIS — E1129 Type 2 diabetes mellitus with other diabetic kidney complication: Secondary | ICD-10-CM | POA: Diagnosis not present

## 2015-12-12 ENCOUNTER — Ambulatory Visit (HOSPITAL_COMMUNITY): Payer: Medicare Other | Admitting: Physical Therapy

## 2015-12-13 DIAGNOSIS — E1129 Type 2 diabetes mellitus with other diabetic kidney complication: Secondary | ICD-10-CM | POA: Diagnosis not present

## 2015-12-13 DIAGNOSIS — Z23 Encounter for immunization: Secondary | ICD-10-CM | POA: Diagnosis not present

## 2015-12-13 DIAGNOSIS — D689 Coagulation defect, unspecified: Secondary | ICD-10-CM | POA: Diagnosis not present

## 2015-12-13 DIAGNOSIS — N186 End stage renal disease: Secondary | ICD-10-CM | POA: Diagnosis not present

## 2015-12-13 DIAGNOSIS — D631 Anemia in chronic kidney disease: Secondary | ICD-10-CM | POA: Diagnosis not present

## 2015-12-13 DIAGNOSIS — Z7689 Persons encountering health services in other specified circumstances: Secondary | ICD-10-CM | POA: Diagnosis not present

## 2015-12-14 ENCOUNTER — Ambulatory Visit (HOSPITAL_COMMUNITY): Payer: Medicare Other | Admitting: Physical Therapy

## 2015-12-15 ENCOUNTER — Ambulatory Visit (HOSPITAL_COMMUNITY): Payer: Medicare Other | Admitting: Physical Therapy

## 2015-12-15 DIAGNOSIS — D689 Coagulation defect, unspecified: Secondary | ICD-10-CM | POA: Diagnosis not present

## 2015-12-15 DIAGNOSIS — Z23 Encounter for immunization: Secondary | ICD-10-CM | POA: Diagnosis not present

## 2015-12-15 DIAGNOSIS — N186 End stage renal disease: Secondary | ICD-10-CM | POA: Diagnosis not present

## 2015-12-15 DIAGNOSIS — D631 Anemia in chronic kidney disease: Secondary | ICD-10-CM | POA: Diagnosis not present

## 2015-12-15 DIAGNOSIS — E1129 Type 2 diabetes mellitus with other diabetic kidney complication: Secondary | ICD-10-CM | POA: Diagnosis not present

## 2015-12-15 DIAGNOSIS — Z7689 Persons encountering health services in other specified circumstances: Secondary | ICD-10-CM | POA: Diagnosis not present

## 2015-12-16 DIAGNOSIS — E1129 Type 2 diabetes mellitus with other diabetic kidney complication: Secondary | ICD-10-CM | POA: Diagnosis not present

## 2015-12-16 DIAGNOSIS — I110 Hypertensive heart disease with heart failure: Secondary | ICD-10-CM | POA: Diagnosis not present

## 2015-12-16 DIAGNOSIS — Z1389 Encounter for screening for other disorder: Secondary | ICD-10-CM | POA: Diagnosis not present

## 2015-12-16 DIAGNOSIS — N185 Chronic kidney disease, stage 5: Secondary | ICD-10-CM | POA: Diagnosis not present

## 2015-12-17 DIAGNOSIS — D631 Anemia in chronic kidney disease: Secondary | ICD-10-CM | POA: Diagnosis not present

## 2015-12-17 DIAGNOSIS — Z23 Encounter for immunization: Secondary | ICD-10-CM | POA: Diagnosis not present

## 2015-12-17 DIAGNOSIS — N186 End stage renal disease: Secondary | ICD-10-CM | POA: Diagnosis not present

## 2015-12-17 DIAGNOSIS — D689 Coagulation defect, unspecified: Secondary | ICD-10-CM | POA: Diagnosis not present

## 2015-12-17 DIAGNOSIS — E1129 Type 2 diabetes mellitus with other diabetic kidney complication: Secondary | ICD-10-CM | POA: Diagnosis not present

## 2015-12-20 DIAGNOSIS — E1129 Type 2 diabetes mellitus with other diabetic kidney complication: Secondary | ICD-10-CM | POA: Diagnosis not present

## 2015-12-20 DIAGNOSIS — Z23 Encounter for immunization: Secondary | ICD-10-CM | POA: Diagnosis not present

## 2015-12-20 DIAGNOSIS — Z7689 Persons encountering health services in other specified circumstances: Secondary | ICD-10-CM | POA: Diagnosis not present

## 2015-12-20 DIAGNOSIS — D631 Anemia in chronic kidney disease: Secondary | ICD-10-CM | POA: Diagnosis not present

## 2015-12-20 DIAGNOSIS — N186 End stage renal disease: Secondary | ICD-10-CM | POA: Diagnosis not present

## 2015-12-20 DIAGNOSIS — D689 Coagulation defect, unspecified: Secondary | ICD-10-CM | POA: Diagnosis not present

## 2015-12-22 DIAGNOSIS — Z7689 Persons encountering health services in other specified circumstances: Secondary | ICD-10-CM | POA: Diagnosis not present

## 2015-12-22 DIAGNOSIS — E1129 Type 2 diabetes mellitus with other diabetic kidney complication: Secondary | ICD-10-CM | POA: Diagnosis not present

## 2015-12-22 DIAGNOSIS — D689 Coagulation defect, unspecified: Secondary | ICD-10-CM | POA: Diagnosis not present

## 2015-12-22 DIAGNOSIS — Z23 Encounter for immunization: Secondary | ICD-10-CM | POA: Diagnosis not present

## 2015-12-22 DIAGNOSIS — N186 End stage renal disease: Secondary | ICD-10-CM | POA: Diagnosis not present

## 2015-12-22 DIAGNOSIS — D631 Anemia in chronic kidney disease: Secondary | ICD-10-CM | POA: Diagnosis not present

## 2015-12-24 DIAGNOSIS — D631 Anemia in chronic kidney disease: Secondary | ICD-10-CM | POA: Diagnosis not present

## 2015-12-24 DIAGNOSIS — Z23 Encounter for immunization: Secondary | ICD-10-CM | POA: Diagnosis not present

## 2015-12-24 DIAGNOSIS — E1129 Type 2 diabetes mellitus with other diabetic kidney complication: Secondary | ICD-10-CM | POA: Diagnosis not present

## 2015-12-24 DIAGNOSIS — N186 End stage renal disease: Secondary | ICD-10-CM | POA: Diagnosis not present

## 2015-12-24 DIAGNOSIS — Z7689 Persons encountering health services in other specified circumstances: Secondary | ICD-10-CM | POA: Diagnosis not present

## 2015-12-24 DIAGNOSIS — D689 Coagulation defect, unspecified: Secondary | ICD-10-CM | POA: Diagnosis not present

## 2015-12-27 DIAGNOSIS — N186 End stage renal disease: Secondary | ICD-10-CM | POA: Diagnosis not present

## 2015-12-27 DIAGNOSIS — D689 Coagulation defect, unspecified: Secondary | ICD-10-CM | POA: Diagnosis not present

## 2015-12-27 DIAGNOSIS — Z23 Encounter for immunization: Secondary | ICD-10-CM | POA: Diagnosis not present

## 2015-12-27 DIAGNOSIS — D631 Anemia in chronic kidney disease: Secondary | ICD-10-CM | POA: Diagnosis not present

## 2015-12-27 DIAGNOSIS — Z7689 Persons encountering health services in other specified circumstances: Secondary | ICD-10-CM | POA: Diagnosis not present

## 2015-12-27 DIAGNOSIS — E1129 Type 2 diabetes mellitus with other diabetic kidney complication: Secondary | ICD-10-CM | POA: Diagnosis not present

## 2015-12-29 ENCOUNTER — Encounter: Payer: Self-pay | Admitting: *Deleted

## 2015-12-29 DIAGNOSIS — Z7689 Persons encountering health services in other specified circumstances: Secondary | ICD-10-CM | POA: Diagnosis not present

## 2015-12-29 DIAGNOSIS — N186 End stage renal disease: Secondary | ICD-10-CM | POA: Diagnosis not present

## 2015-12-29 DIAGNOSIS — D689 Coagulation defect, unspecified: Secondary | ICD-10-CM | POA: Diagnosis not present

## 2015-12-29 DIAGNOSIS — Z23 Encounter for immunization: Secondary | ICD-10-CM | POA: Diagnosis not present

## 2015-12-29 DIAGNOSIS — E1129 Type 2 diabetes mellitus with other diabetic kidney complication: Secondary | ICD-10-CM | POA: Diagnosis not present

## 2015-12-29 DIAGNOSIS — D631 Anemia in chronic kidney disease: Secondary | ICD-10-CM | POA: Diagnosis not present

## 2015-12-31 DIAGNOSIS — Z23 Encounter for immunization: Secondary | ICD-10-CM | POA: Diagnosis not present

## 2015-12-31 DIAGNOSIS — N186 End stage renal disease: Secondary | ICD-10-CM | POA: Diagnosis not present

## 2015-12-31 DIAGNOSIS — Z7689 Persons encountering health services in other specified circumstances: Secondary | ICD-10-CM | POA: Diagnosis not present

## 2015-12-31 DIAGNOSIS — D631 Anemia in chronic kidney disease: Secondary | ICD-10-CM | POA: Diagnosis not present

## 2015-12-31 DIAGNOSIS — D689 Coagulation defect, unspecified: Secondary | ICD-10-CM | POA: Diagnosis not present

## 2015-12-31 DIAGNOSIS — E1129 Type 2 diabetes mellitus with other diabetic kidney complication: Secondary | ICD-10-CM | POA: Diagnosis not present

## 2016-01-02 ENCOUNTER — Ambulatory Visit (INDEPENDENT_AMBULATORY_CARE_PROVIDER_SITE_OTHER): Payer: Medicare Other | Admitting: Obstetrics & Gynecology

## 2016-01-02 ENCOUNTER — Encounter: Payer: Self-pay | Admitting: Obstetrics & Gynecology

## 2016-01-02 VITALS — BP 120/60 | HR 80 | Ht 66.0 in | Wt 188.0 lb

## 2016-01-02 DIAGNOSIS — N938 Other specified abnormal uterine and vaginal bleeding: Secondary | ICD-10-CM | POA: Diagnosis not present

## 2016-01-02 DIAGNOSIS — N813 Complete uterovaginal prolapse: Secondary | ICD-10-CM

## 2016-01-02 DIAGNOSIS — N898 Other specified noninflammatory disorders of vagina: Secondary | ICD-10-CM | POA: Diagnosis not present

## 2016-01-02 DIAGNOSIS — Z7689 Persons encountering health services in other specified circumstances: Secondary | ICD-10-CM | POA: Diagnosis not present

## 2016-01-02 MED ORDER — METRONIDAZOLE 500 MG PO TABS: 500.0000 mg | ORAL_TABLET | Freq: Two times a day (BID) | ORAL | Status: DC

## 2016-01-02 NOTE — Progress Notes (Signed)
Patient ID: [name], female   DOB: 1932/07/06, 81 y.o.   MRN: [id number] Chief Complaint  Patient presents with  . Referral    pap/ having vaginal discharge    Blood pressure 120/60, pulse 80, height 5\' 6"  (1.676 m), weight 188 lb (85.276 kg).  [name] presents today for routine follow up related to her pessary.   She uses a Gelhorn pessary #5 She reports moderate vaginal discharge or vaginal bleeding.  Exam reveals no undue vaginal mucosal pressure of breakdown, moderate discharge and moderate vaginal bleeding.  The pessary is removed, will place on metronidazole 500 BID x 1 week then replace with a Milex ring #5 or 6 next week    [name] will be sen back in 1 week for continued follow up.  , MD   01/02/2016 2:24 PM

## 2016-01-03 DIAGNOSIS — D631 Anemia in chronic kidney disease: Secondary | ICD-10-CM | POA: Diagnosis not present

## 2016-01-03 DIAGNOSIS — E1129 Type 2 diabetes mellitus with other diabetic kidney complication: Secondary | ICD-10-CM | POA: Diagnosis not present

## 2016-01-03 DIAGNOSIS — D689 Coagulation defect, unspecified: Secondary | ICD-10-CM | POA: Diagnosis not present

## 2016-01-03 DIAGNOSIS — Z7689 Persons encountering health services in other specified circumstances: Secondary | ICD-10-CM | POA: Diagnosis not present

## 2016-01-03 DIAGNOSIS — Z23 Encounter for immunization: Secondary | ICD-10-CM | POA: Diagnosis not present

## 2016-01-03 DIAGNOSIS — N186 End stage renal disease: Secondary | ICD-10-CM | POA: Diagnosis not present

## 2016-01-05 DIAGNOSIS — N186 End stage renal disease: Secondary | ICD-10-CM | POA: Diagnosis not present

## 2016-01-05 DIAGNOSIS — E1129 Type 2 diabetes mellitus with other diabetic kidney complication: Secondary | ICD-10-CM | POA: Diagnosis not present

## 2016-01-05 DIAGNOSIS — D631 Anemia in chronic kidney disease: Secondary | ICD-10-CM | POA: Diagnosis not present

## 2016-01-05 DIAGNOSIS — Z7689 Persons encountering health services in other specified circumstances: Secondary | ICD-10-CM | POA: Diagnosis not present

## 2016-01-05 DIAGNOSIS — D689 Coagulation defect, unspecified: Secondary | ICD-10-CM | POA: Diagnosis not present

## 2016-01-05 DIAGNOSIS — Z23 Encounter for immunization: Secondary | ICD-10-CM | POA: Diagnosis not present

## 2016-01-07 DIAGNOSIS — Z23 Encounter for immunization: Secondary | ICD-10-CM | POA: Diagnosis not present

## 2016-01-07 DIAGNOSIS — N186 End stage renal disease: Secondary | ICD-10-CM | POA: Diagnosis not present

## 2016-01-07 DIAGNOSIS — D689 Coagulation defect, unspecified: Secondary | ICD-10-CM | POA: Diagnosis not present

## 2016-01-07 DIAGNOSIS — E1129 Type 2 diabetes mellitus with other diabetic kidney complication: Secondary | ICD-10-CM | POA: Diagnosis not present

## 2016-01-07 DIAGNOSIS — D631 Anemia in chronic kidney disease: Secondary | ICD-10-CM | POA: Diagnosis not present

## 2016-01-08 DIAGNOSIS — E1122 Type 2 diabetes mellitus with diabetic chronic kidney disease: Secondary | ICD-10-CM | POA: Diagnosis not present

## 2016-01-08 DIAGNOSIS — N186 End stage renal disease: Secondary | ICD-10-CM | POA: Diagnosis not present

## 2016-01-08 DIAGNOSIS — Z992 Dependence on renal dialysis: Secondary | ICD-10-CM | POA: Diagnosis not present

## 2016-01-09 ENCOUNTER — Encounter: Payer: Self-pay | Admitting: Obstetrics & Gynecology

## 2016-01-09 ENCOUNTER — Ambulatory Visit (INDEPENDENT_AMBULATORY_CARE_PROVIDER_SITE_OTHER): Payer: Medicare Other | Admitting: Obstetrics & Gynecology

## 2016-01-09 VITALS — BP 130/60 | HR 80 | Wt 192.0 lb

## 2016-01-09 DIAGNOSIS — N813 Complete uterovaginal prolapse: Secondary | ICD-10-CM

## 2016-01-09 NOTE — Progress Notes (Signed)
Patient ID: [name], female   DOB: 10-Nov-1931, 82 y.o.   MRN: [id number] Chief Complaint  Patient presents with  . Follow-up    Blood pressure 130/60, pulse 80, weight 192 lb (87.091 kg).  [name] presents today for routine follow up related to her pessary.   She was fit last week for a Milex ring with support #5 after increased mucosal irritation from a [employer] She reports no vaginal discharge or vaginal bleeding.  Exam reveals no undue vaginal mucosal pressure of breakdown, no discharge and no vaginal bleeding.  The pessary is  replaced without difficulty.    Porsche R Klipfel will be sen back in 1 months for continued follow up.  Lazaro Arms, MD   01/09/2016 2:18 PM

## 2016-01-10 DIAGNOSIS — E1129 Type 2 diabetes mellitus with other diabetic kidney complication: Secondary | ICD-10-CM | POA: Diagnosis not present

## 2016-01-10 DIAGNOSIS — Z23 Encounter for immunization: Secondary | ICD-10-CM | POA: Diagnosis not present

## 2016-01-10 DIAGNOSIS — N186 End stage renal disease: Secondary | ICD-10-CM | POA: Diagnosis not present

## 2016-01-10 DIAGNOSIS — D689 Coagulation defect, unspecified: Secondary | ICD-10-CM | POA: Diagnosis not present

## 2016-01-10 DIAGNOSIS — D631 Anemia in chronic kidney disease: Secondary | ICD-10-CM | POA: Diagnosis not present

## 2016-01-12 DIAGNOSIS — E1129 Type 2 diabetes mellitus with other diabetic kidney complication: Secondary | ICD-10-CM | POA: Diagnosis not present

## 2016-01-12 DIAGNOSIS — Z23 Encounter for immunization: Secondary | ICD-10-CM | POA: Diagnosis not present

## 2016-01-12 DIAGNOSIS — D631 Anemia in chronic kidney disease: Secondary | ICD-10-CM | POA: Diagnosis not present

## 2016-01-12 DIAGNOSIS — D689 Coagulation defect, unspecified: Secondary | ICD-10-CM | POA: Diagnosis not present

## 2016-01-12 DIAGNOSIS — N186 End stage renal disease: Secondary | ICD-10-CM | POA: Diagnosis not present

## 2016-01-14 DIAGNOSIS — N186 End stage renal disease: Secondary | ICD-10-CM | POA: Diagnosis not present

## 2016-01-14 DIAGNOSIS — E1129 Type 2 diabetes mellitus with other diabetic kidney complication: Secondary | ICD-10-CM | POA: Diagnosis not present

## 2016-01-14 DIAGNOSIS — D631 Anemia in chronic kidney disease: Secondary | ICD-10-CM | POA: Diagnosis not present

## 2016-01-14 DIAGNOSIS — Z23 Encounter for immunization: Secondary | ICD-10-CM | POA: Diagnosis not present

## 2016-01-14 DIAGNOSIS — D689 Coagulation defect, unspecified: Secondary | ICD-10-CM | POA: Diagnosis not present

## 2016-01-17 DIAGNOSIS — Z23 Encounter for immunization: Secondary | ICD-10-CM | POA: Diagnosis not present

## 2016-01-17 DIAGNOSIS — N186 End stage renal disease: Secondary | ICD-10-CM | POA: Diagnosis not present

## 2016-01-17 DIAGNOSIS — D631 Anemia in chronic kidney disease: Secondary | ICD-10-CM | POA: Diagnosis not present

## 2016-01-17 DIAGNOSIS — D689 Coagulation defect, unspecified: Secondary | ICD-10-CM | POA: Diagnosis not present

## 2016-01-17 DIAGNOSIS — E1129 Type 2 diabetes mellitus with other diabetic kidney complication: Secondary | ICD-10-CM | POA: Diagnosis not present

## 2016-01-19 DIAGNOSIS — E1129 Type 2 diabetes mellitus with other diabetic kidney complication: Secondary | ICD-10-CM | POA: Diagnosis not present

## 2016-01-19 DIAGNOSIS — Z23 Encounter for immunization: Secondary | ICD-10-CM | POA: Diagnosis not present

## 2016-01-19 DIAGNOSIS — N186 End stage renal disease: Secondary | ICD-10-CM | POA: Diagnosis not present

## 2016-01-19 DIAGNOSIS — D689 Coagulation defect, unspecified: Secondary | ICD-10-CM | POA: Diagnosis not present

## 2016-01-19 DIAGNOSIS — D631 Anemia in chronic kidney disease: Secondary | ICD-10-CM | POA: Diagnosis not present

## 2016-01-21 DIAGNOSIS — D689 Coagulation defect, unspecified: Secondary | ICD-10-CM | POA: Diagnosis not present

## 2016-01-21 DIAGNOSIS — Z23 Encounter for immunization: Secondary | ICD-10-CM | POA: Diagnosis not present

## 2016-01-21 DIAGNOSIS — D631 Anemia in chronic kidney disease: Secondary | ICD-10-CM | POA: Diagnosis not present

## 2016-01-21 DIAGNOSIS — N186 End stage renal disease: Secondary | ICD-10-CM | POA: Diagnosis not present

## 2016-01-21 DIAGNOSIS — E1129 Type 2 diabetes mellitus with other diabetic kidney complication: Secondary | ICD-10-CM | POA: Diagnosis not present

## 2016-01-24 DIAGNOSIS — D631 Anemia in chronic kidney disease: Secondary | ICD-10-CM | POA: Diagnosis not present

## 2016-01-24 DIAGNOSIS — E1129 Type 2 diabetes mellitus with other diabetic kidney complication: Secondary | ICD-10-CM | POA: Diagnosis not present

## 2016-01-24 DIAGNOSIS — Z23 Encounter for immunization: Secondary | ICD-10-CM | POA: Diagnosis not present

## 2016-01-24 DIAGNOSIS — D689 Coagulation defect, unspecified: Secondary | ICD-10-CM | POA: Diagnosis not present

## 2016-01-24 DIAGNOSIS — N186 End stage renal disease: Secondary | ICD-10-CM | POA: Diagnosis not present

## 2016-01-26 DIAGNOSIS — N186 End stage renal disease: Secondary | ICD-10-CM | POA: Diagnosis not present

## 2016-01-26 DIAGNOSIS — Z23 Encounter for immunization: Secondary | ICD-10-CM | POA: Diagnosis not present

## 2016-01-26 DIAGNOSIS — D689 Coagulation defect, unspecified: Secondary | ICD-10-CM | POA: Diagnosis not present

## 2016-01-26 DIAGNOSIS — D631 Anemia in chronic kidney disease: Secondary | ICD-10-CM | POA: Diagnosis not present

## 2016-01-26 DIAGNOSIS — E1129 Type 2 diabetes mellitus with other diabetic kidney complication: Secondary | ICD-10-CM | POA: Diagnosis not present

## 2016-01-28 DIAGNOSIS — N186 End stage renal disease: Secondary | ICD-10-CM | POA: Diagnosis not present

## 2016-01-28 DIAGNOSIS — E1129 Type 2 diabetes mellitus with other diabetic kidney complication: Secondary | ICD-10-CM | POA: Diagnosis not present

## 2016-01-28 DIAGNOSIS — D631 Anemia in chronic kidney disease: Secondary | ICD-10-CM | POA: Diagnosis not present

## 2016-01-28 DIAGNOSIS — D689 Coagulation defect, unspecified: Secondary | ICD-10-CM | POA: Diagnosis not present

## 2016-01-28 DIAGNOSIS — Z23 Encounter for immunization: Secondary | ICD-10-CM | POA: Diagnosis not present

## 2016-01-31 DIAGNOSIS — Z23 Encounter for immunization: Secondary | ICD-10-CM | POA: Diagnosis not present

## 2016-01-31 DIAGNOSIS — D689 Coagulation defect, unspecified: Secondary | ICD-10-CM | POA: Diagnosis not present

## 2016-01-31 DIAGNOSIS — N186 End stage renal disease: Secondary | ICD-10-CM | POA: Diagnosis not present

## 2016-01-31 DIAGNOSIS — D631 Anemia in chronic kidney disease: Secondary | ICD-10-CM | POA: Diagnosis not present

## 2016-01-31 DIAGNOSIS — E1129 Type 2 diabetes mellitus with other diabetic kidney complication: Secondary | ICD-10-CM | POA: Diagnosis not present

## 2016-02-02 DIAGNOSIS — Z23 Encounter for immunization: Secondary | ICD-10-CM | POA: Diagnosis not present

## 2016-02-02 DIAGNOSIS — E1129 Type 2 diabetes mellitus with other diabetic kidney complication: Secondary | ICD-10-CM | POA: Diagnosis not present

## 2016-02-02 DIAGNOSIS — N186 End stage renal disease: Secondary | ICD-10-CM | POA: Diagnosis not present

## 2016-02-02 DIAGNOSIS — D689 Coagulation defect, unspecified: Secondary | ICD-10-CM | POA: Diagnosis not present

## 2016-02-02 DIAGNOSIS — D631 Anemia in chronic kidney disease: Secondary | ICD-10-CM | POA: Diagnosis not present

## 2016-02-04 DIAGNOSIS — E1129 Type 2 diabetes mellitus with other diabetic kidney complication: Secondary | ICD-10-CM | POA: Diagnosis not present

## 2016-02-04 DIAGNOSIS — D631 Anemia in chronic kidney disease: Secondary | ICD-10-CM | POA: Diagnosis not present

## 2016-02-04 DIAGNOSIS — N186 End stage renal disease: Secondary | ICD-10-CM | POA: Diagnosis not present

## 2016-02-04 DIAGNOSIS — Z23 Encounter for immunization: Secondary | ICD-10-CM | POA: Diagnosis not present

## 2016-02-04 DIAGNOSIS — D689 Coagulation defect, unspecified: Secondary | ICD-10-CM | POA: Diagnosis not present

## 2016-02-07 DIAGNOSIS — E1129 Type 2 diabetes mellitus with other diabetic kidney complication: Secondary | ICD-10-CM | POA: Diagnosis not present

## 2016-02-07 DIAGNOSIS — Z23 Encounter for immunization: Secondary | ICD-10-CM | POA: Diagnosis not present

## 2016-02-07 DIAGNOSIS — D631 Anemia in chronic kidney disease: Secondary | ICD-10-CM | POA: Diagnosis not present

## 2016-02-07 DIAGNOSIS — D689 Coagulation defect, unspecified: Secondary | ICD-10-CM | POA: Diagnosis not present

## 2016-02-07 DIAGNOSIS — N186 End stage renal disease: Secondary | ICD-10-CM | POA: Diagnosis not present

## 2016-02-08 ENCOUNTER — Encounter: Payer: Self-pay | Admitting: Obstetrics & Gynecology

## 2016-02-08 ENCOUNTER — Ambulatory Visit (INDEPENDENT_AMBULATORY_CARE_PROVIDER_SITE_OTHER): Payer: Medicare Other | Admitting: Obstetrics & Gynecology

## 2016-02-08 VITALS — BP 140/60 | HR 112 | Wt 188.0 lb

## 2016-02-08 DIAGNOSIS — N813 Complete uterovaginal prolapse: Secondary | ICD-10-CM

## 2016-02-08 DIAGNOSIS — Z992 Dependence on renal dialysis: Secondary | ICD-10-CM | POA: Diagnosis not present

## 2016-02-08 DIAGNOSIS — E1122 Type 2 diabetes mellitus with diabetic chronic kidney disease: Secondary | ICD-10-CM | POA: Diagnosis not present

## 2016-02-08 DIAGNOSIS — N186 End stage renal disease: Secondary | ICD-10-CM | POA: Diagnosis not present

## 2016-02-08 NOTE — Progress Notes (Signed)
Patient ID: [name], female   DOB: 1931-09-19, 82 y.o.   MRN: [id number] Patient ID: [name], female   DOB: 1932-02-01, 82 y.o.   MRN: [id number] Chief Complaint  Patient presents with  . Follow-up    check pessary/ clean    Blood pressure 140/60, pulse 112, weight 188 lb (85.276 kg).  [name] presents today for routine follow up related to her pessary.   She was fit last week for a Milex ring with support #5 after increased mucosal irritation from a [employer] She reports no vaginal discharge or vaginal bleeding.  Exam reveals no undue vaginal mucosal pressure of breakdown, no discharge and no vaginal bleeding.  The pessary is  replaced without difficulty.    [name] will be sen back in 1 months for continued follow up.  Lazaro Arms, MD   02/08/2016 2:34 PM

## 2016-02-09 DIAGNOSIS — D689 Coagulation defect, unspecified: Secondary | ICD-10-CM | POA: Diagnosis not present

## 2016-02-09 DIAGNOSIS — Z23 Encounter for immunization: Secondary | ICD-10-CM | POA: Diagnosis not present

## 2016-02-09 DIAGNOSIS — E1129 Type 2 diabetes mellitus with other diabetic kidney complication: Secondary | ICD-10-CM | POA: Diagnosis not present

## 2016-02-09 DIAGNOSIS — N2581 Secondary hyperparathyroidism of renal origin: Secondary | ICD-10-CM | POA: Diagnosis not present

## 2016-02-09 DIAGNOSIS — N186 End stage renal disease: Secondary | ICD-10-CM | POA: Diagnosis not present

## 2016-02-09 DIAGNOSIS — Z7689 Persons encountering health services in other specified circumstances: Secondary | ICD-10-CM | POA: Diagnosis not present

## 2016-02-10 DIAGNOSIS — M25571 Pain in right ankle and joints of right foot: Secondary | ICD-10-CM | POA: Diagnosis not present

## 2016-02-10 DIAGNOSIS — M5137 Other intervertebral disc degeneration, lumbosacral region: Secondary | ICD-10-CM | POA: Diagnosis not present

## 2016-02-10 DIAGNOSIS — M25572 Pain in left ankle and joints of left foot: Secondary | ICD-10-CM | POA: Diagnosis not present

## 2016-02-11 DIAGNOSIS — D689 Coagulation defect, unspecified: Secondary | ICD-10-CM | POA: Diagnosis not present

## 2016-02-11 DIAGNOSIS — Z23 Encounter for immunization: Secondary | ICD-10-CM | POA: Diagnosis not present

## 2016-02-11 DIAGNOSIS — Z7689 Persons encountering health services in other specified circumstances: Secondary | ICD-10-CM | POA: Diagnosis not present

## 2016-02-11 DIAGNOSIS — N186 End stage renal disease: Secondary | ICD-10-CM | POA: Diagnosis not present

## 2016-02-11 DIAGNOSIS — N2581 Secondary hyperparathyroidism of renal origin: Secondary | ICD-10-CM | POA: Diagnosis not present

## 2016-02-11 DIAGNOSIS — E1129 Type 2 diabetes mellitus with other diabetic kidney complication: Secondary | ICD-10-CM | POA: Diagnosis not present

## 2016-02-14 DIAGNOSIS — N186 End stage renal disease: Secondary | ICD-10-CM | POA: Diagnosis not present

## 2016-02-14 DIAGNOSIS — N2581 Secondary hyperparathyroidism of renal origin: Secondary | ICD-10-CM | POA: Diagnosis not present

## 2016-02-14 DIAGNOSIS — Z7689 Persons encountering health services in other specified circumstances: Secondary | ICD-10-CM | POA: Diagnosis not present

## 2016-02-14 DIAGNOSIS — D689 Coagulation defect, unspecified: Secondary | ICD-10-CM | POA: Diagnosis not present

## 2016-02-14 DIAGNOSIS — Z23 Encounter for immunization: Secondary | ICD-10-CM | POA: Diagnosis not present

## 2016-02-14 DIAGNOSIS — E1129 Type 2 diabetes mellitus with other diabetic kidney complication: Secondary | ICD-10-CM | POA: Diagnosis not present

## 2016-02-16 DIAGNOSIS — N2581 Secondary hyperparathyroidism of renal origin: Secondary | ICD-10-CM | POA: Diagnosis not present

## 2016-02-16 DIAGNOSIS — Z7689 Persons encountering health services in other specified circumstances: Secondary | ICD-10-CM | POA: Diagnosis not present

## 2016-02-16 DIAGNOSIS — D689 Coagulation defect, unspecified: Secondary | ICD-10-CM | POA: Diagnosis not present

## 2016-02-16 DIAGNOSIS — Z23 Encounter for immunization: Secondary | ICD-10-CM | POA: Diagnosis not present

## 2016-02-16 DIAGNOSIS — E1129 Type 2 diabetes mellitus with other diabetic kidney complication: Secondary | ICD-10-CM | POA: Diagnosis not present

## 2016-02-16 DIAGNOSIS — N186 End stage renal disease: Secondary | ICD-10-CM | POA: Diagnosis not present

## 2016-02-18 DIAGNOSIS — E1129 Type 2 diabetes mellitus with other diabetic kidney complication: Secondary | ICD-10-CM | POA: Diagnosis not present

## 2016-02-18 DIAGNOSIS — N2581 Secondary hyperparathyroidism of renal origin: Secondary | ICD-10-CM | POA: Diagnosis not present

## 2016-02-18 DIAGNOSIS — D689 Coagulation defect, unspecified: Secondary | ICD-10-CM | POA: Diagnosis not present

## 2016-02-18 DIAGNOSIS — Z23 Encounter for immunization: Secondary | ICD-10-CM | POA: Diagnosis not present

## 2016-02-18 DIAGNOSIS — N186 End stage renal disease: Secondary | ICD-10-CM | POA: Diagnosis not present

## 2016-02-18 DIAGNOSIS — Z7689 Persons encountering health services in other specified circumstances: Secondary | ICD-10-CM | POA: Diagnosis not present

## 2016-02-21 DIAGNOSIS — Z23 Encounter for immunization: Secondary | ICD-10-CM | POA: Diagnosis not present

## 2016-02-21 DIAGNOSIS — Z7689 Persons encountering health services in other specified circumstances: Secondary | ICD-10-CM | POA: Diagnosis not present

## 2016-02-21 DIAGNOSIS — N186 End stage renal disease: Secondary | ICD-10-CM | POA: Diagnosis not present

## 2016-02-21 DIAGNOSIS — E1129 Type 2 diabetes mellitus with other diabetic kidney complication: Secondary | ICD-10-CM | POA: Diagnosis not present

## 2016-02-21 DIAGNOSIS — D689 Coagulation defect, unspecified: Secondary | ICD-10-CM | POA: Diagnosis not present

## 2016-02-21 DIAGNOSIS — N2581 Secondary hyperparathyroidism of renal origin: Secondary | ICD-10-CM | POA: Diagnosis not present

## 2016-02-23 DIAGNOSIS — D689 Coagulation defect, unspecified: Secondary | ICD-10-CM | POA: Diagnosis not present

## 2016-02-23 DIAGNOSIS — N186 End stage renal disease: Secondary | ICD-10-CM | POA: Diagnosis not present

## 2016-02-23 DIAGNOSIS — E1129 Type 2 diabetes mellitus with other diabetic kidney complication: Secondary | ICD-10-CM | POA: Diagnosis not present

## 2016-02-23 DIAGNOSIS — Z7689 Persons encountering health services in other specified circumstances: Secondary | ICD-10-CM | POA: Diagnosis not present

## 2016-02-23 DIAGNOSIS — N2581 Secondary hyperparathyroidism of renal origin: Secondary | ICD-10-CM | POA: Diagnosis not present

## 2016-02-23 DIAGNOSIS — Z23 Encounter for immunization: Secondary | ICD-10-CM | POA: Diagnosis not present

## 2016-02-25 DIAGNOSIS — D689 Coagulation defect, unspecified: Secondary | ICD-10-CM | POA: Diagnosis not present

## 2016-02-25 DIAGNOSIS — N186 End stage renal disease: Secondary | ICD-10-CM | POA: Diagnosis not present

## 2016-02-25 DIAGNOSIS — E1129 Type 2 diabetes mellitus with other diabetic kidney complication: Secondary | ICD-10-CM | POA: Diagnosis not present

## 2016-02-25 DIAGNOSIS — Z7689 Persons encountering health services in other specified circumstances: Secondary | ICD-10-CM | POA: Diagnosis not present

## 2016-02-25 DIAGNOSIS — Z23 Encounter for immunization: Secondary | ICD-10-CM | POA: Diagnosis not present

## 2016-02-25 DIAGNOSIS — N2581 Secondary hyperparathyroidism of renal origin: Secondary | ICD-10-CM | POA: Diagnosis not present

## 2016-02-27 DIAGNOSIS — E1140 Type 2 diabetes mellitus with diabetic neuropathy, unspecified: Secondary | ICD-10-CM | POA: Diagnosis not present

## 2016-02-27 DIAGNOSIS — L84 Corns and callosities: Secondary | ICD-10-CM | POA: Diagnosis not present

## 2016-02-28 DIAGNOSIS — E1129 Type 2 diabetes mellitus with other diabetic kidney complication: Secondary | ICD-10-CM | POA: Diagnosis not present

## 2016-02-28 DIAGNOSIS — N186 End stage renal disease: Secondary | ICD-10-CM | POA: Diagnosis not present

## 2016-02-28 DIAGNOSIS — N2581 Secondary hyperparathyroidism of renal origin: Secondary | ICD-10-CM | POA: Diagnosis not present

## 2016-02-28 DIAGNOSIS — D689 Coagulation defect, unspecified: Secondary | ICD-10-CM | POA: Diagnosis not present

## 2016-02-28 DIAGNOSIS — Z23 Encounter for immunization: Secondary | ICD-10-CM | POA: Diagnosis not present

## 2016-02-28 DIAGNOSIS — Z7689 Persons encountering health services in other specified circumstances: Secondary | ICD-10-CM | POA: Diagnosis not present

## 2016-03-01 DIAGNOSIS — Z23 Encounter for immunization: Secondary | ICD-10-CM | POA: Diagnosis not present

## 2016-03-01 DIAGNOSIS — E1129 Type 2 diabetes mellitus with other diabetic kidney complication: Secondary | ICD-10-CM | POA: Diagnosis not present

## 2016-03-01 DIAGNOSIS — N186 End stage renal disease: Secondary | ICD-10-CM | POA: Diagnosis not present

## 2016-03-01 DIAGNOSIS — N2581 Secondary hyperparathyroidism of renal origin: Secondary | ICD-10-CM | POA: Diagnosis not present

## 2016-03-01 DIAGNOSIS — D689 Coagulation defect, unspecified: Secondary | ICD-10-CM | POA: Diagnosis not present

## 2016-03-01 DIAGNOSIS — Z7689 Persons encountering health services in other specified circumstances: Secondary | ICD-10-CM | POA: Diagnosis not present

## 2016-03-03 DIAGNOSIS — Z7689 Persons encountering health services in other specified circumstances: Secondary | ICD-10-CM | POA: Diagnosis not present

## 2016-03-03 DIAGNOSIS — N186 End stage renal disease: Secondary | ICD-10-CM | POA: Diagnosis not present

## 2016-03-03 DIAGNOSIS — Z23 Encounter for immunization: Secondary | ICD-10-CM | POA: Diagnosis not present

## 2016-03-03 DIAGNOSIS — D689 Coagulation defect, unspecified: Secondary | ICD-10-CM | POA: Diagnosis not present

## 2016-03-03 DIAGNOSIS — N2581 Secondary hyperparathyroidism of renal origin: Secondary | ICD-10-CM | POA: Diagnosis not present

## 2016-03-03 DIAGNOSIS — E1129 Type 2 diabetes mellitus with other diabetic kidney complication: Secondary | ICD-10-CM | POA: Diagnosis not present

## 2016-03-06 DIAGNOSIS — Z7689 Persons encountering health services in other specified circumstances: Secondary | ICD-10-CM | POA: Diagnosis not present

## 2016-03-06 DIAGNOSIS — E1129 Type 2 diabetes mellitus with other diabetic kidney complication: Secondary | ICD-10-CM | POA: Diagnosis not present

## 2016-03-06 DIAGNOSIS — N186 End stage renal disease: Secondary | ICD-10-CM | POA: Diagnosis not present

## 2016-03-06 DIAGNOSIS — D689 Coagulation defect, unspecified: Secondary | ICD-10-CM | POA: Diagnosis not present

## 2016-03-06 DIAGNOSIS — N2581 Secondary hyperparathyroidism of renal origin: Secondary | ICD-10-CM | POA: Diagnosis not present

## 2016-03-06 DIAGNOSIS — Z23 Encounter for immunization: Secondary | ICD-10-CM | POA: Diagnosis not present

## 2016-03-08 DIAGNOSIS — Z7689 Persons encountering health services in other specified circumstances: Secondary | ICD-10-CM | POA: Diagnosis not present

## 2016-03-08 DIAGNOSIS — E1129 Type 2 diabetes mellitus with other diabetic kidney complication: Secondary | ICD-10-CM | POA: Diagnosis not present

## 2016-03-08 DIAGNOSIS — D689 Coagulation defect, unspecified: Secondary | ICD-10-CM | POA: Diagnosis not present

## 2016-03-08 DIAGNOSIS — N186 End stage renal disease: Secondary | ICD-10-CM | POA: Diagnosis not present

## 2016-03-08 DIAGNOSIS — N2581 Secondary hyperparathyroidism of renal origin: Secondary | ICD-10-CM | POA: Diagnosis not present

## 2016-03-08 DIAGNOSIS — Z23 Encounter for immunization: Secondary | ICD-10-CM | POA: Diagnosis not present

## 2016-03-09 DIAGNOSIS — N186 End stage renal disease: Secondary | ICD-10-CM | POA: Diagnosis not present

## 2016-03-09 DIAGNOSIS — E1122 Type 2 diabetes mellitus with diabetic chronic kidney disease: Secondary | ICD-10-CM | POA: Diagnosis not present

## 2016-03-09 DIAGNOSIS — Z992 Dependence on renal dialysis: Secondary | ICD-10-CM | POA: Diagnosis not present

## 2016-03-10 DIAGNOSIS — E1129 Type 2 diabetes mellitus with other diabetic kidney complication: Secondary | ICD-10-CM | POA: Diagnosis not present

## 2016-03-10 DIAGNOSIS — D631 Anemia in chronic kidney disease: Secondary | ICD-10-CM | POA: Diagnosis not present

## 2016-03-10 DIAGNOSIS — D689 Coagulation defect, unspecified: Secondary | ICD-10-CM | POA: Diagnosis not present

## 2016-03-10 DIAGNOSIS — N186 End stage renal disease: Secondary | ICD-10-CM | POA: Diagnosis not present

## 2016-03-13 DIAGNOSIS — D689 Coagulation defect, unspecified: Secondary | ICD-10-CM | POA: Diagnosis not present

## 2016-03-13 DIAGNOSIS — D631 Anemia in chronic kidney disease: Secondary | ICD-10-CM | POA: Diagnosis not present

## 2016-03-13 DIAGNOSIS — E1129 Type 2 diabetes mellitus with other diabetic kidney complication: Secondary | ICD-10-CM | POA: Diagnosis not present

## 2016-03-13 DIAGNOSIS — N186 End stage renal disease: Secondary | ICD-10-CM | POA: Diagnosis not present

## 2016-03-15 ENCOUNTER — Encounter: Payer: Self-pay | Admitting: Vascular Surgery

## 2016-03-15 DIAGNOSIS — E1129 Type 2 diabetes mellitus with other diabetic kidney complication: Secondary | ICD-10-CM | POA: Diagnosis not present

## 2016-03-15 DIAGNOSIS — N186 End stage renal disease: Secondary | ICD-10-CM | POA: Diagnosis not present

## 2016-03-15 DIAGNOSIS — D689 Coagulation defect, unspecified: Secondary | ICD-10-CM | POA: Diagnosis not present

## 2016-03-15 DIAGNOSIS — D631 Anemia in chronic kidney disease: Secondary | ICD-10-CM | POA: Diagnosis not present

## 2016-03-16 ENCOUNTER — Other Ambulatory Visit: Payer: Self-pay | Admitting: *Deleted

## 2016-03-16 ENCOUNTER — Encounter: Payer: Self-pay | Admitting: Vascular Surgery

## 2016-03-16 ENCOUNTER — Ambulatory Visit (INDEPENDENT_AMBULATORY_CARE_PROVIDER_SITE_OTHER): Payer: Medicare Other | Admitting: Vascular Surgery

## 2016-03-16 VITALS — BP 138/63 | HR 71 | Ht 66.0 in | Wt 188.0 lb

## 2016-03-16 DIAGNOSIS — N186 End stage renal disease: Secondary | ICD-10-CM

## 2016-03-16 DIAGNOSIS — N185 Chronic kidney disease, stage 5: Secondary | ICD-10-CM

## 2016-03-16 NOTE — Progress Notes (Signed)
Vascular and Vein Specialist of [facility]  Patient name: [name] MRN: [id number] DOB: 1931/11/30 Sex: female  REASON FOR VISIT: Problems cannulating AV fistula. Further by Dr. Casimiro Needle  HPI: [name] is a 82 y.o. female who had a left brachiocephalic fistula placed by Dr. Leonides Sake on 01/26/2015. Recently they have been having problems cannulating her fistula. She was sent for evaluation. She is not had a duplex scan.  She denies pain or paresthesias in her left upper extremity.  She dialyzes on Tuesdays Thursdays and Saturdays at [facility].  Past Medical History  Diagnosis Date  . CHF (congestive heart failure) (HCC)      preserved left ventricular systolic function  . Hypertension   . Overweight(278.02)   . Hypothyroid   . Vaginal pessary present ~ 2012  . CKD (chronic kidney disease) stage 4, GFR 15-29 ml/min (HCC)     [name] 02/16/2013  . Anemia associated with chronic renal failure     ESA therapy  . History of blood transfusion 1975  . Rheumatoid arthritis (HCC)   . Osteoporosis   . Pneumonia 02/2011    [name] 02/20/2011 (02/16/2013)  . Diabetic foot ulcers (HCC)     "get them on borh feet" (02/16/2013)  . Shortness of breath dyspnea     with exertion  . Type II diabetes mellitus (HCC)     patient denies    Family History  Problem Relation Age of Onset  . Stroke Other   . Heart disease Other   . Kidney disease Other   . Cancer Daughter     cervical  . Heart disease Daughter     before age 23  . Cancer Grandchild     lymph nodes  . Heart disease Mother     before age 6  . Diabetes Father   . Heart disease Sister     before age 48  . Heart disease Brother     before age 82    SOCIAL HISTORY: Social History  Substance Use Topics  . Smoking status: Never Smoker   . Smokeless tobacco: Never Used  . Alcohol Use: No    Allergies  Allergen Reactions  . Iodinated Diagnostic Agents Other (See Comments)    REACTION:   unknown  . Lipitor [Atorvastatin]     Current Outpatient Prescriptions  Medication Sig Dispense Refill  . ALPRAZolam (XANAX) 0.5 MG tablet Take 0.5 mg by mouth daily as needed for anxiety.     . cholecalciferol (VITAMIN D) 400 units TABS tablet Take 400 Units by mouth.    . fish oil-omega-3 fatty acids 1000 MG capsule Take 1 capsule by mouth daily.      Marland Kitchen levothyroxine (SYNTHROID, LEVOTHROID) 125 MCG tablet Take 125 mcg by mouth daily before breakfast.     . sevelamer carbonate (RENVELA) 800 MG tablet Take 800 mg by mouth 3 (three) times daily with meals.     No current facility-administered medications for this visit.    REVIEW OF SYSTEMS:  [X]  denotes positive finding, [ ]  denotes negative finding Cardiac  Comments:  Chest pain or chest pressure:    Shortness of breath upon exertion:    Short of breath when lying flat:    Irregular heart rhythm:        Vascular    Pain in calf, thigh, or hip brought on by ambulation:    Pain in feet at night that wakes you up from your sleep:  Blood clot in your veins:    Leg swelling:         Pulmonary    Oxygen at home:    Productive cough:     Wheezing:         Neurologic    Sudden weakness in arms or legs:     Sudden numbness in arms or legs:     Sudden onset of difficulty speaking or slurred speech:    Temporary loss of vision in one eye:     Problems with dizziness:         Gastrointestinal    Blood in stool:     Vomited blood:         Genitourinary    Burning when urinating:     Blood in urine:        Psychiatric    Major depression:         Hematologic    Bleeding problems:    Problems with blood clotting too easily:        Skin    Rashes or ulcers:        Constitutional    Fever or chills:      PHYSICAL EXAM: Filed Vitals:   03/16/16 0947  BP: 138/63  Pulse: 71  Height: 5\' 6"  (1.676 m)  Weight: 188 lb (85.276 kg)  SpO2: 98%    GENERAL: The patient is a well-nourished female, in no acute distress.  The vital signs are documented above. CARDIAC: There is a regular rate and rhythm.  VASCULAR: she has a good thrill in her left upper arm fistula however she has significant swelling in the mid upper arm from an infiltrate or hematoma. PULMONARY: There is good air exchange bilaterally without wheezing or rales.  MEDICAL ISSUES:  POORLY FUNCTIONING LEFT UPPER ARM FISTULA:  It appears that the patient had an infiltrate in her left upper arm fistula. She has significant swelling. The fistula has an excellent thrill but I think that it should not be used until the swelling has resolved. She has a functioning catheter. I set her up for a follow up visit in 6 weeks. We will get a duplex at that time. If the swelling has resolved and there are no problems on duplex than they can begin using the fistula again. If there are still problems we'll need to consider doing a fistulogram.   [facility] [name] [phone]

## 2016-03-17 DIAGNOSIS — D689 Coagulation defect, unspecified: Secondary | ICD-10-CM | POA: Diagnosis not present

## 2016-03-17 DIAGNOSIS — D631 Anemia in chronic kidney disease: Secondary | ICD-10-CM | POA: Diagnosis not present

## 2016-03-17 DIAGNOSIS — N186 End stage renal disease: Secondary | ICD-10-CM | POA: Diagnosis not present

## 2016-03-17 DIAGNOSIS — E1129 Type 2 diabetes mellitus with other diabetic kidney complication: Secondary | ICD-10-CM | POA: Diagnosis not present

## 2016-03-20 DIAGNOSIS — E1129 Type 2 diabetes mellitus with other diabetic kidney complication: Secondary | ICD-10-CM | POA: Diagnosis not present

## 2016-03-20 DIAGNOSIS — N186 End stage renal disease: Secondary | ICD-10-CM | POA: Diagnosis not present

## 2016-03-20 DIAGNOSIS — D689 Coagulation defect, unspecified: Secondary | ICD-10-CM | POA: Diagnosis not present

## 2016-03-20 DIAGNOSIS — D631 Anemia in chronic kidney disease: Secondary | ICD-10-CM | POA: Diagnosis not present

## 2016-03-22 DIAGNOSIS — D631 Anemia in chronic kidney disease: Secondary | ICD-10-CM | POA: Diagnosis not present

## 2016-03-22 DIAGNOSIS — E1129 Type 2 diabetes mellitus with other diabetic kidney complication: Secondary | ICD-10-CM | POA: Diagnosis not present

## 2016-03-22 DIAGNOSIS — N186 End stage renal disease: Secondary | ICD-10-CM | POA: Diagnosis not present

## 2016-03-22 DIAGNOSIS — D689 Coagulation defect, unspecified: Secondary | ICD-10-CM | POA: Diagnosis not present

## 2016-03-26 DIAGNOSIS — N186 End stage renal disease: Secondary | ICD-10-CM | POA: Diagnosis not present

## 2016-03-26 DIAGNOSIS — D689 Coagulation defect, unspecified: Secondary | ICD-10-CM | POA: Diagnosis not present

## 2016-03-26 DIAGNOSIS — D631 Anemia in chronic kidney disease: Secondary | ICD-10-CM | POA: Diagnosis not present

## 2016-03-26 DIAGNOSIS — E1129 Type 2 diabetes mellitus with other diabetic kidney complication: Secondary | ICD-10-CM | POA: Diagnosis not present

## 2016-03-27 DIAGNOSIS — N186 End stage renal disease: Secondary | ICD-10-CM | POA: Diagnosis not present

## 2016-03-27 DIAGNOSIS — D631 Anemia in chronic kidney disease: Secondary | ICD-10-CM | POA: Diagnosis not present

## 2016-03-27 DIAGNOSIS — D689 Coagulation defect, unspecified: Secondary | ICD-10-CM | POA: Diagnosis not present

## 2016-03-27 DIAGNOSIS — E1129 Type 2 diabetes mellitus with other diabetic kidney complication: Secondary | ICD-10-CM | POA: Diagnosis not present

## 2016-03-29 DIAGNOSIS — D631 Anemia in chronic kidney disease: Secondary | ICD-10-CM | POA: Diagnosis not present

## 2016-03-29 DIAGNOSIS — D689 Coagulation defect, unspecified: Secondary | ICD-10-CM | POA: Diagnosis not present

## 2016-03-29 DIAGNOSIS — N186 End stage renal disease: Secondary | ICD-10-CM | POA: Diagnosis not present

## 2016-03-29 DIAGNOSIS — E1129 Type 2 diabetes mellitus with other diabetic kidney complication: Secondary | ICD-10-CM | POA: Diagnosis not present

## 2016-03-31 DIAGNOSIS — D631 Anemia in chronic kidney disease: Secondary | ICD-10-CM | POA: Diagnosis not present

## 2016-03-31 DIAGNOSIS — E1129 Type 2 diabetes mellitus with other diabetic kidney complication: Secondary | ICD-10-CM | POA: Diagnosis not present

## 2016-03-31 DIAGNOSIS — D689 Coagulation defect, unspecified: Secondary | ICD-10-CM | POA: Diagnosis not present

## 2016-03-31 DIAGNOSIS — N186 End stage renal disease: Secondary | ICD-10-CM | POA: Diagnosis not present

## 2016-04-03 DIAGNOSIS — E1129 Type 2 diabetes mellitus with other diabetic kidney complication: Secondary | ICD-10-CM | POA: Diagnosis not present

## 2016-04-03 DIAGNOSIS — N186 End stage renal disease: Secondary | ICD-10-CM | POA: Diagnosis not present

## 2016-04-03 DIAGNOSIS — D631 Anemia in chronic kidney disease: Secondary | ICD-10-CM | POA: Diagnosis not present

## 2016-04-03 DIAGNOSIS — D689 Coagulation defect, unspecified: Secondary | ICD-10-CM | POA: Diagnosis not present

## 2016-04-05 DIAGNOSIS — N186 End stage renal disease: Secondary | ICD-10-CM | POA: Diagnosis not present

## 2016-04-05 DIAGNOSIS — D631 Anemia in chronic kidney disease: Secondary | ICD-10-CM | POA: Diagnosis not present

## 2016-04-05 DIAGNOSIS — D689 Coagulation defect, unspecified: Secondary | ICD-10-CM | POA: Diagnosis not present

## 2016-04-05 DIAGNOSIS — E1129 Type 2 diabetes mellitus with other diabetic kidney complication: Secondary | ICD-10-CM | POA: Diagnosis not present

## 2016-04-07 DIAGNOSIS — D631 Anemia in chronic kidney disease: Secondary | ICD-10-CM | POA: Diagnosis not present

## 2016-04-07 DIAGNOSIS — D689 Coagulation defect, unspecified: Secondary | ICD-10-CM | POA: Diagnosis not present

## 2016-04-07 DIAGNOSIS — E1129 Type 2 diabetes mellitus with other diabetic kidney complication: Secondary | ICD-10-CM | POA: Diagnosis not present

## 2016-04-07 DIAGNOSIS — N186 End stage renal disease: Secondary | ICD-10-CM | POA: Diagnosis not present

## 2016-04-09 DIAGNOSIS — N186 End stage renal disease: Secondary | ICD-10-CM | POA: Diagnosis not present

## 2016-04-09 DIAGNOSIS — Z992 Dependence on renal dialysis: Secondary | ICD-10-CM | POA: Diagnosis not present

## 2016-04-09 DIAGNOSIS — E1122 Type 2 diabetes mellitus with diabetic chronic kidney disease: Secondary | ICD-10-CM | POA: Diagnosis not present

## 2016-04-10 DIAGNOSIS — D689 Coagulation defect, unspecified: Secondary | ICD-10-CM | POA: Diagnosis not present

## 2016-04-10 DIAGNOSIS — N186 End stage renal disease: Secondary | ICD-10-CM | POA: Diagnosis not present

## 2016-04-10 DIAGNOSIS — E1129 Type 2 diabetes mellitus with other diabetic kidney complication: Secondary | ICD-10-CM | POA: Diagnosis not present

## 2016-04-10 DIAGNOSIS — D631 Anemia in chronic kidney disease: Secondary | ICD-10-CM | POA: Diagnosis not present

## 2016-04-12 DIAGNOSIS — D689 Coagulation defect, unspecified: Secondary | ICD-10-CM | POA: Diagnosis not present

## 2016-04-12 DIAGNOSIS — N186 End stage renal disease: Secondary | ICD-10-CM | POA: Diagnosis not present

## 2016-04-12 DIAGNOSIS — D631 Anemia in chronic kidney disease: Secondary | ICD-10-CM | POA: Diagnosis not present

## 2016-04-12 DIAGNOSIS — E1129 Type 2 diabetes mellitus with other diabetic kidney complication: Secondary | ICD-10-CM | POA: Diagnosis not present

## 2016-04-14 DIAGNOSIS — D631 Anemia in chronic kidney disease: Secondary | ICD-10-CM | POA: Diagnosis not present

## 2016-04-14 DIAGNOSIS — N186 End stage renal disease: Secondary | ICD-10-CM | POA: Diagnosis not present

## 2016-04-14 DIAGNOSIS — D689 Coagulation defect, unspecified: Secondary | ICD-10-CM | POA: Diagnosis not present

## 2016-04-14 DIAGNOSIS — E1129 Type 2 diabetes mellitus with other diabetic kidney complication: Secondary | ICD-10-CM | POA: Diagnosis not present

## 2016-04-17 DIAGNOSIS — E1129 Type 2 diabetes mellitus with other diabetic kidney complication: Secondary | ICD-10-CM | POA: Diagnosis not present

## 2016-04-17 DIAGNOSIS — D631 Anemia in chronic kidney disease: Secondary | ICD-10-CM | POA: Diagnosis not present

## 2016-04-17 DIAGNOSIS — N186 End stage renal disease: Secondary | ICD-10-CM | POA: Diagnosis not present

## 2016-04-17 DIAGNOSIS — D689 Coagulation defect, unspecified: Secondary | ICD-10-CM | POA: Diagnosis not present

## 2016-04-19 DIAGNOSIS — D689 Coagulation defect, unspecified: Secondary | ICD-10-CM | POA: Diagnosis not present

## 2016-04-19 DIAGNOSIS — D631 Anemia in chronic kidney disease: Secondary | ICD-10-CM | POA: Diagnosis not present

## 2016-04-19 DIAGNOSIS — N186 End stage renal disease: Secondary | ICD-10-CM | POA: Diagnosis not present

## 2016-04-19 DIAGNOSIS — E1129 Type 2 diabetes mellitus with other diabetic kidney complication: Secondary | ICD-10-CM | POA: Diagnosis not present

## 2016-04-21 DIAGNOSIS — E1129 Type 2 diabetes mellitus with other diabetic kidney complication: Secondary | ICD-10-CM | POA: Diagnosis not present

## 2016-04-21 DIAGNOSIS — D689 Coagulation defect, unspecified: Secondary | ICD-10-CM | POA: Diagnosis not present

## 2016-04-21 DIAGNOSIS — N186 End stage renal disease: Secondary | ICD-10-CM | POA: Diagnosis not present

## 2016-04-21 DIAGNOSIS — D631 Anemia in chronic kidney disease: Secondary | ICD-10-CM | POA: Diagnosis not present

## 2016-04-24 DIAGNOSIS — E1129 Type 2 diabetes mellitus with other diabetic kidney complication: Secondary | ICD-10-CM | POA: Diagnosis not present

## 2016-04-24 DIAGNOSIS — D631 Anemia in chronic kidney disease: Secondary | ICD-10-CM | POA: Diagnosis not present

## 2016-04-24 DIAGNOSIS — N186 End stage renal disease: Secondary | ICD-10-CM | POA: Diagnosis not present

## 2016-04-24 DIAGNOSIS — D689 Coagulation defect, unspecified: Secondary | ICD-10-CM | POA: Diagnosis not present

## 2016-04-26 DIAGNOSIS — E1129 Type 2 diabetes mellitus with other diabetic kidney complication: Secondary | ICD-10-CM | POA: Diagnosis not present

## 2016-04-26 DIAGNOSIS — D631 Anemia in chronic kidney disease: Secondary | ICD-10-CM | POA: Diagnosis not present

## 2016-04-26 DIAGNOSIS — N186 End stage renal disease: Secondary | ICD-10-CM | POA: Diagnosis not present

## 2016-04-26 DIAGNOSIS — D689 Coagulation defect, unspecified: Secondary | ICD-10-CM | POA: Diagnosis not present

## 2016-04-28 DIAGNOSIS — N186 End stage renal disease: Secondary | ICD-10-CM | POA: Diagnosis not present

## 2016-04-28 DIAGNOSIS — D689 Coagulation defect, unspecified: Secondary | ICD-10-CM | POA: Diagnosis not present

## 2016-04-28 DIAGNOSIS — E1129 Type 2 diabetes mellitus with other diabetic kidney complication: Secondary | ICD-10-CM | POA: Diagnosis not present

## 2016-04-28 DIAGNOSIS — D631 Anemia in chronic kidney disease: Secondary | ICD-10-CM | POA: Diagnosis not present

## 2016-05-01 DIAGNOSIS — N186 End stage renal disease: Secondary | ICD-10-CM | POA: Diagnosis not present

## 2016-05-01 DIAGNOSIS — E1129 Type 2 diabetes mellitus with other diabetic kidney complication: Secondary | ICD-10-CM | POA: Diagnosis not present

## 2016-05-01 DIAGNOSIS — D631 Anemia in chronic kidney disease: Secondary | ICD-10-CM | POA: Diagnosis not present

## 2016-05-01 DIAGNOSIS — D689 Coagulation defect, unspecified: Secondary | ICD-10-CM | POA: Diagnosis not present

## 2016-05-03 DIAGNOSIS — N186 End stage renal disease: Secondary | ICD-10-CM | POA: Diagnosis not present

## 2016-05-03 DIAGNOSIS — D689 Coagulation defect, unspecified: Secondary | ICD-10-CM | POA: Diagnosis not present

## 2016-05-03 DIAGNOSIS — D631 Anemia in chronic kidney disease: Secondary | ICD-10-CM | POA: Diagnosis not present

## 2016-05-03 DIAGNOSIS — E1129 Type 2 diabetes mellitus with other diabetic kidney complication: Secondary | ICD-10-CM | POA: Diagnosis not present

## 2016-05-04 ENCOUNTER — Encounter: Payer: Self-pay | Admitting: Vascular Surgery

## 2016-05-04 DIAGNOSIS — E1129 Type 2 diabetes mellitus with other diabetic kidney complication: Secondary | ICD-10-CM | POA: Diagnosis not present

## 2016-05-04 DIAGNOSIS — D631 Anemia in chronic kidney disease: Secondary | ICD-10-CM | POA: Diagnosis not present

## 2016-05-04 DIAGNOSIS — N186 End stage renal disease: Secondary | ICD-10-CM | POA: Diagnosis not present

## 2016-05-04 DIAGNOSIS — D689 Coagulation defect, unspecified: Secondary | ICD-10-CM | POA: Diagnosis not present

## 2016-05-08 DIAGNOSIS — E1129 Type 2 diabetes mellitus with other diabetic kidney complication: Secondary | ICD-10-CM | POA: Diagnosis not present

## 2016-05-08 DIAGNOSIS — D689 Coagulation defect, unspecified: Secondary | ICD-10-CM | POA: Diagnosis not present

## 2016-05-08 DIAGNOSIS — D631 Anemia in chronic kidney disease: Secondary | ICD-10-CM | POA: Diagnosis not present

## 2016-05-08 DIAGNOSIS — N186 End stage renal disease: Secondary | ICD-10-CM | POA: Diagnosis not present

## 2016-05-09 ENCOUNTER — Encounter: Payer: Self-pay | Admitting: *Deleted

## 2016-05-09 ENCOUNTER — Ambulatory Visit (INDEPENDENT_AMBULATORY_CARE_PROVIDER_SITE_OTHER): Payer: Medicare Other | Admitting: Vascular Surgery

## 2016-05-09 ENCOUNTER — Ambulatory Visit (HOSPITAL_COMMUNITY)
Admission: RE | Admit: 2016-05-09 | Discharge: 2016-05-09 | Disposition: A | Discharge status: Home / Self Care | Payer: Medicare Other | Source: Ambulatory Visit | Attending: Vascular Surgery | Admitting: Vascular Surgery

## 2016-05-09 ENCOUNTER — Encounter: Payer: Self-pay | Admitting: Vascular Surgery

## 2016-05-09 VITALS — BP 134/67 | HR 71 | Ht 66.0 in | Wt 188.0 lb

## 2016-05-09 DIAGNOSIS — T7849XD Other allergy, subsequent encounter: Secondary | ICD-10-CM | POA: Diagnosis not present

## 2016-05-09 DIAGNOSIS — N186 End stage renal disease: Secondary | ICD-10-CM | POA: Insufficient documentation

## 2016-05-09 DIAGNOSIS — T508X5D Adverse effect of diagnostic agents, subsequent encounter: Secondary | ICD-10-CM

## 2016-05-09 DIAGNOSIS — Z992 Dependence on renal dialysis: Secondary | ICD-10-CM | POA: Diagnosis not present

## 2016-05-09 DIAGNOSIS — Z9889 Other specified postprocedural states: Secondary | ICD-10-CM | POA: Diagnosis not present

## 2016-05-09 NOTE — Progress Notes (Signed)
Patient name: [name] MRN: [id number] DOB: 1932/09/06 Sex: female  HPI: [name] is a 82 y.o. female, who returns today for follow up on her left AV fistula.    She was last seen on 03/16/2016 secondary to cannulation problems of her fistula.  She had significant swelling and was asked to rest it for 6 weeks.  She currently has a working left IJ tunneled catheter.    She is here today for AV fistula check and duplex scan.  She reports no medical changes since her last visit.    Past Medical History:  Diagnosis Date  . Anemia associated with chronic renal failure    ESA therapy  . CHF (congestive heart failure) (HCC)     preserved left ventricular systolic function  . CKD (chronic kidney disease) stage 4, GFR 15-29 ml/min (HCC)    [name] 02/16/2013  . Diabetic foot ulcers (HCC)    "get them on borh feet" (02/16/2013)  . History of blood transfusion 1975  . Hypertension   . Hypothyroid   . Osteoporosis   . Overweight(278.02)   . Pneumonia 02/2011   [name] 02/20/2011 (02/16/2013)  . Rheumatoid arthritis (HCC)   . Shortness of breath dyspnea    with exertion  . Type II diabetes mellitus (HCC)    patient denies  . Vaginal pessary present ~ 2012   Past Surgical History:  Procedure Laterality Date  . AV FISTULA PLACEMENT Left 01/26/2015   Procedure: LEFT ARM ARTERIOVENOUS (AV) FISTULA CREATION;  Surgeon: [name], MD;  Location: [facility] OR;  Service: Vascular;  Laterality: Left;  . CATARACT EXTRACTION W/ INTRAOCULAR LENS  IMPLANT, BILATERAL Bilateral 1990's  . ORIF FEMUR FRACTURE Left 1975   "got a rod and screw in it" (02/16/2013)    Family History  Problem Relation Age of Onset  . Heart disease Mother     before age 66  . Diabetes Father   . Stroke Other   . Heart disease Other   . Kidney disease Other   . Cancer Daughter     cervical  . Heart disease Daughter     before age 52  . Cancer Grandchild     lymph nodes  . Heart disease Sister     before age 9    . Heart disease Brother     before age 23    SOCIAL HISTORY: Social History   Social History  . Marital status: Divorced    Spouse name: N/A  . Number of children: N/A  . Years of education: N/A   Occupational History  . Retired - [employer]    Social History Main Topics  . Smoking status: Never Smoker  . Smokeless tobacco: Never Used  . Alcohol use No  . Drug use: No  . Sexual activity: No   Other Topics Concern  . Not on file   Social History Narrative   Divorced   No regular exercise    Allergies  Allergen Reactions  . Iodinated Diagnostic Agents Other (See Comments)    REACTION:  unknown  . Lipitor [Atorvastatin]     Current Outpatient Prescriptions  Medication Sig Dispense Refill  . ALPRAZolam (XANAX) 0.5 MG tablet Take 0.5 mg by mouth daily as needed for anxiety.     . cholecalciferol (VITAMIN D) 400 units TABS tablet Take 400 Units by mouth.    . fish oil-omega-3 fatty acids 1000 MG capsule Take 1 capsule by mouth daily.      Marland Kitchen  levothyroxine (SYNTHROID, LEVOTHROID) 125 MCG tablet Take 125 mcg by mouth daily before breakfast.     . sevelamer carbonate (RENVELA) 800 MG tablet Take 800 mg by mouth 3 (three) times daily with meals.     No current facility-administered medications for this visit.     ROS:   General:  No weight loss, Fever, chills  HEENT: No recent headaches, no nasal bleeding, no visual changes, no sore throat  Neurologic: No dizziness, blackouts, seizures. No recent symptoms of stroke or mini- stroke. No recent episodes of slurred speech, or temporary blindness.  Cardiac: No recent episodes of chest pain/pressure, no shortness of breath at rest.  No shortness of breath with exertion.  Denies history of atrial fibrillation or irregular heartbeat  Vascular: No history of rest pain in feet.  No history of claudication.  No history of non-healing ulcer, No history of DVT   Pulmonary: No home oxygen, no productive cough, no hemoptysis,   No asthma or wheezing  Musculoskeletal:  [ ] Arthritis, [ ] Low back pain,  [ ] Joint pain  Hematologic:No history of hypercoagulable state.  No history of easy bleeding.  No history of anemia  Gastrointestinal: No hematochezia or melena,  No gastroesophageal reflux, no trouble swallowing  Urinary: [ ] chronic Kidney disease, [ ] on HD - [ ] MWF or [ ] TTHS, [ ] Burning with urination, [ ] Frequent urination, [ ] Difficulty urinating;   Skin: No rashes  Psychological: No history of anxiety,  No history of depression   Physical Examination  Vitals:   05/09/16 1345  BP: 134/67  Pulse: 71  SpO2: 99%  Weight: 188 lb (85.3 kg)  Height: 5' 6" (1.676 m)    Body mass index is 30.34 kg/m.  General:  Alert and oriented, no acute distress HEENT: Normal Neck: No bruit or JVD Pulmonary: Clear to auscultation bilaterally Cardiac: Regular Rate and Rhythm without murmur Abdomen: Soft, non-tender, non-distended, no mass, no scars Skin: No rash Extremity Pulses:  2+ radial, brachial,Excelent thrill in the distal av fistula, but more proximal the thrill is hard to locate and weaker with a pulse at the proximal half Musculoskeletal: No deformity or edema  Neurologic: Upper and lower extremity motor 5/5 and symmetric  DATA:  Left brachialcephalic fistula duplex There is an area of concern measuring 1.9 cm in diameter at the distal brachial vein  ASSESSMENT:   ESRD Malfunctioning left brachiocephalic av fistula  PLAN:   At this time we recommend AV fistulogram Mon. 11th 2017 by Dr. Dickson to better delineate the areas of concern.   The patient agrees to proceed.  She does have a contrast dye allergy and will need to be pre medicated. She will continue to use the catheter for HD.  She has HD T-TH-Sat in [place].   Nilo Fallin MAUREEN PA-C [facility] [place]  The patient was seen in conjunction with Dr. Dickson today  I have interviewed the patient and  examined the patient. I agree with the findings by the PA. The fistula has a good thrill just above the antecubital level but after that is very difficult to follow. In addition, the vein is very tortuous. I do not think the fistula can be used at this point and she is previously had problems with a significant infiltrate. I have recommended that we proceed with a fistulogram before planning possible surgical revision. One option would be to mobilize the vein to reduce the redundancy. She   does have a dye allergy and will need to be premedicated. This has been scheduled for 05/21/2016.  Cari Caraway, MD [phone]

## 2016-05-10 ENCOUNTER — Ambulatory Visit: Payer: Medicare Other | Admitting: Obstetrics & Gynecology

## 2016-05-10 ENCOUNTER — Other Ambulatory Visit: Payer: Self-pay | Admitting: *Deleted

## 2016-05-10 DIAGNOSIS — D689 Coagulation defect, unspecified: Secondary | ICD-10-CM | POA: Diagnosis not present

## 2016-05-10 DIAGNOSIS — E1122 Type 2 diabetes mellitus with diabetic chronic kidney disease: Secondary | ICD-10-CM | POA: Diagnosis not present

## 2016-05-10 DIAGNOSIS — N186 End stage renal disease: Secondary | ICD-10-CM | POA: Diagnosis not present

## 2016-05-10 DIAGNOSIS — D631 Anemia in chronic kidney disease: Secondary | ICD-10-CM | POA: Diagnosis not present

## 2016-05-10 DIAGNOSIS — Z992 Dependence on renal dialysis: Secondary | ICD-10-CM | POA: Diagnosis not present

## 2016-05-10 DIAGNOSIS — E1129 Type 2 diabetes mellitus with other diabetic kidney complication: Secondary | ICD-10-CM | POA: Diagnosis not present

## 2016-05-10 MED ORDER — PREDNISONE 50 MG PO TABS
ORAL_TABLET | ORAL | 0 refills | Status: DC
Start: 2016-05-10 — End: 2016-07-11

## 2016-05-10 MED ORDER — DIPHENHYDRAMINE HCL 25 MG PO CAPS
ORAL_CAPSULE | ORAL | 0 refills | Status: DC
Start: 2016-05-10 — End: 2016-07-11

## 2016-05-10 NOTE — Addendum Note (Signed)
Addended by: Sharee Pimple on: 05/10/2016 11:42 AM   Modules accepted: Orders

## 2016-05-12 DIAGNOSIS — E8779 Other fluid overload: Secondary | ICD-10-CM | POA: Diagnosis not present

## 2016-05-12 DIAGNOSIS — N2581 Secondary hyperparathyroidism of renal origin: Secondary | ICD-10-CM | POA: Diagnosis not present

## 2016-05-12 DIAGNOSIS — D631 Anemia in chronic kidney disease: Secondary | ICD-10-CM | POA: Diagnosis not present

## 2016-05-12 DIAGNOSIS — N186 End stage renal disease: Secondary | ICD-10-CM | POA: Diagnosis not present

## 2016-05-12 DIAGNOSIS — D689 Coagulation defect, unspecified: Secondary | ICD-10-CM | POA: Diagnosis not present

## 2016-05-12 DIAGNOSIS — Z23 Encounter for immunization: Secondary | ICD-10-CM | POA: Diagnosis not present

## 2016-05-12 DIAGNOSIS — E1129 Type 2 diabetes mellitus with other diabetic kidney complication: Secondary | ICD-10-CM | POA: Diagnosis not present

## 2016-05-15 DIAGNOSIS — N2581 Secondary hyperparathyroidism of renal origin: Secondary | ICD-10-CM | POA: Diagnosis not present

## 2016-05-15 DIAGNOSIS — E8779 Other fluid overload: Secondary | ICD-10-CM | POA: Diagnosis not present

## 2016-05-15 DIAGNOSIS — D689 Coagulation defect, unspecified: Secondary | ICD-10-CM | POA: Diagnosis not present

## 2016-05-15 DIAGNOSIS — Z23 Encounter for immunization: Secondary | ICD-10-CM | POA: Diagnosis not present

## 2016-05-15 DIAGNOSIS — N186 End stage renal disease: Secondary | ICD-10-CM | POA: Diagnosis not present

## 2016-05-15 DIAGNOSIS — D631 Anemia in chronic kidney disease: Secondary | ICD-10-CM | POA: Diagnosis not present

## 2016-05-15 DIAGNOSIS — E1129 Type 2 diabetes mellitus with other diabetic kidney complication: Secondary | ICD-10-CM | POA: Diagnosis not present

## 2016-05-16 ENCOUNTER — Encounter: Payer: Self-pay | Admitting: Obstetrics & Gynecology

## 2016-05-16 ENCOUNTER — Ambulatory Visit (INDEPENDENT_AMBULATORY_CARE_PROVIDER_SITE_OTHER): Payer: Medicare Other | Admitting: Obstetrics & Gynecology

## 2016-05-16 VITALS — BP 130/50 | HR 74 | Wt 195.0 lb

## 2016-05-16 DIAGNOSIS — N813 Complete uterovaginal prolapse: Secondary | ICD-10-CM

## 2016-05-17 DIAGNOSIS — N2581 Secondary hyperparathyroidism of renal origin: Secondary | ICD-10-CM | POA: Diagnosis not present

## 2016-05-17 DIAGNOSIS — E8779 Other fluid overload: Secondary | ICD-10-CM | POA: Diagnosis not present

## 2016-05-17 DIAGNOSIS — E1129 Type 2 diabetes mellitus with other diabetic kidney complication: Secondary | ICD-10-CM | POA: Diagnosis not present

## 2016-05-17 DIAGNOSIS — D689 Coagulation defect, unspecified: Secondary | ICD-10-CM | POA: Diagnosis not present

## 2016-05-17 DIAGNOSIS — Z23 Encounter for immunization: Secondary | ICD-10-CM | POA: Diagnosis not present

## 2016-05-17 DIAGNOSIS — N186 End stage renal disease: Secondary | ICD-10-CM | POA: Diagnosis not present

## 2016-05-17 DIAGNOSIS — D631 Anemia in chronic kidney disease: Secondary | ICD-10-CM | POA: Diagnosis not present

## 2016-05-19 DIAGNOSIS — E1129 Type 2 diabetes mellitus with other diabetic kidney complication: Secondary | ICD-10-CM | POA: Diagnosis not present

## 2016-05-19 DIAGNOSIS — N186 End stage renal disease: Secondary | ICD-10-CM | POA: Diagnosis not present

## 2016-05-19 DIAGNOSIS — D689 Coagulation defect, unspecified: Secondary | ICD-10-CM | POA: Diagnosis not present

## 2016-05-19 DIAGNOSIS — D631 Anemia in chronic kidney disease: Secondary | ICD-10-CM | POA: Diagnosis not present

## 2016-05-19 DIAGNOSIS — E8779 Other fluid overload: Secondary | ICD-10-CM | POA: Diagnosis not present

## 2016-05-19 DIAGNOSIS — Z23 Encounter for immunization: Secondary | ICD-10-CM | POA: Diagnosis not present

## 2016-05-19 DIAGNOSIS — N2581 Secondary hyperparathyroidism of renal origin: Secondary | ICD-10-CM | POA: Diagnosis not present

## 2016-05-21 ENCOUNTER — Ambulatory Visit (HOSPITAL_COMMUNITY)
Admission: RE | Admit: 2016-05-21 | Discharge: 2016-05-21 | Disposition: A | Discharge status: Home / Self Care | Payer: Medicare Other | Source: Ambulatory Visit | Attending: Vascular Surgery | Admitting: Vascular Surgery

## 2016-05-21 ENCOUNTER — Encounter (HOSPITAL_COMMUNITY)
Admission: RE | Disposition: A | Discharge status: Home / Self Care | Payer: Self-pay | Source: Ambulatory Visit | Attending: Vascular Surgery

## 2016-05-21 ENCOUNTER — Encounter (HOSPITAL_COMMUNITY): Payer: Self-pay | Admitting: *Deleted

## 2016-05-21 DIAGNOSIS — Z6830 Body mass index (BMI) 30.0-30.9, adult: Secondary | ICD-10-CM | POA: Insufficient documentation

## 2016-05-21 DIAGNOSIS — M069 Rheumatoid arthritis, unspecified: Secondary | ICD-10-CM | POA: Insufficient documentation

## 2016-05-21 DIAGNOSIS — Z91041 Radiographic dye allergy status: Secondary | ICD-10-CM | POA: Insufficient documentation

## 2016-05-21 DIAGNOSIS — M810 Age-related osteoporosis without current pathological fracture: Secondary | ICD-10-CM | POA: Insufficient documentation

## 2016-05-21 DIAGNOSIS — Z833 Family history of diabetes mellitus: Secondary | ICD-10-CM | POA: Diagnosis not present

## 2016-05-21 DIAGNOSIS — Z8249 Family history of ischemic heart disease and other diseases of the circulatory system: Secondary | ICD-10-CM | POA: Insufficient documentation

## 2016-05-21 DIAGNOSIS — E039 Hypothyroidism, unspecified: Secondary | ICD-10-CM | POA: Insufficient documentation

## 2016-05-21 DIAGNOSIS — D631 Anemia in chronic kidney disease: Secondary | ICD-10-CM | POA: Diagnosis not present

## 2016-05-21 DIAGNOSIS — I509 Heart failure, unspecified: Secondary | ICD-10-CM | POA: Diagnosis not present

## 2016-05-21 DIAGNOSIS — E663 Overweight: Secondary | ICD-10-CM | POA: Insufficient documentation

## 2016-05-21 DIAGNOSIS — N186 End stage renal disease: Secondary | ICD-10-CM | POA: Insufficient documentation

## 2016-05-21 DIAGNOSIS — Y832 Surgical operation with anastomosis, bypass or graft as the cause of abnormal reaction of the patient, or of later complication, without mention of misadventure at the time of the procedure: Secondary | ICD-10-CM | POA: Diagnosis not present

## 2016-05-21 DIAGNOSIS — T82898A Other specified complication of vascular prosthetic devices, implants and grafts, initial encounter: Secondary | ICD-10-CM | POA: Insufficient documentation

## 2016-05-21 DIAGNOSIS — I132 Hypertensive heart and chronic kidney disease with heart failure and with stage 5 chronic kidney disease, or end stage renal disease: Secondary | ICD-10-CM | POA: Insufficient documentation

## 2016-05-21 DIAGNOSIS — E11621 Type 2 diabetes mellitus with foot ulcer: Secondary | ICD-10-CM | POA: Insufficient documentation

## 2016-05-21 DIAGNOSIS — E1122 Type 2 diabetes mellitus with diabetic chronic kidney disease: Secondary | ICD-10-CM | POA: Diagnosis not present

## 2016-05-21 HISTORY — PX: PERIPHERAL VASCULAR CATHETERIZATION: SHX172C

## 2016-05-21 LAB — POCT I-STAT, CHEM 8
BUN: 55 mg/dL — ABNORMAL HIGH (ref 6–20)
Calcium, Ion: 1.13 mmol/L — ABNORMAL LOW (ref 1.15–1.40)
Chloride: 93 mmol/L — ABNORMAL LOW (ref 101–111)
Creatinine, Ser: 7 mg/dL — ABNORMAL HIGH (ref 0.44–1.00)
Glucose, Bld: 213 mg/dL — ABNORMAL HIGH (ref 65–99)
HEMATOCRIT: 37 % (ref 36.0–46.0)
HEMOGLOBIN: 12.6 g/dL (ref 12.0–15.0)
POTASSIUM: 5.3 mmol/L — AB (ref 3.5–5.1)
SODIUM: 136 mmol/L (ref 135–145)
TCO2: 27 mmol/L (ref 0–100)

## 2016-05-21 SURGERY — A/V SHUNTOGRAM/FISTULAGRAM
Anesthesia: LOCAL | Laterality: Left

## 2016-05-21 MED ORDER — SODIUM CHLORIDE 0.9% FLUSH: 3.0000 mL | Freq: Two times a day (BID) | INTRAVENOUS | Status: DC

## 2016-05-21 MED ORDER — LIDOCAINE HCL (PF) 1 % IJ SOLN
INTRAMUSCULAR | Status: DC | PRN
  Administered 2016-05-21: 2 mL

## 2016-05-21 MED ORDER — HEPARIN (PORCINE) IN NACL 2-0.9 UNIT/ML-% IJ SOLN
INTRAMUSCULAR | Status: AC
  Filled 2016-05-21: qty 500

## 2016-05-21 MED ORDER — SODIUM CHLORIDE 0.9 % IV SOLN: 250.0000 mL | INTRAVENOUS | Status: DC | PRN

## 2016-05-21 MED ORDER — LIDOCAINE HCL (PF) 1 % IJ SOLN
INTRAMUSCULAR | Status: AC
  Filled 2016-05-21: qty 30

## 2016-05-21 MED ORDER — HEPARIN (PORCINE) IN NACL 2-0.9 UNIT/ML-% IJ SOLN
INTRAMUSCULAR | Status: DC | PRN
  Administered 2016-05-21: 500 mL

## 2016-05-21 MED ORDER — SODIUM CHLORIDE 0.9% FLUSH: 3.0000 mL | INTRAVENOUS | Status: DC | PRN

## 2016-05-21 MED ORDER — IODIXANOL 320 MG/ML IV SOLN
INTRAVENOUS | Status: DC | PRN
  Administered 2016-05-21: 40 mL via INTRAVENOUS

## 2016-05-21 SURGICAL SUPPLY — 10 items
BAG SNAP BAND KOVER 36X36 (MISCELLANEOUS) ×2 IMPLANT
COVER DOME SNAP 22 D (MISCELLANEOUS) ×2 IMPLANT
COVER PRB 48X5XTLSCP FOLD TPE (BAG) ×1 IMPLANT
COVER PROBE 5X48 (BAG) ×1
KIT MICROINTRODUCER STIFF 5F (SHEATH) ×2 IMPLANT
PROTECTION STATION PRESSURIZED (MISCELLANEOUS) ×2
STATION PROTECTION PRESSURIZED (MISCELLANEOUS) ×1 IMPLANT
STOPCOCK MORSE 400PSI 3WAY (MISCELLANEOUS) ×2 IMPLANT
TRAY PV CATH (CUSTOM PROCEDURE TRAY) ×2 IMPLANT
TUBING CIL FLEX 10 FLL-RA (TUBING) ×2 IMPLANT

## 2016-05-21 NOTE — Op Note (Signed)
   PATIENT: [name]   MRN: [id number] DOB: February 08, 1932    DATE OF PROCEDURE: 05/21/2016  INDICATIONS: [name] is a 82 y.o. female with a poorly functioning left brachiocephalic AV fistula. She comes in for a fistulogram in order to plan revision of the fistula. By duplex the fistula was very tortuous just above the antecubital level it was also one large competing branch.  PROCEDURE:  1. Ultrasound-guided access to left brachiocephalic fistula 2. Fistulogram left brachiocephalic AV fistula  SURGEON: Di Kindle. Edilia Bo, MD, FACS  ANESTHESIA: local   EBL: mminimal  TECHNIQUE: The patient was taken to the peripheral vascular lab. The left arm was prepped and draped in usual sterile fashion. Under ultrasound guidance, after the skin was anesthetized, the fistula was cannulated with a micropuncture needle and a micropuncture sheath introduced over a wire. A fistulogram was then obtained evaluating the fistula from the cannulation point to include the Central veins.  Next a blood pressure cuff was inflated in the upper arm and a fistulogram was obtained to evaluate the arterial anastomosis. At the completion suture was placed around the cannulation site  And there was good hemostasis.  FINDINGS: 1. The proximal fistula just above the antecubital level is tortuous. 2. There is a large competing branch  Just above the antecubital level. 3. The fistula above the branch is widely patent with no significant problems identified. 4. There is no central venous stenosis.  CLINICAL NOTE: The patient will be scheduled for ligation of the large competing branch in her left brachiocephalic AV fistula. She dialyzes on Tuesdays Thursdays and Saturdays and we will try to arrange this on a Friday.  [name], MD, FACS [facility] Specialists of [facility]  DATE OF DICTATION:   05/21/2016

## 2016-05-21 NOTE — Interval H&P Note (Signed)
History and Physical Interval Note:  05/21/2016 7:30 AM  [name]  has presented today for surgery, with the diagnosis of poor blood flow through fistula  The various methods of treatment have been discussed with the patient and family. After consideration of risks, benefits and other options for treatment, the patient has consented to  Procedure(s): A/V /Fistulagram (N/A) as a surgical intervention .  The patient's history has been reviewed, patient examined, no change in status, stable for surgery.  I have reviewed the patient's chart and labs.  Questions were answered to the patient's satisfaction.     [name]

## 2016-05-21 NOTE — H&P (View-Only) (Signed)
Patient name: [name] MRN: [id number] DOB: 1932/09/06 Sex: female  HPI: [name] is a 82 y.o. female, who returns today for follow up on her left AV fistula.    She was last seen on 03/16/2016 secondary to cannulation problems of her fistula.  She had significant swelling and was asked to rest it for 6 weeks.  She currently has a working left IJ tunneled catheter.    She is here today for AV fistula check and duplex scan.  She reports no medical changes since her last visit.    Past Medical History:  Diagnosis Date  . Anemia associated with chronic renal failure    ESA therapy  . CHF (congestive heart failure) (HCC)     preserved left ventricular systolic function  . CKD (chronic kidney disease) stage 4, GFR 15-29 ml/min (HCC)    [name] 02/16/2013  . Diabetic foot ulcers (HCC)    "get them on borh feet" (02/16/2013)  . History of blood transfusion 1975  . Hypertension   . Hypothyroid   . Osteoporosis   . Overweight(278.02)   . Pneumonia 02/2011   [name] 02/20/2011 (02/16/2013)  . Rheumatoid arthritis (HCC)   . Shortness of breath dyspnea    with exertion  . Type II diabetes mellitus (HCC)    patient denies  . Vaginal pessary present ~ 2012   Past Surgical History:  Procedure Laterality Date  . AV FISTULA PLACEMENT Left 01/26/2015   Procedure: LEFT ARM ARTERIOVENOUS (AV) FISTULA CREATION;  Surgeon: [name], MD;  Location: [facility] OR;  Service: Vascular;  Laterality: Left;  . CATARACT EXTRACTION W/ INTRAOCULAR LENS  IMPLANT, BILATERAL Bilateral 1990's  . ORIF FEMUR FRACTURE Left 1975   "got a rod and screw in it" (02/16/2013)    Family History  Problem Relation Age of Onset  . Heart disease Mother     before age 66  . Diabetes Father   . Stroke Other   . Heart disease Other   . Kidney disease Other   . Cancer Daughter     cervical  . Heart disease Daughter     before age 52  . Cancer Grandchild     lymph nodes  . Heart disease Sister     before age 9    . Heart disease Brother     before age 23    SOCIAL HISTORY: Social History   Social History  . Marital status: Divorced    Spouse name: N/A  . Number of children: N/A  . Years of education: N/A   Occupational History  . Retired - [employer]    Social History Main Topics  . Smoking status: Never Smoker  . Smokeless tobacco: Never Used  . Alcohol use No  . Drug use: No  . Sexual activity: No   Other Topics Concern  . Not on file   Social History Narrative   Divorced   No regular exercise    Allergies  Allergen Reactions  . Iodinated Diagnostic Agents Other (See Comments)    REACTION:  unknown  . Lipitor [Atorvastatin]     Current Outpatient Prescriptions  Medication Sig Dispense Refill  . ALPRAZolam (XANAX) 0.5 MG tablet Take 0.5 mg by mouth daily as needed for anxiety.     . cholecalciferol (VITAMIN D) 400 units TABS tablet Take 400 Units by mouth.    . fish oil-omega-3 fatty acids 1000 MG capsule Take 1 capsule by mouth daily.      Marland Kitchen  levothyroxine (SYNTHROID, LEVOTHROID) 125 MCG tablet Take 125 mcg by mouth daily before breakfast.     . sevelamer carbonate (RENVELA) 800 MG tablet Take 800 mg by mouth 3 (three) times daily with meals.     No current facility-administered medications for this visit.     ROS:   General:  No weight loss, Fever, chills  HEENT: No recent headaches, no nasal bleeding, no visual changes, no sore throat  Neurologic: No dizziness, blackouts, seizures. No recent symptoms of stroke or mini- stroke. No recent episodes of slurred speech, or temporary blindness.  Cardiac: No recent episodes of chest pain/pressure, no shortness of breath at rest.  No shortness of breath with exertion.  Denies history of atrial fibrillation or irregular heartbeat  Vascular: No history of rest pain in feet.  No history of claudication.  No history of non-healing ulcer, No history of DVT   Pulmonary: No home oxygen, no productive cough, no hemoptysis,   No asthma or wheezing  Musculoskeletal:  [ ]  Arthritis, [ ]  Low back pain,  [ ]  Joint pain  Hematologic:No history of hypercoagulable state.  No history of easy bleeding.  No history of anemia  Gastrointestinal: No hematochezia or melena,  No gastroesophageal reflux, no trouble swallowing  Urinary: [ ]  chronic Kidney disease, [ ]  on HD - [ ]  MWF or [ ]  TTHS, [ ]  Burning with urination, [ ]  Frequent urination, [ ]  Difficulty urinating;   Skin: No rashes  Psychological: No history of anxiety,  No history of depression   Physical Examination  Vitals:   05/09/16 1345  BP: 134/67  Pulse: 71  SpO2: 99%  Weight: 188 lb (85.3 kg)  Height: 5\' 6"  (1.676 m)    Body mass index is 30.34 kg/m.  General:  Alert and oriented, no acute distress HEENT: Normal Neck: No bruit or JVD Pulmonary: Clear to auscultation bilaterally Cardiac: Regular Rate and Rhythm without murmur Abdomen: Soft, non-tender, non-distended, no mass, no scars Skin: No rash Extremity Pulses:  2+ radial, brachial,Excelent thrill in the distal av fistula, but more proximal the thrill is hard to locate and weaker with a pulse at the proximal half Musculoskeletal: No deformity or edema  Neurologic: Upper and lower extremity motor 5/5 and symmetric  DATA:  Left brachialcephalic fistula duplex There is an area of concern measuring 1.9 cm in diameter at the distal brachial vein  ASSESSMENT:   ESRD Malfunctioning left brachiocephalic av fistula  PLAN:   At this time we recommend AV fistulogram Mon. 11th 2017 by Dr. Edilia Bo to better delineate the areas of concern.   The patient agrees to proceed.  She does have a contrast dye allergy and will need to be pre medicated. She will continue to use the catheter for HD.  She has HD T-TH-Sat in [place].   [place], [place] [facility] PA-C [facility]  The patient was seen in conjunction with Dr. Edilia Bo today  I have interviewed the patient and  examined the patient. I agree with the findings by the PA. The fistula has a good thrill just above the antecubital level but after that is very difficult to follow. In addition, the vein is very tortuous. I do not think the fistula can be used at this point and she is previously had problems with a significant infiltrate. I have recommended that we proceed with a fistulogram before planning possible surgical revision. One option would be to mobilize the vein to reduce the redundancy. She  does have a dye allergy and will need to be premedicated. This has been scheduled for 05/21/2016.  Cari Caraway, MD [phone]

## 2016-05-21 NOTE — Discharge Instructions (Signed)
Fistulogram, Care After °Refer to this sheet in the next few weeks. These instructions provide you with information on caring for yourself after your procedure. Your health care provider may also give you more specific instructions. Your treatment has been planned according to current medical practices, but problems sometimes occur. Call your health care provider if you have any problems or questions after your procedure. °WHAT TO EXPECT AFTER THE PROCEDURE °After your procedure, it is typical to have the following: °· A small amount of discomfort in the area where the catheters were placed. °· A small amount of bruising around the fistula. °· Sleepiness and fatigue. °HOME CARE INSTRUCTIONS °· Rest at home for the day following your procedure. °· Do not drive or operate heavy machinery while taking pain medicine. °· Take medicines only as directed by your health care provider. °· Do not take baths, swim, or use a hot tub until your health care provider approves. You may shower 24 hours after the procedure or as directed by your health care provider. °· There are many different ways to close and cover an incision, including stitches, skin glue, and adhesive strips. Follow your health care provider's instructions on: °¨ Incision care. °¨ Bandage (dressing) changes and removal. °¨ Incision closure removal. °· Monitor your dialysis fistula carefully. °SEEK MEDICAL CARE IF: °· You have drainage, redness, swelling, or pain at your catheter site. °· You have a fever. °· You have chills. °SEEK IMMEDIATE MEDICAL CARE IF: °· You feel weak. °· You have trouble balancing. °· You have trouble moving your arms or legs. °· You have problems with your speech or vision. °· You can no longer feel a vibration or buzz when you put your fingers over your dialysis fistula. °· The limb that was used for the procedure: °¨ Swells. °¨ Is painful. °¨ Is cold. °¨ Is discolored, such as blue or pale white. °  °This information is not intended  to replace advice given to you by your health care provider. Make sure you discuss any questions you have with your health care provider. °  °Document Released: 01/11/2014 Document Reviewed: 01/11/2014 °Elsevier Interactive Patient Education ©2016 [employer] Inc[employer] ° °

## 2016-05-22 ENCOUNTER — Other Ambulatory Visit: Payer: Self-pay

## 2016-05-22 DIAGNOSIS — E8779 Other fluid overload: Secondary | ICD-10-CM | POA: Diagnosis not present

## 2016-05-22 DIAGNOSIS — E1129 Type 2 diabetes mellitus with other diabetic kidney complication: Secondary | ICD-10-CM | POA: Diagnosis not present

## 2016-05-22 DIAGNOSIS — D689 Coagulation defect, unspecified: Secondary | ICD-10-CM | POA: Diagnosis not present

## 2016-05-22 DIAGNOSIS — D631 Anemia in chronic kidney disease: Secondary | ICD-10-CM | POA: Diagnosis not present

## 2016-05-22 DIAGNOSIS — N2581 Secondary hyperparathyroidism of renal origin: Secondary | ICD-10-CM | POA: Diagnosis not present

## 2016-05-22 DIAGNOSIS — Z23 Encounter for immunization: Secondary | ICD-10-CM | POA: Diagnosis not present

## 2016-05-22 DIAGNOSIS — N186 End stage renal disease: Secondary | ICD-10-CM | POA: Diagnosis not present

## 2016-05-22 NOTE — Progress Notes (Signed)
Patient ID: [name], female   DOB: October 27, 1931, 82 y.o.   MRN: [id number] Patient ID: [name], female   DOB: Aug 19, 1932, 82 y.o.   MRN: [id number] Chief Complaint  Patient presents with  . check/ clean pessary    Blood pressure (!) 130/50, pulse 74, weight 195 lb (88.5 kg).  [name] presents today for routine follow up related to her pessary.   She was fit last week for a Milex ring with support #5 after increased mucosal irritation from a [employer] She reports no vaginal discharge or vaginal bleeding.  Exam reveals no undue vaginal mucosal pressure of breakdown, no discharge and no vaginal bleeding.  The pessary is  replaced without difficulty.    [name] will be sen back in 4 months for continued follow up.  Lazaro Arms, MD   05/22/2016 8:21 PM

## 2016-05-23 DIAGNOSIS — E8779 Other fluid overload: Secondary | ICD-10-CM | POA: Diagnosis not present

## 2016-05-23 DIAGNOSIS — E1129 Type 2 diabetes mellitus with other diabetic kidney complication: Secondary | ICD-10-CM | POA: Diagnosis not present

## 2016-05-23 DIAGNOSIS — D631 Anemia in chronic kidney disease: Secondary | ICD-10-CM | POA: Diagnosis not present

## 2016-05-23 DIAGNOSIS — N186 End stage renal disease: Secondary | ICD-10-CM | POA: Diagnosis not present

## 2016-05-23 DIAGNOSIS — Z23 Encounter for immunization: Secondary | ICD-10-CM | POA: Diagnosis not present

## 2016-05-23 DIAGNOSIS — D689 Coagulation defect, unspecified: Secondary | ICD-10-CM | POA: Diagnosis not present

## 2016-05-23 DIAGNOSIS — N2581 Secondary hyperparathyroidism of renal origin: Secondary | ICD-10-CM | POA: Diagnosis not present

## 2016-05-24 DIAGNOSIS — D689 Coagulation defect, unspecified: Secondary | ICD-10-CM | POA: Diagnosis not present

## 2016-05-24 DIAGNOSIS — N186 End stage renal disease: Secondary | ICD-10-CM | POA: Diagnosis not present

## 2016-05-24 DIAGNOSIS — D631 Anemia in chronic kidney disease: Secondary | ICD-10-CM | POA: Diagnosis not present

## 2016-05-24 DIAGNOSIS — N2581 Secondary hyperparathyroidism of renal origin: Secondary | ICD-10-CM | POA: Diagnosis not present

## 2016-05-24 DIAGNOSIS — E1129 Type 2 diabetes mellitus with other diabetic kidney complication: Secondary | ICD-10-CM | POA: Diagnosis not present

## 2016-05-24 DIAGNOSIS — E8779 Other fluid overload: Secondary | ICD-10-CM | POA: Diagnosis not present

## 2016-05-24 DIAGNOSIS — Z23 Encounter for immunization: Secondary | ICD-10-CM | POA: Diagnosis not present

## 2016-05-26 DIAGNOSIS — N2581 Secondary hyperparathyroidism of renal origin: Secondary | ICD-10-CM | POA: Diagnosis not present

## 2016-05-26 DIAGNOSIS — E8779 Other fluid overload: Secondary | ICD-10-CM | POA: Diagnosis not present

## 2016-05-26 DIAGNOSIS — Z23 Encounter for immunization: Secondary | ICD-10-CM | POA: Diagnosis not present

## 2016-05-26 DIAGNOSIS — N186 End stage renal disease: Secondary | ICD-10-CM | POA: Diagnosis not present

## 2016-05-26 DIAGNOSIS — D631 Anemia in chronic kidney disease: Secondary | ICD-10-CM | POA: Diagnosis not present

## 2016-05-26 DIAGNOSIS — E1129 Type 2 diabetes mellitus with other diabetic kidney complication: Secondary | ICD-10-CM | POA: Diagnosis not present

## 2016-05-26 DIAGNOSIS — D689 Coagulation defect, unspecified: Secondary | ICD-10-CM | POA: Diagnosis not present

## 2016-05-29 DIAGNOSIS — E8779 Other fluid overload: Secondary | ICD-10-CM | POA: Diagnosis not present

## 2016-05-29 DIAGNOSIS — Z23 Encounter for immunization: Secondary | ICD-10-CM | POA: Diagnosis not present

## 2016-05-29 DIAGNOSIS — D631 Anemia in chronic kidney disease: Secondary | ICD-10-CM | POA: Diagnosis not present

## 2016-05-29 DIAGNOSIS — E1129 Type 2 diabetes mellitus with other diabetic kidney complication: Secondary | ICD-10-CM | POA: Diagnosis not present

## 2016-05-29 DIAGNOSIS — N2581 Secondary hyperparathyroidism of renal origin: Secondary | ICD-10-CM | POA: Diagnosis not present

## 2016-05-29 DIAGNOSIS — N186 End stage renal disease: Secondary | ICD-10-CM | POA: Diagnosis not present

## 2016-05-29 DIAGNOSIS — D689 Coagulation defect, unspecified: Secondary | ICD-10-CM | POA: Diagnosis not present

## 2016-05-31 ENCOUNTER — Encounter (HOSPITAL_COMMUNITY): Payer: Self-pay | Admitting: *Deleted

## 2016-05-31 DIAGNOSIS — E1129 Type 2 diabetes mellitus with other diabetic kidney complication: Secondary | ICD-10-CM | POA: Diagnosis not present

## 2016-05-31 DIAGNOSIS — Z23 Encounter for immunization: Secondary | ICD-10-CM | POA: Diagnosis not present

## 2016-05-31 DIAGNOSIS — E8779 Other fluid overload: Secondary | ICD-10-CM | POA: Diagnosis not present

## 2016-05-31 DIAGNOSIS — D689 Coagulation defect, unspecified: Secondary | ICD-10-CM | POA: Diagnosis not present

## 2016-05-31 DIAGNOSIS — D631 Anemia in chronic kidney disease: Secondary | ICD-10-CM | POA: Diagnosis not present

## 2016-05-31 DIAGNOSIS — N186 End stage renal disease: Secondary | ICD-10-CM | POA: Diagnosis not present

## 2016-05-31 DIAGNOSIS — N2581 Secondary hyperparathyroidism of renal origin: Secondary | ICD-10-CM | POA: Diagnosis not present

## 2016-05-31 NOTE — Progress Notes (Signed)
Pt denies SOB, chest pain, and being under the care of a cardiologist. Pt denies having a stress test and cardiac cath. Pt denies having a chest x ray within the last yaer. Pt made aware to stop taking otc vitamins, fish oil, herbal medications and NSAIDs ie: Ibuprofen, Advil, Naproxen, BC and Goody Powder. Pt stated that she does not take medication for diabetes. Pt verbalized understanding of all pre-op instructions.

## 2016-06-01 ENCOUNTER — Encounter (HOSPITAL_COMMUNITY)
Admission: RE | Disposition: A | Discharge status: Home / Self Care | Payer: Self-pay | Source: Ambulatory Visit | Attending: Vascular Surgery

## 2016-06-01 ENCOUNTER — Ambulatory Visit (HOSPITAL_COMMUNITY): Payer: Medicare Other | Admitting: Anesthesiology

## 2016-06-01 ENCOUNTER — Ambulatory Visit (HOSPITAL_COMMUNITY)
Admission: RE | Admit: 2016-06-01 | Discharge: 2016-06-01 | Disposition: A | Discharge status: Home / Self Care | Payer: Medicare Other | Source: Ambulatory Visit | Attending: Vascular Surgery | Admitting: Vascular Surgery

## 2016-06-01 ENCOUNTER — Telehealth: Payer: Self-pay | Admitting: Vascular Surgery

## 2016-06-01 ENCOUNTER — Encounter (HOSPITAL_COMMUNITY): Payer: Self-pay | Admitting: Anesthesiology

## 2016-06-01 DIAGNOSIS — I132 Hypertensive heart and chronic kidney disease with heart failure and with stage 5 chronic kidney disease, or end stage renal disease: Secondary | ICD-10-CM | POA: Diagnosis not present

## 2016-06-01 DIAGNOSIS — T82898A Other specified complication of vascular prosthetic devices, implants and grafts, initial encounter: Secondary | ICD-10-CM | POA: Diagnosis not present

## 2016-06-01 DIAGNOSIS — Z91041 Radiographic dye allergy status: Secondary | ICD-10-CM | POA: Diagnosis not present

## 2016-06-01 DIAGNOSIS — E039 Hypothyroidism, unspecified: Secondary | ICD-10-CM | POA: Insufficient documentation

## 2016-06-01 DIAGNOSIS — Z823 Family history of stroke: Secondary | ICD-10-CM | POA: Diagnosis not present

## 2016-06-01 DIAGNOSIS — I509 Heart failure, unspecified: Secondary | ICD-10-CM | POA: Diagnosis not present

## 2016-06-01 DIAGNOSIS — M810 Age-related osteoporosis without current pathological fracture: Secondary | ICD-10-CM | POA: Diagnosis not present

## 2016-06-01 DIAGNOSIS — Z833 Family history of diabetes mellitus: Secondary | ICD-10-CM | POA: Diagnosis not present

## 2016-06-01 DIAGNOSIS — E1122 Type 2 diabetes mellitus with diabetic chronic kidney disease: Secondary | ICD-10-CM | POA: Insufficient documentation

## 2016-06-01 DIAGNOSIS — Z6830 Body mass index (BMI) 30.0-30.9, adult: Secondary | ICD-10-CM | POA: Diagnosis not present

## 2016-06-01 DIAGNOSIS — D631 Anemia in chronic kidney disease: Secondary | ICD-10-CM | POA: Insufficient documentation

## 2016-06-01 DIAGNOSIS — F419 Anxiety disorder, unspecified: Secondary | ICD-10-CM | POA: Diagnosis not present

## 2016-06-01 DIAGNOSIS — E663 Overweight: Secondary | ICD-10-CM | POA: Insufficient documentation

## 2016-06-01 DIAGNOSIS — Z8249 Family history of ischemic heart disease and other diseases of the circulatory system: Secondary | ICD-10-CM | POA: Insufficient documentation

## 2016-06-01 DIAGNOSIS — M069 Rheumatoid arthritis, unspecified: Secondary | ICD-10-CM | POA: Insufficient documentation

## 2016-06-01 DIAGNOSIS — N186 End stage renal disease: Secondary | ICD-10-CM | POA: Diagnosis not present

## 2016-06-01 DIAGNOSIS — T82510A Breakdown (mechanical) of surgically created arteriovenous fistula, initial encounter: Secondary | ICD-10-CM | POA: Diagnosis present

## 2016-06-01 DIAGNOSIS — Y832 Surgical operation with anastomosis, bypass or graft as the cause of abnormal reaction of the patient, or of later complication, without mention of misadventure at the time of the procedure: Secondary | ICD-10-CM | POA: Diagnosis not present

## 2016-06-01 HISTORY — DX: Cardiac murmur, unspecified: R01.1

## 2016-06-01 HISTORY — PX: LIGATION OF COMPETING BRANCHES OF ARTERIOVENOUS FISTULA: SHX5949

## 2016-06-01 HISTORY — DX: Headache: R51

## 2016-06-01 HISTORY — DX: Headache, unspecified: R51.9

## 2016-06-01 LAB — POCT I-STAT 4, (NA,K, GLUC, HGB,HCT)
GLUCOSE: 137 mg/dL — AB (ref 65–99)
HEMATOCRIT: 33 % — AB (ref 36.0–46.0)
HEMOGLOBIN: 11.2 g/dL — AB (ref 12.0–15.0)
POTASSIUM: 4.1 mmol/L (ref 3.5–5.1)
SODIUM: 141 mmol/L (ref 135–145)

## 2016-06-01 LAB — GLUCOSE, CAPILLARY
GLUCOSE-CAPILLARY: 152 mg/dL — AB (ref 65–99)
Glucose-Capillary: 133 mg/dL — ABNORMAL HIGH (ref 65–99)

## 2016-06-01 SURGERY — LIGATION OF COMPETING BRANCHES OF ARTERIOVENOUS FISTULA
Anesthesia: Monitor Anesthesia Care | Site: Arm Upper | Laterality: Left

## 2016-06-01 MED ORDER — CHLORHEXIDINE GLUCONATE CLOTH 2 % EX PADS: 6.0000 | MEDICATED_PAD | Freq: Once | CUTANEOUS | Status: DC

## 2016-06-01 MED ORDER — FENTANYL CITRATE (PF) 100 MCG/2ML IJ SOLN
INTRAMUSCULAR | Status: AC
  Filled 2016-06-01: qty 2

## 2016-06-01 MED ORDER — LIDOCAINE HCL (CARDIAC) 20 MG/ML IV SOLN
INTRAVENOUS | Status: DC | PRN
  Administered 2016-06-01: 40 mg via INTRAVENOUS

## 2016-06-01 MED ORDER — LIDOCAINE-EPINEPHRINE (PF) 1 %-1:200000 IJ SOLN
INTRAMUSCULAR | Status: DC | PRN
  Administered 2016-06-01: 30 mL

## 2016-06-01 MED ORDER — FENTANYL CITRATE (PF) 100 MCG/2ML IJ SOLN
INTRAMUSCULAR | Status: DC | PRN
  Administered 2016-06-01 (×2): 25 ug via INTRAVENOUS

## 2016-06-01 MED ORDER — PROPOFOL 500 MG/50ML IV EMUL
INTRAVENOUS | Status: DC | PRN
  Administered 2016-06-01: 50 ug/kg/min via INTRAVENOUS

## 2016-06-01 MED ORDER — 0.9 % SODIUM CHLORIDE (POUR BTL) OPTIME
TOPICAL | Status: DC | PRN
  Administered 2016-06-01: 1000 mL

## 2016-06-01 MED ORDER — EPHEDRINE SULFATE 50 MG/ML IJ SOLN
INTRAMUSCULAR | Status: DC | PRN
  Administered 2016-06-01: 5 mg via INTRAVENOUS
  Administered 2016-06-01: 10 mg via INTRAVENOUS

## 2016-06-01 MED ORDER — EPHEDRINE 5 MG/ML INJ
INTRAVENOUS | Status: AC
  Filled 2016-06-01: qty 10

## 2016-06-01 MED ORDER — SODIUM CHLORIDE 0.9 % IV SOLN
INTRAVENOUS | Status: DC | PRN
  Administered 2016-06-01: 07:00:00 via INTRAVENOUS

## 2016-06-01 MED ORDER — MIDAZOLAM HCL 2 MG/2ML IJ SOLN
INTRAMUSCULAR | Status: AC
  Filled 2016-06-01: qty 2

## 2016-06-01 MED ORDER — DEXTROSE 5 % IV SOLN
1.5000 g | INTRAVENOUS | Status: AC
  Administered 2016-06-01: 1.5 g via INTRAVENOUS

## 2016-06-01 MED ORDER — PHENYLEPHRINE HCL 10 MG/ML IJ SOLN
INTRAMUSCULAR | Status: DC | PRN
  Administered 2016-06-01: 80 ug via INTRAVENOUS
  Administered 2016-06-01: 40 ug via INTRAVENOUS
  Administered 2016-06-01: 80 ug via INTRAVENOUS
  Administered 2016-06-01: 40 ug via INTRAVENOUS
  Administered 2016-06-01 (×2): 80 ug via INTRAVENOUS

## 2016-06-01 MED ORDER — HEPARIN SODIUM (PORCINE) 1000 UNIT/ML IJ SOLN
INTRAMUSCULAR | Status: AC
  Filled 2016-06-01: qty 1

## 2016-06-01 MED ORDER — PHENYLEPHRINE 40 MCG/ML (10ML) SYRINGE FOR IV PUSH (FOR BLOOD PRESSURE SUPPORT)
PREFILLED_SYRINGE | INTRAVENOUS | Status: AC
  Filled 2016-06-01: qty 10

## 2016-06-01 MED ORDER — MIDAZOLAM HCL 5 MG/5ML IJ SOLN
INTRAMUSCULAR | Status: DC | PRN
  Administered 2016-06-01: 1 mg via INTRAVENOUS

## 2016-06-01 MED ORDER — LIDOCAINE-EPINEPHRINE (PF) 1 %-1:200000 IJ SOLN
INTRAMUSCULAR | Status: AC
  Filled 2016-06-01: qty 30

## 2016-06-01 MED ORDER — LIDOCAINE 2% (20 MG/ML) 5 ML SYRINGE
INTRAMUSCULAR | Status: AC
Start: 2016-06-01 — End: 2016-06-01
  Filled 2016-06-01: qty 5

## 2016-06-01 MED ORDER — PROPOFOL 10 MG/ML IV BOLUS
INTRAVENOUS | Status: AC
  Filled 2016-06-01: qty 20

## 2016-06-01 MED ORDER — THROMBIN 20000 UNITS EX SOLR
CUTANEOUS | Status: AC
  Filled 2016-06-01: qty 20000

## 2016-06-01 MED ORDER — LIDOCAINE HCL (PF) 1 % IJ SOLN
INTRAMUSCULAR | Status: AC
  Filled 2016-06-01: qty 30

## 2016-06-01 MED ORDER — DEXTROSE 5 % IV SOLN
INTRAVENOUS | Status: AC
  Filled 2016-06-01: qty 1.5

## 2016-06-01 MED ORDER — OXYCODONE-ACETAMINOPHEN 5-325 MG PO TABS: 1.0000 | ORAL_TABLET | ORAL | 0 refills | Status: DC | PRN

## 2016-06-01 MED ORDER — SODIUM CHLORIDE 0.9 % IV SOLN: INTRAVENOUS | Status: DC

## 2016-06-01 SURGICAL SUPPLY — 39 items
ARMBAND PINK RESTRICT EXTREMIT (MISCELLANEOUS) ×3 IMPLANT
BANDAGE ACE 4X5 VEL STRL LF (GAUZE/BANDAGES/DRESSINGS) ×3 IMPLANT
BNDG GAUZE ELAST 4 BULKY (GAUZE/BANDAGES/DRESSINGS) ×3 IMPLANT
CANISTER SUCTION 2500CC (MISCELLANEOUS) ×3 IMPLANT
CLIP TI MEDIUM 6 (CLIP) ×3 IMPLANT
CLIP TI WIDE RED SMALL 6 (CLIP) ×3 IMPLANT
ELECT REM PT RETURN 9FT ADLT (ELECTROSURGICAL) ×3
ELECTRODE REM PT RTRN 9FT ADLT (ELECTROSURGICAL) ×1 IMPLANT
GAUZE SPONGE 4X4 12PLY STRL (GAUZE/BANDAGES/DRESSINGS) IMPLANT
GEL ULTRASOUND 20GR AQUASONIC (MISCELLANEOUS) IMPLANT
GLOVE BIO SURGEON STRL SZ7.5 (GLOVE) ×3 IMPLANT
GLOVE BIOGEL PI IND STRL 6.5 (GLOVE) ×1 IMPLANT
GLOVE BIOGEL PI IND STRL 7.0 (GLOVE) ×1 IMPLANT
GLOVE BIOGEL PI IND STRL 8 (GLOVE) ×1 IMPLANT
GLOVE BIOGEL PI INDICATOR 6.5 (GLOVE) ×2
GLOVE BIOGEL PI INDICATOR 7.0 (GLOVE) ×2
GLOVE BIOGEL PI INDICATOR 8 (GLOVE) ×2
GLOVE SURG SS PI 7.0 STRL IVOR (GLOVE) ×6 IMPLANT
GOWN STRL REUS W/ TWL LRG LVL3 (GOWN DISPOSABLE) ×2 IMPLANT
GOWN STRL REUS W/ TWL XL LVL3 (GOWN DISPOSABLE) ×1 IMPLANT
GOWN STRL REUS W/TWL LRG LVL3 (GOWN DISPOSABLE) ×4
GOWN STRL REUS W/TWL XL LVL3 (GOWN DISPOSABLE) ×2
KIT BASIN OR (CUSTOM PROCEDURE TRAY) ×3 IMPLANT
KIT ROOM TURNOVER OR (KITS) ×3 IMPLANT
LIQUID BAND (GAUZE/BANDAGES/DRESSINGS) ×3 IMPLANT
NS IRRIG 1000ML POUR BTL (IV SOLUTION) ×3 IMPLANT
PACK CV ACCESS (CUSTOM PROCEDURE TRAY) ×3 IMPLANT
PAD ARMBOARD 7.5X6 YLW CONV (MISCELLANEOUS) ×6 IMPLANT
SPONGE SURGIFOAM ABS GEL 100 (HEMOSTASIS) IMPLANT
SUT ETHILON 3 0 PS 1 (SUTURE) IMPLANT
SUT PROLENE 6 0 BV (SUTURE) IMPLANT
SUT SILK 0 TIES 10X30 (SUTURE) IMPLANT
SUT VIC AB 3-0 SH 27 (SUTURE) ×4
SUT VIC AB 3-0 SH 27X BRD (SUTURE) ×2 IMPLANT
SUT VICRYL 4-0 PS2 18IN ABS (SUTURE) ×3 IMPLANT
SWAB COLLECTION DEVICE MRSA (MISCELLANEOUS) IMPLANT
TUBE ANAEROBIC SPECIMEN COL (MISCELLANEOUS) IMPLANT
UNDERPAD 30X30 (UNDERPADS AND DIAPERS) ×3 IMPLANT
WATER STERILE IRR 1000ML POUR (IV SOLUTION) IMPLANT

## 2016-06-01 NOTE — Op Note (Signed)
    NAME: [name]   MRN: [id number] DOB: 06/08/1932    DATE OF OPERATION: 06/01/2016  PREOP DIAGNOSIS: Stage V chronic kidney disease  POSTOP DIAGNOSIS: Same  PROCEDURE:  1. Ligation of competing branches of left brachial cephalic AV fistula. 2. Superficializationof left brachiocephalic AV fistula   SURGEON: Di Kindle. Edilia Bo, MD, FACS  ASSIST: None  ANESTHESIA: Local with sedation minimal  INDICATIONS: [name] is a 82 y.o. female who has a left brachiocephalic fistula which has been difficult to access. Fistulogram showed a large competing branch. The fistula was also deep.  One large competing branch and several small competing branches were ligated.  TECHNIQUE: The patient was taken to the operating room and sedated by anesthesia. The left upper extremity was prepped and draped in usual sterile fashion. After the skin was anesthetized with 1% lidocaine, an incision was made over the proximal fistula. The fistula was dissected free and the large competing branch was identified. This was ligated and divided between 2-0 silk ties. The vein was then mobilized and adipose tissue was excised in both directions. A second incision was made further proximally after the skin was anesthetized. Through this incision several other small competing branches were ligated and divided. Again adipose tissue was excised in both directions and the vein was superficialized. Hemostasis was obtained in the wounds. I then closed the layer of 3-0 Vicryl beneath the fistula to bring it to the surface. Each of the incisions was then closed with 4-0 Vicryl. A sterile dressing was applied. The patient tolerated the procedure well and was transferred to the recovery room in stable condition. All needle and sponge counts were correct.   [name], MD, FACS [facility]  DATE OF DICTATION:   06/01/2016

## 2016-06-01 NOTE — Telephone Encounter (Signed)
-----   Message from Sharee Pimple, RN sent at 06/01/2016  9:28 AM EDT ----- Regarding: 4 weeks   ----- Message ----- From: Chuck Hint, MD Sent: 06/01/2016   9:12 AM To: Vvs Charge Pool Subject: charge and f/u                                 PROCEDURE:  1. Ligation of competing branches of left brachial cephalic AV fistula. 2. Superficializationof left brachiocephalic AV fistula   SURGEON: Di Kindle. Edilia Bo, MD, FACS  ASSIST: None  She will need a follow up visit in 4 weeks. She does not need a duplex. Thank you. CD

## 2016-06-01 NOTE — Transfer of Care (Signed)
Immediate Anesthesia Transfer of Care Note  Patient: [name]  Procedure(s) Performed: Procedure(s): LIGATION OF COMPETING BRANCHES OF BRACHIOCEPHALIC ARTERIOVENOUS FISTULA LEFT ARM (Left)  Patient Location: PACU  Anesthesia Type:MAC  Level of Consciousness: awake, alert , oriented and patient cooperative  Airway & Oxygen Therapy: Patient Spontanous Breathing and Patient connected to face mask oxygen  Post-op Assessment: Report given to RN and Post -op Vital signs reviewed and stable  Post vital signs: Reviewed  Last Vitals:  Vitals:   06/01/16 0703  BP: (!) 95/41  Pulse: (!) 58  Resp: 18  Temp: 36.9 C    Last Pain:  Vitals:   06/01/16 0703  TempSrc: Oral         Complications: No apparent anesthesia complications

## 2016-06-01 NOTE — H&P (View-Only) (Signed)
Patient name: [name] MRN: [id number] DOB: 1932/09/06 Sex: female  HPI: [name] is a 82 y.o. female, who returns today for follow up on her left AV fistula.    She was last seen on 03/16/2016 secondary to cannulation problems of her fistula.  She had significant swelling and was asked to rest it for 6 weeks.  She currently has a working left IJ tunneled catheter.    She is here today for AV fistula check and duplex scan.  She reports no medical changes since her last visit.    Past Medical History:  Diagnosis Date  . Anemia associated with chronic renal failure    ESA therapy  . CHF (congestive heart failure) (HCC)     preserved left ventricular systolic function  . CKD (chronic kidney disease) stage 4, GFR 15-29 ml/min (HCC)    [name] 02/16/2013  . Diabetic foot ulcers (HCC)    "get them on borh feet" (02/16/2013)  . History of blood transfusion 1975  . Hypertension   . Hypothyroid   . Osteoporosis   . Overweight(278.02)   . Pneumonia 02/2011   [name] 02/20/2011 (02/16/2013)  . Rheumatoid arthritis (HCC)   . Shortness of breath dyspnea    with exertion  . Type II diabetes mellitus (HCC)    patient denies  . Vaginal pessary present ~ 2012   Past Surgical History:  Procedure Laterality Date  . AV FISTULA PLACEMENT Left 01/26/2015   Procedure: LEFT ARM ARTERIOVENOUS (AV) FISTULA CREATION;  Surgeon: [name], MD;  Location: [facility] OR;  Service: Vascular;  Laterality: Left;  . CATARACT EXTRACTION W/ INTRAOCULAR LENS  IMPLANT, BILATERAL Bilateral 1990's  . ORIF FEMUR FRACTURE Left 1975   "got a rod and screw in it" (02/16/2013)    Family History  Problem Relation Age of Onset  . Heart disease Mother     before age 66  . Diabetes Father   . Stroke Other   . Heart disease Other   . Kidney disease Other   . Cancer Daughter     cervical  . Heart disease Daughter     before age 52  . Cancer Grandchild     lymph nodes  . Heart disease Sister     before age 9    . Heart disease Brother     before age 23    SOCIAL HISTORY: Social History   Social History  . Marital status: Divorced    Spouse name: N/A  . Number of children: N/A  . Years of education: N/A   Occupational History  . Retired - [employer]    Social History Main Topics  . Smoking status: Never Smoker  . Smokeless tobacco: Never Used  . Alcohol use No  . Drug use: No  . Sexual activity: No   Other Topics Concern  . Not on file   Social History Narrative   Divorced   No regular exercise    Allergies  Allergen Reactions  . Iodinated Diagnostic Agents Other (See Comments)    REACTION:  unknown  . Lipitor [Atorvastatin]     Current Outpatient Prescriptions  Medication Sig Dispense Refill  . ALPRAZolam (XANAX) 0.5 MG tablet Take 0.5 mg by mouth daily as needed for anxiety.     . cholecalciferol (VITAMIN D) 400 units TABS tablet Take 400 Units by mouth.    . fish oil-omega-3 fatty acids 1000 MG capsule Take 1 capsule by mouth daily.      Marland Kitchen  levothyroxine (SYNTHROID, LEVOTHROID) 125 MCG tablet Take 125 mcg by mouth daily before breakfast.     . sevelamer carbonate (RENVELA) 800 MG tablet Take 800 mg by mouth 3 (three) times daily with meals.     No current facility-administered medications for this visit.     ROS:   General:  No weight loss, Fever, chills  HEENT: No recent headaches, no nasal bleeding, no visual changes, no sore throat  Neurologic: No dizziness, blackouts, seizures. No recent symptoms of stroke or mini- stroke. No recent episodes of slurred speech, or temporary blindness.  Cardiac: No recent episodes of chest pain/pressure, no shortness of breath at rest.  No shortness of breath with exertion.  Denies history of atrial fibrillation or irregular heartbeat  Vascular: No history of rest pain in feet.  No history of claudication.  No history of non-healing ulcer, No history of DVT   Pulmonary: No home oxygen, no productive cough, no hemoptysis,   No asthma or wheezing  Musculoskeletal:  [ ] Arthritis, [ ] Low back pain,  [ ] Joint pain  Hematologic:No history of hypercoagulable state.  No history of easy bleeding.  No history of anemia  Gastrointestinal: No hematochezia or melena,  No gastroesophageal reflux, no trouble swallowing  Urinary: [ ] chronic Kidney disease, [ ] on HD - [ ] MWF or [ ] TTHS, [ ] Burning with urination, [ ] Frequent urination, [ ] Difficulty urinating;   Skin: No rashes  Psychological: No history of anxiety,  No history of depression   Physical Examination  Vitals:   05/09/16 1345  BP: 134/67  Pulse: 71  SpO2: 99%  Weight: 188 lb (85.3 kg)  Height: 5' 6" (1.676 m)    Body mass index is 30.34 kg/m.  General:  Alert and oriented, no acute distress HEENT: Normal Neck: No bruit or JVD Pulmonary: Clear to auscultation bilaterally Cardiac: Regular Rate and Rhythm without murmur Abdomen: Soft, non-tender, non-distended, no mass, no scars Skin: No rash Extremity Pulses:  2+ radial, brachial,Excelent thrill in the distal av fistula, but more proximal the thrill is hard to locate and weaker with a pulse at the proximal half Musculoskeletal: No deformity or edema  Neurologic: Upper and lower extremity motor 5/5 and symmetric  DATA:  Left brachialcephalic fistula duplex There is an area of concern measuring 1.9 cm in diameter at the distal brachial vein  ASSESSMENT:   ESRD Malfunctioning left brachiocephalic av fistula  PLAN:   At this time we recommend AV fistulogram Mon. 11th 2017 by Dr. Harnoor Kohles to better delineate the areas of concern.   The patient agrees to proceed.  She does have a contrast dye allergy and will need to be pre medicated. She will continue to use the catheter for HD.  She has HD T-TH-Sat in [place].   COLLINS, EMMA MAUREEN PA-C [facility] [place]  The patient was seen in conjunction with Dr. Kyrollos Cordell today  I have interviewed the patient and  examined the patient. I agree with the findings by the PA. The fistula has a good thrill just above the antecubital level but after that is very difficult to follow. In addition, the vein is very tortuous. I do not think the fistula can be used at this point and she is previously had problems with a significant infiltrate. I have recommended that we proceed with a fistulogram before planning possible surgical revision. One option would be to mobilize the vein to reduce the redundancy. She   does have a dye allergy and will need to be premedicated. This has been scheduled for 05/21/2016.  Cari Caraway, MD [phone]

## 2016-06-01 NOTE — Anesthesia Procedure Notes (Signed)
Procedure Name: MAC Date/Time: 06/01/2016 7:45 AM Performed by: Lovie Chol Pre-anesthesia Checklist: Patient identified, Emergency Drugs available, Suction available, Patient being monitored and Timeout performed Oxygen Delivery Method: Simple face mask

## 2016-06-01 NOTE — Interval H&P Note (Signed)
History and Physical Interval Note:  06/01/2016 6:57 AM  [name]  has presented today for surgery, with the diagnosis of End Stage Renal Disease N18.6  The various methods of treatment have been discussed with the patient and family. After consideration of risks, benefits and other options for treatment, the patient has consented to  Procedure(s): LIGATION OF COMPETING BRANCHES OF BRACHIOCEPHALIC ARTERIOVENOUS FISTULA (Left) as a surgical intervention .  The patient's history has been reviewed, patient examined, no change in status, stable for surgery.  I have reviewed the patient's chart and labs.  Questions were answered to the patient's satisfaction.     [name]

## 2016-06-01 NOTE — Telephone Encounter (Signed)
Tried to speak to family member to [facility] but they were not willing, so I will mail a letter for appt on 11/1 with Dr Edilia Bo

## 2016-06-01 NOTE — Anesthesia Preprocedure Evaluation (Addendum)
Anesthesia Evaluation  Patient identified by MRN, date of birth, ID band Patient awake    Reviewed: Allergy & Precautions, NPO status , Patient's Chart, lab work & pertinent test results  History of Anesthesia Complications Negative for: history of anesthetic complications  Airway Mallampati: II  TM Distance: >3 FB Neck ROM: Full    Dental  (+) Edentulous Upper, Edentulous Lower, Dental Advisory Given   Pulmonary    breath sounds clear to auscultation       Cardiovascular hypertension, Pt. on medications +CHF  + Valvular Problems/Murmurs  Rhythm:Regular  Heart murmur   Neuro/Psych Anxiety    GI/Hepatic negative GI ROS,   Endo/Other  diabetes, Type 2Hypothyroidism Diet controlled  Renal/GU Dialysis and ESRFRenal diseaseLast HD Thurs 9/21 K 4.1     Musculoskeletal   Abdominal   Peds  Hematology   Anesthesia Other Findings   Reproductive/Obstetrics                           Anesthesia Physical Anesthesia Plan  ASA: III  Anesthesia Plan: MAC   Post-op Pain Management:    Induction: Intravenous  Airway Management Planned: Nasal Cannula, Natural Airway and Simple Face Mask  Additional Equipment: None  Intra-op Plan:   Post-operative Plan:   Informed Consent: I have reviewed the patients History and Physical, chart, labs and discussed the procedure including the risks, benefits and alternatives for the proposed anesthesia with the patient or authorized representative who has indicated his/her understanding and acceptance.   Dental advisory given  Plan Discussed with: CRNA and Surgeon  Anesthesia Plan Comments:         Anesthesia Quick Evaluation

## 2016-06-02 DIAGNOSIS — Z23 Encounter for immunization: Secondary | ICD-10-CM | POA: Diagnosis not present

## 2016-06-02 DIAGNOSIS — N186 End stage renal disease: Secondary | ICD-10-CM | POA: Diagnosis not present

## 2016-06-02 DIAGNOSIS — E8779 Other fluid overload: Secondary | ICD-10-CM | POA: Diagnosis not present

## 2016-06-02 DIAGNOSIS — D631 Anemia in chronic kidney disease: Secondary | ICD-10-CM | POA: Diagnosis not present

## 2016-06-02 DIAGNOSIS — E1129 Type 2 diabetes mellitus with other diabetic kidney complication: Secondary | ICD-10-CM | POA: Diagnosis not present

## 2016-06-02 DIAGNOSIS — D689 Coagulation defect, unspecified: Secondary | ICD-10-CM | POA: Diagnosis not present

## 2016-06-02 DIAGNOSIS — N2581 Secondary hyperparathyroidism of renal origin: Secondary | ICD-10-CM | POA: Diagnosis not present

## 2016-06-04 ENCOUNTER — Encounter (HOSPITAL_COMMUNITY): Payer: Self-pay | Admitting: Vascular Surgery

## 2016-06-04 NOTE — Anesthesia Postprocedure Evaluation (Signed)
Anesthesia Post Note  Patient: [name]  Procedure(s) Performed: Procedure(s) (LRB): LIGATION OF COMPETING BRANCHES OF BRACHIOCEPHALIC ARTERIOVENOUS FISTULA LEFT ARM (Left)  Patient location during evaluation: PACU Anesthesia Type: MAC Level of consciousness: awake Pain management: pain level controlled Vital Signs Assessment: post-procedure vital signs reviewed and stable Respiratory status: spontaneous breathing Cardiovascular status: stable Postop Assessment: no signs of nausea or vomiting Anesthetic complications: no    Last Vitals:  Vitals:   06/01/16 0930 06/01/16 0940  BP: (!) 91/43 (!) 110/40  Pulse: 66 100  Resp: 17 17  Temp: 36.7 C     Last Pain:  Vitals:   06/01/16 0703  TempSrc: Oral                 Andersen Mckiver

## 2016-06-05 DIAGNOSIS — E1129 Type 2 diabetes mellitus with other diabetic kidney complication: Secondary | ICD-10-CM | POA: Diagnosis not present

## 2016-06-05 DIAGNOSIS — D631 Anemia in chronic kidney disease: Secondary | ICD-10-CM | POA: Diagnosis not present

## 2016-06-05 DIAGNOSIS — N186 End stage renal disease: Secondary | ICD-10-CM | POA: Diagnosis not present

## 2016-06-05 DIAGNOSIS — N2581 Secondary hyperparathyroidism of renal origin: Secondary | ICD-10-CM | POA: Diagnosis not present

## 2016-06-05 DIAGNOSIS — E8779 Other fluid overload: Secondary | ICD-10-CM | POA: Diagnosis not present

## 2016-06-05 DIAGNOSIS — Z23 Encounter for immunization: Secondary | ICD-10-CM | POA: Diagnosis not present

## 2016-06-05 DIAGNOSIS — D689 Coagulation defect, unspecified: Secondary | ICD-10-CM | POA: Diagnosis not present

## 2016-06-06 DIAGNOSIS — E8779 Other fluid overload: Secondary | ICD-10-CM | POA: Diagnosis not present

## 2016-06-06 DIAGNOSIS — E1129 Type 2 diabetes mellitus with other diabetic kidney complication: Secondary | ICD-10-CM | POA: Diagnosis not present

## 2016-06-06 DIAGNOSIS — N2581 Secondary hyperparathyroidism of renal origin: Secondary | ICD-10-CM | POA: Diagnosis not present

## 2016-06-06 DIAGNOSIS — Z23 Encounter for immunization: Secondary | ICD-10-CM | POA: Diagnosis not present

## 2016-06-06 DIAGNOSIS — D689 Coagulation defect, unspecified: Secondary | ICD-10-CM | POA: Diagnosis not present

## 2016-06-06 DIAGNOSIS — D631 Anemia in chronic kidney disease: Secondary | ICD-10-CM | POA: Diagnosis not present

## 2016-06-06 DIAGNOSIS — N186 End stage renal disease: Secondary | ICD-10-CM | POA: Diagnosis not present

## 2016-06-07 DIAGNOSIS — D689 Coagulation defect, unspecified: Secondary | ICD-10-CM | POA: Diagnosis not present

## 2016-06-07 DIAGNOSIS — N186 End stage renal disease: Secondary | ICD-10-CM | POA: Diagnosis not present

## 2016-06-07 DIAGNOSIS — E1129 Type 2 diabetes mellitus with other diabetic kidney complication: Secondary | ICD-10-CM | POA: Diagnosis not present

## 2016-06-07 DIAGNOSIS — D631 Anemia in chronic kidney disease: Secondary | ICD-10-CM | POA: Diagnosis not present

## 2016-06-07 DIAGNOSIS — E8779 Other fluid overload: Secondary | ICD-10-CM | POA: Diagnosis not present

## 2016-06-07 DIAGNOSIS — N2581 Secondary hyperparathyroidism of renal origin: Secondary | ICD-10-CM | POA: Diagnosis not present

## 2016-06-07 DIAGNOSIS — Z23 Encounter for immunization: Secondary | ICD-10-CM | POA: Diagnosis not present

## 2016-06-09 DIAGNOSIS — Z23 Encounter for immunization: Secondary | ICD-10-CM | POA: Diagnosis not present

## 2016-06-09 DIAGNOSIS — E1129 Type 2 diabetes mellitus with other diabetic kidney complication: Secondary | ICD-10-CM | POA: Diagnosis not present

## 2016-06-09 DIAGNOSIS — N2581 Secondary hyperparathyroidism of renal origin: Secondary | ICD-10-CM | POA: Diagnosis not present

## 2016-06-09 DIAGNOSIS — N186 End stage renal disease: Secondary | ICD-10-CM | POA: Diagnosis not present

## 2016-06-09 DIAGNOSIS — D631 Anemia in chronic kidney disease: Secondary | ICD-10-CM | POA: Diagnosis not present

## 2016-06-09 DIAGNOSIS — D689 Coagulation defect, unspecified: Secondary | ICD-10-CM | POA: Diagnosis not present

## 2016-06-09 DIAGNOSIS — E1122 Type 2 diabetes mellitus with diabetic chronic kidney disease: Secondary | ICD-10-CM | POA: Diagnosis not present

## 2016-06-09 DIAGNOSIS — E8779 Other fluid overload: Secondary | ICD-10-CM | POA: Diagnosis not present

## 2016-06-09 DIAGNOSIS — Z992 Dependence on renal dialysis: Secondary | ICD-10-CM | POA: Diagnosis not present

## 2016-06-12 DIAGNOSIS — D689 Coagulation defect, unspecified: Secondary | ICD-10-CM | POA: Diagnosis not present

## 2016-06-12 DIAGNOSIS — N186 End stage renal disease: Secondary | ICD-10-CM | POA: Diagnosis not present

## 2016-06-12 DIAGNOSIS — E1129 Type 2 diabetes mellitus with other diabetic kidney complication: Secondary | ICD-10-CM | POA: Diagnosis not present

## 2016-06-12 DIAGNOSIS — E8779 Other fluid overload: Secondary | ICD-10-CM | POA: Diagnosis not present

## 2016-06-12 DIAGNOSIS — D631 Anemia in chronic kidney disease: Secondary | ICD-10-CM | POA: Diagnosis not present

## 2016-06-14 DIAGNOSIS — D631 Anemia in chronic kidney disease: Secondary | ICD-10-CM | POA: Diagnosis not present

## 2016-06-14 DIAGNOSIS — E8779 Other fluid overload: Secondary | ICD-10-CM | POA: Diagnosis not present

## 2016-06-14 DIAGNOSIS — E1129 Type 2 diabetes mellitus with other diabetic kidney complication: Secondary | ICD-10-CM | POA: Diagnosis not present

## 2016-06-14 DIAGNOSIS — D689 Coagulation defect, unspecified: Secondary | ICD-10-CM | POA: Diagnosis not present

## 2016-06-14 DIAGNOSIS — N186 End stage renal disease: Secondary | ICD-10-CM | POA: Diagnosis not present

## 2016-06-16 DIAGNOSIS — N186 End stage renal disease: Secondary | ICD-10-CM | POA: Diagnosis not present

## 2016-06-16 DIAGNOSIS — D689 Coagulation defect, unspecified: Secondary | ICD-10-CM | POA: Diagnosis not present

## 2016-06-16 DIAGNOSIS — D631 Anemia in chronic kidney disease: Secondary | ICD-10-CM | POA: Diagnosis not present

## 2016-06-16 DIAGNOSIS — E1129 Type 2 diabetes mellitus with other diabetic kidney complication: Secondary | ICD-10-CM | POA: Diagnosis not present

## 2016-06-16 DIAGNOSIS — E8779 Other fluid overload: Secondary | ICD-10-CM | POA: Diagnosis not present

## 2016-06-19 DIAGNOSIS — E8779 Other fluid overload: Secondary | ICD-10-CM | POA: Diagnosis not present

## 2016-06-19 DIAGNOSIS — D689 Coagulation defect, unspecified: Secondary | ICD-10-CM | POA: Diagnosis not present

## 2016-06-19 DIAGNOSIS — D631 Anemia in chronic kidney disease: Secondary | ICD-10-CM | POA: Diagnosis not present

## 2016-06-19 DIAGNOSIS — E1129 Type 2 diabetes mellitus with other diabetic kidney complication: Secondary | ICD-10-CM | POA: Diagnosis not present

## 2016-06-19 DIAGNOSIS — N186 End stage renal disease: Secondary | ICD-10-CM | POA: Diagnosis not present

## 2016-06-20 DIAGNOSIS — D631 Anemia in chronic kidney disease: Secondary | ICD-10-CM | POA: Diagnosis not present

## 2016-06-20 DIAGNOSIS — E8779 Other fluid overload: Secondary | ICD-10-CM | POA: Diagnosis not present

## 2016-06-20 DIAGNOSIS — E1129 Type 2 diabetes mellitus with other diabetic kidney complication: Secondary | ICD-10-CM | POA: Diagnosis not present

## 2016-06-20 DIAGNOSIS — D689 Coagulation defect, unspecified: Secondary | ICD-10-CM | POA: Diagnosis not present

## 2016-06-20 DIAGNOSIS — N186 End stage renal disease: Secondary | ICD-10-CM | POA: Diagnosis not present

## 2016-06-21 DIAGNOSIS — D631 Anemia in chronic kidney disease: Secondary | ICD-10-CM | POA: Diagnosis not present

## 2016-06-21 DIAGNOSIS — E1129 Type 2 diabetes mellitus with other diabetic kidney complication: Secondary | ICD-10-CM | POA: Diagnosis not present

## 2016-06-21 DIAGNOSIS — E8779 Other fluid overload: Secondary | ICD-10-CM | POA: Diagnosis not present

## 2016-06-21 DIAGNOSIS — N186 End stage renal disease: Secondary | ICD-10-CM | POA: Diagnosis not present

## 2016-06-21 DIAGNOSIS — D689 Coagulation defect, unspecified: Secondary | ICD-10-CM | POA: Diagnosis not present

## 2016-06-23 DIAGNOSIS — D689 Coagulation defect, unspecified: Secondary | ICD-10-CM | POA: Diagnosis not present

## 2016-06-23 DIAGNOSIS — E8779 Other fluid overload: Secondary | ICD-10-CM | POA: Diagnosis not present

## 2016-06-23 DIAGNOSIS — D631 Anemia in chronic kidney disease: Secondary | ICD-10-CM | POA: Diagnosis not present

## 2016-06-23 DIAGNOSIS — N186 End stage renal disease: Secondary | ICD-10-CM | POA: Diagnosis not present

## 2016-06-23 DIAGNOSIS — E1129 Type 2 diabetes mellitus with other diabetic kidney complication: Secondary | ICD-10-CM | POA: Diagnosis not present

## 2016-06-26 DIAGNOSIS — N186 End stage renal disease: Secondary | ICD-10-CM | POA: Diagnosis not present

## 2016-06-26 DIAGNOSIS — E8779 Other fluid overload: Secondary | ICD-10-CM | POA: Diagnosis not present

## 2016-06-26 DIAGNOSIS — E1129 Type 2 diabetes mellitus with other diabetic kidney complication: Secondary | ICD-10-CM | POA: Diagnosis not present

## 2016-06-26 DIAGNOSIS — D689 Coagulation defect, unspecified: Secondary | ICD-10-CM | POA: Diagnosis not present

## 2016-06-26 DIAGNOSIS — D631 Anemia in chronic kidney disease: Secondary | ICD-10-CM | POA: Diagnosis not present

## 2016-06-28 DIAGNOSIS — E8779 Other fluid overload: Secondary | ICD-10-CM | POA: Diagnosis not present

## 2016-06-28 DIAGNOSIS — N186 End stage renal disease: Secondary | ICD-10-CM | POA: Diagnosis not present

## 2016-06-28 DIAGNOSIS — E1129 Type 2 diabetes mellitus with other diabetic kidney complication: Secondary | ICD-10-CM | POA: Diagnosis not present

## 2016-06-28 DIAGNOSIS — D689 Coagulation defect, unspecified: Secondary | ICD-10-CM | POA: Diagnosis not present

## 2016-06-28 DIAGNOSIS — D631 Anemia in chronic kidney disease: Secondary | ICD-10-CM | POA: Diagnosis not present

## 2016-06-30 DIAGNOSIS — D631 Anemia in chronic kidney disease: Secondary | ICD-10-CM | POA: Diagnosis not present

## 2016-06-30 DIAGNOSIS — N186 End stage renal disease: Secondary | ICD-10-CM | POA: Diagnosis not present

## 2016-06-30 DIAGNOSIS — E1129 Type 2 diabetes mellitus with other diabetic kidney complication: Secondary | ICD-10-CM | POA: Diagnosis not present

## 2016-06-30 DIAGNOSIS — E8779 Other fluid overload: Secondary | ICD-10-CM | POA: Diagnosis not present

## 2016-06-30 DIAGNOSIS — D689 Coagulation defect, unspecified: Secondary | ICD-10-CM | POA: Diagnosis not present

## 2016-07-03 DIAGNOSIS — E8779 Other fluid overload: Secondary | ICD-10-CM | POA: Diagnosis not present

## 2016-07-03 DIAGNOSIS — E1129 Type 2 diabetes mellitus with other diabetic kidney complication: Secondary | ICD-10-CM | POA: Diagnosis not present

## 2016-07-03 DIAGNOSIS — D689 Coagulation defect, unspecified: Secondary | ICD-10-CM | POA: Diagnosis not present

## 2016-07-03 DIAGNOSIS — D631 Anemia in chronic kidney disease: Secondary | ICD-10-CM | POA: Diagnosis not present

## 2016-07-03 DIAGNOSIS — N186 End stage renal disease: Secondary | ICD-10-CM | POA: Diagnosis not present

## 2016-07-05 ENCOUNTER — Encounter: Payer: Self-pay | Admitting: Vascular Surgery

## 2016-07-05 DIAGNOSIS — E8779 Other fluid overload: Secondary | ICD-10-CM | POA: Diagnosis not present

## 2016-07-05 DIAGNOSIS — E1129 Type 2 diabetes mellitus with other diabetic kidney complication: Secondary | ICD-10-CM | POA: Diagnosis not present

## 2016-07-05 DIAGNOSIS — D689 Coagulation defect, unspecified: Secondary | ICD-10-CM | POA: Diagnosis not present

## 2016-07-05 DIAGNOSIS — N186 End stage renal disease: Secondary | ICD-10-CM | POA: Diagnosis not present

## 2016-07-05 DIAGNOSIS — D631 Anemia in chronic kidney disease: Secondary | ICD-10-CM | POA: Diagnosis not present

## 2016-07-07 DIAGNOSIS — D689 Coagulation defect, unspecified: Secondary | ICD-10-CM | POA: Diagnosis not present

## 2016-07-07 DIAGNOSIS — E1129 Type 2 diabetes mellitus with other diabetic kidney complication: Secondary | ICD-10-CM | POA: Diagnosis not present

## 2016-07-07 DIAGNOSIS — N186 End stage renal disease: Secondary | ICD-10-CM | POA: Diagnosis not present

## 2016-07-07 DIAGNOSIS — D631 Anemia in chronic kidney disease: Secondary | ICD-10-CM | POA: Diagnosis not present

## 2016-07-07 DIAGNOSIS — E8779 Other fluid overload: Secondary | ICD-10-CM | POA: Diagnosis not present

## 2016-07-10 DIAGNOSIS — Z992 Dependence on renal dialysis: Secondary | ICD-10-CM | POA: Diagnosis not present

## 2016-07-10 DIAGNOSIS — E1122 Type 2 diabetes mellitus with diabetic chronic kidney disease: Secondary | ICD-10-CM | POA: Diagnosis not present

## 2016-07-10 DIAGNOSIS — N186 End stage renal disease: Secondary | ICD-10-CM | POA: Diagnosis not present

## 2016-07-10 DIAGNOSIS — E8779 Other fluid overload: Secondary | ICD-10-CM | POA: Diagnosis not present

## 2016-07-10 DIAGNOSIS — D689 Coagulation defect, unspecified: Secondary | ICD-10-CM | POA: Diagnosis not present

## 2016-07-10 DIAGNOSIS — D631 Anemia in chronic kidney disease: Secondary | ICD-10-CM | POA: Diagnosis not present

## 2016-07-10 DIAGNOSIS — E1129 Type 2 diabetes mellitus with other diabetic kidney complication: Secondary | ICD-10-CM | POA: Diagnosis not present

## 2016-07-11 ENCOUNTER — Encounter: Payer: Self-pay | Admitting: *Deleted

## 2016-07-11 ENCOUNTER — Encounter: Payer: Self-pay | Admitting: Vascular Surgery

## 2016-07-11 ENCOUNTER — Ambulatory Visit (INDEPENDENT_AMBULATORY_CARE_PROVIDER_SITE_OTHER): Payer: Medicare Other | Admitting: Vascular Surgery

## 2016-07-11 VITALS — BP 103/50 | HR 89 | Temp 97.2°F | Ht 66.0 in | Wt 199.0 lb

## 2016-07-11 DIAGNOSIS — N186 End stage renal disease: Secondary | ICD-10-CM

## 2016-07-11 DIAGNOSIS — Z48812 Encounter for surgical aftercare following surgery on the circulatory system: Secondary | ICD-10-CM

## 2016-07-11 DIAGNOSIS — E8779 Other fluid overload: Secondary | ICD-10-CM | POA: Diagnosis not present

## 2016-07-11 DIAGNOSIS — E1129 Type 2 diabetes mellitus with other diabetic kidney complication: Secondary | ICD-10-CM | POA: Diagnosis not present

## 2016-07-11 DIAGNOSIS — D631 Anemia in chronic kidney disease: Secondary | ICD-10-CM | POA: Diagnosis not present

## 2016-07-11 DIAGNOSIS — D689 Coagulation defect, unspecified: Secondary | ICD-10-CM | POA: Diagnosis not present

## 2016-07-11 NOTE — Progress Notes (Signed)
   Patient name: [name] MRN: [id number] DOB: May 07, 1932 Sex: female  REASON FOR VISIT: Follow up of left brachiocephalic AV fistula.  HPI: [name] is a 82 y.o. female who has a left brachiocephalic AV fistula which has been difficult to access. A fistulogram showed a large competing branch. The only other issue was that the fistula was deep. On 06/01/2016, the patient underwent ligation of competing branches of left brachiocephalic AV fistula and superficialization. She comes in for a follow up visit.  She has no specific complaints. She denies pain or paresthesias in her left arm.  Current Outpatient Prescriptions  Medication Sig Dispense Refill  . ALPRAZolam (XANAX) 0.5 MG tablet Take 0.5 mg by mouth 3 (three) times daily as needed for anxiety.     . Cholecalciferol (VITAMIN D3) 2000 units TABS Take 2,000 Units by mouth daily.    . fish oil-omega-3 fatty acids 1000 MG capsule Take 2 g by mouth daily.     Marland Kitchen levothyroxine (SYNTHROID, LEVOTHROID) 125 MCG tablet Take 125 mcg by mouth daily before breakfast.     . sevelamer carbonate (RENVELA) 800 MG tablet Take 800 mg by mouth 2 (two) times daily.      No current facility-administered medications for this visit.     REVIEW OF SYSTEMS:  [X]  denotes positive finding, [ ]  denotes negative finding Cardiac  Comments:  Chest pain or chest pressure:    Shortness of breath upon exertion:    Short of breath when lying flat:    Irregular heart rhythm:    Constitutional    Fever or chills:      PHYSICAL EXAM: Vitals:   07/11/16 1419  BP: (!) 103/50  Pulse: 89  Temp: 97.2 F (36.2 C)  TempSrc: Oral  SpO2: 98%  Weight: 199 lb (90.3 kg)  Height: 5\' 6"  (1.676 m)    GENERAL: The patient is a well-nourished female, in no acute distress. The vital signs are documented above. CARDIOVASCULAR: There is a regular rate and rhythm. PULMONARY: There is good air exchange bilaterally without wheezing or rales. Her fistula has an  excellent thrill although the vein rolls some.  MEDICAL ISSUES:  STATUS POST REVISION OF LEFT BRACHIOCEPHALIC AV FISTULA: The fistula has a good thrill although the vein rolls some and therefore may be difficult to cannulate. I recommended that we bring her back in 4 weeks for a duplex before we allow them to begin cannulating her fistula again. In the meantime he'll continue to use her catheter.  [facility] [place] [phone]

## 2016-07-12 DIAGNOSIS — E8779 Other fluid overload: Secondary | ICD-10-CM | POA: Diagnosis not present

## 2016-07-12 DIAGNOSIS — D631 Anemia in chronic kidney disease: Secondary | ICD-10-CM | POA: Diagnosis not present

## 2016-07-12 DIAGNOSIS — Z7689 Persons encountering health services in other specified circumstances: Secondary | ICD-10-CM | POA: Diagnosis not present

## 2016-07-12 DIAGNOSIS — E1129 Type 2 diabetes mellitus with other diabetic kidney complication: Secondary | ICD-10-CM | POA: Diagnosis not present

## 2016-07-12 DIAGNOSIS — N186 End stage renal disease: Secondary | ICD-10-CM | POA: Diagnosis not present

## 2016-07-12 DIAGNOSIS — D689 Coagulation defect, unspecified: Secondary | ICD-10-CM | POA: Diagnosis not present

## 2016-07-14 DIAGNOSIS — E8779 Other fluid overload: Secondary | ICD-10-CM | POA: Diagnosis not present

## 2016-07-14 DIAGNOSIS — D689 Coagulation defect, unspecified: Secondary | ICD-10-CM | POA: Diagnosis not present

## 2016-07-14 DIAGNOSIS — N186 End stage renal disease: Secondary | ICD-10-CM | POA: Diagnosis not present

## 2016-07-14 DIAGNOSIS — D631 Anemia in chronic kidney disease: Secondary | ICD-10-CM | POA: Diagnosis not present

## 2016-07-14 DIAGNOSIS — E1129 Type 2 diabetes mellitus with other diabetic kidney complication: Secondary | ICD-10-CM | POA: Diagnosis not present

## 2016-07-14 DIAGNOSIS — Z7689 Persons encountering health services in other specified circumstances: Secondary | ICD-10-CM | POA: Diagnosis not present

## 2016-07-16 DIAGNOSIS — Z7689 Persons encountering health services in other specified circumstances: Secondary | ICD-10-CM | POA: Diagnosis not present

## 2016-07-17 DIAGNOSIS — Z7689 Persons encountering health services in other specified circumstances: Secondary | ICD-10-CM | POA: Diagnosis not present

## 2016-07-17 DIAGNOSIS — N186 End stage renal disease: Secondary | ICD-10-CM | POA: Diagnosis not present

## 2016-07-17 DIAGNOSIS — E8779 Other fluid overload: Secondary | ICD-10-CM | POA: Diagnosis not present

## 2016-07-17 DIAGNOSIS — D689 Coagulation defect, unspecified: Secondary | ICD-10-CM | POA: Diagnosis not present

## 2016-07-17 DIAGNOSIS — D631 Anemia in chronic kidney disease: Secondary | ICD-10-CM | POA: Diagnosis not present

## 2016-07-17 DIAGNOSIS — E1129 Type 2 diabetes mellitus with other diabetic kidney complication: Secondary | ICD-10-CM | POA: Diagnosis not present

## 2016-07-19 DIAGNOSIS — D689 Coagulation defect, unspecified: Secondary | ICD-10-CM | POA: Diagnosis not present

## 2016-07-19 DIAGNOSIS — E1129 Type 2 diabetes mellitus with other diabetic kidney complication: Secondary | ICD-10-CM | POA: Diagnosis not present

## 2016-07-19 DIAGNOSIS — Z7689 Persons encountering health services in other specified circumstances: Secondary | ICD-10-CM | POA: Diagnosis not present

## 2016-07-19 DIAGNOSIS — E8779 Other fluid overload: Secondary | ICD-10-CM | POA: Diagnosis not present

## 2016-07-19 DIAGNOSIS — D631 Anemia in chronic kidney disease: Secondary | ICD-10-CM | POA: Diagnosis not present

## 2016-07-19 DIAGNOSIS — N186 End stage renal disease: Secondary | ICD-10-CM | POA: Diagnosis not present

## 2016-07-20 NOTE — Addendum Note (Signed)
Addended by: Sharee Pimple on: 07/20/2016 11:59 AM   Modules accepted: Orders

## 2016-07-21 DIAGNOSIS — D689 Coagulation defect, unspecified: Secondary | ICD-10-CM | POA: Diagnosis not present

## 2016-07-21 DIAGNOSIS — D631 Anemia in chronic kidney disease: Secondary | ICD-10-CM | POA: Diagnosis not present

## 2016-07-21 DIAGNOSIS — E8779 Other fluid overload: Secondary | ICD-10-CM | POA: Diagnosis not present

## 2016-07-21 DIAGNOSIS — N186 End stage renal disease: Secondary | ICD-10-CM | POA: Diagnosis not present

## 2016-07-21 DIAGNOSIS — E1129 Type 2 diabetes mellitus with other diabetic kidney complication: Secondary | ICD-10-CM | POA: Diagnosis not present

## 2016-07-21 DIAGNOSIS — Z7689 Persons encountering health services in other specified circumstances: Secondary | ICD-10-CM | POA: Diagnosis not present

## 2016-07-24 DIAGNOSIS — N186 End stage renal disease: Secondary | ICD-10-CM | POA: Diagnosis not present

## 2016-07-24 DIAGNOSIS — Z7689 Persons encountering health services in other specified circumstances: Secondary | ICD-10-CM | POA: Diagnosis not present

## 2016-07-24 DIAGNOSIS — D689 Coagulation defect, unspecified: Secondary | ICD-10-CM | POA: Diagnosis not present

## 2016-07-24 DIAGNOSIS — E1129 Type 2 diabetes mellitus with other diabetic kidney complication: Secondary | ICD-10-CM | POA: Diagnosis not present

## 2016-07-24 DIAGNOSIS — E8779 Other fluid overload: Secondary | ICD-10-CM | POA: Diagnosis not present

## 2016-07-24 DIAGNOSIS — D631 Anemia in chronic kidney disease: Secondary | ICD-10-CM | POA: Diagnosis not present

## 2016-07-26 DIAGNOSIS — Z7689 Persons encountering health services in other specified circumstances: Secondary | ICD-10-CM | POA: Diagnosis not present

## 2016-07-26 DIAGNOSIS — D631 Anemia in chronic kidney disease: Secondary | ICD-10-CM | POA: Diagnosis not present

## 2016-07-26 DIAGNOSIS — D689 Coagulation defect, unspecified: Secondary | ICD-10-CM | POA: Diagnosis not present

## 2016-07-26 DIAGNOSIS — E8779 Other fluid overload: Secondary | ICD-10-CM | POA: Diagnosis not present

## 2016-07-26 DIAGNOSIS — N186 End stage renal disease: Secondary | ICD-10-CM | POA: Diagnosis not present

## 2016-07-26 DIAGNOSIS — E1129 Type 2 diabetes mellitus with other diabetic kidney complication: Secondary | ICD-10-CM | POA: Diagnosis not present

## 2016-07-28 DIAGNOSIS — N186 End stage renal disease: Secondary | ICD-10-CM | POA: Diagnosis not present

## 2016-07-28 DIAGNOSIS — E8779 Other fluid overload: Secondary | ICD-10-CM | POA: Diagnosis not present

## 2016-07-28 DIAGNOSIS — D689 Coagulation defect, unspecified: Secondary | ICD-10-CM | POA: Diagnosis not present

## 2016-07-28 DIAGNOSIS — Z7689 Persons encountering health services in other specified circumstances: Secondary | ICD-10-CM | POA: Diagnosis not present

## 2016-07-28 DIAGNOSIS — D631 Anemia in chronic kidney disease: Secondary | ICD-10-CM | POA: Diagnosis not present

## 2016-07-28 DIAGNOSIS — E1129 Type 2 diabetes mellitus with other diabetic kidney complication: Secondary | ICD-10-CM | POA: Diagnosis not present

## 2016-07-31 DIAGNOSIS — N186 End stage renal disease: Secondary | ICD-10-CM | POA: Diagnosis not present

## 2016-07-31 DIAGNOSIS — Z7689 Persons encountering health services in other specified circumstances: Secondary | ICD-10-CM | POA: Diagnosis not present

## 2016-07-31 DIAGNOSIS — D689 Coagulation defect, unspecified: Secondary | ICD-10-CM | POA: Diagnosis not present

## 2016-07-31 DIAGNOSIS — E1129 Type 2 diabetes mellitus with other diabetic kidney complication: Secondary | ICD-10-CM | POA: Diagnosis not present

## 2016-07-31 DIAGNOSIS — D631 Anemia in chronic kidney disease: Secondary | ICD-10-CM | POA: Diagnosis not present

## 2016-07-31 DIAGNOSIS — E8779 Other fluid overload: Secondary | ICD-10-CM | POA: Diagnosis not present

## 2016-08-03 DIAGNOSIS — E1129 Type 2 diabetes mellitus with other diabetic kidney complication: Secondary | ICD-10-CM | POA: Diagnosis not present

## 2016-08-03 DIAGNOSIS — N186 End stage renal disease: Secondary | ICD-10-CM | POA: Diagnosis not present

## 2016-08-03 DIAGNOSIS — Z7689 Persons encountering health services in other specified circumstances: Secondary | ICD-10-CM | POA: Diagnosis not present

## 2016-08-03 DIAGNOSIS — D689 Coagulation defect, unspecified: Secondary | ICD-10-CM | POA: Diagnosis not present

## 2016-08-03 DIAGNOSIS — D631 Anemia in chronic kidney disease: Secondary | ICD-10-CM | POA: Diagnosis not present

## 2016-08-03 DIAGNOSIS — E8779 Other fluid overload: Secondary | ICD-10-CM | POA: Diagnosis not present

## 2016-08-04 DIAGNOSIS — D689 Coagulation defect, unspecified: Secondary | ICD-10-CM | POA: Diagnosis not present

## 2016-08-04 DIAGNOSIS — Z7689 Persons encountering health services in other specified circumstances: Secondary | ICD-10-CM | POA: Diagnosis not present

## 2016-08-04 DIAGNOSIS — E1129 Type 2 diabetes mellitus with other diabetic kidney complication: Secondary | ICD-10-CM | POA: Diagnosis not present

## 2016-08-04 DIAGNOSIS — D631 Anemia in chronic kidney disease: Secondary | ICD-10-CM | POA: Diagnosis not present

## 2016-08-04 DIAGNOSIS — N186 End stage renal disease: Secondary | ICD-10-CM | POA: Diagnosis not present

## 2016-08-04 DIAGNOSIS — E8779 Other fluid overload: Secondary | ICD-10-CM | POA: Diagnosis not present

## 2016-08-07 DIAGNOSIS — D689 Coagulation defect, unspecified: Secondary | ICD-10-CM | POA: Diagnosis not present

## 2016-08-07 DIAGNOSIS — D631 Anemia in chronic kidney disease: Secondary | ICD-10-CM | POA: Diagnosis not present

## 2016-08-07 DIAGNOSIS — E8779 Other fluid overload: Secondary | ICD-10-CM | POA: Diagnosis not present

## 2016-08-07 DIAGNOSIS — N186 End stage renal disease: Secondary | ICD-10-CM | POA: Diagnosis not present

## 2016-08-07 DIAGNOSIS — E1129 Type 2 diabetes mellitus with other diabetic kidney complication: Secondary | ICD-10-CM | POA: Diagnosis not present

## 2016-08-07 DIAGNOSIS — Z7689 Persons encountering health services in other specified circumstances: Secondary | ICD-10-CM | POA: Diagnosis not present

## 2016-08-08 ENCOUNTER — Encounter: Payer: Self-pay | Admitting: Vascular Surgery

## 2016-08-08 ENCOUNTER — Ambulatory Visit (HOSPITAL_COMMUNITY)
Admission: RE | Admit: 2016-08-08 | Discharge: 2016-08-08 | Disposition: A | Discharge status: Home / Self Care | Payer: Medicare Other | Source: Ambulatory Visit | Attending: Vascular Surgery | Admitting: Vascular Surgery

## 2016-08-08 DIAGNOSIS — Z48812 Encounter for surgical aftercare following surgery on the circulatory system: Secondary | ICD-10-CM

## 2016-08-08 DIAGNOSIS — N186 End stage renal disease: Secondary | ICD-10-CM | POA: Diagnosis not present

## 2016-08-09 DIAGNOSIS — Z992 Dependence on renal dialysis: Secondary | ICD-10-CM | POA: Diagnosis not present

## 2016-08-09 DIAGNOSIS — N186 End stage renal disease: Secondary | ICD-10-CM | POA: Diagnosis not present

## 2016-08-09 DIAGNOSIS — Z7689 Persons encountering health services in other specified circumstances: Secondary | ICD-10-CM | POA: Diagnosis not present

## 2016-08-09 DIAGNOSIS — E8779 Other fluid overload: Secondary | ICD-10-CM | POA: Diagnosis not present

## 2016-08-09 DIAGNOSIS — D689 Coagulation defect, unspecified: Secondary | ICD-10-CM | POA: Diagnosis not present

## 2016-08-09 DIAGNOSIS — E1129 Type 2 diabetes mellitus with other diabetic kidney complication: Secondary | ICD-10-CM | POA: Diagnosis not present

## 2016-08-09 DIAGNOSIS — D631 Anemia in chronic kidney disease: Secondary | ICD-10-CM | POA: Diagnosis not present

## 2016-08-09 DIAGNOSIS — E1122 Type 2 diabetes mellitus with diabetic chronic kidney disease: Secondary | ICD-10-CM | POA: Diagnosis not present

## 2016-08-11 DIAGNOSIS — N2581 Secondary hyperparathyroidism of renal origin: Secondary | ICD-10-CM | POA: Diagnosis not present

## 2016-08-11 DIAGNOSIS — D689 Coagulation defect, unspecified: Secondary | ICD-10-CM | POA: Diagnosis not present

## 2016-08-11 DIAGNOSIS — E1129 Type 2 diabetes mellitus with other diabetic kidney complication: Secondary | ICD-10-CM | POA: Diagnosis not present

## 2016-08-11 DIAGNOSIS — D509 Iron deficiency anemia, unspecified: Secondary | ICD-10-CM | POA: Diagnosis not present

## 2016-08-11 DIAGNOSIS — N186 End stage renal disease: Secondary | ICD-10-CM | POA: Diagnosis not present

## 2016-08-11 DIAGNOSIS — D631 Anemia in chronic kidney disease: Secondary | ICD-10-CM | POA: Diagnosis not present

## 2016-08-14 DIAGNOSIS — E1129 Type 2 diabetes mellitus with other diabetic kidney complication: Secondary | ICD-10-CM | POA: Diagnosis not present

## 2016-08-14 DIAGNOSIS — D509 Iron deficiency anemia, unspecified: Secondary | ICD-10-CM | POA: Diagnosis not present

## 2016-08-14 DIAGNOSIS — N186 End stage renal disease: Secondary | ICD-10-CM | POA: Diagnosis not present

## 2016-08-14 DIAGNOSIS — D631 Anemia in chronic kidney disease: Secondary | ICD-10-CM | POA: Diagnosis not present

## 2016-08-14 DIAGNOSIS — N2581 Secondary hyperparathyroidism of renal origin: Secondary | ICD-10-CM | POA: Diagnosis not present

## 2016-08-14 DIAGNOSIS — D689 Coagulation defect, unspecified: Secondary | ICD-10-CM | POA: Diagnosis not present

## 2016-08-15 ENCOUNTER — Ambulatory Visit (INDEPENDENT_AMBULATORY_CARE_PROVIDER_SITE_OTHER): Payer: Medicare Other | Admitting: Vascular Surgery

## 2016-08-15 ENCOUNTER — Encounter: Payer: Self-pay | Admitting: Vascular Surgery

## 2016-08-15 VITALS — BP 100/59 | HR 73 | Temp 98.1°F | Resp 14 | Ht 66.0 in | Wt 198.0 lb

## 2016-08-15 DIAGNOSIS — N186 End stage renal disease: Secondary | ICD-10-CM

## 2016-08-15 NOTE — Progress Notes (Signed)
   Patient name: [name] MRN: [id number] DOB: 03-14-1932 Sex: female  REASON FOR VISIT: Follow up after revision of left brachiocephalic AV fistula.  HPI: [name] is a 82 y.o. female who has a left brachiocephalic fistula which has been difficult to access. A fistulogram showed a large competing branch. In addition the fistula was deep. This reason I recommended ligation of the competing branch and superficialization. This was performed on 06/01/2016. She comes in for a follow up visit.  Since her fistula was revised, they have attempted to access this with one needle which was successful. However, when they attempted to needles the second needle was not successful. She has no pain or paresthesias in the left arm.  Current Outpatient Prescriptions  Medication Sig Dispense Refill  . ALPRAZolam (XANAX) 0.5 MG tablet Take 0.5 mg by mouth 3 (three) times daily as needed for anxiety.     . Cholecalciferol (VITAMIN D3) 2000 units TABS Take 2,000 Units by mouth daily.    . fish oil-omega-3 fatty acids 1000 MG capsule Take 2 g by mouth daily.     Marland Kitchen levothyroxine (SYNTHROID, LEVOTHROID) 125 MCG tablet Take 125 mcg by mouth daily before breakfast.     . SENSIPAR 30 MG tablet     . sevelamer carbonate (RENVELA) 800 MG tablet Take 800 mg by mouth 2 (two) times daily.      No current facility-administered medications for this visit.     REVIEW OF SYSTEMS:  [X]  denotes positive finding, [ ]  denotes negative finding Cardiac  Comments:  Chest pain or chest pressure:    Shortness of breath upon exertion:    Short of breath when lying flat:    Irregular heart rhythm:    Constitutional    Fever or chills:      PHYSICAL EXAM: Vitals:   08/15/16 0907  BP: (!) 100/59  Pulse: 73  Resp: 14  Temp: 98.1 F (36.7 C)  SpO2: 100%  Weight: 198 lb (89.8 kg)  Height: 5\' 6"  (1.676 m)    GENERAL: The patient is a well-nourished female, in no acute distress. The vital signs are documented  above. CARDIOVASCULAR: There is a regular rate and rhythm. PULMONARY: There is good air exchange bilaterally without wheezing or rales. Her left upper arm fistula has a good thrill proximally and is not especially pulsatile. However did somewhat more difficult to follow distally and may be tortuous.  MEDICAL ISSUES:  SLOWLY MATURING LEFT BRACHIOCEPHALIC AV FISTULA: I think that they can continue to attempt to access the fistula with one needle for now and we will give the fistula more time to mature. I'll see her back in 6 weeks with a duplex. If they're still having problems she'll need a fistulogram although I am not sure they will be any further ways to revise this fistula. If this fistula cannot be salvaged and she would likely require either a basilic vein transposition on the left or an AV graft.  [facility] [place] [phone]

## 2016-08-16 ENCOUNTER — Telehealth: Payer: Self-pay | Admitting: *Deleted

## 2016-08-16 DIAGNOSIS — E1129 Type 2 diabetes mellitus with other diabetic kidney complication: Secondary | ICD-10-CM | POA: Diagnosis not present

## 2016-08-16 DIAGNOSIS — D509 Iron deficiency anemia, unspecified: Secondary | ICD-10-CM | POA: Diagnosis not present

## 2016-08-16 DIAGNOSIS — D631 Anemia in chronic kidney disease: Secondary | ICD-10-CM | POA: Diagnosis not present

## 2016-08-16 DIAGNOSIS — D689 Coagulation defect, unspecified: Secondary | ICD-10-CM | POA: Diagnosis not present

## 2016-08-16 DIAGNOSIS — N186 End stage renal disease: Secondary | ICD-10-CM | POA: Diagnosis not present

## 2016-08-16 DIAGNOSIS — N2581 Secondary hyperparathyroidism of renal origin: Secondary | ICD-10-CM | POA: Diagnosis not present

## 2016-08-16 NOTE — Addendum Note (Signed)
Addended by: Burton Apley A on: 08/16/2016 09:52 AM   Modules accepted: Orders

## 2016-08-16 NOTE — Telephone Encounter (Signed)
[name] at [facility] just wanted Dr. Edilia Bo to be aware that they are cannulating the access using 2 needles without any difficulty. Patient was a little upset with them because they have been using 2 instead of 1 needle but Dr. Lowell Guitar spoke to her today and she seems ok with it. They will contact us if she needs any other care.

## 2016-08-18 DIAGNOSIS — E1129 Type 2 diabetes mellitus with other diabetic kidney complication: Secondary | ICD-10-CM | POA: Diagnosis not present

## 2016-08-18 DIAGNOSIS — D509 Iron deficiency anemia, unspecified: Secondary | ICD-10-CM | POA: Diagnosis not present

## 2016-08-18 DIAGNOSIS — D631 Anemia in chronic kidney disease: Secondary | ICD-10-CM | POA: Diagnosis not present

## 2016-08-18 DIAGNOSIS — D689 Coagulation defect, unspecified: Secondary | ICD-10-CM | POA: Diagnosis not present

## 2016-08-18 DIAGNOSIS — N2581 Secondary hyperparathyroidism of renal origin: Secondary | ICD-10-CM | POA: Diagnosis not present

## 2016-08-18 DIAGNOSIS — N186 End stage renal disease: Secondary | ICD-10-CM | POA: Diagnosis not present

## 2016-08-21 DIAGNOSIS — N2581 Secondary hyperparathyroidism of renal origin: Secondary | ICD-10-CM | POA: Diagnosis not present

## 2016-08-21 DIAGNOSIS — D631 Anemia in chronic kidney disease: Secondary | ICD-10-CM | POA: Diagnosis not present

## 2016-08-21 DIAGNOSIS — E1129 Type 2 diabetes mellitus with other diabetic kidney complication: Secondary | ICD-10-CM | POA: Diagnosis not present

## 2016-08-21 DIAGNOSIS — D689 Coagulation defect, unspecified: Secondary | ICD-10-CM | POA: Diagnosis not present

## 2016-08-21 DIAGNOSIS — N186 End stage renal disease: Secondary | ICD-10-CM | POA: Diagnosis not present

## 2016-08-21 DIAGNOSIS — D509 Iron deficiency anemia, unspecified: Secondary | ICD-10-CM | POA: Diagnosis not present

## 2016-08-23 DIAGNOSIS — N2581 Secondary hyperparathyroidism of renal origin: Secondary | ICD-10-CM | POA: Diagnosis not present

## 2016-08-23 DIAGNOSIS — E1129 Type 2 diabetes mellitus with other diabetic kidney complication: Secondary | ICD-10-CM | POA: Diagnosis not present

## 2016-08-23 DIAGNOSIS — D631 Anemia in chronic kidney disease: Secondary | ICD-10-CM | POA: Diagnosis not present

## 2016-08-23 DIAGNOSIS — N186 End stage renal disease: Secondary | ICD-10-CM | POA: Diagnosis not present

## 2016-08-23 DIAGNOSIS — D689 Coagulation defect, unspecified: Secondary | ICD-10-CM | POA: Diagnosis not present

## 2016-08-23 DIAGNOSIS — D509 Iron deficiency anemia, unspecified: Secondary | ICD-10-CM | POA: Diagnosis not present

## 2016-08-25 DIAGNOSIS — D509 Iron deficiency anemia, unspecified: Secondary | ICD-10-CM | POA: Diagnosis not present

## 2016-08-25 DIAGNOSIS — D631 Anemia in chronic kidney disease: Secondary | ICD-10-CM | POA: Diagnosis not present

## 2016-08-25 DIAGNOSIS — E1129 Type 2 diabetes mellitus with other diabetic kidney complication: Secondary | ICD-10-CM | POA: Diagnosis not present

## 2016-08-25 DIAGNOSIS — N2581 Secondary hyperparathyroidism of renal origin: Secondary | ICD-10-CM | POA: Diagnosis not present

## 2016-08-25 DIAGNOSIS — D689 Coagulation defect, unspecified: Secondary | ICD-10-CM | POA: Diagnosis not present

## 2016-08-25 DIAGNOSIS — N186 End stage renal disease: Secondary | ICD-10-CM | POA: Diagnosis not present

## 2016-08-28 DIAGNOSIS — N2581 Secondary hyperparathyroidism of renal origin: Secondary | ICD-10-CM | POA: Diagnosis not present

## 2016-08-28 DIAGNOSIS — D689 Coagulation defect, unspecified: Secondary | ICD-10-CM | POA: Diagnosis not present

## 2016-08-28 DIAGNOSIS — N186 End stage renal disease: Secondary | ICD-10-CM | POA: Diagnosis not present

## 2016-08-28 DIAGNOSIS — E1129 Type 2 diabetes mellitus with other diabetic kidney complication: Secondary | ICD-10-CM | POA: Diagnosis not present

## 2016-08-28 DIAGNOSIS — D631 Anemia in chronic kidney disease: Secondary | ICD-10-CM | POA: Diagnosis not present

## 2016-08-28 DIAGNOSIS — D509 Iron deficiency anemia, unspecified: Secondary | ICD-10-CM | POA: Diagnosis not present

## 2016-08-30 DIAGNOSIS — D689 Coagulation defect, unspecified: Secondary | ICD-10-CM | POA: Diagnosis not present

## 2016-08-30 DIAGNOSIS — N186 End stage renal disease: Secondary | ICD-10-CM | POA: Diagnosis not present

## 2016-08-30 DIAGNOSIS — N2581 Secondary hyperparathyroidism of renal origin: Secondary | ICD-10-CM | POA: Diagnosis not present

## 2016-08-30 DIAGNOSIS — E1129 Type 2 diabetes mellitus with other diabetic kidney complication: Secondary | ICD-10-CM | POA: Diagnosis not present

## 2016-08-30 DIAGNOSIS — D509 Iron deficiency anemia, unspecified: Secondary | ICD-10-CM | POA: Diagnosis not present

## 2016-08-30 DIAGNOSIS — D631 Anemia in chronic kidney disease: Secondary | ICD-10-CM | POA: Diagnosis not present

## 2016-09-01 DIAGNOSIS — E1129 Type 2 diabetes mellitus with other diabetic kidney complication: Secondary | ICD-10-CM | POA: Diagnosis not present

## 2016-09-01 DIAGNOSIS — N2581 Secondary hyperparathyroidism of renal origin: Secondary | ICD-10-CM | POA: Diagnosis not present

## 2016-09-01 DIAGNOSIS — N186 End stage renal disease: Secondary | ICD-10-CM | POA: Diagnosis not present

## 2016-09-01 DIAGNOSIS — D509 Iron deficiency anemia, unspecified: Secondary | ICD-10-CM | POA: Diagnosis not present

## 2016-09-01 DIAGNOSIS — D689 Coagulation defect, unspecified: Secondary | ICD-10-CM | POA: Diagnosis not present

## 2016-09-01 DIAGNOSIS — D631 Anemia in chronic kidney disease: Secondary | ICD-10-CM | POA: Diagnosis not present

## 2016-09-04 DIAGNOSIS — E1129 Type 2 diabetes mellitus with other diabetic kidney complication: Secondary | ICD-10-CM | POA: Diagnosis not present

## 2016-09-04 DIAGNOSIS — N186 End stage renal disease: Secondary | ICD-10-CM | POA: Diagnosis not present

## 2016-09-04 DIAGNOSIS — N2581 Secondary hyperparathyroidism of renal origin: Secondary | ICD-10-CM | POA: Diagnosis not present

## 2016-09-04 DIAGNOSIS — D509 Iron deficiency anemia, unspecified: Secondary | ICD-10-CM | POA: Diagnosis not present

## 2016-09-04 DIAGNOSIS — D689 Coagulation defect, unspecified: Secondary | ICD-10-CM | POA: Diagnosis not present

## 2016-09-04 DIAGNOSIS — D631 Anemia in chronic kidney disease: Secondary | ICD-10-CM | POA: Diagnosis not present

## 2016-09-06 ENCOUNTER — Encounter: Payer: Self-pay | Admitting: Vascular Surgery

## 2016-09-06 DIAGNOSIS — N2581 Secondary hyperparathyroidism of renal origin: Secondary | ICD-10-CM | POA: Diagnosis not present

## 2016-09-06 DIAGNOSIS — D509 Iron deficiency anemia, unspecified: Secondary | ICD-10-CM | POA: Diagnosis not present

## 2016-09-06 DIAGNOSIS — D631 Anemia in chronic kidney disease: Secondary | ICD-10-CM | POA: Diagnosis not present

## 2016-09-06 DIAGNOSIS — E1129 Type 2 diabetes mellitus with other diabetic kidney complication: Secondary | ICD-10-CM | POA: Diagnosis not present

## 2016-09-06 DIAGNOSIS — N186 End stage renal disease: Secondary | ICD-10-CM | POA: Diagnosis not present

## 2016-09-06 DIAGNOSIS — D689 Coagulation defect, unspecified: Secondary | ICD-10-CM | POA: Diagnosis not present

## 2016-09-08 DIAGNOSIS — D631 Anemia in chronic kidney disease: Secondary | ICD-10-CM | POA: Diagnosis not present

## 2016-09-08 DIAGNOSIS — D509 Iron deficiency anemia, unspecified: Secondary | ICD-10-CM | POA: Diagnosis not present

## 2016-09-08 DIAGNOSIS — N186 End stage renal disease: Secondary | ICD-10-CM | POA: Diagnosis not present

## 2016-09-08 DIAGNOSIS — E1129 Type 2 diabetes mellitus with other diabetic kidney complication: Secondary | ICD-10-CM | POA: Diagnosis not present

## 2016-09-08 DIAGNOSIS — N2581 Secondary hyperparathyroidism of renal origin: Secondary | ICD-10-CM | POA: Diagnosis not present

## 2016-09-08 DIAGNOSIS — D689 Coagulation defect, unspecified: Secondary | ICD-10-CM | POA: Diagnosis not present

## 2016-09-09 DIAGNOSIS — N186 End stage renal disease: Secondary | ICD-10-CM | POA: Diagnosis not present

## 2016-09-09 DIAGNOSIS — Z992 Dependence on renal dialysis: Secondary | ICD-10-CM | POA: Diagnosis not present

## 2016-09-09 DIAGNOSIS — E1122 Type 2 diabetes mellitus with diabetic chronic kidney disease: Secondary | ICD-10-CM | POA: Diagnosis not present

## 2016-09-11 DIAGNOSIS — E1129 Type 2 diabetes mellitus with other diabetic kidney complication: Secondary | ICD-10-CM | POA: Diagnosis not present

## 2016-09-11 DIAGNOSIS — D689 Coagulation defect, unspecified: Secondary | ICD-10-CM | POA: Diagnosis not present

## 2016-09-11 DIAGNOSIS — D631 Anemia in chronic kidney disease: Secondary | ICD-10-CM | POA: Diagnosis not present

## 2016-09-11 DIAGNOSIS — N186 End stage renal disease: Secondary | ICD-10-CM | POA: Diagnosis not present

## 2016-09-11 DIAGNOSIS — Z7689 Persons encountering health services in other specified circumstances: Secondary | ICD-10-CM | POA: Diagnosis not present

## 2016-09-11 DIAGNOSIS — Z23 Encounter for immunization: Secondary | ICD-10-CM | POA: Diagnosis not present

## 2016-09-11 DIAGNOSIS — D509 Iron deficiency anemia, unspecified: Secondary | ICD-10-CM | POA: Diagnosis not present

## 2016-09-11 DIAGNOSIS — N2581 Secondary hyperparathyroidism of renal origin: Secondary | ICD-10-CM | POA: Diagnosis not present

## 2016-09-13 DIAGNOSIS — E1129 Type 2 diabetes mellitus with other diabetic kidney complication: Secondary | ICD-10-CM | POA: Diagnosis not present

## 2016-09-13 DIAGNOSIS — D689 Coagulation defect, unspecified: Secondary | ICD-10-CM | POA: Diagnosis not present

## 2016-09-13 DIAGNOSIS — D631 Anemia in chronic kidney disease: Secondary | ICD-10-CM | POA: Diagnosis not present

## 2016-09-13 DIAGNOSIS — N2581 Secondary hyperparathyroidism of renal origin: Secondary | ICD-10-CM | POA: Diagnosis not present

## 2016-09-13 DIAGNOSIS — D509 Iron deficiency anemia, unspecified: Secondary | ICD-10-CM | POA: Diagnosis not present

## 2016-09-13 DIAGNOSIS — Z23 Encounter for immunization: Secondary | ICD-10-CM | POA: Diagnosis not present

## 2016-09-13 DIAGNOSIS — N186 End stage renal disease: Secondary | ICD-10-CM | POA: Diagnosis not present

## 2016-09-13 DIAGNOSIS — Z7689 Persons encountering health services in other specified circumstances: Secondary | ICD-10-CM | POA: Diagnosis not present

## 2016-09-15 DIAGNOSIS — D689 Coagulation defect, unspecified: Secondary | ICD-10-CM | POA: Diagnosis not present

## 2016-09-15 DIAGNOSIS — E1129 Type 2 diabetes mellitus with other diabetic kidney complication: Secondary | ICD-10-CM | POA: Diagnosis not present

## 2016-09-15 DIAGNOSIS — D509 Iron deficiency anemia, unspecified: Secondary | ICD-10-CM | POA: Diagnosis not present

## 2016-09-15 DIAGNOSIS — Z7689 Persons encountering health services in other specified circumstances: Secondary | ICD-10-CM | POA: Diagnosis not present

## 2016-09-15 DIAGNOSIS — D631 Anemia in chronic kidney disease: Secondary | ICD-10-CM | POA: Diagnosis not present

## 2016-09-15 DIAGNOSIS — Z23 Encounter for immunization: Secondary | ICD-10-CM | POA: Diagnosis not present

## 2016-09-15 DIAGNOSIS — N2581 Secondary hyperparathyroidism of renal origin: Secondary | ICD-10-CM | POA: Diagnosis not present

## 2016-09-15 DIAGNOSIS — N186 End stage renal disease: Secondary | ICD-10-CM | POA: Diagnosis not present

## 2016-09-17 ENCOUNTER — Ambulatory Visit (INDEPENDENT_AMBULATORY_CARE_PROVIDER_SITE_OTHER): Payer: Medicare Other | Admitting: Obstetrics & Gynecology

## 2016-09-17 ENCOUNTER — Encounter: Payer: Self-pay | Admitting: Obstetrics & Gynecology

## 2016-09-17 VITALS — BP 136/61 | HR 73 | Wt 207.0 lb

## 2016-09-17 DIAGNOSIS — N813 Complete uterovaginal prolapse: Secondary | ICD-10-CM

## 2016-09-17 DIAGNOSIS — Z7689 Persons encountering health services in other specified circumstances: Secondary | ICD-10-CM | POA: Diagnosis not present

## 2016-09-17 NOTE — Progress Notes (Signed)
Patient ID: [name], female   DOB: 1931-10-12, 83 y.o.   MRN: [id number] Patient ID: [name], female   DOB: October 02, 1931, 83 y.o.   MRN: [id number] Chief Complaint  Patient presents with  . Pessary Check    clean    Blood pressure 136/61, pulse 73, weight 207 lb (93.9 kg).  [name] presents today for routine follow up related to her pessary.   She was fit last week for a Milex ring with support #5 after increased mucosal irritation from a [employer] She reports no vaginal discharge or vaginal bleeding.  Exam reveals no undue vaginal mucosal pressure of breakdown, no discharge and no vaginal bleeding.  The pessary is  replaced without difficulty.    [name] will be sen back in 4 months for continued follow up.  Lazaro Arms, MD   09/17/2016 2:41 PM

## 2016-09-18 DIAGNOSIS — Z23 Encounter for immunization: Secondary | ICD-10-CM | POA: Diagnosis not present

## 2016-09-18 DIAGNOSIS — D631 Anemia in chronic kidney disease: Secondary | ICD-10-CM | POA: Diagnosis not present

## 2016-09-18 DIAGNOSIS — D689 Coagulation defect, unspecified: Secondary | ICD-10-CM | POA: Diagnosis not present

## 2016-09-18 DIAGNOSIS — D509 Iron deficiency anemia, unspecified: Secondary | ICD-10-CM | POA: Diagnosis not present

## 2016-09-18 DIAGNOSIS — E1129 Type 2 diabetes mellitus with other diabetic kidney complication: Secondary | ICD-10-CM | POA: Diagnosis not present

## 2016-09-18 DIAGNOSIS — Z7689 Persons encountering health services in other specified circumstances: Secondary | ICD-10-CM | POA: Diagnosis not present

## 2016-09-18 DIAGNOSIS — N2581 Secondary hyperparathyroidism of renal origin: Secondary | ICD-10-CM | POA: Diagnosis not present

## 2016-09-18 DIAGNOSIS — N186 End stage renal disease: Secondary | ICD-10-CM | POA: Diagnosis not present

## 2016-09-19 ENCOUNTER — Encounter: Payer: Self-pay | Admitting: Vascular Surgery

## 2016-09-19 ENCOUNTER — Ambulatory Visit (INDEPENDENT_AMBULATORY_CARE_PROVIDER_SITE_OTHER): Payer: Medicare Other | Admitting: Vascular Surgery

## 2016-09-19 ENCOUNTER — Ambulatory Visit (HOSPITAL_COMMUNITY)
Admission: RE | Admit: 2016-09-19 | Discharge: 2016-09-19 | Disposition: A | Discharge status: Home / Self Care | Payer: Medicare Other | Source: Ambulatory Visit | Attending: Vascular Surgery | Admitting: Vascular Surgery

## 2016-09-19 VITALS — BP 127/58 | HR 77 | Temp 97.5°F | Resp 20 | Ht 66.0 in | Wt 211.0 lb

## 2016-09-19 DIAGNOSIS — N186 End stage renal disease: Secondary | ICD-10-CM | POA: Diagnosis not present

## 2016-09-19 DIAGNOSIS — Z992 Dependence on renal dialysis: Secondary | ICD-10-CM

## 2016-09-19 NOTE — Progress Notes (Signed)
Patient name: [name] MRN: [id number] DOB: 10-23-1931 Sex: female  REASON FOR VISIT: Hemodialysis access.  HPI: [name] is a 83 y.o. female who I last saw on 08/15/2016. This patient has a left brachiocephalic fistula which has been difficult to access. A fistulogram showed a large competing branch. In addition the fistula was deep. For this reason I recommended ligation of the competing branch and superficialization. This was done on 06/01/2016. When I saw her last the fistula had a good thrill proximally and was not pulsatile. However it was more difficult to follow distally and may be tortuous. I thought it would be safe to continue to use the fistula with one needle for now and give the fistula more time to mature. I set her up for a 6 week follow up visit. My feeling was that if the fistula could not be salvaged she would likely require either a basilic vein transposition on the left or an AV graft.  Since I saw her last, they have been using her upper arm fistula and it has been working reasonably well. She has no specific complaints. She does think that they're restricting her fluid to much. Her blood pressure does drop towards the end of dialysis.  Current Outpatient Prescriptions  Medication Sig Dispense Refill  . ALPRAZolam (XANAX) 0.5 MG tablet Take 0.5 mg by mouth 3 (three) times daily as needed for anxiety.     . Cholecalciferol (VITAMIN D3) 2000 units TABS Take 2,000 Units by mouth daily.    . fish oil-omega-3 fatty acids 1000 MG capsule Take 2 g by mouth daily.     Marland Kitchen levothyroxine (SYNTHROID, LEVOTHROID) 125 MCG tablet Take 125 mcg by mouth daily before breakfast.     . lidocaine-prilocaine (EMLA) cream Apply 1 application topically as needed.    . SENSIPAR 30 MG tablet     . sevelamer carbonate (RENVELA) 800 MG tablet Take 800 mg by mouth 2 (two) times daily.      No current facility-administered medications for this visit.     REVIEW OF SYSTEMS:  [X]  denotes  positive finding, [ ]  denotes negative finding Cardiac  Comments:  Chest pain or chest pressure:    Shortness of breath upon exertion:    Short of breath when lying flat:    Irregular heart rhythm:    Constitutional    Fever or chills:      PHYSICAL EXAM: Vitals:   09/19/16 1542  BP: (!) 127/58  Pulse: 77  Resp: 20  Temp: 97.5 F (36.4 C)  TempSrc: Oral  SpO2: 100%  Weight: 211 lb (95.7 kg)  Height: 5\' 6"  (1.676 m)    GENERAL: The patient is a well-nourished female, in no acute distress. The vital signs are documented above. CARDIOVASCULAR: There is a regular rate and rhythm. PULMONARY: There is good air exchange bilaterally without wheezing or rales. Her left upper arm fistula has a good thrill and bruit. It is not pulsatile. She does have some ecchymosis suggesting a possible recent infiltrate.  DUPLEX LEFT UPPER ARM: I have independently interpreted her duplex of the left upper extremity.  The diameters of the basilic vein range from 0.38-0.79 cm.  MEDICAL ISSUES:  END-STAGE RENAL DISEASE: Her left upper arm fistula appears to be working well. I would be reluctant to remove her catheter given that she does have some ecchymosis suggesting a possible infiltrate. I would continue to use her fistula and if it continues to work well and perhaps in  a few weeks for catheter could be removed. I will see her back as needed. Of note, based on her vein map today she might potentially be a candidate for basilic vein transposition if this fistula ultimately fails.  [name] [facility] [place] [phone]

## 2016-09-20 DIAGNOSIS — N186 End stage renal disease: Secondary | ICD-10-CM | POA: Diagnosis not present

## 2016-09-20 DIAGNOSIS — N2581 Secondary hyperparathyroidism of renal origin: Secondary | ICD-10-CM | POA: Diagnosis not present

## 2016-09-20 DIAGNOSIS — D509 Iron deficiency anemia, unspecified: Secondary | ICD-10-CM | POA: Diagnosis not present

## 2016-09-20 DIAGNOSIS — Z23 Encounter for immunization: Secondary | ICD-10-CM | POA: Diagnosis not present

## 2016-09-20 DIAGNOSIS — D631 Anemia in chronic kidney disease: Secondary | ICD-10-CM | POA: Diagnosis not present

## 2016-09-20 DIAGNOSIS — E1129 Type 2 diabetes mellitus with other diabetic kidney complication: Secondary | ICD-10-CM | POA: Diagnosis not present

## 2016-09-20 DIAGNOSIS — D689 Coagulation defect, unspecified: Secondary | ICD-10-CM | POA: Diagnosis not present

## 2016-09-20 DIAGNOSIS — Z7689 Persons encountering health services in other specified circumstances: Secondary | ICD-10-CM | POA: Diagnosis not present

## 2016-09-22 DIAGNOSIS — N2581 Secondary hyperparathyroidism of renal origin: Secondary | ICD-10-CM | POA: Diagnosis not present

## 2016-09-22 DIAGNOSIS — D689 Coagulation defect, unspecified: Secondary | ICD-10-CM | POA: Diagnosis not present

## 2016-09-22 DIAGNOSIS — N186 End stage renal disease: Secondary | ICD-10-CM | POA: Diagnosis not present

## 2016-09-22 DIAGNOSIS — D631 Anemia in chronic kidney disease: Secondary | ICD-10-CM | POA: Diagnosis not present

## 2016-09-22 DIAGNOSIS — Z7689 Persons encountering health services in other specified circumstances: Secondary | ICD-10-CM | POA: Diagnosis not present

## 2016-09-22 DIAGNOSIS — E1129 Type 2 diabetes mellitus with other diabetic kidney complication: Secondary | ICD-10-CM | POA: Diagnosis not present

## 2016-09-22 DIAGNOSIS — D509 Iron deficiency anemia, unspecified: Secondary | ICD-10-CM | POA: Diagnosis not present

## 2016-09-22 DIAGNOSIS — Z23 Encounter for immunization: Secondary | ICD-10-CM | POA: Diagnosis not present

## 2016-09-25 DIAGNOSIS — N2581 Secondary hyperparathyroidism of renal origin: Secondary | ICD-10-CM | POA: Diagnosis not present

## 2016-09-25 DIAGNOSIS — D631 Anemia in chronic kidney disease: Secondary | ICD-10-CM | POA: Diagnosis not present

## 2016-09-25 DIAGNOSIS — Z23 Encounter for immunization: Secondary | ICD-10-CM | POA: Diagnosis not present

## 2016-09-25 DIAGNOSIS — D689 Coagulation defect, unspecified: Secondary | ICD-10-CM | POA: Diagnosis not present

## 2016-09-25 DIAGNOSIS — Z7689 Persons encountering health services in other specified circumstances: Secondary | ICD-10-CM | POA: Diagnosis not present

## 2016-09-25 DIAGNOSIS — N186 End stage renal disease: Secondary | ICD-10-CM | POA: Diagnosis not present

## 2016-09-25 DIAGNOSIS — D509 Iron deficiency anemia, unspecified: Secondary | ICD-10-CM | POA: Diagnosis not present

## 2016-09-25 DIAGNOSIS — E1129 Type 2 diabetes mellitus with other diabetic kidney complication: Secondary | ICD-10-CM | POA: Diagnosis not present

## 2016-09-27 DIAGNOSIS — D689 Coagulation defect, unspecified: Secondary | ICD-10-CM | POA: Diagnosis not present

## 2016-09-27 DIAGNOSIS — D509 Iron deficiency anemia, unspecified: Secondary | ICD-10-CM | POA: Diagnosis not present

## 2016-09-27 DIAGNOSIS — D631 Anemia in chronic kidney disease: Secondary | ICD-10-CM | POA: Diagnosis not present

## 2016-09-27 DIAGNOSIS — N186 End stage renal disease: Secondary | ICD-10-CM | POA: Diagnosis not present

## 2016-09-27 DIAGNOSIS — Z23 Encounter for immunization: Secondary | ICD-10-CM | POA: Diagnosis not present

## 2016-09-27 DIAGNOSIS — N2581 Secondary hyperparathyroidism of renal origin: Secondary | ICD-10-CM | POA: Diagnosis not present

## 2016-09-27 DIAGNOSIS — E1129 Type 2 diabetes mellitus with other diabetic kidney complication: Secondary | ICD-10-CM | POA: Diagnosis not present

## 2016-09-29 DIAGNOSIS — D631 Anemia in chronic kidney disease: Secondary | ICD-10-CM | POA: Diagnosis not present

## 2016-09-29 DIAGNOSIS — Z7689 Persons encountering health services in other specified circumstances: Secondary | ICD-10-CM | POA: Diagnosis not present

## 2016-09-29 DIAGNOSIS — D689 Coagulation defect, unspecified: Secondary | ICD-10-CM | POA: Diagnosis not present

## 2016-09-29 DIAGNOSIS — D509 Iron deficiency anemia, unspecified: Secondary | ICD-10-CM | POA: Diagnosis not present

## 2016-09-29 DIAGNOSIS — N186 End stage renal disease: Secondary | ICD-10-CM | POA: Diagnosis not present

## 2016-09-29 DIAGNOSIS — E1129 Type 2 diabetes mellitus with other diabetic kidney complication: Secondary | ICD-10-CM | POA: Diagnosis not present

## 2016-09-29 DIAGNOSIS — Z23 Encounter for immunization: Secondary | ICD-10-CM | POA: Diagnosis not present

## 2016-09-29 DIAGNOSIS — N2581 Secondary hyperparathyroidism of renal origin: Secondary | ICD-10-CM | POA: Diagnosis not present

## 2016-10-02 DIAGNOSIS — Z23 Encounter for immunization: Secondary | ICD-10-CM | POA: Diagnosis not present

## 2016-10-02 DIAGNOSIS — D509 Iron deficiency anemia, unspecified: Secondary | ICD-10-CM | POA: Diagnosis not present

## 2016-10-02 DIAGNOSIS — D689 Coagulation defect, unspecified: Secondary | ICD-10-CM | POA: Diagnosis not present

## 2016-10-02 DIAGNOSIS — N186 End stage renal disease: Secondary | ICD-10-CM | POA: Diagnosis not present

## 2016-10-02 DIAGNOSIS — N2581 Secondary hyperparathyroidism of renal origin: Secondary | ICD-10-CM | POA: Diagnosis not present

## 2016-10-02 DIAGNOSIS — Z7689 Persons encountering health services in other specified circumstances: Secondary | ICD-10-CM | POA: Diagnosis not present

## 2016-10-02 DIAGNOSIS — D631 Anemia in chronic kidney disease: Secondary | ICD-10-CM | POA: Diagnosis not present

## 2016-10-02 DIAGNOSIS — E1129 Type 2 diabetes mellitus with other diabetic kidney complication: Secondary | ICD-10-CM | POA: Diagnosis not present

## 2016-10-04 DIAGNOSIS — Z23 Encounter for immunization: Secondary | ICD-10-CM | POA: Diagnosis not present

## 2016-10-04 DIAGNOSIS — E1129 Type 2 diabetes mellitus with other diabetic kidney complication: Secondary | ICD-10-CM | POA: Diagnosis not present

## 2016-10-04 DIAGNOSIS — Z7689 Persons encountering health services in other specified circumstances: Secondary | ICD-10-CM | POA: Diagnosis not present

## 2016-10-04 DIAGNOSIS — N186 End stage renal disease: Secondary | ICD-10-CM | POA: Diagnosis not present

## 2016-10-04 DIAGNOSIS — N2581 Secondary hyperparathyroidism of renal origin: Secondary | ICD-10-CM | POA: Diagnosis not present

## 2016-10-04 DIAGNOSIS — D631 Anemia in chronic kidney disease: Secondary | ICD-10-CM | POA: Diagnosis not present

## 2016-10-04 DIAGNOSIS — D689 Coagulation defect, unspecified: Secondary | ICD-10-CM | POA: Diagnosis not present

## 2016-10-04 DIAGNOSIS — D509 Iron deficiency anemia, unspecified: Secondary | ICD-10-CM | POA: Diagnosis not present

## 2016-10-06 DIAGNOSIS — D689 Coagulation defect, unspecified: Secondary | ICD-10-CM | POA: Diagnosis not present

## 2016-10-06 DIAGNOSIS — Z7689 Persons encountering health services in other specified circumstances: Secondary | ICD-10-CM | POA: Diagnosis not present

## 2016-10-06 DIAGNOSIS — N2581 Secondary hyperparathyroidism of renal origin: Secondary | ICD-10-CM | POA: Diagnosis not present

## 2016-10-06 DIAGNOSIS — D631 Anemia in chronic kidney disease: Secondary | ICD-10-CM | POA: Diagnosis not present

## 2016-10-06 DIAGNOSIS — Z23 Encounter for immunization: Secondary | ICD-10-CM | POA: Diagnosis not present

## 2016-10-06 DIAGNOSIS — N186 End stage renal disease: Secondary | ICD-10-CM | POA: Diagnosis not present

## 2016-10-06 DIAGNOSIS — E1129 Type 2 diabetes mellitus with other diabetic kidney complication: Secondary | ICD-10-CM | POA: Diagnosis not present

## 2016-10-06 DIAGNOSIS — D509 Iron deficiency anemia, unspecified: Secondary | ICD-10-CM | POA: Diagnosis not present

## 2016-10-09 DIAGNOSIS — Z23 Encounter for immunization: Secondary | ICD-10-CM | POA: Diagnosis not present

## 2016-10-09 DIAGNOSIS — N186 End stage renal disease: Secondary | ICD-10-CM | POA: Diagnosis not present

## 2016-10-09 DIAGNOSIS — D509 Iron deficiency anemia, unspecified: Secondary | ICD-10-CM | POA: Diagnosis not present

## 2016-10-09 DIAGNOSIS — E1129 Type 2 diabetes mellitus with other diabetic kidney complication: Secondary | ICD-10-CM | POA: Diagnosis not present

## 2016-10-09 DIAGNOSIS — D631 Anemia in chronic kidney disease: Secondary | ICD-10-CM | POA: Diagnosis not present

## 2016-10-09 DIAGNOSIS — N2581 Secondary hyperparathyroidism of renal origin: Secondary | ICD-10-CM | POA: Diagnosis not present

## 2016-10-09 DIAGNOSIS — Z7689 Persons encountering health services in other specified circumstances: Secondary | ICD-10-CM | POA: Diagnosis not present

## 2016-10-09 DIAGNOSIS — D689 Coagulation defect, unspecified: Secondary | ICD-10-CM | POA: Diagnosis not present

## 2016-10-10 DIAGNOSIS — Z992 Dependence on renal dialysis: Secondary | ICD-10-CM | POA: Diagnosis not present

## 2016-10-10 DIAGNOSIS — E1122 Type 2 diabetes mellitus with diabetic chronic kidney disease: Secondary | ICD-10-CM | POA: Diagnosis not present

## 2016-10-10 DIAGNOSIS — N186 End stage renal disease: Secondary | ICD-10-CM | POA: Diagnosis not present

## 2016-10-11 DIAGNOSIS — D631 Anemia in chronic kidney disease: Secondary | ICD-10-CM | POA: Diagnosis not present

## 2016-10-11 DIAGNOSIS — D509 Iron deficiency anemia, unspecified: Secondary | ICD-10-CM | POA: Diagnosis not present

## 2016-10-11 DIAGNOSIS — D689 Coagulation defect, unspecified: Secondary | ICD-10-CM | POA: Diagnosis not present

## 2016-10-11 DIAGNOSIS — E1129 Type 2 diabetes mellitus with other diabetic kidney complication: Secondary | ICD-10-CM | POA: Diagnosis not present

## 2016-10-11 DIAGNOSIS — N2581 Secondary hyperparathyroidism of renal origin: Secondary | ICD-10-CM | POA: Diagnosis not present

## 2016-10-11 DIAGNOSIS — E8779 Other fluid overload: Secondary | ICD-10-CM | POA: Diagnosis not present

## 2016-10-11 DIAGNOSIS — N186 End stage renal disease: Secondary | ICD-10-CM | POA: Diagnosis not present

## 2016-10-12 DIAGNOSIS — N186 End stage renal disease: Secondary | ICD-10-CM | POA: Diagnosis not present

## 2016-10-12 DIAGNOSIS — N2581 Secondary hyperparathyroidism of renal origin: Secondary | ICD-10-CM | POA: Diagnosis not present

## 2016-10-12 DIAGNOSIS — D631 Anemia in chronic kidney disease: Secondary | ICD-10-CM | POA: Diagnosis not present

## 2016-10-12 DIAGNOSIS — D689 Coagulation defect, unspecified: Secondary | ICD-10-CM | POA: Diagnosis not present

## 2016-10-12 DIAGNOSIS — D509 Iron deficiency anemia, unspecified: Secondary | ICD-10-CM | POA: Diagnosis not present

## 2016-10-12 DIAGNOSIS — E1122 Type 2 diabetes mellitus with diabetic chronic kidney disease: Secondary | ICD-10-CM | POA: Diagnosis not present

## 2016-10-13 DIAGNOSIS — D631 Anemia in chronic kidney disease: Secondary | ICD-10-CM | POA: Diagnosis not present

## 2016-10-13 DIAGNOSIS — E1129 Type 2 diabetes mellitus with other diabetic kidney complication: Secondary | ICD-10-CM | POA: Diagnosis not present

## 2016-10-13 DIAGNOSIS — N186 End stage renal disease: Secondary | ICD-10-CM | POA: Diagnosis not present

## 2016-10-13 DIAGNOSIS — N2581 Secondary hyperparathyroidism of renal origin: Secondary | ICD-10-CM | POA: Diagnosis not present

## 2016-10-13 DIAGNOSIS — D689 Coagulation defect, unspecified: Secondary | ICD-10-CM | POA: Diagnosis not present

## 2016-10-13 DIAGNOSIS — D509 Iron deficiency anemia, unspecified: Secondary | ICD-10-CM | POA: Diagnosis not present

## 2016-10-13 DIAGNOSIS — E8779 Other fluid overload: Secondary | ICD-10-CM | POA: Diagnosis not present

## 2016-10-15 DIAGNOSIS — N186 End stage renal disease: Secondary | ICD-10-CM | POA: Diagnosis not present

## 2016-10-15 DIAGNOSIS — D689 Coagulation defect, unspecified: Secondary | ICD-10-CM | POA: Diagnosis not present

## 2016-10-15 DIAGNOSIS — D509 Iron deficiency anemia, unspecified: Secondary | ICD-10-CM | POA: Diagnosis not present

## 2016-10-15 DIAGNOSIS — E1122 Type 2 diabetes mellitus with diabetic chronic kidney disease: Secondary | ICD-10-CM | POA: Diagnosis not present

## 2016-10-15 DIAGNOSIS — D631 Anemia in chronic kidney disease: Secondary | ICD-10-CM | POA: Diagnosis not present

## 2016-10-15 DIAGNOSIS — N2581 Secondary hyperparathyroidism of renal origin: Secondary | ICD-10-CM | POA: Diagnosis not present

## 2016-10-16 DIAGNOSIS — D509 Iron deficiency anemia, unspecified: Secondary | ICD-10-CM | POA: Diagnosis not present

## 2016-10-16 DIAGNOSIS — E8779 Other fluid overload: Secondary | ICD-10-CM | POA: Diagnosis not present

## 2016-10-16 DIAGNOSIS — N186 End stage renal disease: Secondary | ICD-10-CM | POA: Diagnosis not present

## 2016-10-16 DIAGNOSIS — N2581 Secondary hyperparathyroidism of renal origin: Secondary | ICD-10-CM | POA: Diagnosis not present

## 2016-10-16 DIAGNOSIS — D631 Anemia in chronic kidney disease: Secondary | ICD-10-CM | POA: Diagnosis not present

## 2016-10-16 DIAGNOSIS — D689 Coagulation defect, unspecified: Secondary | ICD-10-CM | POA: Diagnosis not present

## 2016-10-16 DIAGNOSIS — E1129 Type 2 diabetes mellitus with other diabetic kidney complication: Secondary | ICD-10-CM | POA: Diagnosis not present

## 2016-10-17 DIAGNOSIS — D509 Iron deficiency anemia, unspecified: Secondary | ICD-10-CM | POA: Diagnosis not present

## 2016-10-17 DIAGNOSIS — D631 Anemia in chronic kidney disease: Secondary | ICD-10-CM | POA: Diagnosis not present

## 2016-10-17 DIAGNOSIS — N2581 Secondary hyperparathyroidism of renal origin: Secondary | ICD-10-CM | POA: Diagnosis not present

## 2016-10-17 DIAGNOSIS — D689 Coagulation defect, unspecified: Secondary | ICD-10-CM | POA: Diagnosis not present

## 2016-10-17 DIAGNOSIS — E1122 Type 2 diabetes mellitus with diabetic chronic kidney disease: Secondary | ICD-10-CM | POA: Diagnosis not present

## 2016-10-17 DIAGNOSIS — N186 End stage renal disease: Secondary | ICD-10-CM | POA: Diagnosis not present

## 2016-10-17 DIAGNOSIS — E118 Type 2 diabetes mellitus with unspecified complications: Secondary | ICD-10-CM | POA: Diagnosis not present

## 2016-10-18 DIAGNOSIS — D689 Coagulation defect, unspecified: Secondary | ICD-10-CM | POA: Diagnosis not present

## 2016-10-18 DIAGNOSIS — D509 Iron deficiency anemia, unspecified: Secondary | ICD-10-CM | POA: Diagnosis not present

## 2016-10-18 DIAGNOSIS — N2581 Secondary hyperparathyroidism of renal origin: Secondary | ICD-10-CM | POA: Diagnosis not present

## 2016-10-18 DIAGNOSIS — E8779 Other fluid overload: Secondary | ICD-10-CM | POA: Diagnosis not present

## 2016-10-18 DIAGNOSIS — E1129 Type 2 diabetes mellitus with other diabetic kidney complication: Secondary | ICD-10-CM | POA: Diagnosis not present

## 2016-10-18 DIAGNOSIS — D631 Anemia in chronic kidney disease: Secondary | ICD-10-CM | POA: Diagnosis not present

## 2016-10-18 DIAGNOSIS — N186 End stage renal disease: Secondary | ICD-10-CM | POA: Diagnosis not present

## 2016-10-19 DIAGNOSIS — D631 Anemia in chronic kidney disease: Secondary | ICD-10-CM | POA: Diagnosis not present

## 2016-10-19 DIAGNOSIS — N2581 Secondary hyperparathyroidism of renal origin: Secondary | ICD-10-CM | POA: Diagnosis not present

## 2016-10-19 DIAGNOSIS — N186 End stage renal disease: Secondary | ICD-10-CM | POA: Diagnosis not present

## 2016-10-19 DIAGNOSIS — E1122 Type 2 diabetes mellitus with diabetic chronic kidney disease: Secondary | ICD-10-CM | POA: Diagnosis not present

## 2016-10-19 DIAGNOSIS — D509 Iron deficiency anemia, unspecified: Secondary | ICD-10-CM | POA: Diagnosis not present

## 2016-10-19 DIAGNOSIS — D689 Coagulation defect, unspecified: Secondary | ICD-10-CM | POA: Diagnosis not present

## 2016-10-20 DIAGNOSIS — N186 End stage renal disease: Secondary | ICD-10-CM | POA: Diagnosis not present

## 2016-10-20 DIAGNOSIS — D509 Iron deficiency anemia, unspecified: Secondary | ICD-10-CM | POA: Diagnosis not present

## 2016-10-20 DIAGNOSIS — E1129 Type 2 diabetes mellitus with other diabetic kidney complication: Secondary | ICD-10-CM | POA: Diagnosis not present

## 2016-10-20 DIAGNOSIS — D689 Coagulation defect, unspecified: Secondary | ICD-10-CM | POA: Diagnosis not present

## 2016-10-20 DIAGNOSIS — E8779 Other fluid overload: Secondary | ICD-10-CM | POA: Diagnosis not present

## 2016-10-20 DIAGNOSIS — N2581 Secondary hyperparathyroidism of renal origin: Secondary | ICD-10-CM | POA: Diagnosis not present

## 2016-10-20 DIAGNOSIS — D631 Anemia in chronic kidney disease: Secondary | ICD-10-CM | POA: Diagnosis not present

## 2016-10-22 DIAGNOSIS — D689 Coagulation defect, unspecified: Secondary | ICD-10-CM | POA: Diagnosis not present

## 2016-10-22 DIAGNOSIS — N186 End stage renal disease: Secondary | ICD-10-CM | POA: Diagnosis not present

## 2016-10-22 DIAGNOSIS — D509 Iron deficiency anemia, unspecified: Secondary | ICD-10-CM | POA: Diagnosis not present

## 2016-10-22 DIAGNOSIS — E1122 Type 2 diabetes mellitus with diabetic chronic kidney disease: Secondary | ICD-10-CM | POA: Diagnosis not present

## 2016-10-22 DIAGNOSIS — N2581 Secondary hyperparathyroidism of renal origin: Secondary | ICD-10-CM | POA: Diagnosis not present

## 2016-10-22 DIAGNOSIS — D631 Anemia in chronic kidney disease: Secondary | ICD-10-CM | POA: Diagnosis not present

## 2016-10-23 DIAGNOSIS — N2581 Secondary hyperparathyroidism of renal origin: Secondary | ICD-10-CM | POA: Diagnosis not present

## 2016-10-23 DIAGNOSIS — N186 End stage renal disease: Secondary | ICD-10-CM | POA: Diagnosis not present

## 2016-10-23 DIAGNOSIS — E8779 Other fluid overload: Secondary | ICD-10-CM | POA: Diagnosis not present

## 2016-10-23 DIAGNOSIS — D509 Iron deficiency anemia, unspecified: Secondary | ICD-10-CM | POA: Diagnosis not present

## 2016-10-23 DIAGNOSIS — E1129 Type 2 diabetes mellitus with other diabetic kidney complication: Secondary | ICD-10-CM | POA: Diagnosis not present

## 2016-10-23 DIAGNOSIS — D689 Coagulation defect, unspecified: Secondary | ICD-10-CM | POA: Diagnosis not present

## 2016-10-23 DIAGNOSIS — D631 Anemia in chronic kidney disease: Secondary | ICD-10-CM | POA: Diagnosis not present

## 2016-10-24 DIAGNOSIS — D631 Anemia in chronic kidney disease: Secondary | ICD-10-CM | POA: Diagnosis not present

## 2016-10-24 DIAGNOSIS — N2581 Secondary hyperparathyroidism of renal origin: Secondary | ICD-10-CM | POA: Diagnosis not present

## 2016-10-24 DIAGNOSIS — D689 Coagulation defect, unspecified: Secondary | ICD-10-CM | POA: Diagnosis not present

## 2016-10-24 DIAGNOSIS — N186 End stage renal disease: Secondary | ICD-10-CM | POA: Diagnosis not present

## 2016-10-24 DIAGNOSIS — E1129 Type 2 diabetes mellitus with other diabetic kidney complication: Secondary | ICD-10-CM | POA: Diagnosis not present

## 2016-10-24 DIAGNOSIS — E1122 Type 2 diabetes mellitus with diabetic chronic kidney disease: Secondary | ICD-10-CM | POA: Diagnosis not present

## 2016-10-24 DIAGNOSIS — D509 Iron deficiency anemia, unspecified: Secondary | ICD-10-CM | POA: Diagnosis not present

## 2016-10-24 DIAGNOSIS — E8779 Other fluid overload: Secondary | ICD-10-CM | POA: Diagnosis not present

## 2016-10-25 DIAGNOSIS — N186 End stage renal disease: Secondary | ICD-10-CM | POA: Diagnosis not present

## 2016-10-25 DIAGNOSIS — D689 Coagulation defect, unspecified: Secondary | ICD-10-CM | POA: Diagnosis not present

## 2016-10-25 DIAGNOSIS — D631 Anemia in chronic kidney disease: Secondary | ICD-10-CM | POA: Diagnosis not present

## 2016-10-25 DIAGNOSIS — E8779 Other fluid overload: Secondary | ICD-10-CM | POA: Diagnosis not present

## 2016-10-25 DIAGNOSIS — E1129 Type 2 diabetes mellitus with other diabetic kidney complication: Secondary | ICD-10-CM | POA: Diagnosis not present

## 2016-10-25 DIAGNOSIS — N2581 Secondary hyperparathyroidism of renal origin: Secondary | ICD-10-CM | POA: Diagnosis not present

## 2016-10-25 DIAGNOSIS — D509 Iron deficiency anemia, unspecified: Secondary | ICD-10-CM | POA: Diagnosis not present

## 2016-10-26 DIAGNOSIS — E1122 Type 2 diabetes mellitus with diabetic chronic kidney disease: Secondary | ICD-10-CM | POA: Diagnosis not present

## 2016-10-26 DIAGNOSIS — N2581 Secondary hyperparathyroidism of renal origin: Secondary | ICD-10-CM | POA: Diagnosis not present

## 2016-10-26 DIAGNOSIS — D689 Coagulation defect, unspecified: Secondary | ICD-10-CM | POA: Diagnosis not present

## 2016-10-26 DIAGNOSIS — N186 End stage renal disease: Secondary | ICD-10-CM | POA: Diagnosis not present

## 2016-10-26 DIAGNOSIS — D509 Iron deficiency anemia, unspecified: Secondary | ICD-10-CM | POA: Diagnosis not present

## 2016-10-26 DIAGNOSIS — D631 Anemia in chronic kidney disease: Secondary | ICD-10-CM | POA: Diagnosis not present

## 2016-10-27 DIAGNOSIS — N2581 Secondary hyperparathyroidism of renal origin: Secondary | ICD-10-CM | POA: Diagnosis not present

## 2016-10-27 DIAGNOSIS — N186 End stage renal disease: Secondary | ICD-10-CM | POA: Diagnosis not present

## 2016-10-27 DIAGNOSIS — D509 Iron deficiency anemia, unspecified: Secondary | ICD-10-CM | POA: Diagnosis not present

## 2016-10-27 DIAGNOSIS — E8779 Other fluid overload: Secondary | ICD-10-CM | POA: Diagnosis not present

## 2016-10-27 DIAGNOSIS — D689 Coagulation defect, unspecified: Secondary | ICD-10-CM | POA: Diagnosis not present

## 2016-10-27 DIAGNOSIS — E1129 Type 2 diabetes mellitus with other diabetic kidney complication: Secondary | ICD-10-CM | POA: Diagnosis not present

## 2016-10-27 DIAGNOSIS — D631 Anemia in chronic kidney disease: Secondary | ICD-10-CM | POA: Diagnosis not present

## 2016-10-29 DIAGNOSIS — D631 Anemia in chronic kidney disease: Secondary | ICD-10-CM | POA: Diagnosis not present

## 2016-10-29 DIAGNOSIS — E1122 Type 2 diabetes mellitus with diabetic chronic kidney disease: Secondary | ICD-10-CM | POA: Diagnosis not present

## 2016-10-29 DIAGNOSIS — N186 End stage renal disease: Secondary | ICD-10-CM | POA: Diagnosis not present

## 2016-10-29 DIAGNOSIS — D509 Iron deficiency anemia, unspecified: Secondary | ICD-10-CM | POA: Diagnosis not present

## 2016-10-29 DIAGNOSIS — N2581 Secondary hyperparathyroidism of renal origin: Secondary | ICD-10-CM | POA: Diagnosis not present

## 2016-10-29 DIAGNOSIS — D689 Coagulation defect, unspecified: Secondary | ICD-10-CM | POA: Diagnosis not present

## 2016-10-30 DIAGNOSIS — N186 End stage renal disease: Secondary | ICD-10-CM | POA: Diagnosis not present

## 2016-10-30 DIAGNOSIS — D689 Coagulation defect, unspecified: Secondary | ICD-10-CM | POA: Diagnosis not present

## 2016-10-30 DIAGNOSIS — N2581 Secondary hyperparathyroidism of renal origin: Secondary | ICD-10-CM | POA: Diagnosis not present

## 2016-10-30 DIAGNOSIS — E1129 Type 2 diabetes mellitus with other diabetic kidney complication: Secondary | ICD-10-CM | POA: Diagnosis not present

## 2016-10-30 DIAGNOSIS — D631 Anemia in chronic kidney disease: Secondary | ICD-10-CM | POA: Diagnosis not present

## 2016-10-30 DIAGNOSIS — D509 Iron deficiency anemia, unspecified: Secondary | ICD-10-CM | POA: Diagnosis not present

## 2016-10-30 DIAGNOSIS — E8779 Other fluid overload: Secondary | ICD-10-CM | POA: Diagnosis not present

## 2016-10-31 DIAGNOSIS — N186 End stage renal disease: Secondary | ICD-10-CM | POA: Diagnosis not present

## 2016-10-31 DIAGNOSIS — E1122 Type 2 diabetes mellitus with diabetic chronic kidney disease: Secondary | ICD-10-CM | POA: Diagnosis not present

## 2016-10-31 DIAGNOSIS — D509 Iron deficiency anemia, unspecified: Secondary | ICD-10-CM | POA: Diagnosis not present

## 2016-10-31 DIAGNOSIS — D631 Anemia in chronic kidney disease: Secondary | ICD-10-CM | POA: Diagnosis not present

## 2016-10-31 DIAGNOSIS — D689 Coagulation defect, unspecified: Secondary | ICD-10-CM | POA: Diagnosis not present

## 2016-10-31 DIAGNOSIS — N2581 Secondary hyperparathyroidism of renal origin: Secondary | ICD-10-CM | POA: Diagnosis not present

## 2016-11-01 DIAGNOSIS — D509 Iron deficiency anemia, unspecified: Secondary | ICD-10-CM | POA: Diagnosis not present

## 2016-11-01 DIAGNOSIS — D631 Anemia in chronic kidney disease: Secondary | ICD-10-CM | POA: Diagnosis not present

## 2016-11-01 DIAGNOSIS — D689 Coagulation defect, unspecified: Secondary | ICD-10-CM | POA: Diagnosis not present

## 2016-11-01 DIAGNOSIS — E1129 Type 2 diabetes mellitus with other diabetic kidney complication: Secondary | ICD-10-CM | POA: Diagnosis not present

## 2016-11-01 DIAGNOSIS — E8779 Other fluid overload: Secondary | ICD-10-CM | POA: Diagnosis not present

## 2016-11-01 DIAGNOSIS — N186 End stage renal disease: Secondary | ICD-10-CM | POA: Diagnosis not present

## 2016-11-01 DIAGNOSIS — N2581 Secondary hyperparathyroidism of renal origin: Secondary | ICD-10-CM | POA: Diagnosis not present

## 2016-11-02 DIAGNOSIS — E1122 Type 2 diabetes mellitus with diabetic chronic kidney disease: Secondary | ICD-10-CM | POA: Diagnosis not present

## 2016-11-02 DIAGNOSIS — D509 Iron deficiency anemia, unspecified: Secondary | ICD-10-CM | POA: Diagnosis not present

## 2016-11-02 DIAGNOSIS — Z452 Encounter for adjustment and management of vascular access device: Secondary | ICD-10-CM | POA: Diagnosis not present

## 2016-11-02 DIAGNOSIS — N2581 Secondary hyperparathyroidism of renal origin: Secondary | ICD-10-CM | POA: Diagnosis not present

## 2016-11-02 DIAGNOSIS — N186 End stage renal disease: Secondary | ICD-10-CM | POA: Diagnosis not present

## 2016-11-02 DIAGNOSIS — D689 Coagulation defect, unspecified: Secondary | ICD-10-CM | POA: Diagnosis not present

## 2016-11-02 DIAGNOSIS — D631 Anemia in chronic kidney disease: Secondary | ICD-10-CM | POA: Diagnosis not present

## 2016-11-03 DIAGNOSIS — D509 Iron deficiency anemia, unspecified: Secondary | ICD-10-CM | POA: Diagnosis not present

## 2016-11-03 DIAGNOSIS — E1129 Type 2 diabetes mellitus with other diabetic kidney complication: Secondary | ICD-10-CM | POA: Diagnosis not present

## 2016-11-03 DIAGNOSIS — N186 End stage renal disease: Secondary | ICD-10-CM | POA: Diagnosis not present

## 2016-11-03 DIAGNOSIS — D631 Anemia in chronic kidney disease: Secondary | ICD-10-CM | POA: Diagnosis not present

## 2016-11-03 DIAGNOSIS — D689 Coagulation defect, unspecified: Secondary | ICD-10-CM | POA: Diagnosis not present

## 2016-11-03 DIAGNOSIS — E8779 Other fluid overload: Secondary | ICD-10-CM | POA: Diagnosis not present

## 2016-11-03 DIAGNOSIS — N2581 Secondary hyperparathyroidism of renal origin: Secondary | ICD-10-CM | POA: Diagnosis not present

## 2016-11-05 DIAGNOSIS — D631 Anemia in chronic kidney disease: Secondary | ICD-10-CM | POA: Diagnosis not present

## 2016-11-05 DIAGNOSIS — D689 Coagulation defect, unspecified: Secondary | ICD-10-CM | POA: Diagnosis not present

## 2016-11-05 DIAGNOSIS — N186 End stage renal disease: Secondary | ICD-10-CM | POA: Diagnosis not present

## 2016-11-05 DIAGNOSIS — E1122 Type 2 diabetes mellitus with diabetic chronic kidney disease: Secondary | ICD-10-CM | POA: Diagnosis not present

## 2016-11-05 DIAGNOSIS — N2581 Secondary hyperparathyroidism of renal origin: Secondary | ICD-10-CM | POA: Diagnosis not present

## 2016-11-05 DIAGNOSIS — D509 Iron deficiency anemia, unspecified: Secondary | ICD-10-CM | POA: Diagnosis not present

## 2016-11-06 DIAGNOSIS — N2581 Secondary hyperparathyroidism of renal origin: Secondary | ICD-10-CM | POA: Diagnosis not present

## 2016-11-06 DIAGNOSIS — D509 Iron deficiency anemia, unspecified: Secondary | ICD-10-CM | POA: Diagnosis not present

## 2016-11-06 DIAGNOSIS — D631 Anemia in chronic kidney disease: Secondary | ICD-10-CM | POA: Diagnosis not present

## 2016-11-06 DIAGNOSIS — N186 End stage renal disease: Secondary | ICD-10-CM | POA: Diagnosis not present

## 2016-11-06 DIAGNOSIS — D689 Coagulation defect, unspecified: Secondary | ICD-10-CM | POA: Diagnosis not present

## 2016-11-06 DIAGNOSIS — E8779 Other fluid overload: Secondary | ICD-10-CM | POA: Diagnosis not present

## 2016-11-06 DIAGNOSIS — E1129 Type 2 diabetes mellitus with other diabetic kidney complication: Secondary | ICD-10-CM | POA: Diagnosis not present

## 2016-11-07 DIAGNOSIS — D689 Coagulation defect, unspecified: Secondary | ICD-10-CM | POA: Diagnosis not present

## 2016-11-07 DIAGNOSIS — D631 Anemia in chronic kidney disease: Secondary | ICD-10-CM | POA: Diagnosis not present

## 2016-11-07 DIAGNOSIS — N186 End stage renal disease: Secondary | ICD-10-CM | POA: Diagnosis not present

## 2016-11-07 DIAGNOSIS — E1122 Type 2 diabetes mellitus with diabetic chronic kidney disease: Secondary | ICD-10-CM | POA: Diagnosis not present

## 2016-11-07 DIAGNOSIS — D509 Iron deficiency anemia, unspecified: Secondary | ICD-10-CM | POA: Diagnosis not present

## 2016-11-07 DIAGNOSIS — N2581 Secondary hyperparathyroidism of renal origin: Secondary | ICD-10-CM | POA: Diagnosis not present

## 2016-11-07 DIAGNOSIS — Z992 Dependence on renal dialysis: Secondary | ICD-10-CM | POA: Diagnosis not present

## 2016-11-08 DIAGNOSIS — D689 Coagulation defect, unspecified: Secondary | ICD-10-CM | POA: Diagnosis not present

## 2016-11-08 DIAGNOSIS — D631 Anemia in chronic kidney disease: Secondary | ICD-10-CM | POA: Diagnosis not present

## 2016-11-08 DIAGNOSIS — N2581 Secondary hyperparathyroidism of renal origin: Secondary | ICD-10-CM | POA: Diagnosis not present

## 2016-11-08 DIAGNOSIS — E1129 Type 2 diabetes mellitus with other diabetic kidney complication: Secondary | ICD-10-CM | POA: Diagnosis not present

## 2016-11-08 DIAGNOSIS — N186 End stage renal disease: Secondary | ICD-10-CM | POA: Diagnosis not present

## 2016-11-10 DIAGNOSIS — E1129 Type 2 diabetes mellitus with other diabetic kidney complication: Secondary | ICD-10-CM | POA: Diagnosis not present

## 2016-11-10 DIAGNOSIS — N186 End stage renal disease: Secondary | ICD-10-CM | POA: Diagnosis not present

## 2016-11-10 DIAGNOSIS — D631 Anemia in chronic kidney disease: Secondary | ICD-10-CM | POA: Diagnosis not present

## 2016-11-10 DIAGNOSIS — D689 Coagulation defect, unspecified: Secondary | ICD-10-CM | POA: Diagnosis not present

## 2016-11-10 DIAGNOSIS — N2581 Secondary hyperparathyroidism of renal origin: Secondary | ICD-10-CM | POA: Diagnosis not present

## 2016-11-12 DIAGNOSIS — E1129 Type 2 diabetes mellitus with other diabetic kidney complication: Secondary | ICD-10-CM | POA: Diagnosis not present

## 2016-11-12 DIAGNOSIS — D689 Coagulation defect, unspecified: Secondary | ICD-10-CM | POA: Diagnosis not present

## 2016-11-12 DIAGNOSIS — N2581 Secondary hyperparathyroidism of renal origin: Secondary | ICD-10-CM | POA: Diagnosis not present

## 2016-11-12 DIAGNOSIS — D631 Anemia in chronic kidney disease: Secondary | ICD-10-CM | POA: Diagnosis not present

## 2016-11-12 DIAGNOSIS — N186 End stage renal disease: Secondary | ICD-10-CM | POA: Diagnosis not present

## 2016-11-13 DIAGNOSIS — D689 Coagulation defect, unspecified: Secondary | ICD-10-CM | POA: Diagnosis not present

## 2016-11-13 DIAGNOSIS — N186 End stage renal disease: Secondary | ICD-10-CM | POA: Diagnosis not present

## 2016-11-13 DIAGNOSIS — D631 Anemia in chronic kidney disease: Secondary | ICD-10-CM | POA: Diagnosis not present

## 2016-11-13 DIAGNOSIS — N2581 Secondary hyperparathyroidism of renal origin: Secondary | ICD-10-CM | POA: Diagnosis not present

## 2016-11-13 DIAGNOSIS — E1129 Type 2 diabetes mellitus with other diabetic kidney complication: Secondary | ICD-10-CM | POA: Diagnosis not present

## 2016-11-15 DIAGNOSIS — E1129 Type 2 diabetes mellitus with other diabetic kidney complication: Secondary | ICD-10-CM | POA: Diagnosis not present

## 2016-11-15 DIAGNOSIS — N2581 Secondary hyperparathyroidism of renal origin: Secondary | ICD-10-CM | POA: Diagnosis not present

## 2016-11-15 DIAGNOSIS — D631 Anemia in chronic kidney disease: Secondary | ICD-10-CM | POA: Diagnosis not present

## 2016-11-15 DIAGNOSIS — D689 Coagulation defect, unspecified: Secondary | ICD-10-CM | POA: Diagnosis not present

## 2016-11-15 DIAGNOSIS — N186 End stage renal disease: Secondary | ICD-10-CM | POA: Diagnosis not present

## 2016-11-17 DIAGNOSIS — E1129 Type 2 diabetes mellitus with other diabetic kidney complication: Secondary | ICD-10-CM | POA: Diagnosis not present

## 2016-11-17 DIAGNOSIS — D689 Coagulation defect, unspecified: Secondary | ICD-10-CM | POA: Diagnosis not present

## 2016-11-17 DIAGNOSIS — N186 End stage renal disease: Secondary | ICD-10-CM | POA: Diagnosis not present

## 2016-11-17 DIAGNOSIS — D631 Anemia in chronic kidney disease: Secondary | ICD-10-CM | POA: Diagnosis not present

## 2016-11-17 DIAGNOSIS — N2581 Secondary hyperparathyroidism of renal origin: Secondary | ICD-10-CM | POA: Diagnosis not present

## 2016-11-20 DIAGNOSIS — N186 End stage renal disease: Secondary | ICD-10-CM | POA: Diagnosis not present

## 2016-11-20 DIAGNOSIS — D689 Coagulation defect, unspecified: Secondary | ICD-10-CM | POA: Diagnosis not present

## 2016-11-20 DIAGNOSIS — D631 Anemia in chronic kidney disease: Secondary | ICD-10-CM | POA: Diagnosis not present

## 2016-11-20 DIAGNOSIS — E1129 Type 2 diabetes mellitus with other diabetic kidney complication: Secondary | ICD-10-CM | POA: Diagnosis not present

## 2016-11-20 DIAGNOSIS — N2581 Secondary hyperparathyroidism of renal origin: Secondary | ICD-10-CM | POA: Diagnosis not present

## 2016-11-22 DIAGNOSIS — N186 End stage renal disease: Secondary | ICD-10-CM | POA: Diagnosis not present

## 2016-11-22 DIAGNOSIS — D689 Coagulation defect, unspecified: Secondary | ICD-10-CM | POA: Diagnosis not present

## 2016-11-22 DIAGNOSIS — E1129 Type 2 diabetes mellitus with other diabetic kidney complication: Secondary | ICD-10-CM | POA: Diagnosis not present

## 2016-11-22 DIAGNOSIS — D631 Anemia in chronic kidney disease: Secondary | ICD-10-CM | POA: Diagnosis not present

## 2016-11-22 DIAGNOSIS — N2581 Secondary hyperparathyroidism of renal origin: Secondary | ICD-10-CM | POA: Diagnosis not present

## 2016-11-24 DIAGNOSIS — N2581 Secondary hyperparathyroidism of renal origin: Secondary | ICD-10-CM | POA: Diagnosis not present

## 2016-11-24 DIAGNOSIS — D689 Coagulation defect, unspecified: Secondary | ICD-10-CM | POA: Diagnosis not present

## 2016-11-24 DIAGNOSIS — D631 Anemia in chronic kidney disease: Secondary | ICD-10-CM | POA: Diagnosis not present

## 2016-11-24 DIAGNOSIS — N186 End stage renal disease: Secondary | ICD-10-CM | POA: Diagnosis not present

## 2016-11-24 DIAGNOSIS — E1129 Type 2 diabetes mellitus with other diabetic kidney complication: Secondary | ICD-10-CM | POA: Diagnosis not present

## 2016-11-27 DIAGNOSIS — D631 Anemia in chronic kidney disease: Secondary | ICD-10-CM | POA: Diagnosis not present

## 2016-11-27 DIAGNOSIS — D689 Coagulation defect, unspecified: Secondary | ICD-10-CM | POA: Diagnosis not present

## 2016-11-27 DIAGNOSIS — E1129 Type 2 diabetes mellitus with other diabetic kidney complication: Secondary | ICD-10-CM | POA: Diagnosis not present

## 2016-11-27 DIAGNOSIS — N2581 Secondary hyperparathyroidism of renal origin: Secondary | ICD-10-CM | POA: Diagnosis not present

## 2016-11-27 DIAGNOSIS — N186 End stage renal disease: Secondary | ICD-10-CM | POA: Diagnosis not present

## 2016-11-29 DIAGNOSIS — D631 Anemia in chronic kidney disease: Secondary | ICD-10-CM | POA: Diagnosis not present

## 2016-11-29 DIAGNOSIS — N186 End stage renal disease: Secondary | ICD-10-CM | POA: Diagnosis not present

## 2016-11-29 DIAGNOSIS — N2581 Secondary hyperparathyroidism of renal origin: Secondary | ICD-10-CM | POA: Diagnosis not present

## 2016-11-29 DIAGNOSIS — E1129 Type 2 diabetes mellitus with other diabetic kidney complication: Secondary | ICD-10-CM | POA: Diagnosis not present

## 2016-11-29 DIAGNOSIS — D689 Coagulation defect, unspecified: Secondary | ICD-10-CM | POA: Diagnosis not present

## 2016-12-01 DIAGNOSIS — N2581 Secondary hyperparathyroidism of renal origin: Secondary | ICD-10-CM | POA: Diagnosis not present

## 2016-12-01 DIAGNOSIS — D689 Coagulation defect, unspecified: Secondary | ICD-10-CM | POA: Diagnosis not present

## 2016-12-01 DIAGNOSIS — N186 End stage renal disease: Secondary | ICD-10-CM | POA: Diagnosis not present

## 2016-12-01 DIAGNOSIS — E1129 Type 2 diabetes mellitus with other diabetic kidney complication: Secondary | ICD-10-CM | POA: Diagnosis not present

## 2016-12-01 DIAGNOSIS — D631 Anemia in chronic kidney disease: Secondary | ICD-10-CM | POA: Diagnosis not present

## 2016-12-04 DIAGNOSIS — D689 Coagulation defect, unspecified: Secondary | ICD-10-CM | POA: Diagnosis not present

## 2016-12-04 DIAGNOSIS — N186 End stage renal disease: Secondary | ICD-10-CM | POA: Diagnosis not present

## 2016-12-04 DIAGNOSIS — N2581 Secondary hyperparathyroidism of renal origin: Secondary | ICD-10-CM | POA: Diagnosis not present

## 2016-12-04 DIAGNOSIS — D631 Anemia in chronic kidney disease: Secondary | ICD-10-CM | POA: Diagnosis not present

## 2016-12-04 DIAGNOSIS — E1129 Type 2 diabetes mellitus with other diabetic kidney complication: Secondary | ICD-10-CM | POA: Diagnosis not present

## 2016-12-06 DIAGNOSIS — D689 Coagulation defect, unspecified: Secondary | ICD-10-CM | POA: Diagnosis not present

## 2016-12-06 DIAGNOSIS — E1129 Type 2 diabetes mellitus with other diabetic kidney complication: Secondary | ICD-10-CM | POA: Diagnosis not present

## 2016-12-06 DIAGNOSIS — N186 End stage renal disease: Secondary | ICD-10-CM | POA: Diagnosis not present

## 2016-12-06 DIAGNOSIS — N2581 Secondary hyperparathyroidism of renal origin: Secondary | ICD-10-CM | POA: Diagnosis not present

## 2016-12-06 DIAGNOSIS — D631 Anemia in chronic kidney disease: Secondary | ICD-10-CM | POA: Diagnosis not present

## 2016-12-08 DIAGNOSIS — Z992 Dependence on renal dialysis: Secondary | ICD-10-CM | POA: Diagnosis not present

## 2016-12-08 DIAGNOSIS — E1129 Type 2 diabetes mellitus with other diabetic kidney complication: Secondary | ICD-10-CM | POA: Diagnosis not present

## 2016-12-08 DIAGNOSIS — N2581 Secondary hyperparathyroidism of renal origin: Secondary | ICD-10-CM | POA: Diagnosis not present

## 2016-12-08 DIAGNOSIS — D689 Coagulation defect, unspecified: Secondary | ICD-10-CM | POA: Diagnosis not present

## 2016-12-08 DIAGNOSIS — D631 Anemia in chronic kidney disease: Secondary | ICD-10-CM | POA: Diagnosis not present

## 2016-12-08 DIAGNOSIS — E1122 Type 2 diabetes mellitus with diabetic chronic kidney disease: Secondary | ICD-10-CM | POA: Diagnosis not present

## 2016-12-08 DIAGNOSIS — N186 End stage renal disease: Secondary | ICD-10-CM | POA: Diagnosis not present

## 2016-12-11 DIAGNOSIS — E8779 Other fluid overload: Secondary | ICD-10-CM | POA: Diagnosis not present

## 2016-12-11 DIAGNOSIS — N2581 Secondary hyperparathyroidism of renal origin: Secondary | ICD-10-CM | POA: Diagnosis not present

## 2016-12-11 DIAGNOSIS — D689 Coagulation defect, unspecified: Secondary | ICD-10-CM | POA: Diagnosis not present

## 2016-12-11 DIAGNOSIS — E1129 Type 2 diabetes mellitus with other diabetic kidney complication: Secondary | ICD-10-CM | POA: Diagnosis not present

## 2016-12-11 DIAGNOSIS — N186 End stage renal disease: Secondary | ICD-10-CM | POA: Diagnosis not present

## 2016-12-11 DIAGNOSIS — D631 Anemia in chronic kidney disease: Secondary | ICD-10-CM | POA: Diagnosis not present

## 2016-12-11 DIAGNOSIS — Z7689 Persons encountering health services in other specified circumstances: Secondary | ICD-10-CM | POA: Diagnosis not present

## 2016-12-13 DIAGNOSIS — N2581 Secondary hyperparathyroidism of renal origin: Secondary | ICD-10-CM | POA: Diagnosis not present

## 2016-12-13 DIAGNOSIS — Z7689 Persons encountering health services in other specified circumstances: Secondary | ICD-10-CM | POA: Diagnosis not present

## 2016-12-13 DIAGNOSIS — D689 Coagulation defect, unspecified: Secondary | ICD-10-CM | POA: Diagnosis not present

## 2016-12-13 DIAGNOSIS — E8779 Other fluid overload: Secondary | ICD-10-CM | POA: Diagnosis not present

## 2016-12-13 DIAGNOSIS — E1129 Type 2 diabetes mellitus with other diabetic kidney complication: Secondary | ICD-10-CM | POA: Diagnosis not present

## 2016-12-13 DIAGNOSIS — N186 End stage renal disease: Secondary | ICD-10-CM | POA: Diagnosis not present

## 2016-12-13 DIAGNOSIS — D631 Anemia in chronic kidney disease: Secondary | ICD-10-CM | POA: Diagnosis not present

## 2016-12-15 DIAGNOSIS — D631 Anemia in chronic kidney disease: Secondary | ICD-10-CM | POA: Diagnosis not present

## 2016-12-15 DIAGNOSIS — N2581 Secondary hyperparathyroidism of renal origin: Secondary | ICD-10-CM | POA: Diagnosis not present

## 2016-12-15 DIAGNOSIS — D689 Coagulation defect, unspecified: Secondary | ICD-10-CM | POA: Diagnosis not present

## 2016-12-15 DIAGNOSIS — N186 End stage renal disease: Secondary | ICD-10-CM | POA: Diagnosis not present

## 2016-12-15 DIAGNOSIS — E1129 Type 2 diabetes mellitus with other diabetic kidney complication: Secondary | ICD-10-CM | POA: Diagnosis not present

## 2016-12-15 DIAGNOSIS — Z7689 Persons encountering health services in other specified circumstances: Secondary | ICD-10-CM | POA: Diagnosis not present

## 2016-12-15 DIAGNOSIS — E8779 Other fluid overload: Secondary | ICD-10-CM | POA: Diagnosis not present

## 2016-12-17 ENCOUNTER — Emergency Department (HOSPITAL_COMMUNITY)
Admission: EM | Admit: 2016-12-17 | Discharge: 2016-12-17 | Disposition: A | Discharge status: Home / Self Care | Payer: Medicare Other | Attending: Emergency Medicine | Admitting: Emergency Medicine

## 2016-12-17 ENCOUNTER — Encounter (HOSPITAL_COMMUNITY): Payer: Self-pay | Admitting: Emergency Medicine

## 2016-12-17 DIAGNOSIS — T23222A Burn of second degree of single left finger (nail) except thumb, initial encounter: Secondary | ICD-10-CM | POA: Insufficient documentation

## 2016-12-17 DIAGNOSIS — Y93G3 Activity, cooking and baking: Secondary | ICD-10-CM | POA: Diagnosis not present

## 2016-12-17 DIAGNOSIS — Y998 Other external cause status: Secondary | ICD-10-CM | POA: Diagnosis not present

## 2016-12-17 DIAGNOSIS — T310 Burns involving less than 10% of body surface: Secondary | ICD-10-CM | POA: Diagnosis not present

## 2016-12-17 DIAGNOSIS — T300 Burn of unspecified body region, unspecified degree: Secondary | ICD-10-CM

## 2016-12-17 DIAGNOSIS — T23022A Burn of unspecified degree of single left finger (nail) except thumb, initial encounter: Secondary | ICD-10-CM | POA: Diagnosis present

## 2016-12-17 DIAGNOSIS — N184 Chronic kidney disease, stage 4 (severe): Secondary | ICD-10-CM | POA: Diagnosis not present

## 2016-12-17 DIAGNOSIS — E039 Hypothyroidism, unspecified: Secondary | ICD-10-CM | POA: Insufficient documentation

## 2016-12-17 DIAGNOSIS — E1122 Type 2 diabetes mellitus with diabetic chronic kidney disease: Secondary | ICD-10-CM | POA: Insufficient documentation

## 2016-12-17 DIAGNOSIS — X131XXA Other contact with steam and other hot vapors, initial encounter: Secondary | ICD-10-CM | POA: Insufficient documentation

## 2016-12-17 DIAGNOSIS — I130 Hypertensive heart and chronic kidney disease with heart failure and stage 1 through stage 4 chronic kidney disease, or unspecified chronic kidney disease: Secondary | ICD-10-CM | POA: Diagnosis not present

## 2016-12-17 DIAGNOSIS — Y929 Unspecified place or not applicable: Secondary | ICD-10-CM | POA: Insufficient documentation

## 2016-12-17 DIAGNOSIS — I509 Heart failure, unspecified: Secondary | ICD-10-CM | POA: Insufficient documentation

## 2016-12-17 DIAGNOSIS — Z79899 Other long term (current) drug therapy: Secondary | ICD-10-CM | POA: Insufficient documentation

## 2016-12-17 NOTE — ED Triage Notes (Signed)
PT states she was cooking last week and the top come off of a pot and she caught it and burned her left hand index finger but no impaired skin integrity about a week ago. PT states she wants to make sure the skin is not infected bc her burn is on the same side as her dialysis fistula.

## 2016-12-17 NOTE — Discharge Instructions (Signed)
Your burn should heal well over the next several days. Take Tylenol for pain.

## 2016-12-17 NOTE — ED Provider Notes (Signed)
[facility]-EM[facility]Y DEPT Provider Note   CSN: [id number] Arrival date & time: 12/17/16  1007     History   Chief Complaint Chief Complaint  Patient presents with  . Burn    HPI [name] is a 83 y.o. female.  Pt w/ PMH of t2DM, HTN, HFpEF, CKD4, Hypothyroid, Osteoporosis, RA who present with burn to the left tip of the index finger that she sustained while cooking and had exposure to steam. She reports that the pain has progressed some over the last several days and she now has a small blister over the medial aspect of the left index finger. She denies any fevers, chills, significant swelling or erythema of the finger or hand. She does report some concern for infection given that she had fistula placed several months ago and is on dialysis. Patient denies any pain with range of motion. She has moderate pain to palpation over the area of burn locally. There is no drainage.      Past Medical History:  Diagnosis Date  . Anemia associated with chronic renal failure    ESA therapy  . CHF (congestive heart failure) (HCC)     preserved left ventricular systolic function  . CKD (chronic kidney disease) stage 4, GFR 15-29 ml/min (HCC)    [name] 02/16/2013  . Diabetic foot ulcers (HCC)    "get them on borh feet" (02/16/2013)  . Headache   . Heart murmur   . History of blood transfusion 1975  . Hypertension   . Hypothyroid   . Osteoporosis   . Overweight(278.02)   . Pneumonia 02/2011   [name] 02/20/2011 (02/16/2013)  . Rheumatoid arthritis (HCC)   . Shortness of breath dyspnea    with exertion  . Type II diabetes mellitus (HCC)    patient denies  . Vaginal pessary present ~ 2012    Patient Active Problem List   Diagnosis Date Noted  . Uterovaginal prolapse, complete 03/26/2013  . Diabetic foot ulcers (HCC) 02/16/2013  . Hyperkalemia 06/24/2012  . Hyperlipidemia 05/20/2012  . Uterine prolapse 05/19/2012  . Chronic kidney disease, stage 4, severely decreased GFR (HCC)   .  Hypertension   . Anemia associated with chronic renal failure   . Hypothyroid   . CAROTID BRUIT, LEFT 05/11/2009  . DIABETES MELLITUS 03/11/2009  . OVERWEIGHT/OBESITY 03/11/2009    Past Surgical History:  Procedure Laterality Date  . AV FISTULA PLACEMENT Left 01/26/2015   Procedure: LEFT ARM ARTERIOVENOUS (AV) FISTULA CREATION;  Surgeon: [name], MD;  Location: [facility] OR;  Service: Vascular;  Laterality: Left;  . CATARACT EXTRACTION W/ INTRAOCULAR LENS  IMPLANT, BILATERAL Bilateral 1990's  . COLONOSCOPY    . LIGATION OF COMPETING BRANCHES OF ARTERIOVENOUS FISTULA Left 06/01/2016   Procedure: LIGATION OF COMPETING BRANCHES OF BRACHIOCEPHALIC ARTERIOVENOUS FISTULA LEFT ARM;  Surgeon: Chuck Hint, MD;  Location: [facility] OR;  Service: Vascular;  Laterality: Left;  . ORIF FEMUR FRACTURE Left 1975   "got a rod and screw in it" (02/16/2013)  . PERIPHERAL VASCULAR CATHETERIZATION Left 05/21/2016   Procedure: A/V Lucretia Kern;  Surgeon: Chuck Hint, MD;  Location: [facility] INVASIVE CV LAB;  Service: Cardiovascular;  Laterality: Left;  upper arm    OB History    No data available       Home Medications    Prior to Admission medications   Medication Sig Start Date End Date Taking? Authorizing Provider  ALPRAZolam Prudy Feeler) 0.5 MG tablet Take 0.5 mg by mouth 3 (three) times daily as  needed for anxiety.     Historical Provider, MD  Cholecalciferol (VITAMIN D3) 2000 units TABS Take 2,000 Units by mouth daily.    Historical Provider, MD  fish oil-omega-3 fatty acids 1000 MG capsule Take 2 g by mouth daily.     Historical Provider, MD  levothyroxine (SYNTHROID, LEVOTHROID) 125 MCG tablet Take 125 mcg by mouth daily before breakfast.  12/16/14   Historical Provider, MD  lidocaine-prilocaine (EMLA) cream Apply 1 application topically as needed.    Historical Provider, MD  SENSIPAR 30 MG tablet  07/26/16   Historical Provider, MD  sevelamer carbonate (RENVELA) 800 MG tablet Take 800 mg by mouth 2  (two) times daily.     Historical Provider, MD    Family History Family History  Problem Relation Age of Onset  . Heart disease Mother     before age 63  . Diabetes Father   . Stroke Other   . Heart disease Other   . Kidney disease Other   . Cancer Daughter     cervical  . Heart disease Daughter     before age 44  . Cancer Grandchild     lymph nodes  . Heart disease Sister     before age 11  . Heart disease Brother     before age 79    Social History Social History  Substance Use Topics  . Smoking status: Never Smoker  . Smokeless tobacco: Never Used  . Alcohol use No     Allergies   Iodinated diagnostic agents; Rena-vite [nephro-vite]; and Lipitor [atorvastatin]   Review of Systems Review of Systems  Constitutional: Negative for chills and fever.  HENT: Negative for ear pain and sore throat.   Eyes: Negative for pain and visual disturbance.  Respiratory: Negative for cough and shortness of breath.   Cardiovascular: Negative for chest pain and palpitations.  Gastrointestinal: Negative for abdominal pain and vomiting.  Genitourinary: Negative for dysuria and hematuria.  Musculoskeletal: Negative for arthralgias and back pain.  Skin: Negative for color change and rash.  Neurological: Negative for seizures and syncope.  All other systems reviewed and are negative.    Physical Exam Updated Vital Signs BP (!) 146/63 (BP Location: Right Arm)   Pulse 72   Temp 97.5 F (36.4 C) (Oral)   Resp 17   Ht 5\' 4"  (1.626 m)   Wt 95.7 kg   SpO2 98%   BMI 36.22 kg/m   Physical Exam  Constitutional: She is oriented to person, place, and time. She appears well-developed and well-nourished. No distress.  HENT:  Head: Normocephalic and atraumatic.  Eyes: Conjunctivae are normal.  Cardiovascular: Normal rate and regular rhythm.   Pulmonary/Chest: Effort normal.  Musculoskeletal: She exhibits no edema or deformity.  Small blister over medial aspect of distal left  index finger, w/ mild TTP. No ROM defect, no erythema or swelling extending past local burn.  Neurological: She is alert and oriented to person, place, and time.  Skin: Skin is warm and dry.  Psychiatric: She has a normal mood and affect. Her behavior is normal.     ED Treatments / Results  Labs (all labs ordered are listed, but only abnormal results are displayed) Labs Reviewed - No data to display  EKG  EKG Interpretation None       Radiology No results found.  Procedures Procedures (including critical care time)  Medications Ordered in ED Medications - No data to display   Initial Impression / Assessment and Plan /  ED Course  I have reviewed the triage vital signs and the nursing notes.  Pertinent labs & imaging results that were available during my care of the patient were reviewed by me and considered in my medical decision making (see chart for details).     Pt presents with small burn to left index finger sustained after exposure to steam. Pain had progressed over last several days with small blister forming locally. No signs of infection or sub-dermal tissue involvement. Pt was instructed to use Tylenol for pain control and discharged home with reassurance and return precautions.  Final Clinical Impressions(s) / ED Diagnoses   Final diagnoses:  Burn    New Prescriptions New Prescriptions   No medications on file     Carolynn Comment, MD 12/17/16 1117    Blane Ohara, MD 12/18/16 [phone]

## 2016-12-18 DIAGNOSIS — D631 Anemia in chronic kidney disease: Secondary | ICD-10-CM | POA: Diagnosis not present

## 2016-12-18 DIAGNOSIS — D689 Coagulation defect, unspecified: Secondary | ICD-10-CM | POA: Diagnosis not present

## 2016-12-18 DIAGNOSIS — N186 End stage renal disease: Secondary | ICD-10-CM | POA: Diagnosis not present

## 2016-12-18 DIAGNOSIS — E8779 Other fluid overload: Secondary | ICD-10-CM | POA: Diagnosis not present

## 2016-12-18 DIAGNOSIS — Z7689 Persons encountering health services in other specified circumstances: Secondary | ICD-10-CM | POA: Diagnosis not present

## 2016-12-18 DIAGNOSIS — N2581 Secondary hyperparathyroidism of renal origin: Secondary | ICD-10-CM | POA: Diagnosis not present

## 2016-12-18 DIAGNOSIS — E1129 Type 2 diabetes mellitus with other diabetic kidney complication: Secondary | ICD-10-CM | POA: Diagnosis not present

## 2016-12-20 DIAGNOSIS — E8779 Other fluid overload: Secondary | ICD-10-CM | POA: Diagnosis not present

## 2016-12-20 DIAGNOSIS — N2581 Secondary hyperparathyroidism of renal origin: Secondary | ICD-10-CM | POA: Diagnosis not present

## 2016-12-20 DIAGNOSIS — E1129 Type 2 diabetes mellitus with other diabetic kidney complication: Secondary | ICD-10-CM | POA: Diagnosis not present

## 2016-12-20 DIAGNOSIS — D689 Coagulation defect, unspecified: Secondary | ICD-10-CM | POA: Diagnosis not present

## 2016-12-20 DIAGNOSIS — N186 End stage renal disease: Secondary | ICD-10-CM | POA: Diagnosis not present

## 2016-12-20 DIAGNOSIS — Z7689 Persons encountering health services in other specified circumstances: Secondary | ICD-10-CM | POA: Diagnosis not present

## 2016-12-20 DIAGNOSIS — D631 Anemia in chronic kidney disease: Secondary | ICD-10-CM | POA: Diagnosis not present

## 2016-12-22 DIAGNOSIS — E8779 Other fluid overload: Secondary | ICD-10-CM | POA: Diagnosis not present

## 2016-12-22 DIAGNOSIS — N186 End stage renal disease: Secondary | ICD-10-CM | POA: Diagnosis not present

## 2016-12-22 DIAGNOSIS — Z7689 Persons encountering health services in other specified circumstances: Secondary | ICD-10-CM | POA: Diagnosis not present

## 2016-12-22 DIAGNOSIS — D631 Anemia in chronic kidney disease: Secondary | ICD-10-CM | POA: Diagnosis not present

## 2016-12-22 DIAGNOSIS — N2581 Secondary hyperparathyroidism of renal origin: Secondary | ICD-10-CM | POA: Diagnosis not present

## 2016-12-22 DIAGNOSIS — E1129 Type 2 diabetes mellitus with other diabetic kidney complication: Secondary | ICD-10-CM | POA: Diagnosis not present

## 2016-12-22 DIAGNOSIS — D689 Coagulation defect, unspecified: Secondary | ICD-10-CM | POA: Diagnosis not present

## 2016-12-25 DIAGNOSIS — E8779 Other fluid overload: Secondary | ICD-10-CM | POA: Diagnosis not present

## 2016-12-25 DIAGNOSIS — D631 Anemia in chronic kidney disease: Secondary | ICD-10-CM | POA: Diagnosis not present

## 2016-12-25 DIAGNOSIS — N186 End stage renal disease: Secondary | ICD-10-CM | POA: Diagnosis not present

## 2016-12-25 DIAGNOSIS — Z7689 Persons encountering health services in other specified circumstances: Secondary | ICD-10-CM | POA: Diagnosis not present

## 2016-12-25 DIAGNOSIS — D689 Coagulation defect, unspecified: Secondary | ICD-10-CM | POA: Diagnosis not present

## 2016-12-25 DIAGNOSIS — N2581 Secondary hyperparathyroidism of renal origin: Secondary | ICD-10-CM | POA: Diagnosis not present

## 2016-12-25 DIAGNOSIS — E1129 Type 2 diabetes mellitus with other diabetic kidney complication: Secondary | ICD-10-CM | POA: Diagnosis not present

## 2016-12-27 DIAGNOSIS — E8779 Other fluid overload: Secondary | ICD-10-CM | POA: Diagnosis not present

## 2016-12-27 DIAGNOSIS — N186 End stage renal disease: Secondary | ICD-10-CM | POA: Diagnosis not present

## 2016-12-27 DIAGNOSIS — Z7689 Persons encountering health services in other specified circumstances: Secondary | ICD-10-CM | POA: Diagnosis not present

## 2016-12-27 DIAGNOSIS — N2581 Secondary hyperparathyroidism of renal origin: Secondary | ICD-10-CM | POA: Diagnosis not present

## 2016-12-27 DIAGNOSIS — D631 Anemia in chronic kidney disease: Secondary | ICD-10-CM | POA: Diagnosis not present

## 2016-12-27 DIAGNOSIS — E1129 Type 2 diabetes mellitus with other diabetic kidney complication: Secondary | ICD-10-CM | POA: Diagnosis not present

## 2016-12-27 DIAGNOSIS — D689 Coagulation defect, unspecified: Secondary | ICD-10-CM | POA: Diagnosis not present

## 2016-12-29 DIAGNOSIS — E8779 Other fluid overload: Secondary | ICD-10-CM | POA: Diagnosis not present

## 2016-12-29 DIAGNOSIS — D689 Coagulation defect, unspecified: Secondary | ICD-10-CM | POA: Diagnosis not present

## 2016-12-29 DIAGNOSIS — D631 Anemia in chronic kidney disease: Secondary | ICD-10-CM | POA: Diagnosis not present

## 2016-12-29 DIAGNOSIS — E1129 Type 2 diabetes mellitus with other diabetic kidney complication: Secondary | ICD-10-CM | POA: Diagnosis not present

## 2016-12-29 DIAGNOSIS — N2581 Secondary hyperparathyroidism of renal origin: Secondary | ICD-10-CM | POA: Diagnosis not present

## 2016-12-29 DIAGNOSIS — Z7689 Persons encountering health services in other specified circumstances: Secondary | ICD-10-CM | POA: Diagnosis not present

## 2016-12-29 DIAGNOSIS — N186 End stage renal disease: Secondary | ICD-10-CM | POA: Diagnosis not present

## 2016-12-31 DIAGNOSIS — N186 End stage renal disease: Secondary | ICD-10-CM | POA: Diagnosis not present

## 2016-12-31 DIAGNOSIS — E8779 Other fluid overload: Secondary | ICD-10-CM | POA: Diagnosis not present

## 2016-12-31 DIAGNOSIS — D689 Coagulation defect, unspecified: Secondary | ICD-10-CM | POA: Diagnosis not present

## 2016-12-31 DIAGNOSIS — E1129 Type 2 diabetes mellitus with other diabetic kidney complication: Secondary | ICD-10-CM | POA: Diagnosis not present

## 2016-12-31 DIAGNOSIS — N2581 Secondary hyperparathyroidism of renal origin: Secondary | ICD-10-CM | POA: Diagnosis not present

## 2016-12-31 DIAGNOSIS — D631 Anemia in chronic kidney disease: Secondary | ICD-10-CM | POA: Diagnosis not present

## 2017-01-01 DIAGNOSIS — I10 Essential (primary) hypertension: Secondary | ICD-10-CM | POA: Diagnosis not present

## 2017-01-01 DIAGNOSIS — D689 Coagulation defect, unspecified: Secondary | ICD-10-CM | POA: Diagnosis not present

## 2017-01-01 DIAGNOSIS — D631 Anemia in chronic kidney disease: Secondary | ICD-10-CM | POA: Diagnosis not present

## 2017-01-01 DIAGNOSIS — E8779 Other fluid overload: Secondary | ICD-10-CM | POA: Diagnosis not present

## 2017-01-01 DIAGNOSIS — E1129 Type 2 diabetes mellitus with other diabetic kidney complication: Secondary | ICD-10-CM | POA: Diagnosis not present

## 2017-01-01 DIAGNOSIS — N186 End stage renal disease: Secondary | ICD-10-CM | POA: Diagnosis not present

## 2017-01-01 DIAGNOSIS — N2581 Secondary hyperparathyroidism of renal origin: Secondary | ICD-10-CM | POA: Diagnosis not present

## 2017-01-01 DIAGNOSIS — Z7689 Persons encountering health services in other specified circumstances: Secondary | ICD-10-CM | POA: Diagnosis not present

## 2017-01-03 DIAGNOSIS — Z7689 Persons encountering health services in other specified circumstances: Secondary | ICD-10-CM | POA: Diagnosis not present

## 2017-01-03 DIAGNOSIS — D689 Coagulation defect, unspecified: Secondary | ICD-10-CM | POA: Diagnosis not present

## 2017-01-03 DIAGNOSIS — N2581 Secondary hyperparathyroidism of renal origin: Secondary | ICD-10-CM | POA: Diagnosis not present

## 2017-01-03 DIAGNOSIS — D631 Anemia in chronic kidney disease: Secondary | ICD-10-CM | POA: Diagnosis not present

## 2017-01-03 DIAGNOSIS — E1129 Type 2 diabetes mellitus with other diabetic kidney complication: Secondary | ICD-10-CM | POA: Diagnosis not present

## 2017-01-03 DIAGNOSIS — E8779 Other fluid overload: Secondary | ICD-10-CM | POA: Diagnosis not present

## 2017-01-03 DIAGNOSIS — N186 End stage renal disease: Secondary | ICD-10-CM | POA: Diagnosis not present

## 2017-01-04 DIAGNOSIS — Z1389 Encounter for screening for other disorder: Secondary | ICD-10-CM | POA: Diagnosis not present

## 2017-01-04 DIAGNOSIS — E782 Mixed hyperlipidemia: Secondary | ICD-10-CM | POA: Diagnosis not present

## 2017-01-04 DIAGNOSIS — T23029A Burn of unspecified degree of unspecified single finger (nail) except thumb, initial encounter: Secondary | ICD-10-CM | POA: Diagnosis not present

## 2017-01-04 DIAGNOSIS — T23429A Corrosion of unspecified degree of unspecified single finger (nail) except thumb, initial encounter: Secondary | ICD-10-CM | POA: Diagnosis not present

## 2017-01-04 DIAGNOSIS — E119 Type 2 diabetes mellitus without complications: Secondary | ICD-10-CM | POA: Diagnosis not present

## 2017-01-05 DIAGNOSIS — N186 End stage renal disease: Secondary | ICD-10-CM | POA: Diagnosis not present

## 2017-01-05 DIAGNOSIS — D689 Coagulation defect, unspecified: Secondary | ICD-10-CM | POA: Diagnosis not present

## 2017-01-05 DIAGNOSIS — N2581 Secondary hyperparathyroidism of renal origin: Secondary | ICD-10-CM | POA: Diagnosis not present

## 2017-01-05 DIAGNOSIS — E8779 Other fluid overload: Secondary | ICD-10-CM | POA: Diagnosis not present

## 2017-01-05 DIAGNOSIS — Z7689 Persons encountering health services in other specified circumstances: Secondary | ICD-10-CM | POA: Diagnosis not present

## 2017-01-05 DIAGNOSIS — D631 Anemia in chronic kidney disease: Secondary | ICD-10-CM | POA: Diagnosis not present

## 2017-01-05 DIAGNOSIS — E1129 Type 2 diabetes mellitus with other diabetic kidney complication: Secondary | ICD-10-CM | POA: Diagnosis not present

## 2017-01-07 DIAGNOSIS — E1122 Type 2 diabetes mellitus with diabetic chronic kidney disease: Secondary | ICD-10-CM | POA: Diagnosis not present

## 2017-01-07 DIAGNOSIS — Z992 Dependence on renal dialysis: Secondary | ICD-10-CM | POA: Diagnosis not present

## 2017-01-07 DIAGNOSIS — N186 End stage renal disease: Secondary | ICD-10-CM | POA: Diagnosis not present

## 2017-01-08 DIAGNOSIS — D631 Anemia in chronic kidney disease: Secondary | ICD-10-CM | POA: Diagnosis not present

## 2017-01-08 DIAGNOSIS — N2581 Secondary hyperparathyroidism of renal origin: Secondary | ICD-10-CM | POA: Diagnosis not present

## 2017-01-08 DIAGNOSIS — Z7689 Persons encountering health services in other specified circumstances: Secondary | ICD-10-CM | POA: Diagnosis not present

## 2017-01-08 DIAGNOSIS — N186 End stage renal disease: Secondary | ICD-10-CM | POA: Diagnosis not present

## 2017-01-08 DIAGNOSIS — D689 Coagulation defect, unspecified: Secondary | ICD-10-CM | POA: Diagnosis not present

## 2017-01-08 DIAGNOSIS — E1129 Type 2 diabetes mellitus with other diabetic kidney complication: Secondary | ICD-10-CM | POA: Diagnosis not present

## 2017-01-09 ENCOUNTER — Telehealth (HOSPITAL_COMMUNITY): Payer: Self-pay | Admitting: Physical Therapy

## 2017-01-09 ENCOUNTER — Ambulatory Visit (HOSPITAL_COMMUNITY): Payer: Medicare Other | Attending: Internal Medicine | Admitting: Physical Therapy

## 2017-01-09 DIAGNOSIS — M79642 Pain in left hand: Secondary | ICD-10-CM | POA: Insufficient documentation

## 2017-01-09 DIAGNOSIS — E1122 Type 2 diabetes mellitus with diabetic chronic kidney disease: Secondary | ICD-10-CM | POA: Insufficient documentation

## 2017-01-09 DIAGNOSIS — M069 Rheumatoid arthritis, unspecified: Secondary | ICD-10-CM | POA: Diagnosis not present

## 2017-01-09 DIAGNOSIS — I509 Heart failure, unspecified: Secondary | ICD-10-CM | POA: Insufficient documentation

## 2017-01-09 DIAGNOSIS — D631 Anemia in chronic kidney disease: Secondary | ICD-10-CM | POA: Insufficient documentation

## 2017-01-09 DIAGNOSIS — M810 Age-related osteoporosis without current pathological fracture: Secondary | ICD-10-CM | POA: Diagnosis not present

## 2017-01-09 DIAGNOSIS — E039 Hypothyroidism, unspecified: Secondary | ICD-10-CM | POA: Diagnosis not present

## 2017-01-09 DIAGNOSIS — E11621 Type 2 diabetes mellitus with foot ulcer: Secondary | ICD-10-CM | POA: Insufficient documentation

## 2017-01-09 DIAGNOSIS — N184 Chronic kidney disease, stage 4 (severe): Secondary | ICD-10-CM | POA: Insufficient documentation

## 2017-01-09 DIAGNOSIS — T23229S Burn of second degree of unspecified single finger (nail) except thumb, sequela: Secondary | ICD-10-CM | POA: Diagnosis not present

## 2017-01-09 DIAGNOSIS — I130 Hypertensive heart and chronic kidney disease with heart failure and stage 1 through stage 4 chronic kidney disease, or unspecified chronic kidney disease: Secondary | ICD-10-CM | POA: Diagnosis not present

## 2017-01-09 DIAGNOSIS — I770 Arteriovenous fistula, acquired: Secondary | ICD-10-CM | POA: Insufficient documentation

## 2017-01-09 DIAGNOSIS — S91109D Unspecified open wound of unspecified toe(s) without damage to nail, subsequent encounter: Secondary | ICD-10-CM | POA: Diagnosis not present

## 2017-01-09 NOTE — Therapy (Signed)
[facility] [address] [place], [place], [address] Phone: [phone]   Fax:  [phone]  Wound Care Evaluation  Patient Details  Name: [name] MRN: [id number] Date of Birth: 12-20-1931 Referring Provider: Elfredia Nevins  Encounter Date: 01/09/2017      PT End of Session - 01/09/17 1618    Visit Number 1   Number of Visits 8   Date for PT Re-Evaluation 02/08/17   Authorization Type medicare   Authorization - Visit Number 1   Authorization - Number of Visits 8   PT Start Time 1309   PT Stop Time 1340   PT Time Calculation (min) 31 min   Activity Tolerance Patient limited by pain   Behavior During Therapy [facility] for tasks assessed/performed      Past Medical History:  Diagnosis Date  . Anemia associated with chronic renal failure    ESA therapy  . CHF (congestive heart failure) (HCC)     preserved left ventricular systolic function  . CKD (chronic kidney disease) stage 4, GFR 15-29 ml/min (HCC)    [name] 02/16/2013  . Diabetic foot ulcers (HCC)    "get them on borh feet" (02/16/2013)  . Headache   . Heart murmur   . History of blood transfusion 1975  . Hypertension   . Hypothyroid   . Osteoporosis   . Overweight(278.02)   . Pneumonia 02/2011   [name] 02/20/2011 (02/16/2013)  . Rheumatoid arthritis (HCC)   . Shortness of breath dyspnea    with exertion  . Type II diabetes mellitus (HCC)    patient denies  . Vaginal pessary present ~ 2012    Past Surgical History:  Procedure Laterality Date  . AV FISTULA PLACEMENT Left 01/26/2015   Procedure: LEFT ARM ARTERIOVENOUS (AV) FISTULA CREATION;  Surgeon: [name], MD;  Location: [facility] OR;  Service: Vascular;  Laterality: Left;  . CATARACT EXTRACTION W/ INTRAOCULAR LENS  IMPLANT, BILATERAL Bilateral 1990's  . COLONOSCOPY    . LIGATION OF COMPETING BRANCHES OF ARTERIOVENOUS FISTULA Left 06/01/2016   Procedure: LIGATION OF COMPETING BRANCHES OF BRACHIOCEPHALIC ARTERIOVENOUS  FISTULA LEFT ARM;  Surgeon: Chuck Hint, MD;  Location: [facility] OR;  Service: Vascular;  Laterality: Left;  . ORIF FEMUR FRACTURE Left 1975   "got a rod and screw in it" (02/16/2013)  . PERIPHERAL VASCULAR CATHETERIZATION Left 05/21/2016   Procedure: A/V Lucretia Kern;  Surgeon: Chuck Hint, MD;  Location: [facility] INVASIVE CV LAB;  Service: Cardiovascular;  Laterality: Left;  upper arm    There were no vitals filed for this visit.        [facility] PT Assessment - 01/09/17 0001      Assessment   Medical Diagnosis second degree burn Lt index finger   Referring Provider Elfredia Nevins   Onset Date/Surgical Date 12/12/16   Next MD Visit unknown   Prior Therapy not for this episode     Precautions   Precautions None     Restrictions   Weight Bearing Restrictions No     Balance Screen   Has the patient fallen in the past 6 months No   Has the patient had a decrease in activity level because of a fear of falling?  No   Is the patient reluctant to leave their home because of a fear of falling?  No                 Wound Therapy - 01/09/17 1343    Subjective  Ms. Nicholos Johns states that she was taking a kettle off the stove in early April and was burned.  She had continued pain therefore she went to the ER and MD who have referred her to physical therapy.  She was given antibiotics and has been taking them on a regular basis.    Patient and Family Stated Goals burn to heal    Date of Onset 12/12/16   Prior Treatments self care, antibiotics    Pain Assessment No/denies pain  when bandage.  With debridement increases to 8/10 inital 10   Evaluation and Treatment Procedures Explained to Patient/Family Yes   Evaluation and Treatment Procedures agreed to   Wound Properties Date First Assessed: 01/09/17 Time First Assessed: 1314 Wound Type: Burn Location: Finger (Comment which one) , Lt index  Location Orientation: Left Wound Description (Comments): index finger  Present on Admission: Yes    Dressing Type Gauze (Comment)   Dressing Changed Changed   Dressing Status Clean   Dressing Change Frequency PRN   Site / Wound Assessment Clean;Dry   % Wound base Red or Granulating 0%   % Wound base Yellow/Fibrinous Exudate 100%   Peri-wound Assessment Intact   Wound Length (cm) 0.7 cm   Wound Width (cm) 0.3 cm   Wound Depth (cm) --  unknown at this time    Drainage Amount Scant   Drainage Description Sanguineous   Treatment Cleansed;Debridement (Selective)   Selective Debridement - Location wound bed   Selective Debridement - Tools Used Forceps;Scalpel;Scissors   Selective Debridement - Tissue Removed slough   Wound Therapy - Clinical Statement Ms. Bilotti states that she was taking a pot off the stove in early April and burned her hand.  She self treated but the pain became unbearable and she went to the ER.  She was given antibiotics and her MD referred her for skilled physical therapy.  Evaluation demonstates devitalized tissue from her proximal IP jt on her left index finger to the tip of her finger which was easily debrided and healthy tissue noted underneath.  There is a small open area along the medial palmer aspect of her index finger that is .7x.3 with questionable depth.  Ms. Savoia will benefit from skilled physical therapy to promote a healing environment for her burn to totally heal.       Wound Therapy - Functional Problem List decreased hand grip.    Factors Delaying/Impairing Wound Healing Altered sensation;Diabetes Mellitus;Infection - systemic/local   Hydrotherapy Plan Debridement;Dressing change;Patient/family education   Wound Therapy - Frequency Other (comment)   Wound Therapy - Current Recommendations --  2x a week for 4 weeks    Wound Therapy - Follow Up Recommendations Other (comment)   Wound Plan cleanse, debride and dress.  Encourage active ROM    Dressing  medihoney, 2x2, kling and netting.                      PT Education - 01/09/17 1617     Education provided Yes   Education Details keep dressing dry   Person(s) Educated Patient   Methods Explanation   Comprehension Verbalized understanding          PT Short Term Goals - 01/09/17 1620      PT SHORT TERM GOAL #1   Title Pt burn to be 75% granulated to reduce risk of infection    Time 10   Period Days   Status New     PT SHORT TERM GOAL #2  Title Pt to be able to flex finger 70% to be able to grasp objectes    Time 10   Period Days   Status New     PT SHORT TERM GOAL #3   Title Pt pain to be not greater than a 5/10 to allow pt to be able to use her hands for ADL's    Time 10   Period Days   Status New           PT Long Term Goals - 02-05-17 1622      PT LONG TERM GOAL #1   Title Pt burn to be totally healed to decrease risk of infection    Time 4   Period Weeks   Status New     PT LONG TERM GOAL #2   Title Pt to have normal ROM in her left index finger to allow pt to complete all normal activity.   Time 4   Period Weeks   Status New              Plan - 02-05-17 1618    Clinical Impression Statement see above   Rehab Potential Good   PT Frequency 2x / week   PT Duration 4 weeks   PT Treatment/Interventions ADLs/Self Care Home Management;Other (comment)  debridement and dressing change    PT Next Visit Plan continue with debridement; encourage AROM   Consulted and Agree with Plan of Care Patient      Patient will benefit from skilled therapeutic intervention in order to improve the following deficits and impairments:  Pain, Other (comment) (nonhealing burn )  Visit Diagnosis: Partial thickness burn of finger, unspecified laterality, sequela  Pain in left hand      G-Codes - 2017/02/05 1624    Functional Assessment Tool Used (Outpatient Only) clincial judgement:  % of granualation   Functional Limitation Other PT primary   Other PT Primary Current Status (J0932) At least 80 percent but less than 100 percent impaired, limited  or restricted   Other PT Primary Goal Status (I7124) At least 1 percent but less than 20 percent impaired, limited or restricted      Problem List Patient Active Problem List   Diagnosis Date Noted  . Uterovaginal prolapse, complete 03/26/2013  . Diabetic foot ulcers (HCC) 02/16/2013  . Hyperkalemia 06/24/2012  . Hyperlipidemia 05/20/2012  . Uterine prolapse 05/19/2012  . Chronic kidney disease, stage 4, severely decreased GFR (HCC)   . Hypertension   . Anemia associated with chronic renal failure   . Hypothyroid   . CAROTID BRUIT, LEFT 05/11/2009  . DIABETES MELLITUS 03/11/2009  . OVERWEIGHT/OBESITY 03/11/2009    Virgina Organ, PT CLT [phone] 02-05-2017, 4:26 PM  [facility] [address] [place], [place], [address] Phone: [phone]   Fax:  [phone]  Name: JANIJAH SYMONS MRN: [id number] Date of Birth: 02-09-1932

## 2017-01-09 NOTE — Telephone Encounter (Signed)
Print new schedule tell pt [facility] will pick her up Monday and bring her to this apptment per Beth at [facility] . NF 01/09/17

## 2017-01-10 DIAGNOSIS — D631 Anemia in chronic kidney disease: Secondary | ICD-10-CM | POA: Diagnosis not present

## 2017-01-10 DIAGNOSIS — D689 Coagulation defect, unspecified: Secondary | ICD-10-CM | POA: Diagnosis not present

## 2017-01-10 DIAGNOSIS — N2581 Secondary hyperparathyroidism of renal origin: Secondary | ICD-10-CM | POA: Diagnosis not present

## 2017-01-10 DIAGNOSIS — E1129 Type 2 diabetes mellitus with other diabetic kidney complication: Secondary | ICD-10-CM | POA: Diagnosis not present

## 2017-01-10 DIAGNOSIS — N186 End stage renal disease: Secondary | ICD-10-CM | POA: Diagnosis not present

## 2017-01-10 DIAGNOSIS — Z7689 Persons encountering health services in other specified circumstances: Secondary | ICD-10-CM | POA: Diagnosis not present

## 2017-01-11 ENCOUNTER — Ambulatory Visit (HOSPITAL_COMMUNITY): Payer: Medicare Other | Admitting: Physical Therapy

## 2017-01-11 DIAGNOSIS — M79642 Pain in left hand: Secondary | ICD-10-CM

## 2017-01-11 DIAGNOSIS — E11621 Type 2 diabetes mellitus with foot ulcer: Secondary | ICD-10-CM | POA: Diagnosis not present

## 2017-01-11 DIAGNOSIS — D631 Anemia in chronic kidney disease: Secondary | ICD-10-CM | POA: Diagnosis not present

## 2017-01-11 DIAGNOSIS — I770 Arteriovenous fistula, acquired: Secondary | ICD-10-CM | POA: Diagnosis not present

## 2017-01-11 DIAGNOSIS — E039 Hypothyroidism, unspecified: Secondary | ICD-10-CM | POA: Diagnosis not present

## 2017-01-11 DIAGNOSIS — I130 Hypertensive heart and chronic kidney disease with heart failure and stage 1 through stage 4 chronic kidney disease, or unspecified chronic kidney disease: Secondary | ICD-10-CM | POA: Diagnosis not present

## 2017-01-11 DIAGNOSIS — T23229S Burn of second degree of unspecified single finger (nail) except thumb, sequela: Secondary | ICD-10-CM | POA: Diagnosis not present

## 2017-01-11 DIAGNOSIS — I509 Heart failure, unspecified: Secondary | ICD-10-CM | POA: Diagnosis not present

## 2017-01-11 DIAGNOSIS — N184 Chronic kidney disease, stage 4 (severe): Secondary | ICD-10-CM | POA: Diagnosis not present

## 2017-01-11 DIAGNOSIS — E1122 Type 2 diabetes mellitus with diabetic chronic kidney disease: Secondary | ICD-10-CM | POA: Diagnosis not present

## 2017-01-11 DIAGNOSIS — M069 Rheumatoid arthritis, unspecified: Secondary | ICD-10-CM | POA: Diagnosis not present

## 2017-01-11 DIAGNOSIS — M810 Age-related osteoporosis without current pathological fracture: Secondary | ICD-10-CM | POA: Diagnosis not present

## 2017-01-11 DIAGNOSIS — S91109D Unspecified open wound of unspecified toe(s) without damage to nail, subsequent encounter: Secondary | ICD-10-CM | POA: Diagnosis not present

## 2017-01-11 DIAGNOSIS — Z7689 Persons encountering health services in other specified circumstances: Secondary | ICD-10-CM | POA: Diagnosis not present

## 2017-01-11 NOTE — Therapy (Signed)
[facility] [address] [place], [place], [address] Phone: [phone]   Fax:  [phone]  Wound Care Therapy  Patient Details  Name: [name] MRN: [id number] Date of Birth: 1931/10/26 Referring Provider: Elfredia Nevins  Encounter Date: 01/11/2017      PT End of Session - 01/11/17 1231    Visit Number 2   Number of Visits 8   Date for PT Re-Evaluation 02/08/17   Authorization Type medicare   Authorization - Visit Number 2   Authorization - Number of Visits 8   PT Start Time 1030   PT Stop Time 1100   PT Time Calculation (min) 30 min   Activity Tolerance Patient limited by pain   Behavior During Therapy [facility] for tasks assessed/performed      Past Medical History:  Diagnosis Date  . Anemia associated with chronic renal failure    ESA therapy  . CHF (congestive heart failure) (HCC)     preserved left ventricular systolic function  . CKD (chronic kidney disease) stage 4, GFR 15-29 ml/min (HCC)    [name] 02/16/2013  . Diabetic foot ulcers (HCC)    "get them on borh feet" (02/16/2013)  . Headache   . Heart murmur   . History of blood transfusion 1975  . Hypertension   . Hypothyroid   . Osteoporosis   . Overweight(278.02)   . Pneumonia 02/2011   [name] 02/20/2011 (02/16/2013)  . Rheumatoid arthritis (HCC)   . Shortness of breath dyspnea    with exertion  . Type II diabetes mellitus (HCC)    patient denies  . Vaginal pessary present ~ 2012    Past Surgical History:  Procedure Laterality Date  . AV FISTULA PLACEMENT Left 01/26/2015   Procedure: LEFT ARM ARTERIOVENOUS (AV) FISTULA CREATION;  Surgeon: [name], MD;  Location: [facility] OR;  Service: Vascular;  Laterality: Left;  . CATARACT EXTRACTION W/ INTRAOCULAR LENS  IMPLANT, BILATERAL Bilateral 1990's  . COLONOSCOPY    . LIGATION OF COMPETING BRANCHES OF ARTERIOVENOUS FISTULA Left 06/01/2016   Procedure: LIGATION OF COMPETING BRANCHES OF BRACHIOCEPHALIC ARTERIOVENOUS  FISTULA LEFT ARM;  Surgeon: Chuck Hint, MD;  Location: [facility] OR;  Service: Vascular;  Laterality: Left;  . ORIF FEMUR FRACTURE Left 1975   "got a rod and screw in it" (02/16/2013)  . PERIPHERAL VASCULAR CATHETERIZATION Left 05/21/2016   Procedure: A/V Lucretia Kern;  Surgeon: Chuck Hint, MD;  Location: [facility] INVASIVE CV LAB;  Service: Cardiovascular;  Laterality: Left;  upper arm    There were no vitals filed for this visit.                  Wound Therapy - 01/11/17 1227    Subjective Pt states that her finger feels much better than it did    Patient and Family Stated Goals burn to heal    Date of Onset 12/12/16   Prior Treatments self care, antibiotics    Evaluation and Treatment Procedures Explained to Patient/Family Yes   Evaluation and Treatment Procedures agreed to   Wound Properties Date First Assessed: 01/09/17 Time First Assessed: 1314 Wound Type: Burn Location: Finger (Comment which one) , Lt index  Location Orientation: Left Wound Description (Comments): index finger  Present on Admission: Yes   Dressing Type Gauze (Comment)  medihoney   Dressing Changed Changed   Dressing Status Clean   Dressing Change Frequency PRN   Site / Wound Assessment Clean;Dry   % Wound base Red  or Granulating 50%   % Wound base Yellow/Fibrinous Exudate 50%   Peri-wound Assessment Intact   Drainage Amount Scant   Drainage Description Serous   Treatment Cleansed;Debridement (Selective)   Selective Debridement - Location wound bed   Selective Debridement - Tools Used Forceps;Scalpel;Scissors   Selective Debridement - Tissue Removed slough   Wound Therapy - Clinical Statement Pt states that her burn feels much better. Burn has increased in granulated tissue and is not as deep    Wound Therapy - Functional Problem List decreased hand grip.    Factors Delaying/Impairing Wound Healing Altered sensation;Diabetes Mellitus;Infection - systemic/local   Hydrotherapy Plan  Debridement;Dressing change;Patient/family education   Wound Therapy - Frequency Other (comment)   Wound Therapy - Current Recommendations --  2x a week for 4 weeks    Wound Therapy - Follow Up Recommendations Other (comment)   Wound Plan Measure wound weekly.  As soon as depth has decreased to .2 may discharge to self care.  cleanse, debride and dress.  Encourage active ROM    Dressing  medihoney, 2x2, kling and netting.                  PT Education - 01/11/17 1230    Education provided Yes   Education Details ROM for finger    Person(s) Educated Patient   Methods Explanation;Demonstration   Comprehension Verbalized understanding;Returned demonstration          PT Short Term Goals - 01/11/17 1231      PT SHORT TERM GOAL #1   Title Pt burn to be 75% granulated to reduce risk of infection    Time 10   Period Days   Status On-going     PT SHORT TERM GOAL #2   Title Pt to be able to flex finger 70% to be able to grasp objectes    Time 10   Period Days   Status Achieved     PT SHORT TERM GOAL #3   Title Pt pain to be not greater than a 5/10 to allow pt to be able to use her hands for ADL's    Time 10   Period Days   Status Achieved           PT Long Term Goals - 01/11/17 1232      PT LONG TERM GOAL #1   Title Pt burn to be totally healed to decrease risk of infection    Time 4   Period Weeks   Status On-going     PT LONG TERM GOAL #2   Title Pt to have normal ROM in her left index finger to allow pt to complete all normal activity.   Time 4   Period Weeks   Status On-going               Plan - 01/11/17 1231    Clinical Impression Statement see above    Rehab Potential Good   PT Frequency 2x / week   PT Duration 4 weeks   PT Treatment/Interventions ADLs/Self Care Home Management;Other (comment)  debridement and dressing change    PT Next Visit Plan remeasure wound    Consulted and Agree with Plan of Care Patient      Patient will  benefit from skilled therapeutic intervention in order to improve the following deficits and impairments:  Pain, Other (comment) (nonhealing burn )  Visit Diagnosis: Pain in left hand  Partial thickness burn of finger, unspecified laterality, sequela  Problem List Patient Active Problem List   Diagnosis Date Noted  . Uterovaginal prolapse, complete 03/26/2013  . Diabetic foot ulcers (HCC) 02/16/2013  . Hyperkalemia 06/24/2012  . Hyperlipidemia 05/20/2012  . Uterine prolapse 05/19/2012  . Chronic kidney disease, stage 4, severely decreased GFR (HCC)   . Hypertension   . Anemia associated with chronic renal failure   . Hypothyroid   . CAROTID BRUIT, LEFT 05/11/2009  . DIABETES MELLITUS 03/11/2009  . OVERWEIGHT/OBESITY 03/11/2009   Virgina Organ, PT CLT [phone] 01/11/2017, 12:32 PM  [facility] [address] [place], [place], [address] Phone: [phone]   Fax:  [phone]  Name: NADIAH CORBIT MRN: [id number] Date of Birth: October 25, 1931

## 2017-01-12 DIAGNOSIS — D689 Coagulation defect, unspecified: Secondary | ICD-10-CM | POA: Diagnosis not present

## 2017-01-12 DIAGNOSIS — N2581 Secondary hyperparathyroidism of renal origin: Secondary | ICD-10-CM | POA: Diagnosis not present

## 2017-01-12 DIAGNOSIS — Z7689 Persons encountering health services in other specified circumstances: Secondary | ICD-10-CM | POA: Diagnosis not present

## 2017-01-12 DIAGNOSIS — D631 Anemia in chronic kidney disease: Secondary | ICD-10-CM | POA: Diagnosis not present

## 2017-01-12 DIAGNOSIS — N186 End stage renal disease: Secondary | ICD-10-CM | POA: Diagnosis not present

## 2017-01-12 DIAGNOSIS — E1129 Type 2 diabetes mellitus with other diabetic kidney complication: Secondary | ICD-10-CM | POA: Diagnosis not present

## 2017-01-14 ENCOUNTER — Ambulatory Visit (HOSPITAL_COMMUNITY): Payer: Medicare Other | Admitting: Physical Therapy

## 2017-01-14 ENCOUNTER — Encounter: Payer: Self-pay | Admitting: Obstetrics & Gynecology

## 2017-01-14 ENCOUNTER — Ambulatory Visit (INDEPENDENT_AMBULATORY_CARE_PROVIDER_SITE_OTHER): Payer: Medicare Other | Admitting: Obstetrics & Gynecology

## 2017-01-14 ENCOUNTER — Telehealth (HOSPITAL_COMMUNITY): Payer: Self-pay | Admitting: Physical Therapy

## 2017-01-14 VITALS — BP 120/60 | HR 75 | Ht 64.0 in | Wt 225.0 lb

## 2017-01-14 DIAGNOSIS — M810 Age-related osteoporosis without current pathological fracture: Secondary | ICD-10-CM | POA: Diagnosis not present

## 2017-01-14 DIAGNOSIS — I770 Arteriovenous fistula, acquired: Secondary | ICD-10-CM | POA: Diagnosis not present

## 2017-01-14 DIAGNOSIS — N184 Chronic kidney disease, stage 4 (severe): Secondary | ICD-10-CM | POA: Diagnosis not present

## 2017-01-14 DIAGNOSIS — E039 Hypothyroidism, unspecified: Secondary | ICD-10-CM | POA: Diagnosis not present

## 2017-01-14 DIAGNOSIS — I130 Hypertensive heart and chronic kidney disease with heart failure and stage 1 through stage 4 chronic kidney disease, or unspecified chronic kidney disease: Secondary | ICD-10-CM | POA: Diagnosis not present

## 2017-01-14 DIAGNOSIS — S91109D Unspecified open wound of unspecified toe(s) without damage to nail, subsequent encounter: Secondary | ICD-10-CM | POA: Diagnosis not present

## 2017-01-14 DIAGNOSIS — D631 Anemia in chronic kidney disease: Secondary | ICD-10-CM | POA: Diagnosis not present

## 2017-01-14 DIAGNOSIS — I509 Heart failure, unspecified: Secondary | ICD-10-CM | POA: Diagnosis not present

## 2017-01-14 DIAGNOSIS — E1122 Type 2 diabetes mellitus with diabetic chronic kidney disease: Secondary | ICD-10-CM | POA: Diagnosis not present

## 2017-01-14 DIAGNOSIS — Z7689 Persons encountering health services in other specified circumstances: Secondary | ICD-10-CM | POA: Diagnosis not present

## 2017-01-14 DIAGNOSIS — M069 Rheumatoid arthritis, unspecified: Secondary | ICD-10-CM | POA: Diagnosis not present

## 2017-01-14 DIAGNOSIS — T23229S Burn of second degree of unspecified single finger (nail) except thumb, sequela: Secondary | ICD-10-CM | POA: Diagnosis not present

## 2017-01-14 DIAGNOSIS — E11621 Type 2 diabetes mellitus with foot ulcer: Secondary | ICD-10-CM | POA: Diagnosis not present

## 2017-01-14 DIAGNOSIS — M79642 Pain in left hand: Secondary | ICD-10-CM

## 2017-01-14 DIAGNOSIS — N813 Complete uterovaginal prolapse: Secondary | ICD-10-CM | POA: Diagnosis not present

## 2017-01-14 NOTE — Telephone Encounter (Signed)
Called pt.  Son answered the phone and stated that she was not in.  Virgina Organ, PT CLT [phone]

## 2017-01-14 NOTE — Therapy (Signed)
[facility] [address] [place], [place], [address] Phone: [phone]   Fax:  [phone]  Wound Care Therapy  Patient Details  Name: MERRILYN LEGLER MRN: [id number] Date of Birth: 11-Mar-1932 Referring Provider: Elfredia Nevins  Encounter Date: 01/14/2017      PT End of Session - 01/14/17 1322    Visit Number 3   Number of Visits 8   Date for PT Re-Evaluation 02/08/17   Authorization Type medicare   Authorization - Visit Number 3   Authorization - Number of Visits 8   PT Start Time 1320   PT Stop Time 1345   PT Time Calculation (min) 25 min   Activity Tolerance Patient limited by pain   Behavior During Therapy [facility] for tasks assessed/performed      Past Medical History:  Diagnosis Date  . Anemia associated with chronic renal failure    ESA therapy  . CHF (congestive heart failure) (HCC)     preserved left ventricular systolic function  . CKD (chronic kidney disease) stage 4, GFR 15-29 ml/min (HCC)    [name] 02/16/2013  . Diabetic foot ulcers (HCC)    "get them on borh feet" (02/16/2013)  . Headache   . Heart murmur   . History of blood transfusion 1975  . Hypertension   . Hypothyroid   . Osteoporosis   . Overweight(278.02)   . Pneumonia 02/2011   [name] 02/20/2011 (02/16/2013)  . Rheumatoid arthritis (HCC)   . Shortness of breath dyspnea    with exertion  . Type II diabetes mellitus (HCC)    patient denies  . Vaginal pessary present ~ 2012    Past Surgical History:  Procedure Laterality Date  . AV FISTULA PLACEMENT Left 01/26/2015   Procedure: LEFT ARM ARTERIOVENOUS (AV) FISTULA CREATION;  Surgeon: [name], MD;  Location: [facility] OR;  Service: Vascular;  Laterality: Left;  . CATARACT EXTRACTION W/ INTRAOCULAR LENS  IMPLANT, BILATERAL Bilateral 1990's  . COLONOSCOPY    . LIGATION OF COMPETING BRANCHES OF ARTERIOVENOUS FISTULA Left 06/01/2016   Procedure: LIGATION OF COMPETING BRANCHES OF BRACHIOCEPHALIC ARTERIOVENOUS  FISTULA LEFT ARM;  Surgeon: Chuck Hint, MD;  Location: [facility] OR;  Service: Vascular;  Laterality: Left;  . ORIF FEMUR FRACTURE Left 1975   "got a rod and screw in it" (02/16/2013)  . PERIPHERAL VASCULAR CATHETERIZATION Left 05/21/2016   Procedure: A/V Lucretia Kern;  Surgeon: Chuck Hint, MD;  Location: [facility] INVASIVE CV LAB;  Service: Cardiovascular;  Laterality: Left;  upper arm    There were no vitals filed for this visit.                  Wound Therapy - 01/14/17 1320    Subjective Pt states that her finger contines to feel better.     Patient and Family Stated Goals burn to heal    Date of Onset 12/12/16   Prior Treatments self care, antibiotics    Evaluation and Treatment Procedures Explained to Patient/Family Yes   Evaluation and Treatment Procedures agreed to   Wound Properties Date First Assessed: 01/09/17 Time First Assessed: 1314 Wound Type: Burn Location: Finger (Comment which one) , Lt index  Location Orientation: Left Wound Description (Comments): index finger  Present on Admission: Yes   Dressing Type Gauze (Comment)  medihoney   Dressing Status Clean   Dressing Change Frequency PRN   Site / Wound Assessment Clean;Dry   % Wound base Red or Granulating 75% ;100% after debridement   %  Wound base Yellow/Fibrinous Exudate 25%   Peri-wound Assessment Intact   Undermining (cm) 1.0 at least laterally from 6to 12    Drainage Amount Scant   Drainage Description Serous   Treatment Cleansed;Debridement (Selective)   Selective Debridement - Location wound bed   Selective Debridement - Tools Used Forceps;Scalpel;Scissors   Selective Debridement - Tissue Removed slough   Wound Therapy - Clinical Statement Pt has worked on her ROM., wound is clean and burn is healing as expected.     Wound Therapy - Functional Problem List decreased hand grip.    Factors Delaying/Impairing Wound Healing Altered sensation;Diabetes Mellitus;Infection - systemic/local    Hydrotherapy Plan Debridement;Dressing change;Patient/family education   Wound Therapy - Frequency Other (comment)   Wound Therapy - Current Recommendations --  2x a week for 4 weeks    Wound Therapy - Follow Up Recommendations Other (comment)   Wound Plan Measure burn next treatment    Dressing  medihoney, 2x2, kling and netting.                    PT Short Term Goals - 01/11/17 1231      PT SHORT TERM GOAL #1   Title Pt burn to be 75% granulated to reduce risk of infection    Time 10   Period Days   Status On-going     PT SHORT TERM GOAL #2   Title Pt to be able to flex finger 70% to be able to grasp objectes    Time 10   Period Days   Status Achieved     PT SHORT TERM GOAL #3   Title Pt pain to be not greater than a 5/10 to allow pt to be able to use her hands for ADL's    Time 10   Period Days   Status Achieved           PT Long Term Goals - 01/11/17 1232      PT LONG TERM GOAL #1   Title Pt burn to be totally healed to decrease risk of infection    Time 4   Period Weeks   Status On-going     PT LONG TERM GOAL #2   Title Pt to have normal ROM in her left index finger to allow pt to complete all normal activity.   Time 4   Period Weeks   Status On-going               Plan - 01/14/17 1322    Clinical Impression Statement as above   Rehab Potential Good   PT Frequency 2x / week   PT Duration 4 weeks   PT Treatment/Interventions ADLs/Self Care Home Management;Other (comment)  debridement and dressing change    PT Next Visit Plan remeasure wound next visit pt 20 minutes late today.    Consulted and Agree with Plan of Care Patient      Patient will benefit from skilled therapeutic intervention in order to improve the following deficits and impairments:  Pain, Other (comment) (nonhealing burn )  Visit Diagnosis: Pain in left hand  Partial thickness burn of finger, unspecified laterality, sequela     Problem List Patient Active  Problem List   Diagnosis Date Noted  . Uterovaginal prolapse, complete 03/26/2013  . Diabetic foot ulcers (HCC) 02/16/2013  . Hyperkalemia 06/24/2012  . Hyperlipidemia 05/20/2012  . Uterine prolapse 05/19/2012  . Chronic kidney disease, stage 4, severely decreased GFR (HCC)   . Hypertension   .  Anemia associated with chronic renal failure   . Hypothyroid   . CAROTID BRUIT, LEFT 05/11/2009  . DIABETES MELLITUS 03/11/2009  . OVERWEIGHT/OBESITY 03/11/2009    Virgina Organ, PT CLT [phone] 01/14/2017, 1:43 PM  [facility] [address] [place], [place], [address] Phone: [phone]   Fax:  [phone]  Name: LILLYAUNA JENKINSON MRN: [id number] Date of Birth: 01-27-1932

## 2017-01-14 NOTE — Progress Notes (Signed)
Patient ID: [name], female   DOB: 1932-02-08, 83 y.o.   MRN: [id number] Patient ID: [name], female   DOB: 1932/04/24, 83 y.o.   MRN: [id number] Chief Complaint  Patient presents with  . pessary cleaning    Blood pressure 120/60, pulse 75, height 5\' 4"  (1.626 m), weight 225 lb (102.1 kg).  [name] presents today for routine follow up related to her pessary.   She was fit last week for a Milex ring with support #5 after increased mucosal irritation from a She reports no vaginal discharge or vaginal bleeding.  Exam reveals no undue vaginal mucosal pressure of breakdown, no discharge and no vaginal bleeding.  The pessary is  replaced without difficulty.    [name] will be sen back in 4 months for continued follow up.  [employer], MD   01/14/2017 12:28 PM

## 2017-01-15 ENCOUNTER — Ambulatory Visit: Payer: Medicare Other | Admitting: Obstetrics & Gynecology

## 2017-01-15 DIAGNOSIS — Z7689 Persons encountering health services in other specified circumstances: Secondary | ICD-10-CM | POA: Diagnosis not present

## 2017-01-15 DIAGNOSIS — D631 Anemia in chronic kidney disease: Secondary | ICD-10-CM | POA: Diagnosis not present

## 2017-01-15 DIAGNOSIS — N2581 Secondary hyperparathyroidism of renal origin: Secondary | ICD-10-CM | POA: Diagnosis not present

## 2017-01-15 DIAGNOSIS — E1129 Type 2 diabetes mellitus with other diabetic kidney complication: Secondary | ICD-10-CM | POA: Diagnosis not present

## 2017-01-15 DIAGNOSIS — N186 End stage renal disease: Secondary | ICD-10-CM | POA: Diagnosis not present

## 2017-01-15 DIAGNOSIS — D689 Coagulation defect, unspecified: Secondary | ICD-10-CM | POA: Diagnosis not present

## 2017-01-16 ENCOUNTER — Ambulatory Visit (HOSPITAL_COMMUNITY): Payer: Medicare Other

## 2017-01-16 DIAGNOSIS — M069 Rheumatoid arthritis, unspecified: Secondary | ICD-10-CM | POA: Diagnosis not present

## 2017-01-16 DIAGNOSIS — S91109D Unspecified open wound of unspecified toe(s) without damage to nail, subsequent encounter: Secondary | ICD-10-CM | POA: Diagnosis not present

## 2017-01-16 DIAGNOSIS — I770 Arteriovenous fistula, acquired: Secondary | ICD-10-CM | POA: Diagnosis not present

## 2017-01-16 DIAGNOSIS — N184 Chronic kidney disease, stage 4 (severe): Secondary | ICD-10-CM | POA: Diagnosis not present

## 2017-01-16 DIAGNOSIS — T23229S Burn of second degree of unspecified single finger (nail) except thumb, sequela: Secondary | ICD-10-CM

## 2017-01-16 DIAGNOSIS — E039 Hypothyroidism, unspecified: Secondary | ICD-10-CM | POA: Diagnosis not present

## 2017-01-16 DIAGNOSIS — M79642 Pain in left hand: Secondary | ICD-10-CM

## 2017-01-16 DIAGNOSIS — I509 Heart failure, unspecified: Secondary | ICD-10-CM | POA: Diagnosis not present

## 2017-01-16 DIAGNOSIS — M810 Age-related osteoporosis without current pathological fracture: Secondary | ICD-10-CM | POA: Diagnosis not present

## 2017-01-16 DIAGNOSIS — I130 Hypertensive heart and chronic kidney disease with heart failure and stage 1 through stage 4 chronic kidney disease, or unspecified chronic kidney disease: Secondary | ICD-10-CM | POA: Diagnosis not present

## 2017-01-16 DIAGNOSIS — E11621 Type 2 diabetes mellitus with foot ulcer: Secondary | ICD-10-CM | POA: Diagnosis not present

## 2017-01-16 DIAGNOSIS — D631 Anemia in chronic kidney disease: Secondary | ICD-10-CM | POA: Diagnosis not present

## 2017-01-16 DIAGNOSIS — Z7689 Persons encountering health services in other specified circumstances: Secondary | ICD-10-CM | POA: Diagnosis not present

## 2017-01-16 DIAGNOSIS — E1122 Type 2 diabetes mellitus with diabetic chronic kidney disease: Secondary | ICD-10-CM | POA: Diagnosis not present

## 2017-01-16 NOTE — Therapy (Signed)
[facility] [address] [place], [place], [address] Phone: [phone]   Fax:  [phone]  Wound Care Therapy  Patient Details  Name: [name] MRN: [id number] Date of Birth: 06-04-1932 Referring Provider: Elfredia Nevins  Encounter Date: 01/16/2017      PT End of Session - 01/16/17 1421    Visit Number 4   Number of Visits 8   Date for PT Re-Evaluation 02/08/17   Authorization Type medicare   Authorization - Visit Number 4   Authorization - Number of Visits 8   PT Start Time 1349   PT Stop Time 1415   PT Time Calculation (min) 26 min   Activity Tolerance Patient tolerated treatment well   Behavior During Therapy [facility] for tasks assessed/performed      Past Medical History:  Diagnosis Date  . Anemia associated with chronic renal failure    ESA therapy  . CHF (congestive heart failure) (HCC)     preserved left ventricular systolic function  . CKD (chronic kidney disease) stage 4, GFR 15-29 ml/min (HCC)    [name] 02/16/2013  . Diabetic foot ulcers (HCC)    "get them on borh feet" (02/16/2013)  . Headache   . Heart murmur   . History of blood transfusion 1975  . Hypertension   . Hypothyroid   . Osteoporosis   . Overweight(278.02)   . Pneumonia 02/2011   [name] 02/20/2011 (02/16/2013)  . Rheumatoid arthritis (HCC)   . Shortness of breath dyspnea    with exertion  . Type II diabetes mellitus (HCC)    patient denies  . Vaginal pessary present ~ 2012    Past Surgical History:  Procedure Laterality Date  . AV FISTULA PLACEMENT Left 01/26/2015   Procedure: LEFT ARM ARTERIOVENOUS (AV) FISTULA CREATION;  Surgeon: [name], MD;  Location: [facility] OR;  Service: Vascular;  Laterality: Left;  . CATARACT EXTRACTION W/ INTRAOCULAR LENS  IMPLANT, BILATERAL Bilateral 1990's  . COLONOSCOPY    . LIGATION OF COMPETING BRANCHES OF ARTERIOVENOUS FISTULA Left 06/01/2016   Procedure: LIGATION OF COMPETING BRANCHES OF BRACHIOCEPHALIC  ARTERIOVENOUS FISTULA LEFT ARM;  Surgeon: Chuck Hint, MD;  Location: [facility] OR;  Service: Vascular;  Laterality: Left;  . ORIF FEMUR FRACTURE Left 1975   "got a rod and screw in it" (02/16/2013)  . PERIPHERAL VASCULAR CATHETERIZATION Left 05/21/2016   Procedure: A/V Lucretia Kern;  Surgeon: Chuck Hint, MD;  Location: [facility] INVASIVE CV LAB;  Service: Cardiovascular;  Laterality: Left;  upper arm    There were no vitals filed for this visit.                  Wound Therapy - 01/16/17 1423    Subjective Pt. stated her finger pain comes and goes, no report of current pain at entrance.     Patient and Family Stated Goals burn to heal    Date of Onset 12/12/16   Prior Treatments self care, antibiotics    Pain Assessment No/denies pain   Evaluation and Treatment Procedures Explained to Patient/Family Yes   Evaluation and Treatment Procedures agreed to   Wound Properties Date First Assessed: 01/09/17 Time First Assessed: 1314 Wound Type: Burn Location: Finger (Comment which one) , Lt index  Location Orientation: Left Wound Description (Comments): index finger  Present on Admission: Yes   Dressing Type Gauze (Comment)  medihoney, 2x2 and gauze with netting   Dressing Changed Changed   Dressing Status Clean   Dressing  Change Frequency PRN   Site / Wound Assessment Clean;Dry   % Wound base Red or Granulating 75%   % Wound base Yellow/Fibrinous Exudate 25%   Peri-wound Assessment Intact   Wound Length (cm) 0.5 cm  was .7   Wound Width (cm) 0.3 cm  was .3   Undermining (cm) .8 at least laterally from 6 to 12  was 1.0 at least laterally from 6 to 12   Drainage Amount Scant   Drainage Description Serous   Treatment Cleansed;Debridement (Selective)   Selective Debridement - Location wound bed   Selective Debridement - Tools Used Forceps;Scalpel;Scissors   Selective Debridement - Tissue Removed slough   Wound Therapy - Clinical Statement Wound is progressing well, clean  and healing as expected.  Measurements taken with decrease in length.  COntinued with medihoney packed in undermining and gauze.  No reports of pain through session.     Wound Therapy - Functional Problem List decreased hand grip.    Factors Delaying/Impairing Wound Healing Altered sensation;Diabetes Mellitus;Infection - systemic/local   Hydrotherapy Plan Debridement;Dressing change;Patient/family education   Wound Therapy - Frequency --  2x/week for 4 weeks   Wound Plan Measure wound weekly.  As soon as depth has decreased to .2 may discharge to self care.  cleanse, debride and dress.  Encourage active ROM    Dressing  medihoney, 2x2, kling and netting.                    PT Short Term Goals - 01/11/17 1231      PT SHORT TERM GOAL #1   Title Pt burn to be 75% granulated to reduce risk of infection    Time 10   Period Days   Status On-going     PT SHORT TERM GOAL #2   Title Pt to be able to flex finger 70% to be able to grasp objectes    Time 10   Period Days   Status Achieved     PT SHORT TERM GOAL #3   Title Pt pain to be not greater than a 5/10 to allow pt to be able to use her hands for ADL's    Time 10   Period Days   Status Achieved           PT Long Term Goals - 01/11/17 1232      PT LONG TERM GOAL #1   Title Pt burn to be totally healed to decrease risk of infection    Time 4   Period Weeks   Status On-going     PT LONG TERM GOAL #2   Title Pt to have normal ROM in her left index finger to allow pt to complete all normal activity.   Time 4   Period Weeks   Status On-going             Patient will benefit from skilled therapeutic intervention in order to improve the following deficits and impairments:     Visit Diagnosis: Pain in left hand  Partial thickness burn of finger, unspecified laterality, sequela     Problem List Patient Active Problem List   Diagnosis Date Noted  . Uterovaginal prolapse, complete 03/26/2013  .  Diabetic foot ulcers (HCC) 02/16/2013  . Hyperkalemia 06/24/2012  . Hyperlipidemia 05/20/2012  . Uterine prolapse 05/19/2012  . Chronic kidney disease, stage 4, severely decreased GFR (HCC)   . Hypertension   . Anemia associated with chronic renal failure   . Hypothyroid   .  CAROTID BRUIT, LEFT 05/11/2009  . DIABETES MELLITUS 03/11/2009  . OVERWEIGHT/OBESITY 03/11/2009   Becky Sax, LPTA; CBIS [phone]  Virgina Organ, PT CLT [phone] 01/16/2017, 7:00 PM  [facility] [address] [place], [place], [address] Phone: [phone]   Fax:  [phone]  Name: [name] MRN: [id number] Date of Birth: 12-08-1931

## 2017-01-17 DIAGNOSIS — Z7689 Persons encountering health services in other specified circumstances: Secondary | ICD-10-CM | POA: Diagnosis not present

## 2017-01-17 DIAGNOSIS — N186 End stage renal disease: Secondary | ICD-10-CM | POA: Diagnosis not present

## 2017-01-17 DIAGNOSIS — N2581 Secondary hyperparathyroidism of renal origin: Secondary | ICD-10-CM | POA: Diagnosis not present

## 2017-01-17 DIAGNOSIS — E1129 Type 2 diabetes mellitus with other diabetic kidney complication: Secondary | ICD-10-CM | POA: Diagnosis not present

## 2017-01-17 DIAGNOSIS — D689 Coagulation defect, unspecified: Secondary | ICD-10-CM | POA: Diagnosis not present

## 2017-01-17 DIAGNOSIS — D631 Anemia in chronic kidney disease: Secondary | ICD-10-CM | POA: Diagnosis not present

## 2017-01-19 DIAGNOSIS — N186 End stage renal disease: Secondary | ICD-10-CM | POA: Diagnosis not present

## 2017-01-19 DIAGNOSIS — N2581 Secondary hyperparathyroidism of renal origin: Secondary | ICD-10-CM | POA: Diagnosis not present

## 2017-01-19 DIAGNOSIS — D631 Anemia in chronic kidney disease: Secondary | ICD-10-CM | POA: Diagnosis not present

## 2017-01-19 DIAGNOSIS — Z7689 Persons encountering health services in other specified circumstances: Secondary | ICD-10-CM | POA: Diagnosis not present

## 2017-01-19 DIAGNOSIS — D689 Coagulation defect, unspecified: Secondary | ICD-10-CM | POA: Diagnosis not present

## 2017-01-19 DIAGNOSIS — E1129 Type 2 diabetes mellitus with other diabetic kidney complication: Secondary | ICD-10-CM | POA: Diagnosis not present

## 2017-01-21 ENCOUNTER — Ambulatory Visit (HOSPITAL_COMMUNITY): Payer: Medicare Other | Admitting: Physical Therapy

## 2017-01-21 DIAGNOSIS — E11621 Type 2 diabetes mellitus with foot ulcer: Secondary | ICD-10-CM | POA: Diagnosis not present

## 2017-01-21 DIAGNOSIS — M79642 Pain in left hand: Secondary | ICD-10-CM

## 2017-01-21 DIAGNOSIS — E1122 Type 2 diabetes mellitus with diabetic chronic kidney disease: Secondary | ICD-10-CM | POA: Diagnosis not present

## 2017-01-21 DIAGNOSIS — I130 Hypertensive heart and chronic kidney disease with heart failure and stage 1 through stage 4 chronic kidney disease, or unspecified chronic kidney disease: Secondary | ICD-10-CM | POA: Diagnosis not present

## 2017-01-21 DIAGNOSIS — D631 Anemia in chronic kidney disease: Secondary | ICD-10-CM | POA: Diagnosis not present

## 2017-01-21 DIAGNOSIS — N184 Chronic kidney disease, stage 4 (severe): Secondary | ICD-10-CM | POA: Diagnosis not present

## 2017-01-21 DIAGNOSIS — E039 Hypothyroidism, unspecified: Secondary | ICD-10-CM | POA: Diagnosis not present

## 2017-01-21 DIAGNOSIS — M069 Rheumatoid arthritis, unspecified: Secondary | ICD-10-CM | POA: Diagnosis not present

## 2017-01-21 DIAGNOSIS — I770 Arteriovenous fistula, acquired: Secondary | ICD-10-CM | POA: Diagnosis not present

## 2017-01-21 DIAGNOSIS — S91109D Unspecified open wound of unspecified toe(s) without damage to nail, subsequent encounter: Secondary | ICD-10-CM | POA: Diagnosis not present

## 2017-01-21 DIAGNOSIS — T23229S Burn of second degree of unspecified single finger (nail) except thumb, sequela: Secondary | ICD-10-CM | POA: Diagnosis not present

## 2017-01-21 DIAGNOSIS — Z7689 Persons encountering health services in other specified circumstances: Secondary | ICD-10-CM | POA: Diagnosis not present

## 2017-01-21 DIAGNOSIS — M810 Age-related osteoporosis without current pathological fracture: Secondary | ICD-10-CM | POA: Diagnosis not present

## 2017-01-21 DIAGNOSIS — I509 Heart failure, unspecified: Secondary | ICD-10-CM | POA: Diagnosis not present

## 2017-01-21 NOTE — Therapy (Signed)
[facility] [address] [place], [place], [address] Phone: [phone]   Fax:  [phone]  Wound Care Therapy  Patient Details  Name: [name] MRN: [id number] Date of Birth: Jul 14, 1932 Referring Provider: Elfredia Nevins  Encounter Date: 01/21/2017      PT End of Session - 01/21/17 1025    Visit Number 5   Number of Visits 8   Date for PT Re-Evaluation 02/08/17   Authorization Type medicare   Authorization - Visit Number 5   Authorization - Number of Visits 8   PT Start Time 0925   PT Stop Time 0945   PT Time Calculation (min) 20 min   Activity Tolerance Patient tolerated treatment well   Behavior During Therapy [facility] for tasks assessed/performed      Past Medical History:  Diagnosis Date  . Anemia associated with chronic renal failure    ESA therapy  . CHF (congestive heart failure) (HCC)     preserved left ventricular systolic function  . CKD (chronic kidney disease) stage 4, GFR 15-29 ml/min (HCC)    [name] 02/16/2013  . Diabetic foot ulcers (HCC)    "get them on borh feet" (02/16/2013)  . Headache   . Heart murmur   . History of blood transfusion 1975  . Hypertension   . Hypothyroid   . Osteoporosis   . Overweight(278.02)   . Pneumonia 02/2011   [name] 02/20/2011 (02/16/2013)  . Rheumatoid arthritis (HCC)   . Shortness of breath dyspnea    with exertion  . Type II diabetes mellitus (HCC)    patient denies  . Vaginal pessary present ~ 2012    Past Surgical History:  Procedure Laterality Date  . AV FISTULA PLACEMENT Left 01/26/2015   Procedure: LEFT ARM ARTERIOVENOUS (AV) FISTULA CREATION;  Surgeon: [name], MD;  Location: [facility] OR;  Service: Vascular;  Laterality: Left;  . CATARACT EXTRACTION W/ INTRAOCULAR LENS  IMPLANT, BILATERAL Bilateral 1990's  . COLONOSCOPY    . LIGATION OF COMPETING BRANCHES OF ARTERIOVENOUS FISTULA Left 06/01/2016   Procedure: LIGATION OF COMPETING BRANCHES OF BRACHIOCEPHALIC  ARTERIOVENOUS FISTULA LEFT ARM;  Surgeon: Chuck Hint, MD;  Location: [facility] OR;  Service: Vascular;  Laterality: Left;  . ORIF FEMUR FRACTURE Left 1975   "got a rod and screw in it" (02/16/2013)  . PERIPHERAL VASCULAR CATHETERIZATION Left 05/21/2016   Procedure: A/V Lucretia Kern;  Surgeon: Chuck Hint, MD;  Location: [facility] INVASIVE CV LAB;  Service: Cardiovascular;  Laterality: Left;  upper arm    There were no vitals filed for this visit.                  Wound Therapy - 01/21/17 1021    Subjective Pt states her finger is sensitive.     Patient and Family Stated Goals burn to heal    Date of Onset 12/12/16   Prior Treatments self care, antibiotics    Pain Assessment No/denies pain   Evaluation and Treatment Procedures Explained to Patient/Family Yes   Evaluation and Treatment Procedures agreed to   Wound Properties Date First Assessed: 01/09/17 Time First Assessed: 1314 Wound Type: Burn Location: Finger (Comment which one) , Lt index  Location Orientation: Left Wound Description (Comments): index finger  Present on Admission: Yes   Dressing Type Gauze (Comment)  medihoney, 2x2 and gauze with netting   Dressing Changed Changed   Dressing Status Clean   Dressing Change Frequency PRN   Site / Wound Assessment  Clean;Dry   % Wound base Red or Granulating 75%   % Wound base Yellow/Fibrinous Exudate 25%   Peri-wound Assessment Intact   Drainage Amount Minimal   Drainage Description Purulent   Treatment Cleansed;Debridement (Selective)   Selective Debridement - Location wound bed   Selective Debridement - Tools Used Forceps;Scissors   Selective Debridement - Tissue Removed slough   Wound Therapy - Clinical Statement Wound with purulent drainage when expressed.  Cleansed wound well and debrided edges.  Continued with medihoney packing and secured with medipore prior to 2" conform and netting.  Pt may need bulb irrigation or PL if drainage remains purulent.  May also  see about more antibiotics.   Wound Therapy - Functional Problem List decreased hand grip.    Factors Delaying/Impairing Wound Healing Altered sensation;Diabetes Mellitus;Infection - systemic/local   Hydrotherapy Plan Debridement;Dressing change;Patient/family education   Wound Therapy - Frequency --  2x/week for 4 weeks   Wound Plan Check drainage next session.  If purulent and depth remains, may want to complete deeper irrigation and contact MD for more antibiotics.    Dressing  medihoney, 2x2, kling and netting.                    PT Short Term Goals - 01/11/17 1231      PT SHORT TERM GOAL #1   Title Pt burn to be 75% granulated to reduce risk of infection    Time 10   Period Days   Status On-going     PT SHORT TERM GOAL #2   Title Pt to be able to flex finger 70% to be able to grasp objectes    Time 10   Period Days   Status Achieved     PT SHORT TERM GOAL #3   Title Pt pain to be not greater than a 5/10 to allow pt to be able to use her hands for ADL's    Time 10   Period Days   Status Achieved           PT Long Term Goals - 01/11/17 1232      PT LONG TERM GOAL #1   Title Pt burn to be totally healed to decrease risk of infection    Time 4   Period Weeks   Status On-going     PT LONG TERM GOAL #2   Title Pt to have normal ROM in her left index finger to allow pt to complete all normal activity.   Time 4   Period Weeks   Status On-going             Patient will benefit from skilled therapeutic intervention in order to improve the following deficits and impairments:     Visit Diagnosis: Pain in left hand  Partial thickness burn of finger, unspecified laterality, sequela     Problem List Patient Active Problem List   Diagnosis Date Noted  . Uterovaginal prolapse, complete 03/26/2013  . Diabetic foot ulcers (HCC) 02/16/2013  . Hyperkalemia 06/24/2012  . Hyperlipidemia 05/20/2012  . Uterine prolapse 05/19/2012  . Chronic kidney  disease, stage 4, severely decreased GFR (HCC)   . Hypertension   . Anemia associated with chronic renal failure   . Hypothyroid   . CAROTID BRUIT, LEFT 05/11/2009  . DIABETES MELLITUS 03/11/2009  . OVERWEIGHT/OBESITY 03/11/2009    Lurena Nida, PTA/CLT [phone]  01/21/2017, 10:26 AM  [facility] [address] [place], [place], [address] Phone:  [phone]   Fax:  [phone]  Name: LUSINE CORLETT MRN: [id number] Date of Birth: April 28, 1932

## 2017-01-22 DIAGNOSIS — N2581 Secondary hyperparathyroidism of renal origin: Secondary | ICD-10-CM | POA: Diagnosis not present

## 2017-01-22 DIAGNOSIS — E1129 Type 2 diabetes mellitus with other diabetic kidney complication: Secondary | ICD-10-CM | POA: Diagnosis not present

## 2017-01-22 DIAGNOSIS — D689 Coagulation defect, unspecified: Secondary | ICD-10-CM | POA: Diagnosis not present

## 2017-01-22 DIAGNOSIS — Z7689 Persons encountering health services in other specified circumstances: Secondary | ICD-10-CM | POA: Diagnosis not present

## 2017-01-22 DIAGNOSIS — D631 Anemia in chronic kidney disease: Secondary | ICD-10-CM | POA: Diagnosis not present

## 2017-01-22 DIAGNOSIS — N186 End stage renal disease: Secondary | ICD-10-CM | POA: Diagnosis not present

## 2017-01-23 ENCOUNTER — Ambulatory Visit (HOSPITAL_COMMUNITY): Payer: Medicare Other | Admitting: Physical Therapy

## 2017-01-23 DIAGNOSIS — Z7689 Persons encountering health services in other specified circumstances: Secondary | ICD-10-CM | POA: Diagnosis not present

## 2017-01-23 DIAGNOSIS — I770 Arteriovenous fistula, acquired: Secondary | ICD-10-CM | POA: Diagnosis not present

## 2017-01-23 DIAGNOSIS — S91109D Unspecified open wound of unspecified toe(s) without damage to nail, subsequent encounter: Secondary | ICD-10-CM | POA: Diagnosis not present

## 2017-01-23 DIAGNOSIS — E11621 Type 2 diabetes mellitus with foot ulcer: Secondary | ICD-10-CM | POA: Diagnosis not present

## 2017-01-23 DIAGNOSIS — M79642 Pain in left hand: Secondary | ICD-10-CM | POA: Diagnosis not present

## 2017-01-23 DIAGNOSIS — I509 Heart failure, unspecified: Secondary | ICD-10-CM | POA: Diagnosis not present

## 2017-01-23 DIAGNOSIS — N184 Chronic kidney disease, stage 4 (severe): Secondary | ICD-10-CM | POA: Diagnosis not present

## 2017-01-23 DIAGNOSIS — E039 Hypothyroidism, unspecified: Secondary | ICD-10-CM | POA: Diagnosis not present

## 2017-01-23 DIAGNOSIS — T23229S Burn of second degree of unspecified single finger (nail) except thumb, sequela: Secondary | ICD-10-CM | POA: Diagnosis not present

## 2017-01-23 DIAGNOSIS — M069 Rheumatoid arthritis, unspecified: Secondary | ICD-10-CM | POA: Diagnosis not present

## 2017-01-23 DIAGNOSIS — I130 Hypertensive heart and chronic kidney disease with heart failure and stage 1 through stage 4 chronic kidney disease, or unspecified chronic kidney disease: Secondary | ICD-10-CM | POA: Diagnosis not present

## 2017-01-23 DIAGNOSIS — D631 Anemia in chronic kidney disease: Secondary | ICD-10-CM | POA: Diagnosis not present

## 2017-01-23 DIAGNOSIS — E1122 Type 2 diabetes mellitus with diabetic chronic kidney disease: Secondary | ICD-10-CM | POA: Diagnosis not present

## 2017-01-23 DIAGNOSIS — M810 Age-related osteoporosis without current pathological fracture: Secondary | ICD-10-CM | POA: Diagnosis not present

## 2017-01-23 NOTE — Therapy (Signed)
[facility] [address] [place], [place], [address] Phone: [phone]   Fax:  [phone]  Wound Care Therapy  Patient Details  Name: [name] MRN: [id number] Date of Birth: 1931-12-19 Referring Provider: Elfredia Nevins  Encounter Date: 01/23/2017      PT End of Session - 01/23/17 1332    Visit Number 6   Number of Visits 8   Date for PT Re-Evaluation 02/08/17   Authorization Type medicare   Authorization - Visit Number 6   Authorization - Number of Visits 8   PT Start Time 1120   PT Stop Time 1145   PT Time Calculation (min) 25 min   Activity Tolerance Patient tolerated treatment well   Behavior During Therapy [facility] for tasks assessed/performed      Past Medical History:  Diagnosis Date  . Anemia associated with chronic renal failure    ESA therapy  . CHF (congestive heart failure) (HCC)     preserved left ventricular systolic function  . CKD (chronic kidney disease) stage 4, GFR 15-29 ml/min (HCC)    [name] 02/16/2013  . Diabetic foot ulcers (HCC)    "get them on borh feet" (02/16/2013)  . Headache   . Heart murmur   . History of blood transfusion 1975  . Hypertension   . Hypothyroid   . Osteoporosis   . Overweight(278.02)   . Pneumonia 02/2011   [name] 02/20/2011 (02/16/2013)  . Rheumatoid arthritis (HCC)   . Shortness of breath dyspnea    with exertion  . Type II diabetes mellitus (HCC)    patient denies  . Vaginal pessary present ~ 2012    Past Surgical History:  Procedure Laterality Date  . AV FISTULA PLACEMENT Left 01/26/2015   Procedure: LEFT ARM ARTERIOVENOUS (AV) FISTULA CREATION;  Surgeon: [name], MD;  Location: [facility] OR;  Service: Vascular;  Laterality: Left;  . CATARACT EXTRACTION W/ INTRAOCULAR LENS  IMPLANT, BILATERAL Bilateral 1990's  . COLONOSCOPY    . LIGATION OF COMPETING BRANCHES OF ARTERIOVENOUS FISTULA Left 06/01/2016   Procedure: LIGATION OF COMPETING BRANCHES OF BRACHIOCEPHALIC  ARTERIOVENOUS FISTULA LEFT ARM;  Surgeon: Chuck Hint, MD;  Location: [facility] OR;  Service: Vascular;  Laterality: Left;  . ORIF FEMUR FRACTURE Left 1975   "got a rod and screw in it" (02/16/2013)  . PERIPHERAL VASCULAR CATHETERIZATION Left 05/21/2016   Procedure: A/V Lucretia Kern;  Surgeon: Chuck Hint, MD;  Location: [facility] INVASIVE CV LAB;  Service: Cardiovascular;  Laterality: Left;  upper arm    There were no vitals filed for this visit.                  Wound Therapy - 01/23/17 1328    Subjective Pt states her finger is hurting a little today.   Patient and Family Stated Goals burn to heal    Date of Onset 12/12/16   Prior Treatments self care, antibiotics    Pain Assessment 0-10   Pain Score 3    Pain Type Acute pain   Pain Location Finger (Comment which one)   Pain Orientation Left   Pain Descriptors / Indicators Sore   Pain Onset Gradual   Patients Stated Pain Goal 0   Pain Intervention(s) Repositioned   Multiple Pain Sites No   Evaluation and Treatment Procedures Explained to Patient/Family Yes   Evaluation and Treatment Procedures agreed to   Wound Properties Date First Assessed: 01/09/17 Time First Assessed: 1314 Wound Type: Burn Location: Finger (  Comment which one) , Lt index  Location Orientation: Left Wound Description (Comments): index finger  Present on Admission: Yes   Dressing Type Gauze (Comment)  medihoney, 2x2 and gauze with netting   Dressing Changed Changed   Dressing Status Clean   Dressing Change Frequency PRN   Site / Wound Assessment Clean;Dry   % Wound base Red or Granulating 80%   % Wound base Yellow/Fibrinous Exudate 20%   Peri-wound Assessment Intact   Drainage Amount Minimal   Drainage Description Serosanguineous   Treatment Cleansed;Debridement (Selective)   Selective Debridement - Location wound bed   Selective Debridement - Tools Used Forceps;Scissors   Selective Debridement - Tissue Removed slough   Wound Therapy -  Clinical Statement No purulent drainage or odor present this session.  Wound only with serosanginous drainage upon packing removal.  Cleansed finger well and packed loosely wiht medihoney gauze.  Secured with medipore tape and #1 netting.   Wound Therapy - Functional Problem List decreased hand grip.    Factors Delaying/Impairing Wound Healing Altered sensation;Diabetes Mellitus;Infection - systemic/local   Hydrotherapy Plan Debridement;Dressing change;Patient/family education   Wound Therapy - Frequency --  2x/week for 4 weeks   Wound Plan continue to keep check on wound.   If purulent driange or odor returns as well as depth remains, may want to complete deeper irrigation and contact MD for more antibiotics.  Remeasure next session.    Dressing  medihoney, 2x2, kling and netting.                    PT Short Term Goals - 01/11/17 1231      PT SHORT TERM GOAL #1   Title Pt burn to be 75% granulated to reduce risk of infection    Time 10   Period Days   Status On-going     PT SHORT TERM GOAL #2   Title Pt to be able to flex finger 70% to be able to grasp objectes    Time 10   Period Days   Status Achieved     PT SHORT TERM GOAL #3   Title Pt pain to be not greater than a 5/10 to allow pt to be able to use her hands for ADL's    Time 10   Period Days   Status Achieved           PT Long Term Goals - 01/11/17 1232      PT LONG TERM GOAL #1   Title Pt burn to be totally healed to decrease risk of infection    Time 4   Period Weeks   Status On-going     PT LONG TERM GOAL #2   Title Pt to have normal ROM in her left index finger to allow pt to complete all normal activity.   Time 4   Period Weeks   Status On-going             Patient will benefit from skilled therapeutic intervention in order to improve the following deficits and impairments:     Visit Diagnosis: Pain in left hand  Partial thickness burn of finger, unspecified laterality,  sequela     Problem List Patient Active Problem List   Diagnosis Date Noted  . Uterovaginal prolapse, complete 03/26/2013  . Diabetic foot ulcers (HCC) 02/16/2013  . Hyperkalemia 06/24/2012  . Hyperlipidemia 05/20/2012  . Uterine prolapse 05/19/2012  . Chronic kidney disease, stage 4, severely decreased GFR (HCC)   . Hypertension   .  Anemia associated with chronic renal failure   . Hypothyroid   . CAROTID BRUIT, LEFT 05/11/2009  . DIABETES MELLITUS 03/11/2009  . OVERWEIGHT/OBESITY 03/11/2009    Lurena Nida, PTA/CLT [phone]  01/23/2017, 1:34 PM  [facility] [address] [place], [place], [address] Phone: [phone]   Fax:  [phone]  Name: JERALDINE PRIMEAU MRN: [id number] Date of Birth: 31-Dec-1931

## 2017-01-24 DIAGNOSIS — N2581 Secondary hyperparathyroidism of renal origin: Secondary | ICD-10-CM | POA: Diagnosis not present

## 2017-01-24 DIAGNOSIS — E1129 Type 2 diabetes mellitus with other diabetic kidney complication: Secondary | ICD-10-CM | POA: Diagnosis not present

## 2017-01-24 DIAGNOSIS — Z7689 Persons encountering health services in other specified circumstances: Secondary | ICD-10-CM | POA: Diagnosis not present

## 2017-01-24 DIAGNOSIS — N186 End stage renal disease: Secondary | ICD-10-CM | POA: Diagnosis not present

## 2017-01-24 DIAGNOSIS — D631 Anemia in chronic kidney disease: Secondary | ICD-10-CM | POA: Diagnosis not present

## 2017-01-24 DIAGNOSIS — D689 Coagulation defect, unspecified: Secondary | ICD-10-CM | POA: Diagnosis not present

## 2017-01-26 DIAGNOSIS — N186 End stage renal disease: Secondary | ICD-10-CM | POA: Diagnosis not present

## 2017-01-26 DIAGNOSIS — N2581 Secondary hyperparathyroidism of renal origin: Secondary | ICD-10-CM | POA: Diagnosis not present

## 2017-01-26 DIAGNOSIS — D689 Coagulation defect, unspecified: Secondary | ICD-10-CM | POA: Diagnosis not present

## 2017-01-26 DIAGNOSIS — E1129 Type 2 diabetes mellitus with other diabetic kidney complication: Secondary | ICD-10-CM | POA: Diagnosis not present

## 2017-01-26 DIAGNOSIS — Z7689 Persons encountering health services in other specified circumstances: Secondary | ICD-10-CM | POA: Diagnosis not present

## 2017-01-26 DIAGNOSIS — D631 Anemia in chronic kidney disease: Secondary | ICD-10-CM | POA: Diagnosis not present

## 2017-01-28 ENCOUNTER — Ambulatory Visit (HOSPITAL_COMMUNITY): Payer: Medicare Other | Admitting: Physical Therapy

## 2017-01-28 DIAGNOSIS — M79642 Pain in left hand: Secondary | ICD-10-CM | POA: Diagnosis not present

## 2017-01-28 DIAGNOSIS — I509 Heart failure, unspecified: Secondary | ICD-10-CM | POA: Diagnosis not present

## 2017-01-28 DIAGNOSIS — N184 Chronic kidney disease, stage 4 (severe): Secondary | ICD-10-CM | POA: Diagnosis not present

## 2017-01-28 DIAGNOSIS — E1122 Type 2 diabetes mellitus with diabetic chronic kidney disease: Secondary | ICD-10-CM | POA: Diagnosis not present

## 2017-01-28 DIAGNOSIS — E11621 Type 2 diabetes mellitus with foot ulcer: Secondary | ICD-10-CM | POA: Diagnosis not present

## 2017-01-28 DIAGNOSIS — S91109D Unspecified open wound of unspecified toe(s) without damage to nail, subsequent encounter: Secondary | ICD-10-CM | POA: Diagnosis not present

## 2017-01-28 DIAGNOSIS — I130 Hypertensive heart and chronic kidney disease with heart failure and stage 1 through stage 4 chronic kidney disease, or unspecified chronic kidney disease: Secondary | ICD-10-CM | POA: Diagnosis not present

## 2017-01-28 DIAGNOSIS — Z7689 Persons encountering health services in other specified circumstances: Secondary | ICD-10-CM | POA: Diagnosis not present

## 2017-01-28 DIAGNOSIS — M069 Rheumatoid arthritis, unspecified: Secondary | ICD-10-CM | POA: Diagnosis not present

## 2017-01-28 DIAGNOSIS — M810 Age-related osteoporosis without current pathological fracture: Secondary | ICD-10-CM | POA: Diagnosis not present

## 2017-01-28 DIAGNOSIS — I770 Arteriovenous fistula, acquired: Secondary | ICD-10-CM | POA: Diagnosis not present

## 2017-01-28 DIAGNOSIS — E039 Hypothyroidism, unspecified: Secondary | ICD-10-CM | POA: Diagnosis not present

## 2017-01-28 DIAGNOSIS — T23229S Burn of second degree of unspecified single finger (nail) except thumb, sequela: Secondary | ICD-10-CM

## 2017-01-28 DIAGNOSIS — D631 Anemia in chronic kidney disease: Secondary | ICD-10-CM | POA: Diagnosis not present

## 2017-01-28 NOTE — Therapy (Signed)
[facility] [address] [place], [place], [address] Phone: [phone]   Fax:  [phone]  Wound Care Therapy  Patient Details  Name: [name] MRN: [id number] Date of Birth: 19-May-1932 Referring Provider: Elfredia Nevins  Encounter Date: 01/28/2017      PT End of Session - 01/28/17 1502    Visit Number 7   Number of Visits 8   Date for PT Re-Evaluation 02/08/17   Authorization Type medicare   Authorization - Visit Number 7   Authorization - Number of Visits 8   PT Start Time 1125   PT Stop Time 1155   PT Time Calculation (min) 30 min   Activity Tolerance Patient tolerated treatment well   Behavior During Therapy [facility] for tasks assessed/performed      Past Medical History:  Diagnosis Date  . Anemia associated with chronic renal failure    ESA therapy  . CHF (congestive heart failure) (HCC)     preserved left ventricular systolic function  . CKD (chronic kidney disease) stage 4, GFR 15-29 ml/min (HCC)    [name] 02/16/2013  . Diabetic foot ulcers (HCC)    "get them on borh feet" (02/16/2013)  . Headache   . Heart murmur   . History of blood transfusion 1975  . Hypertension   . Hypothyroid   . Osteoporosis   . Overweight(278.02)   . Pneumonia 02/2011   [name] 02/20/2011 (02/16/2013)  . Rheumatoid arthritis (HCC)   . Shortness of breath dyspnea    with exertion  . Type II diabetes mellitus (HCC)    patient denies  . Vaginal pessary present ~ 2012    Past Surgical History:  Procedure Laterality Date  . AV FISTULA PLACEMENT Left 01/26/2015   Procedure: LEFT ARM ARTERIOVENOUS (AV) FISTULA CREATION;  Surgeon: [name], MD;  Location: [facility] OR;  Service: Vascular;  Laterality: Left;  . CATARACT EXTRACTION W/ INTRAOCULAR LENS  IMPLANT, BILATERAL Bilateral 1990's  . COLONOSCOPY    . LIGATION OF COMPETING BRANCHES OF ARTERIOVENOUS FISTULA Left 06/01/2016   Procedure: LIGATION OF COMPETING BRANCHES OF BRACHIOCEPHALIC  ARTERIOVENOUS FISTULA LEFT ARM;  Surgeon: Chuck Hint, MD;  Location: [facility] OR;  Service: Vascular;  Laterality: Left;  . ORIF FEMUR FRACTURE Left 1975   "got a rod and screw in it" (02/16/2013)  . PERIPHERAL VASCULAR CATHETERIZATION Left 05/21/2016   Procedure: A/V Lucretia Kern;  Surgeon: Chuck Hint, MD;  Location: [facility] INVASIVE CV LAB;  Service: Cardiovascular;  Laterality: Left;  upper arm    There were no vitals filed for this visit.                  Wound Therapy - 01/28/17 1500    Subjective Pt states her finger is not as sensitive as it was.    Patient and Family Stated Goals burn to heal    Date of Onset 12/12/16   Prior Treatments self care, antibiotics    Pain Assessment No/denies pain   Evaluation and Treatment Procedures Explained to Patient/Family Yes   Evaluation and Treatment Procedures agreed to   Wound Properties Date First Assessed: 01/09/17 Time First Assessed: 1314 Wound Type: Burn Location: Finger (Comment which one) , Lt index  Location Orientation: Left Wound Description (Comments): index finger  Present on Admission: Yes   Dressing Type Gauze (Comment)  medihoney, 2x2 and gauze with netting   Dressing Changed Changed   Dressing Status Clean   Dressing Change Frequency PRN  Site / Wound Assessment Clean;Dry   % Wound base Red or Granulating 85%   % Wound base Yellow/Fibrinous Exudate 15%   Peri-wound Assessment Intact   Drainage Amount Scant   Drainage Description Serosanguineous   Treatment Cleansed;Debridement (Selective)   Selective Debridement - Location --   Selective Debridement - Tools Used --   Selective Debridement - Tissue Removed --   Wound Therapy - Clinical Statement nothing to debride away this session.  Cleansed wound well and repacked with medihoney gel.  Noticeably improved in size and depth.  Loosely packed to promote closure.    Wound Therapy - Functional Problem List decreased hand grip.    Factors  Delaying/Impairing Wound Healing Altered sensation;Diabetes Mellitus;Infection - systemic/local   Hydrotherapy Plan Debridement;Dressing change;Patient/family education   Wound Therapy - Frequency --  2x/week for 4 weeks   Wound Plan continue to keep check on wound.   If purulent driange or odor returns as well as depth remains, may want to complete deeper irrigation and contact MD for more antibiotics.  Re-evaluate next session.    Dressing  medihoney, 2x2, kling and netting.                    PT Short Term Goals - 01/11/17 1231      PT SHORT TERM GOAL #1   Title Pt burn to be 75% granulated to reduce risk of infection    Time 10   Period Days   Status On-going     PT SHORT TERM GOAL #2   Title Pt to be able to flex finger 70% to be able to grasp objectes    Time 10   Period Days   Status Achieved     PT SHORT TERM GOAL #3   Title Pt pain to be not greater than a 5/10 to allow pt to be able to use her hands for ADL's    Time 10   Period Days   Status Achieved           PT Long Term Goals - 01/11/17 1232      PT LONG TERM GOAL #1   Title Pt burn to be totally healed to decrease risk of infection    Time 4   Period Weeks   Status On-going     PT LONG TERM GOAL #2   Title Pt to have normal ROM in her left index finger to allow pt to complete all normal activity.   Time 4   Period Weeks   Status On-going             Patient will benefit from skilled therapeutic intervention in order to improve the following deficits and impairments:     Visit Diagnosis: Pain in left hand  Partial thickness burn of finger, unspecified laterality, sequela     Problem List Patient Active Problem List   Diagnosis Date Noted  . Uterovaginal prolapse, complete 03/26/2013  . Diabetic foot ulcers (HCC) 02/16/2013  . Hyperkalemia 06/24/2012  . Hyperlipidemia 05/20/2012  . Uterine prolapse 05/19/2012  . Chronic kidney disease, stage 4, severely decreased GFR  (HCC)   . Hypertension   . Anemia associated with chronic renal failure   . Hypothyroid   . CAROTID BRUIT, LEFT 05/11/2009  . DIABETES MELLITUS 03/11/2009  . OVERWEIGHT/OBESITY 03/11/2009    Lurena Nida, PTA/CLT [phone]  01/28/2017, 3:04 PM  [facility] [address] [place], [place], [address] Phone: [phone]  Fax:  [phone]  Name: [name] MRN: [id number] Date of Birth: 04-24-1932

## 2017-01-29 DIAGNOSIS — Z7689 Persons encountering health services in other specified circumstances: Secondary | ICD-10-CM | POA: Diagnosis not present

## 2017-01-29 DIAGNOSIS — N186 End stage renal disease: Secondary | ICD-10-CM | POA: Diagnosis not present

## 2017-01-29 DIAGNOSIS — D689 Coagulation defect, unspecified: Secondary | ICD-10-CM | POA: Diagnosis not present

## 2017-01-29 DIAGNOSIS — N2581 Secondary hyperparathyroidism of renal origin: Secondary | ICD-10-CM | POA: Diagnosis not present

## 2017-01-29 DIAGNOSIS — D631 Anemia in chronic kidney disease: Secondary | ICD-10-CM | POA: Diagnosis not present

## 2017-01-29 DIAGNOSIS — E1129 Type 2 diabetes mellitus with other diabetic kidney complication: Secondary | ICD-10-CM | POA: Diagnosis not present

## 2017-01-30 ENCOUNTER — Ambulatory Visit (HOSPITAL_COMMUNITY): Payer: Medicare Other | Admitting: Physical Therapy

## 2017-01-30 DIAGNOSIS — S91109D Unspecified open wound of unspecified toe(s) without damage to nail, subsequent encounter: Secondary | ICD-10-CM | POA: Diagnosis not present

## 2017-01-30 DIAGNOSIS — E1122 Type 2 diabetes mellitus with diabetic chronic kidney disease: Secondary | ICD-10-CM | POA: Diagnosis not present

## 2017-01-30 DIAGNOSIS — E039 Hypothyroidism, unspecified: Secondary | ICD-10-CM | POA: Diagnosis not present

## 2017-01-30 DIAGNOSIS — Z7689 Persons encountering health services in other specified circumstances: Secondary | ICD-10-CM | POA: Diagnosis not present

## 2017-01-30 DIAGNOSIS — M810 Age-related osteoporosis without current pathological fracture: Secondary | ICD-10-CM | POA: Diagnosis not present

## 2017-01-30 DIAGNOSIS — T23229S Burn of second degree of unspecified single finger (nail) except thumb, sequela: Secondary | ICD-10-CM

## 2017-01-30 DIAGNOSIS — M79642 Pain in left hand: Secondary | ICD-10-CM | POA: Diagnosis not present

## 2017-01-30 DIAGNOSIS — D631 Anemia in chronic kidney disease: Secondary | ICD-10-CM | POA: Diagnosis not present

## 2017-01-30 DIAGNOSIS — E11621 Type 2 diabetes mellitus with foot ulcer: Secondary | ICD-10-CM | POA: Diagnosis not present

## 2017-01-30 DIAGNOSIS — I770 Arteriovenous fistula, acquired: Secondary | ICD-10-CM | POA: Diagnosis not present

## 2017-01-30 DIAGNOSIS — I509 Heart failure, unspecified: Secondary | ICD-10-CM | POA: Diagnosis not present

## 2017-01-30 DIAGNOSIS — N184 Chronic kidney disease, stage 4 (severe): Secondary | ICD-10-CM | POA: Diagnosis not present

## 2017-01-30 DIAGNOSIS — M069 Rheumatoid arthritis, unspecified: Secondary | ICD-10-CM | POA: Diagnosis not present

## 2017-01-30 DIAGNOSIS — I130 Hypertensive heart and chronic kidney disease with heart failure and stage 1 through stage 4 chronic kidney disease, or unspecified chronic kidney disease: Secondary | ICD-10-CM | POA: Diagnosis not present

## 2017-01-30 NOTE — Therapy (Signed)
[facility] [address] [place], [place], [address] Phone: [phone]   Fax:  [phone]  Wound Care Therapy  Patient Details  Name: [name] MRN: [id number] Date of Birth: 1931-11-20 Referring Provider: Elfredia Nevins  Encounter Date: 01/30/2017      PT End of Session - 01/30/17 1202    Visit Number 8   Number of Visits 8   Date for PT Re-Evaluation 03/01/17   Authorization Type medicare   Authorization - Visit Number 8   Authorization - Number of Visits 8   PT Start Time [phone]   PT Stop Time 1015   PT Time Calculation (min) 17 min   Activity Tolerance Patient tolerated treatment well   Behavior During Therapy [facility] for tasks assessed/performed      Past Medical History:  Diagnosis Date  . Anemia associated with chronic renal failure    ESA therapy  . CHF (congestive heart failure) (HCC)     preserved left ventricular systolic function  . CKD (chronic kidney disease) stage 4, GFR 15-29 ml/min (HCC)    [name] 02/16/2013  . Diabetic foot ulcers (HCC)    "get them on borh feet" (02/16/2013)  . Headache   . Heart murmur   . History of blood transfusion 1975  . Hypertension   . Hypothyroid   . Osteoporosis   . Overweight(278.02)   . Pneumonia 02/2011   [name] 02/20/2011 (02/16/2013)  . Rheumatoid arthritis (HCC)   . Shortness of breath dyspnea    with exertion  . Type II diabetes mellitus (HCC)    patient denies  . Vaginal pessary present ~ 2012    Past Surgical History:  Procedure Laterality Date  . AV FISTULA PLACEMENT Left 01/26/2015   Procedure: LEFT ARM ARTERIOVENOUS (AV) FISTULA CREATION;  Surgeon: [name], MD;  Location: [facility] OR;  Service: Vascular;  Laterality: Left;  . CATARACT EXTRACTION W/ INTRAOCULAR LENS  IMPLANT, BILATERAL Bilateral 1990's  . COLONOSCOPY    . LIGATION OF COMPETING BRANCHES OF ARTERIOVENOUS FISTULA Left 06/01/2016   Procedure: LIGATION OF COMPETING BRANCHES OF BRACHIOCEPHALIC  ARTERIOVENOUS FISTULA LEFT ARM;  Surgeon: Chuck Hint, MD;  Location: [facility] OR;  Service: Vascular;  Laterality: Left;  . ORIF FEMUR FRACTURE Left 1975   "got a rod and screw in it" (02/16/2013)  . PERIPHERAL VASCULAR CATHETERIZATION Left 05/21/2016   Procedure: A/V Lucretia Kern;  Surgeon: Chuck Hint, MD;  Location: [facility] INVASIVE CV LAB;  Service: Cardiovascular;  Laterality: Left;  upper arm    There were no vitals filed for this visit.                  Wound Therapy - 01/30/17 1157    Subjective Pt states her finger was burning last night and hurts now too rating at a 5/10   Patient and Family Stated Goals burn to heal    Date of Onset 12/12/16   Prior Treatments self care, antibiotics    Pain Assessment 0-10   Pain Score 5    Pain Type Acute pain   Pain Location Finger (Comment which one)   Pain Orientation Left  left index finger   Pain Descriptors / Indicators Burning   Pain Onset Awakened from sleep   Patients Stated Pain Goal 0   Pain Intervention(s) Repositioned   Multiple Pain Sites No   Evaluation and Treatment Procedures Explained to Patient/Family Yes   Evaluation and Treatment Procedures agreed to   Wound Properties  Date First Assessed: 01/09/17 Time First Assessed: 1314 Wound Type: Burn Location: Finger (Comment which one) , Lt index  Location Orientation: Left Wound Description (Comments): index finger  Present on Admission: Yes   Dressing Type Gauze (Comment)  medihoney, 2x2 and gauze with netting   Dressing Changed Changed   Dressing Status Clean   Dressing Change Frequency PRN   Site / Wound Assessment Clean;Dry   % Wound base Red or Granulating 95%   % Wound base Yellow/Fibrinous Exudate 5%   Peri-wound Assessment Intact   Wound Length (cm) 0.3 cm  was 0.5 cm   Wound Width (cm) 0.2 cm  was 0.3 cm   Wound Depth (cm) 0.2 cm  was 0.7 cm:  Wound undermines 3/4 to other side of finger.    Drainage Amount Scant   Drainage Description  Serosanguineous   Treatment Cleansed   Wound Therapy - Clinical Statement nothing to debride away this session.  Remeasured this session with notible reduction in opening size and depth of wound.  Cleansed wound well and repacked with medihoney gel.    Wound Therapy - Functional Problem List decreased hand grip.    Factors Delaying/Impairing Wound Healing Altered sensation;Diabetes Mellitus;Infection - systemic/local   Hydrotherapy Plan Debridement;Dressing change;Patient/family education   Wound Therapy - Frequency --  2x/week for an additional  4 weeks   Wound Plan continue to keep check on wound due to undermining.  Wound is in a difficult area for pt to care for by herself.  Due to this, pt being diabetic, pt age  and the undermining pt will continue to benefit from skilled care.    If purulent driange or odor returns as well as depth remains, may want to complete deeper irrigation and contact MD for more antibiotics..   Dressing  medihoney, 2x2, kling and netting.                    PT Short Term Goals - 01/11/17 1231      PT SHORT TERM GOAL #1   Title Pt burn to be 75% granulated to reduce risk of infection    Time 10   Period Days   Status On-going     PT SHORT TERM GOAL #2   Title Pt to be able to flex finger 70% to be able to grasp objectes    Time 10   Period Days   Status Achieved     PT SHORT TERM GOAL #3   Title Pt pain to be not greater than a 5/10 to allow pt to be able to use her hands for ADL's    Time 10   Period Days   Status Achieved           PT Long Term Goals - 01/11/17 1232      PT LONG TERM GOAL #1   Title Pt burn to be totally healed to decrease risk of infection    Time 4   Period Weeks   Status On-going     PT LONG TERM GOAL #2   Title Pt to have normal ROM in her left index finger to allow pt to complete all normal activity.   Time 4   Period Weeks   Status On-going               Plan - 01/30/17 1203    Rehab  Potential Good   PT Frequency 2x / week   PT Duration 4 weeks   PT Treatment/Interventions  ADLs/Self Care Home Management;Other (comment)  debridement and dressing change    PT Next Visit Plan remeasure wound next visit pt 20 minutes late today.    Consulted and Agree with Plan of Care Patient      Patient will benefit from skilled therapeutic intervention in order to improve the following deficits and impairments:  Pain, Other (comment) (nonhealing burn )  Visit Diagnosis: Pain in left hand  Partial thickness burn of finger, unspecified laterality, sequela     Problem List Patient Active Problem List   Diagnosis Date Noted  . Uterovaginal prolapse, complete 03/26/2013  . Diabetic foot ulcers (HCC) 02/16/2013  . Hyperkalemia 06/24/2012  . Hyperlipidemia 05/20/2012  . Uterine prolapse 05/19/2012  . Chronic kidney disease, stage 4, severely decreased GFR (HCC)   . Hypertension   . Anemia associated with chronic renal failure   . Hypothyroid   . CAROTID BRUIT, LEFT 05/11/2009  . DIABETES MELLITUS 03/11/2009  . OVERWEIGHT/OBESITY 03/11/2009    Lurena Nida, PTA/CLT [phone]  01/30/2017, 12:04 PM  [facility] [address] [place], [place], [address] Phone: [phone]   Fax:  [phone]  Name: TAMISHA NORDSTROM MRN: [id number] Date of Birth: 25-Aug-1932

## 2017-01-31 DIAGNOSIS — D689 Coagulation defect, unspecified: Secondary | ICD-10-CM | POA: Diagnosis not present

## 2017-01-31 DIAGNOSIS — E1129 Type 2 diabetes mellitus with other diabetic kidney complication: Secondary | ICD-10-CM | POA: Diagnosis not present

## 2017-01-31 DIAGNOSIS — D631 Anemia in chronic kidney disease: Secondary | ICD-10-CM | POA: Diagnosis not present

## 2017-01-31 DIAGNOSIS — N186 End stage renal disease: Secondary | ICD-10-CM | POA: Diagnosis not present

## 2017-01-31 DIAGNOSIS — N2581 Secondary hyperparathyroidism of renal origin: Secondary | ICD-10-CM | POA: Diagnosis not present

## 2017-01-31 DIAGNOSIS — Z7689 Persons encountering health services in other specified circumstances: Secondary | ICD-10-CM | POA: Diagnosis not present

## 2017-01-31 DIAGNOSIS — I10 Essential (primary) hypertension: Secondary | ICD-10-CM | POA: Diagnosis not present

## 2017-02-02 DIAGNOSIS — N186 End stage renal disease: Secondary | ICD-10-CM | POA: Diagnosis not present

## 2017-02-02 DIAGNOSIS — D631 Anemia in chronic kidney disease: Secondary | ICD-10-CM | POA: Diagnosis not present

## 2017-02-02 DIAGNOSIS — N2581 Secondary hyperparathyroidism of renal origin: Secondary | ICD-10-CM | POA: Diagnosis not present

## 2017-02-02 DIAGNOSIS — D689 Coagulation defect, unspecified: Secondary | ICD-10-CM | POA: Diagnosis not present

## 2017-02-02 DIAGNOSIS — E1129 Type 2 diabetes mellitus with other diabetic kidney complication: Secondary | ICD-10-CM | POA: Diagnosis not present

## 2017-02-02 DIAGNOSIS — Z7689 Persons encountering health services in other specified circumstances: Secondary | ICD-10-CM | POA: Diagnosis not present

## 2017-02-04 DIAGNOSIS — N2581 Secondary hyperparathyroidism of renal origin: Secondary | ICD-10-CM | POA: Diagnosis not present

## 2017-02-04 DIAGNOSIS — D631 Anemia in chronic kidney disease: Secondary | ICD-10-CM | POA: Diagnosis not present

## 2017-02-04 DIAGNOSIS — D689 Coagulation defect, unspecified: Secondary | ICD-10-CM | POA: Diagnosis not present

## 2017-02-04 DIAGNOSIS — E1129 Type 2 diabetes mellitus with other diabetic kidney complication: Secondary | ICD-10-CM | POA: Diagnosis not present

## 2017-02-04 DIAGNOSIS — N186 End stage renal disease: Secondary | ICD-10-CM | POA: Diagnosis not present

## 2017-02-05 ENCOUNTER — Ambulatory Visit (HOSPITAL_COMMUNITY): Payer: Medicare Other | Admitting: Physical Therapy

## 2017-02-05 DIAGNOSIS — N186 End stage renal disease: Secondary | ICD-10-CM | POA: Diagnosis not present

## 2017-02-05 DIAGNOSIS — I770 Arteriovenous fistula, acquired: Secondary | ICD-10-CM | POA: Diagnosis not present

## 2017-02-05 DIAGNOSIS — S91109D Unspecified open wound of unspecified toe(s) without damage to nail, subsequent encounter: Secondary | ICD-10-CM | POA: Diagnosis not present

## 2017-02-05 DIAGNOSIS — D631 Anemia in chronic kidney disease: Secondary | ICD-10-CM | POA: Diagnosis not present

## 2017-02-05 DIAGNOSIS — E039 Hypothyroidism, unspecified: Secondary | ICD-10-CM | POA: Diagnosis not present

## 2017-02-05 DIAGNOSIS — N184 Chronic kidney disease, stage 4 (severe): Secondary | ICD-10-CM | POA: Diagnosis not present

## 2017-02-05 DIAGNOSIS — I130 Hypertensive heart and chronic kidney disease with heart failure and stage 1 through stage 4 chronic kidney disease, or unspecified chronic kidney disease: Secondary | ICD-10-CM | POA: Diagnosis not present

## 2017-02-05 DIAGNOSIS — M810 Age-related osteoporosis without current pathological fracture: Secondary | ICD-10-CM | POA: Diagnosis not present

## 2017-02-05 DIAGNOSIS — M069 Rheumatoid arthritis, unspecified: Secondary | ICD-10-CM | POA: Diagnosis not present

## 2017-02-05 DIAGNOSIS — M79642 Pain in left hand: Secondary | ICD-10-CM

## 2017-02-05 DIAGNOSIS — T23229S Burn of second degree of unspecified single finger (nail) except thumb, sequela: Secondary | ICD-10-CM | POA: Diagnosis not present

## 2017-02-05 DIAGNOSIS — E11621 Type 2 diabetes mellitus with foot ulcer: Secondary | ICD-10-CM | POA: Diagnosis not present

## 2017-02-05 DIAGNOSIS — E1122 Type 2 diabetes mellitus with diabetic chronic kidney disease: Secondary | ICD-10-CM | POA: Diagnosis not present

## 2017-02-05 DIAGNOSIS — N2581 Secondary hyperparathyroidism of renal origin: Secondary | ICD-10-CM | POA: Diagnosis not present

## 2017-02-05 DIAGNOSIS — Z7689 Persons encountering health services in other specified circumstances: Secondary | ICD-10-CM | POA: Diagnosis not present

## 2017-02-05 DIAGNOSIS — D689 Coagulation defect, unspecified: Secondary | ICD-10-CM | POA: Diagnosis not present

## 2017-02-05 DIAGNOSIS — E1129 Type 2 diabetes mellitus with other diabetic kidney complication: Secondary | ICD-10-CM | POA: Diagnosis not present

## 2017-02-05 DIAGNOSIS — I509 Heart failure, unspecified: Secondary | ICD-10-CM | POA: Diagnosis not present

## 2017-02-05 NOTE — Therapy (Signed)
[facility] [address] [place], [place], [address] Phone: [phone]   Fax:  [phone]  Wound Care Therapy  Patient Details  Name: [name] MRN: [id number] Date of Birth: 09-Jan-1932 Referring Provider: Lonna Cobb  Encounter Date: 02/05/2017      PT End of Session - 02/05/17 1225    Visit Number 9   Number of Visits 16   Date for PT Re-Evaluation 03/01/17   Authorization Type medicare; cert 7/82-4/23.  Gcodes completed visit 8   Authorization - Visit Number 9   Authorization - Number of Visits 18   PT Start Time [phone]   PT Stop Time 1020   PT Time Calculation (min) 22 min   Activity Tolerance Patient tolerated treatment well   Behavior During Therapy WFL for tasks assessed/performed      Past Medical History:  Diagnosis Date  . Anemia associated with chronic renal failure    ESA therapy  . CHF (congestive heart failure) (HCC)     preserved left ventricular systolic function  . CKD (chronic kidney disease) stage 4, GFR 15-29 ml/min (HCC)    [name] 02/16/2013  . Diabetic foot ulcers (HCC)    "get them on borh feet" (02/16/2013)  . Headache   . Heart murmur   . History of blood transfusion 1975  . Hypertension   . Hypothyroid   . Osteoporosis   . Overweight(278.02)   . Pneumonia 02/2011   [name] 02/20/2011 (02/16/2013)  . Rheumatoid arthritis (HCC)   . Shortness of breath dyspnea    with exertion  . Type II diabetes mellitus (HCC)    patient denies  . Vaginal pessary present ~ 2012    Past Surgical History:  Procedure Laterality Date  . AV FISTULA PLACEMENT Left 01/26/2015   Procedure: LEFT ARM ARTERIOVENOUS (AV) FISTULA CREATION;  Surgeon: [name], MD;  Location: [facility] OR;  Service: Vascular;  Laterality: Left;  . CATARACT EXTRACTION W/ INTRAOCULAR LENS  IMPLANT, BILATERAL Bilateral 1990's  . COLONOSCOPY    . LIGATION OF COMPETING BRANCHES OF ARTERIOVENOUS FISTULA Left 06/01/2016   Procedure: LIGATION OF  COMPETING BRANCHES OF BRACHIOCEPHALIC ARTERIOVENOUS FISTULA LEFT ARM;  Surgeon: Chuck Hint, MD;  Location[facility] OR;  Service: Vascular;  Laterality: Left;  . ORIF FEMUR FRACTURE Left 1975   "got a rod and screw in it" (02/16/2013)  . PERIPHERAL VASCULAR CATHETERIZATION Left 05/21/2016   Procedure: A/V Lucretia Kern;  Surgeon: Chuck Hint, MD;  Location: [facility] INVASIVE CV LAB;  Service: Cardiovascular;  Laterality: Left;  upper arm    There were no vitals filed for this visit.                  Wound Therapy - 02/05/17 1222    Subjective Pt states her finger is sore sometimes but not hurting.  STates her back has not hurt since instructed how to walk with the walker correctly.     Patient and Family Stated Goals burn to heal    Date of Onset 12/12/16   Prior Treatments self care, antibiotics    Pain Assessment No/denies pain   Evaluation and Treatment Procedures Explained to Patient/Family Yes   Evaluation and Treatment Procedures agreed to   Wound Properties Date First Assessed: 01/09/17 Time First Assessed: 1314 Wound Type: Burn Location: Finger (Comment which one) , Lt index  Location Orientation: Left Wound Description (Comments): index finger  Present on Admission: Yes   Dressing Type Gauze (Comment)  medihoney,  2x2 and gauze with netting   Dressing Changed Changed   Dressing Status Clean   Dressing Change Frequency PRN   Site / Wound Assessment Clean;Dry   % Wound base Red or Granulating 95%   % Wound base Yellow/Fibrinous Exudate 5%   Peri-wound Assessment Intact   Drainage Amount Scant   Drainage Description Serosanguineous   Treatment Cleansed;Debridement (Selective)   Wound Therapy - Clinical Statement No debridement needed this session. Overall improving.  Cleansed and redressed.  Did not pack into wound as now very shallow.    Wound Therapy - Functional Problem List decreased hand grip.    Factors Delaying/Impairing Wound Healing Altered  sensation;Diabetes Mellitus;Infection - systemic/local   Hydrotherapy Plan Debridement;Dressing change;Patient/family education   Wound Therapy - Frequency --  2x/week for 4 weeks   Wound Plan continue to keep check on wound.   If purulent driange or odor returns as well as depth remains, may want to complete deeper irrigation and contact MD for more antibiotics..  Remeasure next session.   Dressing  medihoney, 2x2, kling and netting.                    PT Short Term Goals - 01/11/17 1231      PT SHORT TERM GOAL #1   Title Pt burn to be 75% granulated to reduce risk of infection    Time 10   Period Days   Status On-going     PT SHORT TERM GOAL #2   Title Pt to be able to flex finger 70% to be able to grasp objectes    Time 10   Period Days   Status Achieved     PT SHORT TERM GOAL #3   Title Pt pain to be not greater than a 5/10 to allow pt to be able to use her hands for ADL's    Time 10   Period Days   Status Achieved           PT Long Term Goals - 01/11/17 1232      PT LONG TERM GOAL #1   Title Pt burn to be totally healed to decrease risk of infection    Time 4   Period Weeks   Status On-going     PT LONG TERM GOAL #2   Title Pt to have normal ROM in her left index finger to allow pt to complete all normal activity.   Time 4   Period Weeks   Status On-going             Patient will benefit from skilled therapeutic intervention in order to improve the following deficits and impairments:     Visit Diagnosis: Pain in left hand  Partial thickness burn of finger, unspecified laterality, sequela  Open toe wound, subsequent encounter     Problem List Patient Active Problem List   Diagnosis Date Noted  . Uterovaginal prolapse, complete 03/26/2013  . Diabetic foot ulcers (HCC) 02/16/2013  . Hyperkalemia 06/24/2012  . Hyperlipidemia 05/20/2012  . Uterine prolapse 05/19/2012  . Chronic kidney disease, stage 4, severely decreased GFR  (HCC)   . Hypertension   . Anemia associated with chronic renal failure   . Hypothyroid   . CAROTID BRUIT, LEFT 05/11/2009  . DIABETES MELLITUS 03/11/2009  . OVERWEIGHT/OBESITY 03/11/2009    Lurena Nida, PTA/CLT [phone]  02/05/2017, 12:27 PM  [facility] [address] [place], [place], [address] Phone: [phone]   Fax:  [phone]  Name: [name] MRN: [id number] Date of Birth: 1932-01-28

## 2017-02-06 DIAGNOSIS — Z7689 Persons encountering health services in other specified circumstances: Secondary | ICD-10-CM | POA: Diagnosis not present

## 2017-02-06 DIAGNOSIS — Z1389 Encounter for screening for other disorder: Secondary | ICD-10-CM | POA: Diagnosis not present

## 2017-02-06 DIAGNOSIS — Z0001 Encounter for general adult medical examination with abnormal findings: Secondary | ICD-10-CM | POA: Diagnosis not present

## 2017-02-06 DIAGNOSIS — N185 Chronic kidney disease, stage 5: Secondary | ICD-10-CM | POA: Diagnosis not present

## 2017-02-06 DIAGNOSIS — E039 Hypothyroidism, unspecified: Secondary | ICD-10-CM | POA: Diagnosis not present

## 2017-02-06 DIAGNOSIS — E1129 Type 2 diabetes mellitus with other diabetic kidney complication: Secondary | ICD-10-CM | POA: Diagnosis not present

## 2017-02-07 ENCOUNTER — Telehealth (HOSPITAL_COMMUNITY): Payer: Self-pay | Admitting: Physical Therapy

## 2017-02-07 ENCOUNTER — Ambulatory Visit (HOSPITAL_COMMUNITY): Payer: Medicare Other

## 2017-02-07 ENCOUNTER — Ambulatory Visit (HOSPITAL_COMMUNITY): Payer: Medicare Other | Admitting: Physical Therapy

## 2017-02-07 DIAGNOSIS — M79642 Pain in left hand: Secondary | ICD-10-CM | POA: Diagnosis not present

## 2017-02-07 DIAGNOSIS — T23229S Burn of second degree of unspecified single finger (nail) except thumb, sequela: Secondary | ICD-10-CM

## 2017-02-07 DIAGNOSIS — I770 Arteriovenous fistula, acquired: Secondary | ICD-10-CM | POA: Diagnosis not present

## 2017-02-07 DIAGNOSIS — E1122 Type 2 diabetes mellitus with diabetic chronic kidney disease: Secondary | ICD-10-CM | POA: Diagnosis not present

## 2017-02-07 DIAGNOSIS — N186 End stage renal disease: Secondary | ICD-10-CM | POA: Diagnosis not present

## 2017-02-07 DIAGNOSIS — Z992 Dependence on renal dialysis: Secondary | ICD-10-CM | POA: Diagnosis not present

## 2017-02-07 DIAGNOSIS — D631 Anemia in chronic kidney disease: Secondary | ICD-10-CM | POA: Diagnosis not present

## 2017-02-07 DIAGNOSIS — N2581 Secondary hyperparathyroidism of renal origin: Secondary | ICD-10-CM | POA: Diagnosis not present

## 2017-02-07 DIAGNOSIS — D689 Coagulation defect, unspecified: Secondary | ICD-10-CM | POA: Diagnosis not present

## 2017-02-07 DIAGNOSIS — E039 Hypothyroidism, unspecified: Secondary | ICD-10-CM | POA: Diagnosis not present

## 2017-02-07 DIAGNOSIS — E11621 Type 2 diabetes mellitus with foot ulcer: Secondary | ICD-10-CM | POA: Diagnosis not present

## 2017-02-07 DIAGNOSIS — Z7689 Persons encountering health services in other specified circumstances: Secondary | ICD-10-CM | POA: Diagnosis not present

## 2017-02-07 DIAGNOSIS — I130 Hypertensive heart and chronic kidney disease with heart failure and stage 1 through stage 4 chronic kidney disease, or unspecified chronic kidney disease: Secondary | ICD-10-CM | POA: Diagnosis not present

## 2017-02-07 DIAGNOSIS — M810 Age-related osteoporosis without current pathological fracture: Secondary | ICD-10-CM | POA: Diagnosis not present

## 2017-02-07 DIAGNOSIS — I509 Heart failure, unspecified: Secondary | ICD-10-CM | POA: Diagnosis not present

## 2017-02-07 DIAGNOSIS — M069 Rheumatoid arthritis, unspecified: Secondary | ICD-10-CM | POA: Diagnosis not present

## 2017-02-07 DIAGNOSIS — E1129 Type 2 diabetes mellitus with other diabetic kidney complication: Secondary | ICD-10-CM | POA: Diagnosis not present

## 2017-02-07 DIAGNOSIS — N184 Chronic kidney disease, stage 4 (severe): Secondary | ICD-10-CM | POA: Diagnosis not present

## 2017-02-07 DIAGNOSIS — S91109D Unspecified open wound of unspecified toe(s) without damage to nail, subsequent encounter: Secondary | ICD-10-CM | POA: Diagnosis not present

## 2017-02-07 NOTE — Telephone Encounter (Signed)
Pt did not show for appt.  Called patient who states her ride would not wait on her this morning to get ready.  R/s appt for this afternoon. Lurena Nida, PTA/CLT [phone]

## 2017-02-07 NOTE — Therapy (Signed)
[facility] [address] [place], [place], [address] Phone: [phone]   Fax:  [phone]  Wound Care Therapy  Patient Details  Name: [name] MRN: [id number] Date of Birth: 1931-11-01 Referring Provider: Lonna Cobb  Encounter Date: 02/07/2017      PT End of Session - 02/07/17 1736    Visit Number 10   Number of Visits 16   Date for PT Re-Evaluation 03/01/17   Authorization Type medicare; cert 8/17-7/11.  Gcodes completed visit 8   Authorization - Visit Number 10   Authorization - Number of Visits 18   PT Start Time 1708   PT Stop Time 1732   PT Time Calculation (min) 24 min   Activity Tolerance Patient tolerated treatment well;No increased pain   Behavior During Therapy WFL for tasks assessed/performed      Past Medical History:  Diagnosis Date  . Anemia associated with chronic renal failure    ESA therapy  . CHF (congestive heart failure) (HCC)     preserved left ventricular systolic function  . CKD (chronic kidney disease) stage 4, GFR 15-29 ml/min (HCC)    [name] 02/16/2013  . Diabetic foot ulcers (HCC)    "get them on borh feet" (02/16/2013)  . Headache   . Heart murmur   . History of blood transfusion 1975  . Hypertension   . Hypothyroid   . Osteoporosis   . Overweight(278.02)   . Pneumonia 02/2011   [name] 02/20/2011 (02/16/2013)  . Rheumatoid arthritis (HCC)   . Shortness of breath dyspnea    with exertion  . Type II diabetes mellitus (HCC)    patient denies  . Vaginal pessary present ~ 2012    Past Surgical History:  Procedure Laterality Date  . AV FISTULA PLACEMENT Left 01/26/2015   Procedure: LEFT ARM ARTERIOVENOUS (AV) FISTULA CREATION;  Surgeon: [name], MD;  Location: [facility] OR;  Service: Vascular;  Laterality: Left;  . CATARACT EXTRACTION W/ INTRAOCULAR LENS  IMPLANT, BILATERAL Bilateral 1990's  . COLONOSCOPY    . LIGATION OF COMPETING BRANCHES OF ARTERIOVENOUS FISTULA Left 06/01/2016   Procedure: LIGATION OF COMPETING BRANCHES OF BRACHIOCEPHALIC ARTERIOVENOUS FISTULA LEFT ARM;  Surgeon: Chuck Hint, MD;  Location: [facility] OR;  Service: Vascular;  Laterality: Left;  . ORIF FEMUR FRACTURE Left 1975   "got a rod and screw in it" (02/16/2013)  . PERIPHERAL VASCULAR CATHETERIZATION Left 05/21/2016   Procedure: A/V Lucretia Kern;  Surgeon: Chuck Hint, MD;  Location: [facility] INVASIVE CV LAB;  Service: Cardiovascular;  Laterality: Left;  upper arm    There were no vitals filed for this visit.       Subjective Assessment - 02/07/17 1731    Subjective Pt stated her arm is sore following dialysis, pain scale 4/10   Pain Score 4    Pain Location Arm   Pain Orientation Left   Pain Descriptors / Indicators Sore   Pain Type Acute pain                   Wound Therapy - 02/07/17 1731    Subjective Pt stated her arm is sore following dialysis, pain scale 4/10   Patient and Family Stated Goals burn to heal    Date of Onset 12/12/16   Prior Treatments self care, antibiotics    Pain Assessment 0-10   Pain Onset Awakened from sleep   Patients Stated Pain Goal 0   Pain Intervention(s) Repositioned;Emotional support   Multiple Pain  Sites No   Evaluation and Treatment Procedures Explained to Patient/Family Yes   Evaluation and Treatment Procedures agreed to   Wound Properties Date First Assessed: 01/09/17 Time First Assessed: 1314 Wound Type: Burn Location: Finger (Comment which one) , Lt index  Location Orientation: Left Wound Description (Comments): index finger  Present on Admission: Yes   Dressing Type Gauze (Comment)  medihoney, 2x2 and gauze with netting   Dressing Changed Changed   Dressing Status Clean   Dressing Change Frequency PRN   Site / Wound Assessment Clean;Dry   % Wound base Red or Granulating 95%   % Wound base Yellow/Fibrinous Exudate 5%   Wound Length (cm) 0.3 cm  was .7   Wound Width (cm) 0.2 cm  was .3   Wound Depth (cm) 0.1 cm  was  unknown   Undermining (cm) unable to find undermining this session   Drainage Amount Scant   Drainage Description Serosanguineous   Treatment Cleansed;Debridement (Selective)   Selective Debridement - Location wound bed   Selective Debridement - Tools Used Forceps   Selective Debridement - Tissue Removed slough   Wound Therapy - Clinical Statement No debridement needed this session, cleansed and dressed.  Unable to find undermining wiht wound today.  Overall wound is improving with minimal drainage and no s/s of symptoms.     Wound Therapy - Functional Problem List decreased hand grip.    Factors Delaying/Impairing Wound Healing Altered sensation;Diabetes Mellitus;Infection - systemic/local   Hydrotherapy Plan Debridement;Dressing change;Patient/family education   Wound Therapy - Frequency --  2x/week x 4 weeks   Wound Plan continue to keep check on wound.   If purulent driange or odor returns as well as depth remains, may want to complete deeper irrigation and contact MD for more antibiotics..  Remeasure next session.   Dressing  medihoney, 2x2, kling and netting.                    PT Short Term Goals - 01/11/17 1231      PT SHORT TERM GOAL #1   Title Pt burn to be 75% granulated to reduce risk of infection    Time 10   Period Days   Status On-going     PT SHORT TERM GOAL #2   Title Pt to be able to flex finger 70% to be able to grasp objectes    Time 10   Period Days   Status Achieved     PT SHORT TERM GOAL #3   Title Pt pain to be not greater than a 5/10 to allow pt to be able to use her hands for ADL's    Time 10   Period Days   Status Achieved           PT Long Term Goals - 01/11/17 1232      PT LONG TERM GOAL #1   Title Pt burn to be totally healed to decrease risk of infection    Time 4   Period Weeks   Status On-going     PT LONG TERM GOAL #2   Title Pt to have normal ROM in her left index finger to allow pt to complete all normal activity.    Time 4   Period Weeks   Status On-going             Patient will benefit from skilled therapeutic intervention in order to improve the following deficits and impairments:     Visit Diagnosis: Pain in left  hand  Partial thickness burn of finger, unspecified laterality, sequela     Problem List Patient Active Problem List   Diagnosis Date Noted  . Uterovaginal prolapse, complete 03/26/2013  . Diabetic foot ulcers (HCC) 02/16/2013  . Hyperkalemia 06/24/2012  . Hyperlipidemia 05/20/2012  . Uterine prolapse 05/19/2012  . Chronic kidney disease, stage 4, severely decreased GFR (HCC)   . Hypertension   . Anemia associated with chronic renal failure   . Hypothyroid   . CAROTID BRUIT, LEFT 05/11/2009  . DIABETES MELLITUS 03/11/2009  . OVERWEIGHT/OBESITY 03/11/2009   Becky Sax, LPTA; CBIS [phone]  Juel Burrow 02/07/2017, 5:37 PM  [facility] [address] [place], [place], [address] Phone: [phone]   Fax:  [phone]  Name: [name] MRN: [id number] Date of Birth: 13-Apr-1932

## 2017-02-09 DIAGNOSIS — N186 End stage renal disease: Secondary | ICD-10-CM | POA: Diagnosis not present

## 2017-02-09 DIAGNOSIS — N2581 Secondary hyperparathyroidism of renal origin: Secondary | ICD-10-CM | POA: Diagnosis not present

## 2017-02-09 DIAGNOSIS — E1129 Type 2 diabetes mellitus with other diabetic kidney complication: Secondary | ICD-10-CM | POA: Diagnosis not present

## 2017-02-09 DIAGNOSIS — Z7689 Persons encountering health services in other specified circumstances: Secondary | ICD-10-CM | POA: Diagnosis not present

## 2017-02-09 DIAGNOSIS — D689 Coagulation defect, unspecified: Secondary | ICD-10-CM | POA: Diagnosis not present

## 2017-02-09 DIAGNOSIS — D631 Anemia in chronic kidney disease: Secondary | ICD-10-CM | POA: Diagnosis not present

## 2017-02-12 DIAGNOSIS — N186 End stage renal disease: Secondary | ICD-10-CM | POA: Diagnosis not present

## 2017-02-12 DIAGNOSIS — N2581 Secondary hyperparathyroidism of renal origin: Secondary | ICD-10-CM | POA: Diagnosis not present

## 2017-02-12 DIAGNOSIS — D689 Coagulation defect, unspecified: Secondary | ICD-10-CM | POA: Diagnosis not present

## 2017-02-12 DIAGNOSIS — D631 Anemia in chronic kidney disease: Secondary | ICD-10-CM | POA: Diagnosis not present

## 2017-02-12 DIAGNOSIS — Z7689 Persons encountering health services in other specified circumstances: Secondary | ICD-10-CM | POA: Diagnosis not present

## 2017-02-12 DIAGNOSIS — E1129 Type 2 diabetes mellitus with other diabetic kidney complication: Secondary | ICD-10-CM | POA: Diagnosis not present

## 2017-02-13 ENCOUNTER — Ambulatory Visit (HOSPITAL_COMMUNITY): Payer: Medicare Other | Attending: Internal Medicine

## 2017-02-13 DIAGNOSIS — M79642 Pain in left hand: Secondary | ICD-10-CM

## 2017-02-13 DIAGNOSIS — T23222A Burn of second degree of single left finger (nail) except thumb, initial encounter: Secondary | ICD-10-CM | POA: Insufficient documentation

## 2017-02-13 DIAGNOSIS — Z7689 Persons encountering health services in other specified circumstances: Secondary | ICD-10-CM | POA: Diagnosis not present

## 2017-02-13 DIAGNOSIS — T23229S Burn of second degree of unspecified single finger (nail) except thumb, sequela: Secondary | ICD-10-CM

## 2017-02-13 DIAGNOSIS — X088XXA Exposure to other specified smoke, fire and flames, initial encounter: Secondary | ICD-10-CM | POA: Diagnosis not present

## 2017-02-13 NOTE — Therapy (Signed)
[place], [place], [address] Phone: [phone]   Fax:  [phone]  Wound Care Therapy  Patient Details  Name: [name] MRN: [id number] Date of Birth: June 09, 1932 Referring Provider: Nicholes Mango  Encounter Date: 02/13/2017      PT End of Session - 02/13/17 1025    Visit Number 11   Number of Visits 11   Date for PT Re-Evaluation 03/01/17   Authorization Type medicare; cert 7/89-3/81.  Gcodes completed visit 8   Authorization - Visit Number 11   Authorization - Number of Visits 11   PT Start Time [phone]   PT Stop Time 1015   PT Time Calculation (min) 25 min   Activity Tolerance Patient tolerated treatment well;No increased pain   Behavior During Therapy WFL for tasks assessed/performed      Past Medical History:  Diagnosis Date  . Anemia associated with chronic renal failure    ESA therapy  . CHF (congestive heart failure) ([place])     preserved left ventricular systolic function  . CKD (chronic kidney disease) stage 4, GFR 15-29 ml/min (HCC)    Archie Endo 02/16/2013  . Diabetic foot ulcers ([place])    "get them on borh feet" (02/16/2013)  . Headache   . Heart murmur   . History of blood transfusion 1975  . Hypertension   . Hypothyroid   . Osteoporosis   . Overweight(278.02)   . Pneumonia 02/2011   Archie Endo 02/20/2011 (02/16/2013)  . Rheumatoid arthritis ([place])   . Shortness of breath dyspnea    with exertion  . Type II diabetes mellitus ([place])    patient denies  . Vaginal pessary present ~ 2012    Past Surgical History:  Procedure Laterality Date  . AV FISTULA PLACEMENT Left 01/26/2015   Procedure: LEFT ARM ARTERIOVENOUS (AV) FISTULA CREATION;  Surgeon: Conrad Pulcifer, MD;  Location: [place];  Service: Vascular;  Laterality: Left;  . CATARACT EXTRACTION W/ INTRAOCULAR LENS  IMPLANT, BILATERAL Bilateral 1990's  . COLONOSCOPY    . LIGATION OF COMPETING BRANCHES OF ARTERIOVENOUS FISTULA Left 06/01/2016   Procedure: LIGATION OF COMPETING BRANCHES OF BRACHIOCEPHALIC ARTERIOVENOUS FISTULA LEFT ARM;  Surgeon: Angelia Mould, MD;  Location: [place];  Service: Vascular;  Laterality: Left;  . ORIF FEMUR FRACTURE Left 1975   "got a rod and screw in it" (02/16/2013)  . PERIPHERAL VASCULAR CATHETERIZATION Left 05/21/2016   Procedure: A/V Nolon Stalls;  Surgeon: Angelia Mould, MD;  Location: [place] CV LAB;  Service: Cardiovascular;  Laterality: Left;  upper arm    There were no vitals filed for this visit.       Subjective Assessment - 02/13/17 1023    Subjective Pt stated her finger is feeling good, continues to c/o Lt arm pain following dialysis with new bruising Lt arm.     Currently in Pain? Yes   Pain Score 3    Pain Location Arm   Pain Orientation Left   Pain Descriptors / Indicators Sore   Pain Type Acute pain                   Wound Therapy - 02/13/17 1023    Subjective Pt stated her finger is feeling good, continues to c/o Lt arm pain following dialysis with new bruising Lt arm.     Patient and Family Stated Goals burn to heal    Date of Onset 12/12/16   Prior Treatments self care, antibiotics  Pain Assessment 0-10   Pain Onset Awakened from sleep   Patients Stated Pain Goal 0   Pain Intervention(s) Repositioned;Emotional support   Multiple Pain Sites No   Evaluation and Treatment Procedures Explained to Patient/Family Yes   Evaluation and Treatment Procedures agreed to   Wound Properties Date First Assessed: 01/09/17 Time First Assessed: 7510 Wound Type: Burn Location: Finger (Comment which one) , Lt index  Location Orientation: Left Wound Description (Comments): index finger  Present on Admission: Yes   Dressing Type Gauze (Comment)  medihoney, bandaid and gauze with netting   Dressing Changed Changed   Dressing Status Clean   Dressing Change Frequency PRN   Site / Wound Assessment Clean;Dry   % Wound base Red or Granulating 100%   % Wound base  Yellow/Fibrinous Exudate 0%   Wound Length (cm) 0.2 cm  was .7 01/09/17   Wound Width (cm) 0.1 cm  was .3 01/09/17   Wound Depth (cm) 0.1 cm  unknown initially   Undermining (cm) .1  was unknown at least 1.0 from 6 to 12   Drainage Amount None   Treatment Cleansed  cleansed and educated for proper care at home   Selective Debridement - Location no debridement complete this session   Wound Therapy - Clinical Statement  No debridement necessary this session.  Wound at 100% granulation wiht no drainage noted and minimal opening.  Cleansed and discussed proper care with pt., pt able to verbalize appropriate care for wound and stated understanding/confidence with care at home.  Evaluation therapist aware of wound.     Wound Therapy - Functional Problem List decreased hand grip.    Factors Delaying/Impairing Wound Healing Altered sensation;Diabetes Mellitus;Infection - systemic/local   Hydrotherapy Plan Debridement;Dressing change;Patient/family education   Wound Plan D/C to home care per no skilled intervention needed   Dressing  medihoney, 2x2, kling and netting.                    PT Short Term Goals - 01/11/17 1231      PT SHORT TERM GOAL #1   Title Pt burn to be 75% granulated to reduce risk of infection    Time 10   Period Days   Status On-going     PT SHORT TERM GOAL #2   Title Pt to be able to flex finger 70% to be able to grasp objectes    Time 10   Period Days   Status Achieved     PT SHORT TERM GOAL #3   Title Pt pain to be not greater than a 5/10 to allow pt to be able to use her hands for ADL's    Time 10   Period Days   Status Achieved           PT Long Term Goals - 01/11/17 1232      PT LONG TERM GOAL #1   Title Pt burn to be totally healed to decrease risk of infection    Time 4   Period Weeks   Status On-going     PT LONG TERM GOAL #2   Title Pt to have normal ROM in her left index finger to allow pt to complete all normal activity.   Time  4   Period Weeks   Status Achieved              Patient will benefit from skilled therapeutic intervention in order to improve the following deficits and impairments:  Visit Diagnosis: Pain in left hand  Partial thickness burn of finger, unspecified laterality, sequela     Problem List Patient Active Problem List   Diagnosis Date Noted  . Uterovaginal prolapse, complete 03/26/2013  . Diabetic foot ulcers ([place]) 02/16/2013  . Hyperkalemia 06/24/2012  . Hyperlipidemia 05/20/2012  . Uterine prolapse 05/19/2012  . Chronic kidney disease, stage 4, severely decreased GFR (HCC)   . Hypertension   . Anemia associated with chronic renal failure   . Hypothyroid   . CAROTID BRUIT, LEFT 05/11/2009  . DIABETES MELLITUS 03/11/2009  . OVERWEIGHT/OBESITY 03/11/2009   Ihor Austin, [place]; [place]  Rayetta Humphrey, PT CLT [phone] 02/13/2017, 12:25 PM  [place] [address] [place], [place], [address] Phone: [phone]   Fax:  [phone]  Name: KYESHA BALLA MRN: [id number] Date of Birth: May 07, 1932  PHYSICAL THERAPY DISCHARGE SUMMARY  Visits from Start of Care: 11  Current functional level related to goals / functional outcomes: See above   Remaining deficits: See above   Education / Equipment: Keep burn clean until fully healed Plan: Patient agrees to discharge.  Patient goals were met. Patient is being discharged due to meeting the stated rehab goals.  ?????       Rayetta Humphrey, [place] CLT [phone]

## 2017-02-14 DIAGNOSIS — N2581 Secondary hyperparathyroidism of renal origin: Secondary | ICD-10-CM | POA: Diagnosis not present

## 2017-02-14 DIAGNOSIS — D631 Anemia in chronic kidney disease: Secondary | ICD-10-CM | POA: Diagnosis not present

## 2017-02-14 DIAGNOSIS — Z7689 Persons encountering health services in other specified circumstances: Secondary | ICD-10-CM | POA: Diagnosis not present

## 2017-02-14 DIAGNOSIS — E1129 Type 2 diabetes mellitus with other diabetic kidney complication: Secondary | ICD-10-CM | POA: Diagnosis not present

## 2017-02-14 DIAGNOSIS — D689 Coagulation defect, unspecified: Secondary | ICD-10-CM | POA: Diagnosis not present

## 2017-02-14 DIAGNOSIS — N186 End stage renal disease: Secondary | ICD-10-CM | POA: Diagnosis not present

## 2017-02-15 ENCOUNTER — Telehealth (HOSPITAL_COMMUNITY): Payer: Self-pay

## 2017-02-15 ENCOUNTER — Ambulatory Visit (HOSPITAL_COMMUNITY): Payer: Medicare Other

## 2017-02-15 DIAGNOSIS — Z7689 Persons encountering health services in other specified circumstances: Secondary | ICD-10-CM | POA: Diagnosis not present

## 2017-02-15 NOTE — Telephone Encounter (Signed)
02/15/17 pt cancelled this appt via the phone tree.  Its the last one on her schedule so I will call and see about rescheduling her.

## 2017-02-16 DIAGNOSIS — N186 End stage renal disease: Secondary | ICD-10-CM | POA: Diagnosis not present

## 2017-02-16 DIAGNOSIS — N2581 Secondary hyperparathyroidism of renal origin: Secondary | ICD-10-CM | POA: Diagnosis not present

## 2017-02-16 DIAGNOSIS — Z7689 Persons encountering health services in other specified circumstances: Secondary | ICD-10-CM | POA: Diagnosis not present

## 2017-02-16 DIAGNOSIS — E1129 Type 2 diabetes mellitus with other diabetic kidney complication: Secondary | ICD-10-CM | POA: Diagnosis not present

## 2017-02-16 DIAGNOSIS — D631 Anemia in chronic kidney disease: Secondary | ICD-10-CM | POA: Diagnosis not present

## 2017-02-16 DIAGNOSIS — D689 Coagulation defect, unspecified: Secondary | ICD-10-CM | POA: Diagnosis not present

## 2017-02-18 DIAGNOSIS — D631 Anemia in chronic kidney disease: Secondary | ICD-10-CM | POA: Diagnosis not present

## 2017-02-18 DIAGNOSIS — E1129 Type 2 diabetes mellitus with other diabetic kidney complication: Secondary | ICD-10-CM | POA: Diagnosis not present

## 2017-02-18 DIAGNOSIS — D689 Coagulation defect, unspecified: Secondary | ICD-10-CM | POA: Diagnosis not present

## 2017-02-18 DIAGNOSIS — N2581 Secondary hyperparathyroidism of renal origin: Secondary | ICD-10-CM | POA: Diagnosis not present

## 2017-02-18 DIAGNOSIS — N186 End stage renal disease: Secondary | ICD-10-CM | POA: Diagnosis not present

## 2017-02-19 DIAGNOSIS — D689 Coagulation defect, unspecified: Secondary | ICD-10-CM | POA: Diagnosis not present

## 2017-02-19 DIAGNOSIS — E1129 Type 2 diabetes mellitus with other diabetic kidney complication: Secondary | ICD-10-CM | POA: Diagnosis not present

## 2017-02-19 DIAGNOSIS — N2581 Secondary hyperparathyroidism of renal origin: Secondary | ICD-10-CM | POA: Diagnosis not present

## 2017-02-19 DIAGNOSIS — D631 Anemia in chronic kidney disease: Secondary | ICD-10-CM | POA: Diagnosis not present

## 2017-02-19 DIAGNOSIS — Z7689 Persons encountering health services in other specified circumstances: Secondary | ICD-10-CM | POA: Diagnosis not present

## 2017-02-19 DIAGNOSIS — N186 End stage renal disease: Secondary | ICD-10-CM | POA: Diagnosis not present

## 2017-02-21 DIAGNOSIS — E1129 Type 2 diabetes mellitus with other diabetic kidney complication: Secondary | ICD-10-CM | POA: Diagnosis not present

## 2017-02-21 DIAGNOSIS — N186 End stage renal disease: Secondary | ICD-10-CM | POA: Diagnosis not present

## 2017-02-21 DIAGNOSIS — Z7689 Persons encountering health services in other specified circumstances: Secondary | ICD-10-CM | POA: Diagnosis not present

## 2017-02-21 DIAGNOSIS — D689 Coagulation defect, unspecified: Secondary | ICD-10-CM | POA: Diagnosis not present

## 2017-02-21 DIAGNOSIS — N2581 Secondary hyperparathyroidism of renal origin: Secondary | ICD-10-CM | POA: Diagnosis not present

## 2017-02-21 DIAGNOSIS — D631 Anemia in chronic kidney disease: Secondary | ICD-10-CM | POA: Diagnosis not present

## 2017-02-23 DIAGNOSIS — D631 Anemia in chronic kidney disease: Secondary | ICD-10-CM | POA: Diagnosis not present

## 2017-02-23 DIAGNOSIS — N186 End stage renal disease: Secondary | ICD-10-CM | POA: Diagnosis not present

## 2017-02-23 DIAGNOSIS — D689 Coagulation defect, unspecified: Secondary | ICD-10-CM | POA: Diagnosis not present

## 2017-02-23 DIAGNOSIS — N2581 Secondary hyperparathyroidism of renal origin: Secondary | ICD-10-CM | POA: Diagnosis not present

## 2017-02-23 DIAGNOSIS — E1129 Type 2 diabetes mellitus with other diabetic kidney complication: Secondary | ICD-10-CM | POA: Diagnosis not present

## 2017-02-23 DIAGNOSIS — Z7689 Persons encountering health services in other specified circumstances: Secondary | ICD-10-CM | POA: Diagnosis not present

## 2017-02-26 DIAGNOSIS — D631 Anemia in chronic kidney disease: Secondary | ICD-10-CM | POA: Diagnosis not present

## 2017-02-26 DIAGNOSIS — D689 Coagulation defect, unspecified: Secondary | ICD-10-CM | POA: Diagnosis not present

## 2017-02-26 DIAGNOSIS — N2581 Secondary hyperparathyroidism of renal origin: Secondary | ICD-10-CM | POA: Diagnosis not present

## 2017-02-26 DIAGNOSIS — Z7689 Persons encountering health services in other specified circumstances: Secondary | ICD-10-CM | POA: Diagnosis not present

## 2017-02-26 DIAGNOSIS — N186 End stage renal disease: Secondary | ICD-10-CM | POA: Diagnosis not present

## 2017-02-26 DIAGNOSIS — E1129 Type 2 diabetes mellitus with other diabetic kidney complication: Secondary | ICD-10-CM | POA: Diagnosis not present

## 2017-02-28 DIAGNOSIS — D631 Anemia in chronic kidney disease: Secondary | ICD-10-CM | POA: Diagnosis not present

## 2017-02-28 DIAGNOSIS — D689 Coagulation defect, unspecified: Secondary | ICD-10-CM | POA: Diagnosis not present

## 2017-02-28 DIAGNOSIS — N2581 Secondary hyperparathyroidism of renal origin: Secondary | ICD-10-CM | POA: Diagnosis not present

## 2017-02-28 DIAGNOSIS — Z7689 Persons encountering health services in other specified circumstances: Secondary | ICD-10-CM | POA: Diagnosis not present

## 2017-02-28 DIAGNOSIS — N186 End stage renal disease: Secondary | ICD-10-CM | POA: Diagnosis not present

## 2017-02-28 DIAGNOSIS — E1129 Type 2 diabetes mellitus with other diabetic kidney complication: Secondary | ICD-10-CM | POA: Diagnosis not present

## 2017-03-02 DIAGNOSIS — D689 Coagulation defect, unspecified: Secondary | ICD-10-CM | POA: Diagnosis not present

## 2017-03-02 DIAGNOSIS — N2581 Secondary hyperparathyroidism of renal origin: Secondary | ICD-10-CM | POA: Diagnosis not present

## 2017-03-02 DIAGNOSIS — D631 Anemia in chronic kidney disease: Secondary | ICD-10-CM | POA: Diagnosis not present

## 2017-03-02 DIAGNOSIS — E1129 Type 2 diabetes mellitus with other diabetic kidney complication: Secondary | ICD-10-CM | POA: Diagnosis not present

## 2017-03-02 DIAGNOSIS — N186 End stage renal disease: Secondary | ICD-10-CM | POA: Diagnosis not present

## 2017-03-02 DIAGNOSIS — Z7689 Persons encountering health services in other specified circumstances: Secondary | ICD-10-CM | POA: Diagnosis not present

## 2017-03-03 DIAGNOSIS — I10 Essential (primary) hypertension: Secondary | ICD-10-CM | POA: Diagnosis not present

## 2017-03-05 DIAGNOSIS — N2581 Secondary hyperparathyroidism of renal origin: Secondary | ICD-10-CM | POA: Diagnosis not present

## 2017-03-05 DIAGNOSIS — E1129 Type 2 diabetes mellitus with other diabetic kidney complication: Secondary | ICD-10-CM | POA: Diagnosis not present

## 2017-03-05 DIAGNOSIS — N186 End stage renal disease: Secondary | ICD-10-CM | POA: Diagnosis not present

## 2017-03-05 DIAGNOSIS — D689 Coagulation defect, unspecified: Secondary | ICD-10-CM | POA: Diagnosis not present

## 2017-03-05 DIAGNOSIS — D631 Anemia in chronic kidney disease: Secondary | ICD-10-CM | POA: Diagnosis not present

## 2017-03-05 DIAGNOSIS — Z7689 Persons encountering health services in other specified circumstances: Secondary | ICD-10-CM | POA: Diagnosis not present

## 2017-03-07 DIAGNOSIS — E1129 Type 2 diabetes mellitus with other diabetic kidney complication: Secondary | ICD-10-CM | POA: Diagnosis not present

## 2017-03-07 DIAGNOSIS — N186 End stage renal disease: Secondary | ICD-10-CM | POA: Diagnosis not present

## 2017-03-07 DIAGNOSIS — D631 Anemia in chronic kidney disease: Secondary | ICD-10-CM | POA: Diagnosis not present

## 2017-03-07 DIAGNOSIS — D689 Coagulation defect, unspecified: Secondary | ICD-10-CM | POA: Diagnosis not present

## 2017-03-07 DIAGNOSIS — Z7689 Persons encountering health services in other specified circumstances: Secondary | ICD-10-CM | POA: Diagnosis not present

## 2017-03-07 DIAGNOSIS — N2581 Secondary hyperparathyroidism of renal origin: Secondary | ICD-10-CM | POA: Diagnosis not present

## 2017-03-09 DIAGNOSIS — Z7689 Persons encountering health services in other specified circumstances: Secondary | ICD-10-CM | POA: Diagnosis not present

## 2017-03-09 DIAGNOSIS — E1129 Type 2 diabetes mellitus with other diabetic kidney complication: Secondary | ICD-10-CM | POA: Diagnosis not present

## 2017-03-09 DIAGNOSIS — E1122 Type 2 diabetes mellitus with diabetic chronic kidney disease: Secondary | ICD-10-CM | POA: Diagnosis not present

## 2017-03-09 DIAGNOSIS — D689 Coagulation defect, unspecified: Secondary | ICD-10-CM | POA: Diagnosis not present

## 2017-03-09 DIAGNOSIS — N186 End stage renal disease: Secondary | ICD-10-CM | POA: Diagnosis not present

## 2017-03-09 DIAGNOSIS — N2581 Secondary hyperparathyroidism of renal origin: Secondary | ICD-10-CM | POA: Diagnosis not present

## 2017-03-09 DIAGNOSIS — D631 Anemia in chronic kidney disease: Secondary | ICD-10-CM | POA: Diagnosis not present

## 2017-03-09 DIAGNOSIS — Z992 Dependence on renal dialysis: Secondary | ICD-10-CM | POA: Diagnosis not present

## 2017-03-11 DIAGNOSIS — Z7689 Persons encountering health services in other specified circumstances: Secondary | ICD-10-CM | POA: Diagnosis not present

## 2017-03-11 DIAGNOSIS — E039 Hypothyroidism, unspecified: Secondary | ICD-10-CM | POA: Diagnosis not present

## 2017-03-11 DIAGNOSIS — E785 Hyperlipidemia, unspecified: Secondary | ICD-10-CM | POA: Diagnosis not present

## 2017-03-12 DIAGNOSIS — N186 End stage renal disease: Secondary | ICD-10-CM | POA: Diagnosis not present

## 2017-03-12 DIAGNOSIS — Z23 Encounter for immunization: Secondary | ICD-10-CM | POA: Diagnosis not present

## 2017-03-12 DIAGNOSIS — D689 Coagulation defect, unspecified: Secondary | ICD-10-CM | POA: Diagnosis not present

## 2017-03-12 DIAGNOSIS — E1129 Type 2 diabetes mellitus with other diabetic kidney complication: Secondary | ICD-10-CM | POA: Diagnosis not present

## 2017-03-12 DIAGNOSIS — N2581 Secondary hyperparathyroidism of renal origin: Secondary | ICD-10-CM | POA: Diagnosis not present

## 2017-03-12 DIAGNOSIS — D631 Anemia in chronic kidney disease: Secondary | ICD-10-CM | POA: Diagnosis not present

## 2017-03-12 DIAGNOSIS — Z7689 Persons encountering health services in other specified circumstances: Secondary | ICD-10-CM | POA: Diagnosis not present

## 2017-03-14 DIAGNOSIS — N2581 Secondary hyperparathyroidism of renal origin: Secondary | ICD-10-CM | POA: Diagnosis not present

## 2017-03-14 DIAGNOSIS — Z23 Encounter for immunization: Secondary | ICD-10-CM | POA: Diagnosis not present

## 2017-03-14 DIAGNOSIS — Z7689 Persons encountering health services in other specified circumstances: Secondary | ICD-10-CM | POA: Diagnosis not present

## 2017-03-14 DIAGNOSIS — N186 End stage renal disease: Secondary | ICD-10-CM | POA: Diagnosis not present

## 2017-03-14 DIAGNOSIS — E1129 Type 2 diabetes mellitus with other diabetic kidney complication: Secondary | ICD-10-CM | POA: Diagnosis not present

## 2017-03-14 DIAGNOSIS — D631 Anemia in chronic kidney disease: Secondary | ICD-10-CM | POA: Diagnosis not present

## 2017-03-14 DIAGNOSIS — D689 Coagulation defect, unspecified: Secondary | ICD-10-CM | POA: Diagnosis not present

## 2017-03-16 DIAGNOSIS — Z23 Encounter for immunization: Secondary | ICD-10-CM | POA: Diagnosis not present

## 2017-03-16 DIAGNOSIS — Z7689 Persons encountering health services in other specified circumstances: Secondary | ICD-10-CM | POA: Diagnosis not present

## 2017-03-16 DIAGNOSIS — D689 Coagulation defect, unspecified: Secondary | ICD-10-CM | POA: Diagnosis not present

## 2017-03-16 DIAGNOSIS — E1129 Type 2 diabetes mellitus with other diabetic kidney complication: Secondary | ICD-10-CM | POA: Diagnosis not present

## 2017-03-16 DIAGNOSIS — D631 Anemia in chronic kidney disease: Secondary | ICD-10-CM | POA: Diagnosis not present

## 2017-03-16 DIAGNOSIS — N186 End stage renal disease: Secondary | ICD-10-CM | POA: Diagnosis not present

## 2017-03-16 DIAGNOSIS — N2581 Secondary hyperparathyroidism of renal origin: Secondary | ICD-10-CM | POA: Diagnosis not present

## 2017-03-19 DIAGNOSIS — N186 End stage renal disease: Secondary | ICD-10-CM | POA: Diagnosis not present

## 2017-03-19 DIAGNOSIS — Z7689 Persons encountering health services in other specified circumstances: Secondary | ICD-10-CM | POA: Diagnosis not present

## 2017-03-19 DIAGNOSIS — Z23 Encounter for immunization: Secondary | ICD-10-CM | POA: Diagnosis not present

## 2017-03-19 DIAGNOSIS — D631 Anemia in chronic kidney disease: Secondary | ICD-10-CM | POA: Diagnosis not present

## 2017-03-19 DIAGNOSIS — E1129 Type 2 diabetes mellitus with other diabetic kidney complication: Secondary | ICD-10-CM | POA: Diagnosis not present

## 2017-03-19 DIAGNOSIS — N2581 Secondary hyperparathyroidism of renal origin: Secondary | ICD-10-CM | POA: Diagnosis not present

## 2017-03-19 DIAGNOSIS — D689 Coagulation defect, unspecified: Secondary | ICD-10-CM | POA: Diagnosis not present

## 2017-03-21 DIAGNOSIS — N2581 Secondary hyperparathyroidism of renal origin: Secondary | ICD-10-CM | POA: Diagnosis not present

## 2017-03-21 DIAGNOSIS — D631 Anemia in chronic kidney disease: Secondary | ICD-10-CM | POA: Diagnosis not present

## 2017-03-21 DIAGNOSIS — D689 Coagulation defect, unspecified: Secondary | ICD-10-CM | POA: Diagnosis not present

## 2017-03-21 DIAGNOSIS — N186 End stage renal disease: Secondary | ICD-10-CM | POA: Diagnosis not present

## 2017-03-21 DIAGNOSIS — E1129 Type 2 diabetes mellitus with other diabetic kidney complication: Secondary | ICD-10-CM | POA: Diagnosis not present

## 2017-03-21 DIAGNOSIS — Z23 Encounter for immunization: Secondary | ICD-10-CM | POA: Diagnosis not present

## 2017-03-21 DIAGNOSIS — Z7689 Persons encountering health services in other specified circumstances: Secondary | ICD-10-CM | POA: Diagnosis not present

## 2017-03-23 DIAGNOSIS — D631 Anemia in chronic kidney disease: Secondary | ICD-10-CM | POA: Diagnosis not present

## 2017-03-23 DIAGNOSIS — N2581 Secondary hyperparathyroidism of renal origin: Secondary | ICD-10-CM | POA: Diagnosis not present

## 2017-03-23 DIAGNOSIS — D689 Coagulation defect, unspecified: Secondary | ICD-10-CM | POA: Diagnosis not present

## 2017-03-23 DIAGNOSIS — N186 End stage renal disease: Secondary | ICD-10-CM | POA: Diagnosis not present

## 2017-03-23 DIAGNOSIS — E1129 Type 2 diabetes mellitus with other diabetic kidney complication: Secondary | ICD-10-CM | POA: Diagnosis not present

## 2017-03-23 DIAGNOSIS — Z7689 Persons encountering health services in other specified circumstances: Secondary | ICD-10-CM | POA: Diagnosis not present

## 2017-03-23 DIAGNOSIS — Z23 Encounter for immunization: Secondary | ICD-10-CM | POA: Diagnosis not present

## 2017-03-26 DIAGNOSIS — E1129 Type 2 diabetes mellitus with other diabetic kidney complication: Secondary | ICD-10-CM | POA: Diagnosis not present

## 2017-03-26 DIAGNOSIS — N186 End stage renal disease: Secondary | ICD-10-CM | POA: Diagnosis not present

## 2017-03-26 DIAGNOSIS — Z23 Encounter for immunization: Secondary | ICD-10-CM | POA: Diagnosis not present

## 2017-03-26 DIAGNOSIS — Z7689 Persons encountering health services in other specified circumstances: Secondary | ICD-10-CM | POA: Diagnosis not present

## 2017-03-26 DIAGNOSIS — D689 Coagulation defect, unspecified: Secondary | ICD-10-CM | POA: Diagnosis not present

## 2017-03-26 DIAGNOSIS — D631 Anemia in chronic kidney disease: Secondary | ICD-10-CM | POA: Diagnosis not present

## 2017-03-26 DIAGNOSIS — N2581 Secondary hyperparathyroidism of renal origin: Secondary | ICD-10-CM | POA: Diagnosis not present

## 2017-03-28 DIAGNOSIS — N2581 Secondary hyperparathyroidism of renal origin: Secondary | ICD-10-CM | POA: Diagnosis not present

## 2017-03-28 DIAGNOSIS — E1129 Type 2 diabetes mellitus with other diabetic kidney complication: Secondary | ICD-10-CM | POA: Diagnosis not present

## 2017-03-28 DIAGNOSIS — N186 End stage renal disease: Secondary | ICD-10-CM | POA: Diagnosis not present

## 2017-03-28 DIAGNOSIS — Z7689 Persons encountering health services in other specified circumstances: Secondary | ICD-10-CM | POA: Diagnosis not present

## 2017-03-28 DIAGNOSIS — Z23 Encounter for immunization: Secondary | ICD-10-CM | POA: Diagnosis not present

## 2017-03-28 DIAGNOSIS — D689 Coagulation defect, unspecified: Secondary | ICD-10-CM | POA: Diagnosis not present

## 2017-03-28 DIAGNOSIS — D631 Anemia in chronic kidney disease: Secondary | ICD-10-CM | POA: Diagnosis not present

## 2017-03-29 DIAGNOSIS — N186 End stage renal disease: Secondary | ICD-10-CM | POA: Diagnosis not present

## 2017-04-01 DIAGNOSIS — Z7689 Persons encountering health services in other specified circumstances: Secondary | ICD-10-CM | POA: Diagnosis not present

## 2017-04-01 DIAGNOSIS — N186 End stage renal disease: Secondary | ICD-10-CM | POA: Diagnosis not present

## 2017-04-01 DIAGNOSIS — Z23 Encounter for immunization: Secondary | ICD-10-CM | POA: Diagnosis not present

## 2017-04-01 DIAGNOSIS — E1129 Type 2 diabetes mellitus with other diabetic kidney complication: Secondary | ICD-10-CM | POA: Diagnosis not present

## 2017-04-01 DIAGNOSIS — D631 Anemia in chronic kidney disease: Secondary | ICD-10-CM | POA: Diagnosis not present

## 2017-04-01 DIAGNOSIS — N2581 Secondary hyperparathyroidism of renal origin: Secondary | ICD-10-CM | POA: Diagnosis not present

## 2017-04-01 DIAGNOSIS — D689 Coagulation defect, unspecified: Secondary | ICD-10-CM | POA: Diagnosis not present

## 2017-04-02 DIAGNOSIS — Z23 Encounter for immunization: Secondary | ICD-10-CM | POA: Diagnosis not present

## 2017-04-02 DIAGNOSIS — N2581 Secondary hyperparathyroidism of renal origin: Secondary | ICD-10-CM | POA: Diagnosis not present

## 2017-04-02 DIAGNOSIS — Z7689 Persons encountering health services in other specified circumstances: Secondary | ICD-10-CM | POA: Diagnosis not present

## 2017-04-02 DIAGNOSIS — E1129 Type 2 diabetes mellitus with other diabetic kidney complication: Secondary | ICD-10-CM | POA: Diagnosis not present

## 2017-04-02 DIAGNOSIS — N186 End stage renal disease: Secondary | ICD-10-CM | POA: Diagnosis not present

## 2017-04-02 DIAGNOSIS — D631 Anemia in chronic kidney disease: Secondary | ICD-10-CM | POA: Diagnosis not present

## 2017-04-02 DIAGNOSIS — D689 Coagulation defect, unspecified: Secondary | ICD-10-CM | POA: Diagnosis not present

## 2017-04-02 DIAGNOSIS — I10 Essential (primary) hypertension: Secondary | ICD-10-CM | POA: Diagnosis not present

## 2017-04-04 DIAGNOSIS — D631 Anemia in chronic kidney disease: Secondary | ICD-10-CM | POA: Diagnosis not present

## 2017-04-04 DIAGNOSIS — E1129 Type 2 diabetes mellitus with other diabetic kidney complication: Secondary | ICD-10-CM | POA: Diagnosis not present

## 2017-04-04 DIAGNOSIS — Z23 Encounter for immunization: Secondary | ICD-10-CM | POA: Diagnosis not present

## 2017-04-04 DIAGNOSIS — N2581 Secondary hyperparathyroidism of renal origin: Secondary | ICD-10-CM | POA: Diagnosis not present

## 2017-04-04 DIAGNOSIS — D689 Coagulation defect, unspecified: Secondary | ICD-10-CM | POA: Diagnosis not present

## 2017-04-04 DIAGNOSIS — N186 End stage renal disease: Secondary | ICD-10-CM | POA: Diagnosis not present

## 2017-04-04 DIAGNOSIS — Z7689 Persons encountering health services in other specified circumstances: Secondary | ICD-10-CM | POA: Diagnosis not present

## 2017-04-06 DIAGNOSIS — E1129 Type 2 diabetes mellitus with other diabetic kidney complication: Secondary | ICD-10-CM | POA: Diagnosis not present

## 2017-04-06 DIAGNOSIS — Z7689 Persons encountering health services in other specified circumstances: Secondary | ICD-10-CM | POA: Diagnosis not present

## 2017-04-06 DIAGNOSIS — N186 End stage renal disease: Secondary | ICD-10-CM | POA: Diagnosis not present

## 2017-04-06 DIAGNOSIS — D689 Coagulation defect, unspecified: Secondary | ICD-10-CM | POA: Diagnosis not present

## 2017-04-06 DIAGNOSIS — D631 Anemia in chronic kidney disease: Secondary | ICD-10-CM | POA: Diagnosis not present

## 2017-04-06 DIAGNOSIS — Z23 Encounter for immunization: Secondary | ICD-10-CM | POA: Diagnosis not present

## 2017-04-06 DIAGNOSIS — N2581 Secondary hyperparathyroidism of renal origin: Secondary | ICD-10-CM | POA: Diagnosis not present

## 2017-04-09 DIAGNOSIS — Z992 Dependence on renal dialysis: Secondary | ICD-10-CM | POA: Diagnosis not present

## 2017-04-09 DIAGNOSIS — N2581 Secondary hyperparathyroidism of renal origin: Secondary | ICD-10-CM | POA: Diagnosis not present

## 2017-04-09 DIAGNOSIS — N186 End stage renal disease: Secondary | ICD-10-CM | POA: Diagnosis not present

## 2017-04-09 DIAGNOSIS — Z7689 Persons encountering health services in other specified circumstances: Secondary | ICD-10-CM | POA: Diagnosis not present

## 2017-04-09 DIAGNOSIS — D631 Anemia in chronic kidney disease: Secondary | ICD-10-CM | POA: Diagnosis not present

## 2017-04-09 DIAGNOSIS — Z23 Encounter for immunization: Secondary | ICD-10-CM | POA: Diagnosis not present

## 2017-04-09 DIAGNOSIS — E1129 Type 2 diabetes mellitus with other diabetic kidney complication: Secondary | ICD-10-CM | POA: Diagnosis not present

## 2017-04-09 DIAGNOSIS — E1122 Type 2 diabetes mellitus with diabetic chronic kidney disease: Secondary | ICD-10-CM | POA: Diagnosis not present

## 2017-04-09 DIAGNOSIS — D689 Coagulation defect, unspecified: Secondary | ICD-10-CM | POA: Diagnosis not present

## 2017-04-11 DIAGNOSIS — D509 Iron deficiency anemia, unspecified: Secondary | ICD-10-CM | POA: Diagnosis not present

## 2017-04-11 DIAGNOSIS — E1129 Type 2 diabetes mellitus with other diabetic kidney complication: Secondary | ICD-10-CM | POA: Diagnosis not present

## 2017-04-11 DIAGNOSIS — N2581 Secondary hyperparathyroidism of renal origin: Secondary | ICD-10-CM | POA: Diagnosis not present

## 2017-04-11 DIAGNOSIS — D689 Coagulation defect, unspecified: Secondary | ICD-10-CM | POA: Diagnosis not present

## 2017-04-11 DIAGNOSIS — D631 Anemia in chronic kidney disease: Secondary | ICD-10-CM | POA: Diagnosis not present

## 2017-04-11 DIAGNOSIS — Z7689 Persons encountering health services in other specified circumstances: Secondary | ICD-10-CM | POA: Diagnosis not present

## 2017-04-11 DIAGNOSIS — N186 End stage renal disease: Secondary | ICD-10-CM | POA: Diagnosis not present

## 2017-04-13 DIAGNOSIS — Z7689 Persons encountering health services in other specified circumstances: Secondary | ICD-10-CM | POA: Diagnosis not present

## 2017-04-13 DIAGNOSIS — D689 Coagulation defect, unspecified: Secondary | ICD-10-CM | POA: Diagnosis not present

## 2017-04-13 DIAGNOSIS — D631 Anemia in chronic kidney disease: Secondary | ICD-10-CM | POA: Diagnosis not present

## 2017-04-13 DIAGNOSIS — D509 Iron deficiency anemia, unspecified: Secondary | ICD-10-CM | POA: Diagnosis not present

## 2017-04-13 DIAGNOSIS — E1129 Type 2 diabetes mellitus with other diabetic kidney complication: Secondary | ICD-10-CM | POA: Diagnosis not present

## 2017-04-13 DIAGNOSIS — N2581 Secondary hyperparathyroidism of renal origin: Secondary | ICD-10-CM | POA: Diagnosis not present

## 2017-04-13 DIAGNOSIS — N186 End stage renal disease: Secondary | ICD-10-CM | POA: Diagnosis not present

## 2017-04-16 DIAGNOSIS — Z7689 Persons encountering health services in other specified circumstances: Secondary | ICD-10-CM | POA: Diagnosis not present

## 2017-04-16 DIAGNOSIS — N2581 Secondary hyperparathyroidism of renal origin: Secondary | ICD-10-CM | POA: Diagnosis not present

## 2017-04-16 DIAGNOSIS — N186 End stage renal disease: Secondary | ICD-10-CM | POA: Diagnosis not present

## 2017-04-16 DIAGNOSIS — D689 Coagulation defect, unspecified: Secondary | ICD-10-CM | POA: Diagnosis not present

## 2017-04-16 DIAGNOSIS — D631 Anemia in chronic kidney disease: Secondary | ICD-10-CM | POA: Diagnosis not present

## 2017-04-16 DIAGNOSIS — D509 Iron deficiency anemia, unspecified: Secondary | ICD-10-CM | POA: Diagnosis not present

## 2017-04-16 DIAGNOSIS — E1129 Type 2 diabetes mellitus with other diabetic kidney complication: Secondary | ICD-10-CM | POA: Diagnosis not present

## 2017-04-18 DIAGNOSIS — D631 Anemia in chronic kidney disease: Secondary | ICD-10-CM | POA: Diagnosis not present

## 2017-04-18 DIAGNOSIS — N2581 Secondary hyperparathyroidism of renal origin: Secondary | ICD-10-CM | POA: Diagnosis not present

## 2017-04-18 DIAGNOSIS — D509 Iron deficiency anemia, unspecified: Secondary | ICD-10-CM | POA: Diagnosis not present

## 2017-04-18 DIAGNOSIS — E1129 Type 2 diabetes mellitus with other diabetic kidney complication: Secondary | ICD-10-CM | POA: Diagnosis not present

## 2017-04-18 DIAGNOSIS — Z7689 Persons encountering health services in other specified circumstances: Secondary | ICD-10-CM | POA: Diagnosis not present

## 2017-04-18 DIAGNOSIS — D689 Coagulation defect, unspecified: Secondary | ICD-10-CM | POA: Diagnosis not present

## 2017-04-18 DIAGNOSIS — N186 End stage renal disease: Secondary | ICD-10-CM | POA: Diagnosis not present

## 2017-04-19 IMAGING — CT CT ABD-PELV W/O CM
2 of 7 series · 14 of 46 positions shown, 18 images · non-contrast
Comparison: None.

CLINICAL DATA: Abdominal pain and vomiting. Onset previous evening.
Chronic renal disease.

EXAM:
CT ABDOMEN AND PELVIS WITHOUT CONTRAST
TECHNIQUE: Multidetector CT imaging of the abdomen and pelvis was performed
following the standard protocol without IV contrast.

[Series 2: abdomen/pelvis w/o contrast · axial · non-contrast · 0.83mm/px · z∈[-542,-167]mm · 11 of 85 slices shown, 15 images]
[im 5/85  soft-tissue]
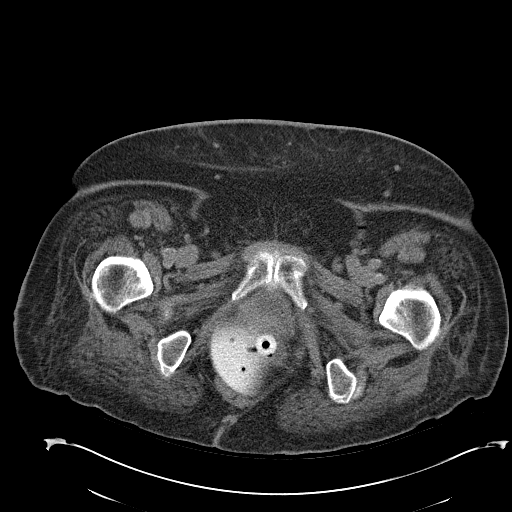
[im 5/85  bone]
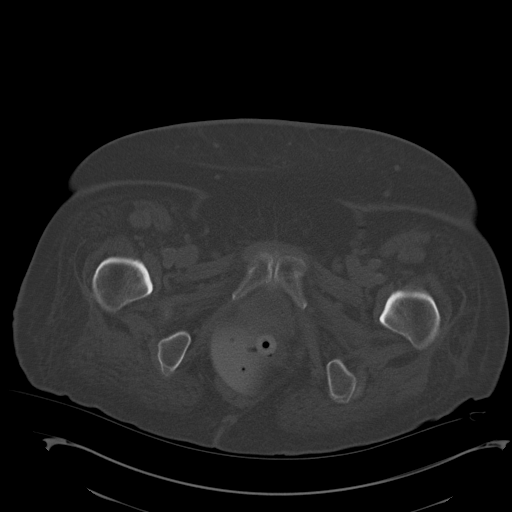
[im 15/85  soft-tissue]
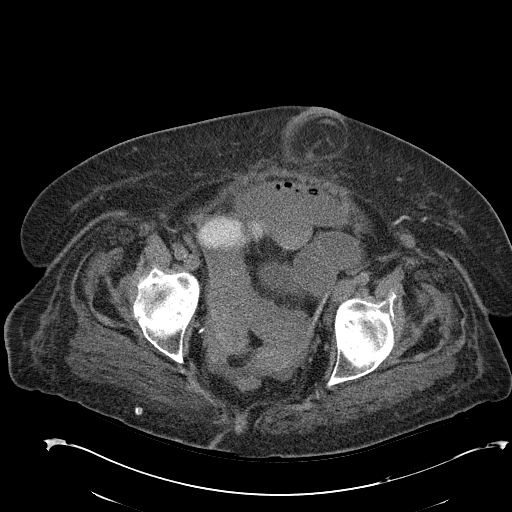
[im 24/85  soft-tissue]
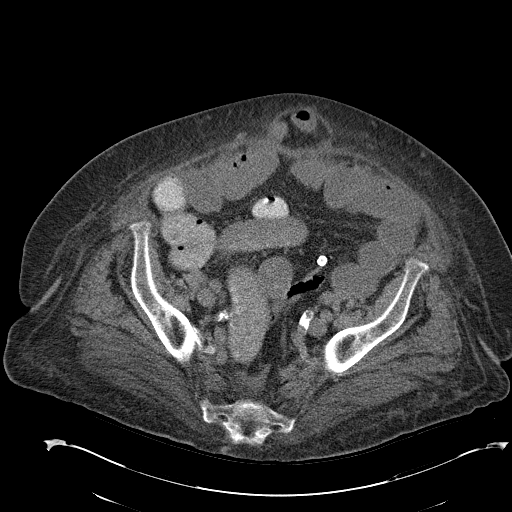
[im 33/85  soft-tissue]
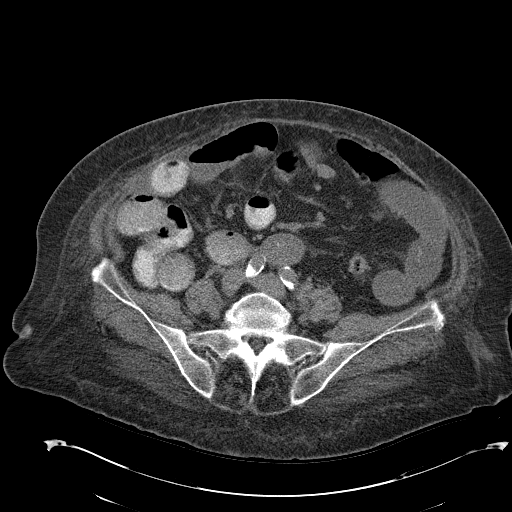
[im 43/85  soft-tissue]
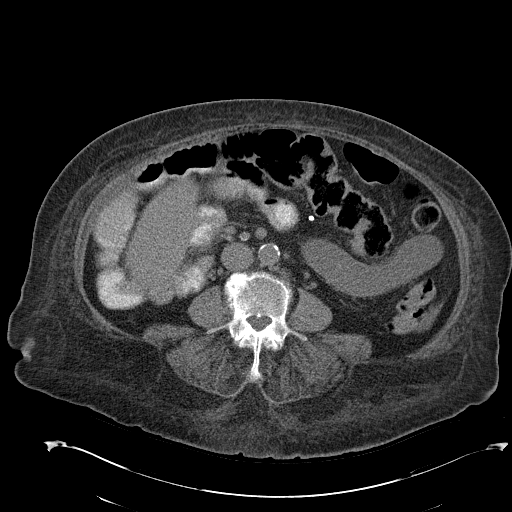
[im 52/85  soft-tissue]
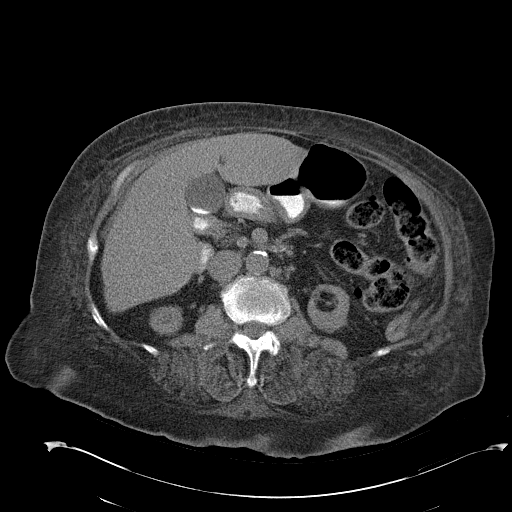
[im 61/85  soft-tissue]
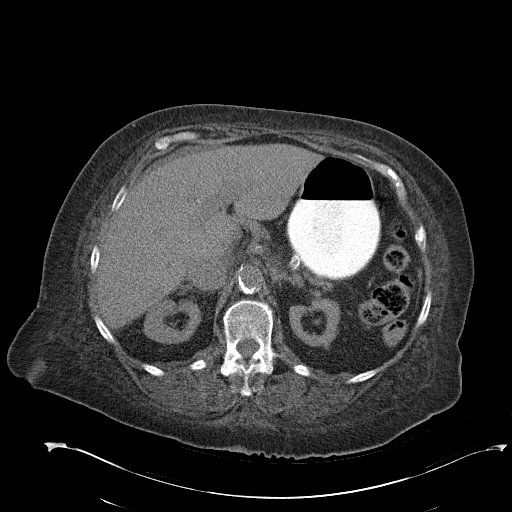
[im 66/85  lung]
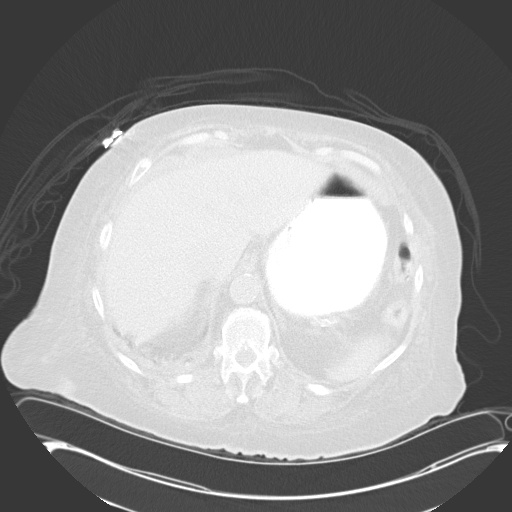
[im 71/85  soft-tissue]
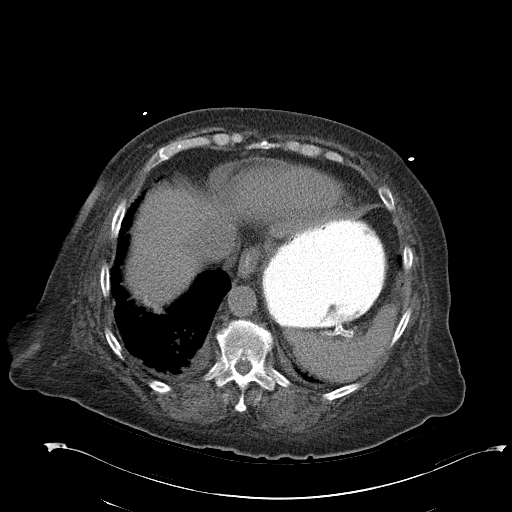
[im 71/85  lung]
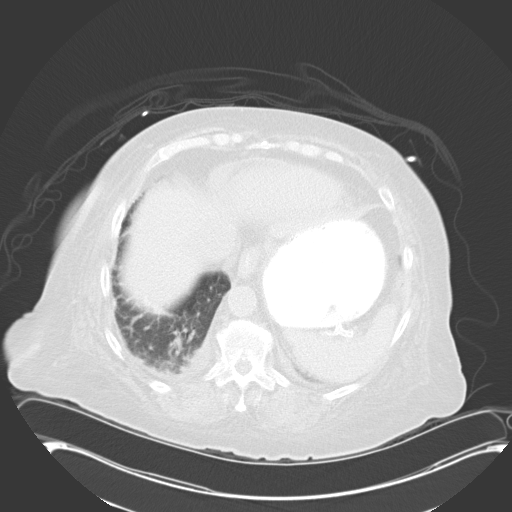
[im 75/85  lung]
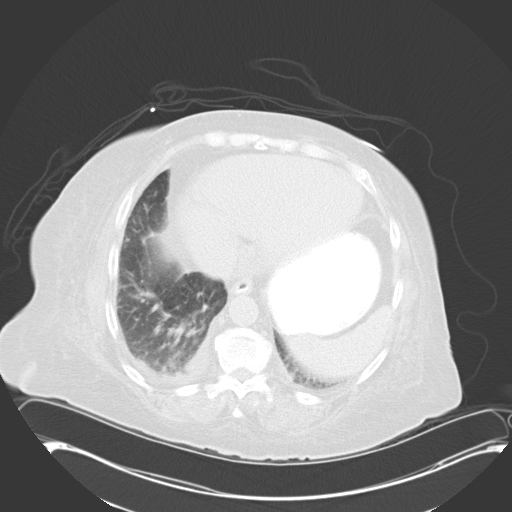
[im 80/85  soft-tissue]
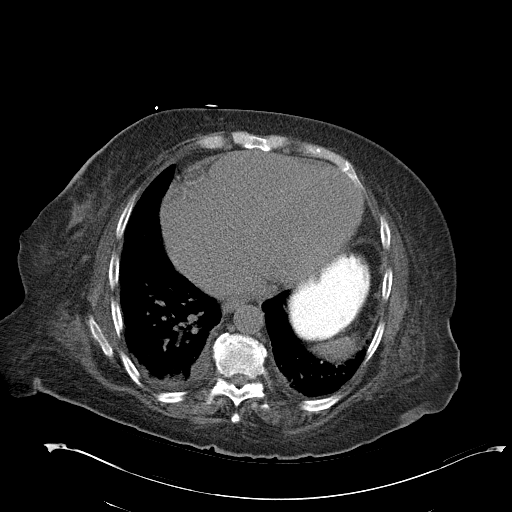
[im 80/85  lung]
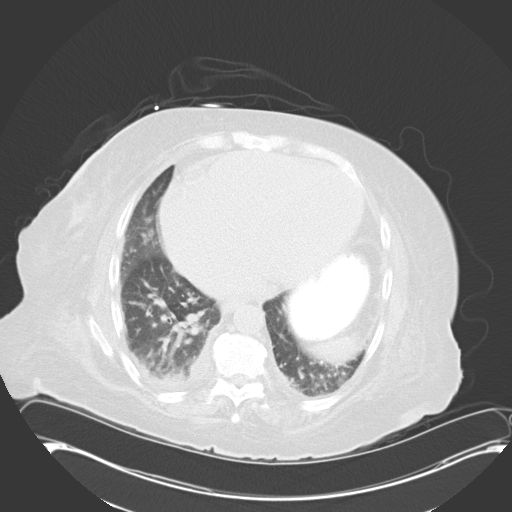
[im 80/85  bone]
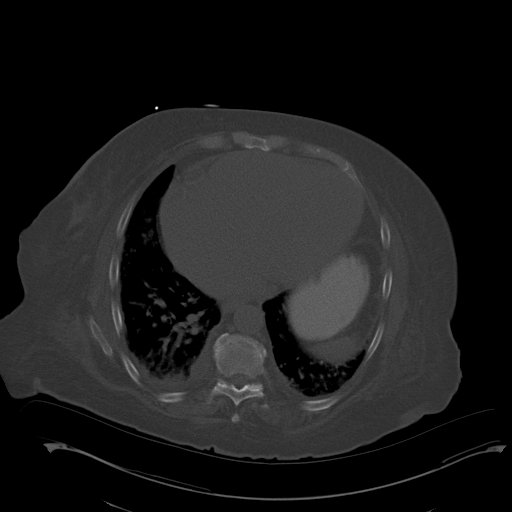

[Series 3: mpr cor (id) · coronal · 0.83mm/px · 3 of 102 slices shown]
[im 26/102  soft-tissue]
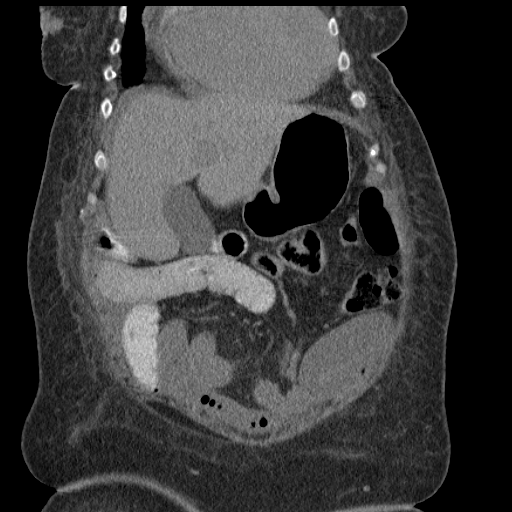
[im 51/102  soft-tissue]
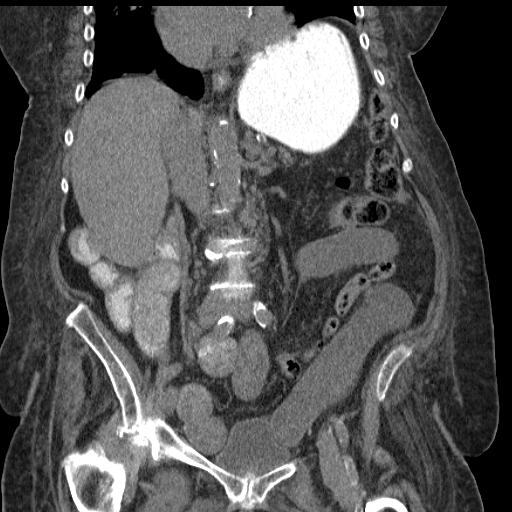
[im 76/102  soft-tissue]
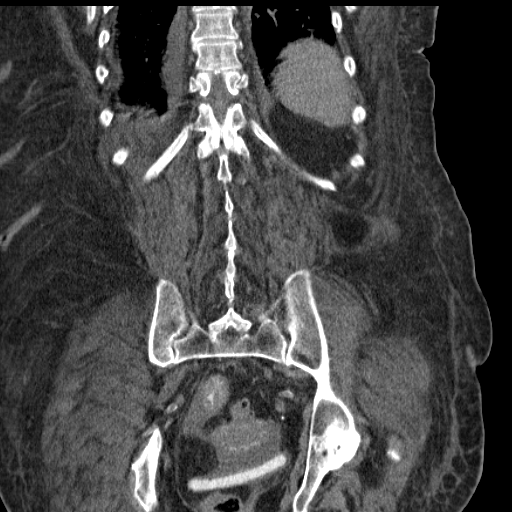

[14 of 46 positions shown; findings below may reference images not displayed]

FINDINGS: Lower chest: Mild basilar atelectasis. Heart is enlarged. No
pericardial fluid

Hepatobiliary: No focal hepatic lesions noncontrast exam. Narrowing
stones or sludge within the gallbladder. No gallbladder thickening
or inflammation.

Pancreas: Pancreas is normal. No ductal dilatation. No pancreatic
inflammation.

Spleen: Normal spleen

Adrenals/urinary tract: Adrenal glands are normal. Kidneys are
atrophic. No ureterolithiasis. Bladder is normal.

Stomach/Bowel: Stomach is normal. Oral contrast progresses to the
mid small bowel. The proximal small bowel is fluid-filled measuring
up to 3.2 cm. Theduodenum does not cross midline with the near
entirety of the small bowel in the RIGHT abdomen. Findings are
consistent with small bowel malrotation. The cecum is in the LEFT
abdomen. Small bowel leading up to the cecum is decompressed (Image
56, series 2.

There is a ventral midline abdominal hernia measuring 6.5 cm (image
67, series 2). This hernia has a narrow mouth measuring 2 cm on
image 60, series 2. A loop of small bowel enters the hernia sac.
There is a caliber change from the entering and exiting small bowel
consistent with small bowel obstruction at the hernia site (best
seen on sagittal image 70 through 76).

Vascular/Lymphatic: Abdominal aorta is normal caliber with
atherosclerotic calcification. There is no retroperitoneal or
periportal lymphadenopathy. No pelvic lymphadenopathy.

Reproductive: There is a pessary device within the vagina. Post
hysterectomy anatomy. No adnexal abnormality.

Other: No free fluid.

Musculoskeletal: No acute findings of the skeleton. Degenerate
changes of the lumbar spine.
IMPRESSION: 1. Small bowel obstruction with obstruction occurring in a ventral
abdominal hernia which contains a short segment of small bowel.
2. Small bowel malrotation anatomy which does not appear to
contribute to the bowel obstruction.
3. No intraperitoneal free air or portal venous gas.

## 2017-04-20 DIAGNOSIS — E1129 Type 2 diabetes mellitus with other diabetic kidney complication: Secondary | ICD-10-CM | POA: Diagnosis not present

## 2017-04-20 DIAGNOSIS — N2581 Secondary hyperparathyroidism of renal origin: Secondary | ICD-10-CM | POA: Diagnosis not present

## 2017-04-20 DIAGNOSIS — N186 End stage renal disease: Secondary | ICD-10-CM | POA: Diagnosis not present

## 2017-04-20 DIAGNOSIS — D509 Iron deficiency anemia, unspecified: Secondary | ICD-10-CM | POA: Diagnosis not present

## 2017-04-20 DIAGNOSIS — D689 Coagulation defect, unspecified: Secondary | ICD-10-CM | POA: Diagnosis not present

## 2017-04-20 DIAGNOSIS — D631 Anemia in chronic kidney disease: Secondary | ICD-10-CM | POA: Diagnosis not present

## 2017-04-23 DIAGNOSIS — D509 Iron deficiency anemia, unspecified: Secondary | ICD-10-CM | POA: Diagnosis not present

## 2017-04-23 DIAGNOSIS — E1129 Type 2 diabetes mellitus with other diabetic kidney complication: Secondary | ICD-10-CM | POA: Diagnosis not present

## 2017-04-23 DIAGNOSIS — Z7689 Persons encountering health services in other specified circumstances: Secondary | ICD-10-CM | POA: Diagnosis not present

## 2017-04-23 DIAGNOSIS — N186 End stage renal disease: Secondary | ICD-10-CM | POA: Diagnosis not present

## 2017-04-23 DIAGNOSIS — D689 Coagulation defect, unspecified: Secondary | ICD-10-CM | POA: Diagnosis not present

## 2017-04-23 DIAGNOSIS — D631 Anemia in chronic kidney disease: Secondary | ICD-10-CM | POA: Diagnosis not present

## 2017-04-23 DIAGNOSIS — N2581 Secondary hyperparathyroidism of renal origin: Secondary | ICD-10-CM | POA: Diagnosis not present

## 2017-04-25 DIAGNOSIS — N2581 Secondary hyperparathyroidism of renal origin: Secondary | ICD-10-CM | POA: Diagnosis not present

## 2017-04-25 DIAGNOSIS — E1129 Type 2 diabetes mellitus with other diabetic kidney complication: Secondary | ICD-10-CM | POA: Diagnosis not present

## 2017-04-25 DIAGNOSIS — Z7689 Persons encountering health services in other specified circumstances: Secondary | ICD-10-CM | POA: Diagnosis not present

## 2017-04-25 DIAGNOSIS — D631 Anemia in chronic kidney disease: Secondary | ICD-10-CM | POA: Diagnosis not present

## 2017-04-25 DIAGNOSIS — D509 Iron deficiency anemia, unspecified: Secondary | ICD-10-CM | POA: Diagnosis not present

## 2017-04-25 DIAGNOSIS — N186 End stage renal disease: Secondary | ICD-10-CM | POA: Diagnosis not present

## 2017-04-25 DIAGNOSIS — D689 Coagulation defect, unspecified: Secondary | ICD-10-CM | POA: Diagnosis not present

## 2017-04-26 DIAGNOSIS — N2581 Secondary hyperparathyroidism of renal origin: Secondary | ICD-10-CM | POA: Diagnosis not present

## 2017-04-26 DIAGNOSIS — E1129 Type 2 diabetes mellitus with other diabetic kidney complication: Secondary | ICD-10-CM | POA: Diagnosis not present

## 2017-04-26 DIAGNOSIS — D509 Iron deficiency anemia, unspecified: Secondary | ICD-10-CM | POA: Diagnosis not present

## 2017-04-26 DIAGNOSIS — N186 End stage renal disease: Secondary | ICD-10-CM | POA: Diagnosis not present

## 2017-04-26 DIAGNOSIS — D631 Anemia in chronic kidney disease: Secondary | ICD-10-CM | POA: Diagnosis not present

## 2017-04-26 DIAGNOSIS — D689 Coagulation defect, unspecified: Secondary | ICD-10-CM | POA: Diagnosis not present

## 2017-04-29 DIAGNOSIS — D689 Coagulation defect, unspecified: Secondary | ICD-10-CM | POA: Diagnosis not present

## 2017-04-29 DIAGNOSIS — D509 Iron deficiency anemia, unspecified: Secondary | ICD-10-CM | POA: Diagnosis not present

## 2017-04-29 DIAGNOSIS — N186 End stage renal disease: Secondary | ICD-10-CM | POA: Diagnosis not present

## 2017-04-29 DIAGNOSIS — E1129 Type 2 diabetes mellitus with other diabetic kidney complication: Secondary | ICD-10-CM | POA: Diagnosis not present

## 2017-04-29 DIAGNOSIS — N2581 Secondary hyperparathyroidism of renal origin: Secondary | ICD-10-CM | POA: Diagnosis not present

## 2017-04-29 DIAGNOSIS — D631 Anemia in chronic kidney disease: Secondary | ICD-10-CM | POA: Diagnosis not present

## 2017-04-30 DIAGNOSIS — N186 End stage renal disease: Secondary | ICD-10-CM | POA: Diagnosis not present

## 2017-04-30 DIAGNOSIS — D689 Coagulation defect, unspecified: Secondary | ICD-10-CM | POA: Diagnosis not present

## 2017-04-30 DIAGNOSIS — D631 Anemia in chronic kidney disease: Secondary | ICD-10-CM | POA: Diagnosis not present

## 2017-04-30 DIAGNOSIS — Z7689 Persons encountering health services in other specified circumstances: Secondary | ICD-10-CM | POA: Diagnosis not present

## 2017-04-30 DIAGNOSIS — E1129 Type 2 diabetes mellitus with other diabetic kidney complication: Secondary | ICD-10-CM | POA: Diagnosis not present

## 2017-04-30 DIAGNOSIS — D509 Iron deficiency anemia, unspecified: Secondary | ICD-10-CM | POA: Diagnosis not present

## 2017-04-30 DIAGNOSIS — N2581 Secondary hyperparathyroidism of renal origin: Secondary | ICD-10-CM | POA: Diagnosis not present

## 2017-05-02 DIAGNOSIS — N2581 Secondary hyperparathyroidism of renal origin: Secondary | ICD-10-CM | POA: Diagnosis not present

## 2017-05-02 DIAGNOSIS — Z7689 Persons encountering health services in other specified circumstances: Secondary | ICD-10-CM | POA: Diagnosis not present

## 2017-05-02 DIAGNOSIS — N186 End stage renal disease: Secondary | ICD-10-CM | POA: Diagnosis not present

## 2017-05-02 DIAGNOSIS — E1129 Type 2 diabetes mellitus with other diabetic kidney complication: Secondary | ICD-10-CM | POA: Diagnosis not present

## 2017-05-02 DIAGNOSIS — D689 Coagulation defect, unspecified: Secondary | ICD-10-CM | POA: Diagnosis not present

## 2017-05-02 DIAGNOSIS — D631 Anemia in chronic kidney disease: Secondary | ICD-10-CM | POA: Diagnosis not present

## 2017-05-02 DIAGNOSIS — D509 Iron deficiency anemia, unspecified: Secondary | ICD-10-CM | POA: Diagnosis not present

## 2017-05-03 DIAGNOSIS — Z1389 Encounter for screening for other disorder: Secondary | ICD-10-CM | POA: Diagnosis not present

## 2017-05-03 DIAGNOSIS — L84 Corns and callosities: Secondary | ICD-10-CM | POA: Diagnosis not present

## 2017-05-03 DIAGNOSIS — E1129 Type 2 diabetes mellitus with other diabetic kidney complication: Secondary | ICD-10-CM | POA: Diagnosis not present

## 2017-05-03 DIAGNOSIS — Z992 Dependence on renal dialysis: Secondary | ICD-10-CM | POA: Diagnosis not present

## 2017-05-03 DIAGNOSIS — R201 Hypoesthesia of skin: Secondary | ICD-10-CM | POA: Diagnosis not present

## 2017-05-03 DIAGNOSIS — Z7689 Persons encountering health services in other specified circumstances: Secondary | ICD-10-CM | POA: Diagnosis not present

## 2017-05-03 DIAGNOSIS — I10 Essential (primary) hypertension: Secondary | ICD-10-CM | POA: Diagnosis not present

## 2017-05-03 DIAGNOSIS — I350 Nonrheumatic aortic (valve) stenosis: Secondary | ICD-10-CM | POA: Diagnosis not present

## 2017-05-03 DIAGNOSIS — E039 Hypothyroidism, unspecified: Secondary | ICD-10-CM | POA: Diagnosis not present

## 2017-05-04 DIAGNOSIS — E1129 Type 2 diabetes mellitus with other diabetic kidney complication: Secondary | ICD-10-CM | POA: Diagnosis not present

## 2017-05-04 DIAGNOSIS — D509 Iron deficiency anemia, unspecified: Secondary | ICD-10-CM | POA: Diagnosis not present

## 2017-05-04 DIAGNOSIS — N186 End stage renal disease: Secondary | ICD-10-CM | POA: Diagnosis not present

## 2017-05-04 DIAGNOSIS — D689 Coagulation defect, unspecified: Secondary | ICD-10-CM | POA: Diagnosis not present

## 2017-05-04 DIAGNOSIS — D631 Anemia in chronic kidney disease: Secondary | ICD-10-CM | POA: Diagnosis not present

## 2017-05-04 DIAGNOSIS — Z7689 Persons encountering health services in other specified circumstances: Secondary | ICD-10-CM | POA: Diagnosis not present

## 2017-05-04 DIAGNOSIS — N2581 Secondary hyperparathyroidism of renal origin: Secondary | ICD-10-CM | POA: Diagnosis not present

## 2017-05-07 DIAGNOSIS — D509 Iron deficiency anemia, unspecified: Secondary | ICD-10-CM | POA: Diagnosis not present

## 2017-05-07 DIAGNOSIS — D631 Anemia in chronic kidney disease: Secondary | ICD-10-CM | POA: Diagnosis not present

## 2017-05-07 DIAGNOSIS — E1129 Type 2 diabetes mellitus with other diabetic kidney complication: Secondary | ICD-10-CM | POA: Diagnosis not present

## 2017-05-07 DIAGNOSIS — D689 Coagulation defect, unspecified: Secondary | ICD-10-CM | POA: Diagnosis not present

## 2017-05-07 DIAGNOSIS — N2581 Secondary hyperparathyroidism of renal origin: Secondary | ICD-10-CM | POA: Diagnosis not present

## 2017-05-07 DIAGNOSIS — Z7689 Persons encountering health services in other specified circumstances: Secondary | ICD-10-CM | POA: Diagnosis not present

## 2017-05-07 DIAGNOSIS — N186 End stage renal disease: Secondary | ICD-10-CM | POA: Diagnosis not present

## 2017-05-09 DIAGNOSIS — N186 End stage renal disease: Secondary | ICD-10-CM | POA: Diagnosis not present

## 2017-05-09 DIAGNOSIS — N2581 Secondary hyperparathyroidism of renal origin: Secondary | ICD-10-CM | POA: Diagnosis not present

## 2017-05-09 DIAGNOSIS — D509 Iron deficiency anemia, unspecified: Secondary | ICD-10-CM | POA: Diagnosis not present

## 2017-05-09 DIAGNOSIS — D689 Coagulation defect, unspecified: Secondary | ICD-10-CM | POA: Diagnosis not present

## 2017-05-09 DIAGNOSIS — D631 Anemia in chronic kidney disease: Secondary | ICD-10-CM | POA: Diagnosis not present

## 2017-05-09 DIAGNOSIS — E1129 Type 2 diabetes mellitus with other diabetic kidney complication: Secondary | ICD-10-CM | POA: Diagnosis not present

## 2017-05-09 DIAGNOSIS — Z7689 Persons encountering health services in other specified circumstances: Secondary | ICD-10-CM | POA: Diagnosis not present

## 2017-05-10 DIAGNOSIS — E1122 Type 2 diabetes mellitus with diabetic chronic kidney disease: Secondary | ICD-10-CM | POA: Diagnosis not present

## 2017-05-10 DIAGNOSIS — N186 End stage renal disease: Secondary | ICD-10-CM | POA: Diagnosis not present

## 2017-05-10 DIAGNOSIS — Z992 Dependence on renal dialysis: Secondary | ICD-10-CM | POA: Diagnosis not present

## 2017-05-11 DIAGNOSIS — Z23 Encounter for immunization: Secondary | ICD-10-CM | POA: Diagnosis not present

## 2017-05-11 DIAGNOSIS — N186 End stage renal disease: Secondary | ICD-10-CM | POA: Diagnosis not present

## 2017-05-11 DIAGNOSIS — Z7689 Persons encountering health services in other specified circumstances: Secondary | ICD-10-CM | POA: Diagnosis not present

## 2017-05-11 DIAGNOSIS — N2581 Secondary hyperparathyroidism of renal origin: Secondary | ICD-10-CM | POA: Diagnosis not present

## 2017-05-11 DIAGNOSIS — E1129 Type 2 diabetes mellitus with other diabetic kidney complication: Secondary | ICD-10-CM | POA: Diagnosis not present

## 2017-05-11 DIAGNOSIS — D631 Anemia in chronic kidney disease: Secondary | ICD-10-CM | POA: Diagnosis not present

## 2017-05-11 DIAGNOSIS — D689 Coagulation defect, unspecified: Secondary | ICD-10-CM | POA: Diagnosis not present

## 2017-05-14 DIAGNOSIS — D631 Anemia in chronic kidney disease: Secondary | ICD-10-CM | POA: Diagnosis not present

## 2017-05-14 DIAGNOSIS — D689 Coagulation defect, unspecified: Secondary | ICD-10-CM | POA: Diagnosis not present

## 2017-05-14 DIAGNOSIS — Z23 Encounter for immunization: Secondary | ICD-10-CM | POA: Diagnosis not present

## 2017-05-14 DIAGNOSIS — N186 End stage renal disease: Secondary | ICD-10-CM | POA: Diagnosis not present

## 2017-05-14 DIAGNOSIS — N2581 Secondary hyperparathyroidism of renal origin: Secondary | ICD-10-CM | POA: Diagnosis not present

## 2017-05-14 DIAGNOSIS — Z7689 Persons encountering health services in other specified circumstances: Secondary | ICD-10-CM | POA: Diagnosis not present

## 2017-05-14 DIAGNOSIS — E1129 Type 2 diabetes mellitus with other diabetic kidney complication: Secondary | ICD-10-CM | POA: Diagnosis not present

## 2017-05-16 DIAGNOSIS — N2581 Secondary hyperparathyroidism of renal origin: Secondary | ICD-10-CM | POA: Diagnosis not present

## 2017-05-16 DIAGNOSIS — D689 Coagulation defect, unspecified: Secondary | ICD-10-CM | POA: Diagnosis not present

## 2017-05-16 DIAGNOSIS — Z7689 Persons encountering health services in other specified circumstances: Secondary | ICD-10-CM | POA: Diagnosis not present

## 2017-05-16 DIAGNOSIS — E1129 Type 2 diabetes mellitus with other diabetic kidney complication: Secondary | ICD-10-CM | POA: Diagnosis not present

## 2017-05-16 DIAGNOSIS — Z23 Encounter for immunization: Secondary | ICD-10-CM | POA: Diagnosis not present

## 2017-05-16 DIAGNOSIS — D631 Anemia in chronic kidney disease: Secondary | ICD-10-CM | POA: Diagnosis not present

## 2017-05-16 DIAGNOSIS — N186 End stage renal disease: Secondary | ICD-10-CM | POA: Diagnosis not present

## 2017-05-18 DIAGNOSIS — E1129 Type 2 diabetes mellitus with other diabetic kidney complication: Secondary | ICD-10-CM | POA: Diagnosis not present

## 2017-05-18 DIAGNOSIS — D689 Coagulation defect, unspecified: Secondary | ICD-10-CM | POA: Diagnosis not present

## 2017-05-18 DIAGNOSIS — Z7689 Persons encountering health services in other specified circumstances: Secondary | ICD-10-CM | POA: Diagnosis not present

## 2017-05-18 DIAGNOSIS — D631 Anemia in chronic kidney disease: Secondary | ICD-10-CM | POA: Diagnosis not present

## 2017-05-18 DIAGNOSIS — Z23 Encounter for immunization: Secondary | ICD-10-CM | POA: Diagnosis not present

## 2017-05-18 DIAGNOSIS — N2581 Secondary hyperparathyroidism of renal origin: Secondary | ICD-10-CM | POA: Diagnosis not present

## 2017-05-18 DIAGNOSIS — N186 End stage renal disease: Secondary | ICD-10-CM | POA: Diagnosis not present

## 2017-05-20 ENCOUNTER — Encounter: Payer: Self-pay | Admitting: Obstetrics & Gynecology

## 2017-05-20 ENCOUNTER — Ambulatory Visit (INDEPENDENT_AMBULATORY_CARE_PROVIDER_SITE_OTHER): Payer: Medicare Other | Admitting: Obstetrics & Gynecology

## 2017-05-20 VITALS — BP 136/74 | HR 88 | Ht 64.0 in | Wt 230.0 lb

## 2017-05-20 DIAGNOSIS — Z7689 Persons encountering health services in other specified circumstances: Secondary | ICD-10-CM | POA: Diagnosis not present

## 2017-05-20 DIAGNOSIS — N813 Complete uterovaginal prolapse: Secondary | ICD-10-CM

## 2017-05-20 DIAGNOSIS — Z4689 Encounter for fitting and adjustment of other specified devices: Secondary | ICD-10-CM | POA: Diagnosis not present

## 2017-05-20 NOTE — Progress Notes (Signed)
Chief Complaint  Patient presents with  . Follow-up    pessary   Blood pressure 136/74, pulse 88, height 5\' 4"  (1.626 m), weight 230 lb (104.3 kg).  [name] presents today for routine follow-up related to her pessary  She is without complaints today she denies any bleeding or discharge We had to change her pessary from a Gellhorn to a Milex ring with support #5 and that is doing well  Exam reveals no undue vaginal mucosal breakdown no discharge and no vaginal bleeding  The pessary is removed and cleaned and then replaced without difficulty with good support  [name] was seen be seen back in 4 months for continued pessary maintenance follow-up  , MD 05/20/2017 2:28 PM

## 2017-05-21 DIAGNOSIS — N2581 Secondary hyperparathyroidism of renal origin: Secondary | ICD-10-CM | POA: Diagnosis not present

## 2017-05-21 DIAGNOSIS — D631 Anemia in chronic kidney disease: Secondary | ICD-10-CM | POA: Diagnosis not present

## 2017-05-21 DIAGNOSIS — D689 Coagulation defect, unspecified: Secondary | ICD-10-CM | POA: Diagnosis not present

## 2017-05-21 DIAGNOSIS — N186 End stage renal disease: Secondary | ICD-10-CM | POA: Diagnosis not present

## 2017-05-21 DIAGNOSIS — Z23 Encounter for immunization: Secondary | ICD-10-CM | POA: Diagnosis not present

## 2017-05-21 DIAGNOSIS — E1129 Type 2 diabetes mellitus with other diabetic kidney complication: Secondary | ICD-10-CM | POA: Diagnosis not present

## 2017-05-21 DIAGNOSIS — Z7689 Persons encountering health services in other specified circumstances: Secondary | ICD-10-CM | POA: Diagnosis not present

## 2017-05-23 DIAGNOSIS — D689 Coagulation defect, unspecified: Secondary | ICD-10-CM | POA: Diagnosis not present

## 2017-05-23 DIAGNOSIS — N186 End stage renal disease: Secondary | ICD-10-CM | POA: Diagnosis not present

## 2017-05-23 DIAGNOSIS — N2581 Secondary hyperparathyroidism of renal origin: Secondary | ICD-10-CM | POA: Diagnosis not present

## 2017-05-23 DIAGNOSIS — Z7689 Persons encountering health services in other specified circumstances: Secondary | ICD-10-CM | POA: Diagnosis not present

## 2017-05-23 DIAGNOSIS — Z23 Encounter for immunization: Secondary | ICD-10-CM | POA: Diagnosis not present

## 2017-05-23 DIAGNOSIS — E1129 Type 2 diabetes mellitus with other diabetic kidney complication: Secondary | ICD-10-CM | POA: Diagnosis not present

## 2017-05-23 DIAGNOSIS — D631 Anemia in chronic kidney disease: Secondary | ICD-10-CM | POA: Diagnosis not present

## 2017-05-25 DIAGNOSIS — D631 Anemia in chronic kidney disease: Secondary | ICD-10-CM | POA: Diagnosis not present

## 2017-05-25 DIAGNOSIS — Z23 Encounter for immunization: Secondary | ICD-10-CM | POA: Diagnosis not present

## 2017-05-25 DIAGNOSIS — N186 End stage renal disease: Secondary | ICD-10-CM | POA: Diagnosis not present

## 2017-05-25 DIAGNOSIS — D689 Coagulation defect, unspecified: Secondary | ICD-10-CM | POA: Diagnosis not present

## 2017-05-25 DIAGNOSIS — N2581 Secondary hyperparathyroidism of renal origin: Secondary | ICD-10-CM | POA: Diagnosis not present

## 2017-05-25 DIAGNOSIS — E1129 Type 2 diabetes mellitus with other diabetic kidney complication: Secondary | ICD-10-CM | POA: Diagnosis not present

## 2017-05-28 DIAGNOSIS — N2581 Secondary hyperparathyroidism of renal origin: Secondary | ICD-10-CM | POA: Diagnosis not present

## 2017-05-28 DIAGNOSIS — E1129 Type 2 diabetes mellitus with other diabetic kidney complication: Secondary | ICD-10-CM | POA: Diagnosis not present

## 2017-05-28 DIAGNOSIS — D689 Coagulation defect, unspecified: Secondary | ICD-10-CM | POA: Diagnosis not present

## 2017-05-28 DIAGNOSIS — D631 Anemia in chronic kidney disease: Secondary | ICD-10-CM | POA: Diagnosis not present

## 2017-05-28 DIAGNOSIS — Z23 Encounter for immunization: Secondary | ICD-10-CM | POA: Diagnosis not present

## 2017-05-28 DIAGNOSIS — Z7689 Persons encountering health services in other specified circumstances: Secondary | ICD-10-CM | POA: Diagnosis not present

## 2017-05-28 DIAGNOSIS — N186 End stage renal disease: Secondary | ICD-10-CM | POA: Diagnosis not present

## 2017-05-30 DIAGNOSIS — Z23 Encounter for immunization: Secondary | ICD-10-CM | POA: Diagnosis not present

## 2017-05-30 DIAGNOSIS — D689 Coagulation defect, unspecified: Secondary | ICD-10-CM | POA: Diagnosis not present

## 2017-05-30 DIAGNOSIS — E1129 Type 2 diabetes mellitus with other diabetic kidney complication: Secondary | ICD-10-CM | POA: Diagnosis not present

## 2017-05-30 DIAGNOSIS — D631 Anemia in chronic kidney disease: Secondary | ICD-10-CM | POA: Diagnosis not present

## 2017-05-30 DIAGNOSIS — N186 End stage renal disease: Secondary | ICD-10-CM | POA: Diagnosis not present

## 2017-05-30 DIAGNOSIS — N2581 Secondary hyperparathyroidism of renal origin: Secondary | ICD-10-CM | POA: Diagnosis not present

## 2017-06-01 DIAGNOSIS — Z7689 Persons encountering health services in other specified circumstances: Secondary | ICD-10-CM | POA: Diagnosis not present

## 2017-06-01 DIAGNOSIS — Z23 Encounter for immunization: Secondary | ICD-10-CM | POA: Diagnosis not present

## 2017-06-01 DIAGNOSIS — E1129 Type 2 diabetes mellitus with other diabetic kidney complication: Secondary | ICD-10-CM | POA: Diagnosis not present

## 2017-06-01 DIAGNOSIS — N2581 Secondary hyperparathyroidism of renal origin: Secondary | ICD-10-CM | POA: Diagnosis not present

## 2017-06-01 DIAGNOSIS — D631 Anemia in chronic kidney disease: Secondary | ICD-10-CM | POA: Diagnosis not present

## 2017-06-01 DIAGNOSIS — D689 Coagulation defect, unspecified: Secondary | ICD-10-CM | POA: Diagnosis not present

## 2017-06-01 DIAGNOSIS — N186 End stage renal disease: Secondary | ICD-10-CM | POA: Diagnosis not present

## 2017-06-03 DIAGNOSIS — I10 Essential (primary) hypertension: Secondary | ICD-10-CM | POA: Diagnosis not present

## 2017-06-04 DIAGNOSIS — N2581 Secondary hyperparathyroidism of renal origin: Secondary | ICD-10-CM | POA: Diagnosis not present

## 2017-06-04 DIAGNOSIS — Z7689 Persons encountering health services in other specified circumstances: Secondary | ICD-10-CM | POA: Diagnosis not present

## 2017-06-04 DIAGNOSIS — E1129 Type 2 diabetes mellitus with other diabetic kidney complication: Secondary | ICD-10-CM | POA: Diagnosis not present

## 2017-06-04 DIAGNOSIS — D631 Anemia in chronic kidney disease: Secondary | ICD-10-CM | POA: Diagnosis not present

## 2017-06-04 DIAGNOSIS — D689 Coagulation defect, unspecified: Secondary | ICD-10-CM | POA: Diagnosis not present

## 2017-06-04 DIAGNOSIS — Z23 Encounter for immunization: Secondary | ICD-10-CM | POA: Diagnosis not present

## 2017-06-04 DIAGNOSIS — N186 End stage renal disease: Secondary | ICD-10-CM | POA: Diagnosis not present

## 2017-06-06 DIAGNOSIS — Z7689 Persons encountering health services in other specified circumstances: Secondary | ICD-10-CM | POA: Diagnosis not present

## 2017-06-06 DIAGNOSIS — E1129 Type 2 diabetes mellitus with other diabetic kidney complication: Secondary | ICD-10-CM | POA: Diagnosis not present

## 2017-06-06 DIAGNOSIS — D689 Coagulation defect, unspecified: Secondary | ICD-10-CM | POA: Diagnosis not present

## 2017-06-06 DIAGNOSIS — D631 Anemia in chronic kidney disease: Secondary | ICD-10-CM | POA: Diagnosis not present

## 2017-06-06 DIAGNOSIS — Z23 Encounter for immunization: Secondary | ICD-10-CM | POA: Diagnosis not present

## 2017-06-06 DIAGNOSIS — N186 End stage renal disease: Secondary | ICD-10-CM | POA: Diagnosis not present

## 2017-06-06 DIAGNOSIS — N2581 Secondary hyperparathyroidism of renal origin: Secondary | ICD-10-CM | POA: Diagnosis not present

## 2017-06-08 DIAGNOSIS — D689 Coagulation defect, unspecified: Secondary | ICD-10-CM | POA: Diagnosis not present

## 2017-06-08 DIAGNOSIS — N186 End stage renal disease: Secondary | ICD-10-CM | POA: Diagnosis not present

## 2017-06-08 DIAGNOSIS — Z23 Encounter for immunization: Secondary | ICD-10-CM | POA: Diagnosis not present

## 2017-06-08 DIAGNOSIS — N2581 Secondary hyperparathyroidism of renal origin: Secondary | ICD-10-CM | POA: Diagnosis not present

## 2017-06-08 DIAGNOSIS — Z7689 Persons encountering health services in other specified circumstances: Secondary | ICD-10-CM | POA: Diagnosis not present

## 2017-06-08 DIAGNOSIS — E1129 Type 2 diabetes mellitus with other diabetic kidney complication: Secondary | ICD-10-CM | POA: Diagnosis not present

## 2017-06-08 DIAGNOSIS — D631 Anemia in chronic kidney disease: Secondary | ICD-10-CM | POA: Diagnosis not present

## 2017-06-09 DIAGNOSIS — E1122 Type 2 diabetes mellitus with diabetic chronic kidney disease: Secondary | ICD-10-CM | POA: Diagnosis not present

## 2017-06-09 DIAGNOSIS — N186 End stage renal disease: Secondary | ICD-10-CM | POA: Diagnosis not present

## 2017-06-09 DIAGNOSIS — Z992 Dependence on renal dialysis: Secondary | ICD-10-CM | POA: Diagnosis not present

## 2017-06-11 DIAGNOSIS — N2581 Secondary hyperparathyroidism of renal origin: Secondary | ICD-10-CM | POA: Diagnosis not present

## 2017-06-11 DIAGNOSIS — Z7689 Persons encountering health services in other specified circumstances: Secondary | ICD-10-CM | POA: Diagnosis not present

## 2017-06-11 DIAGNOSIS — N186 End stage renal disease: Secondary | ICD-10-CM | POA: Diagnosis not present

## 2017-06-11 DIAGNOSIS — D631 Anemia in chronic kidney disease: Secondary | ICD-10-CM | POA: Diagnosis not present

## 2017-06-11 DIAGNOSIS — D689 Coagulation defect, unspecified: Secondary | ICD-10-CM | POA: Diagnosis not present

## 2017-06-11 DIAGNOSIS — E11621 Type 2 diabetes mellitus with foot ulcer: Secondary | ICD-10-CM | POA: Diagnosis not present

## 2017-06-13 DIAGNOSIS — N2581 Secondary hyperparathyroidism of renal origin: Secondary | ICD-10-CM | POA: Diagnosis not present

## 2017-06-13 DIAGNOSIS — D689 Coagulation defect, unspecified: Secondary | ICD-10-CM | POA: Diagnosis not present

## 2017-06-13 DIAGNOSIS — D631 Anemia in chronic kidney disease: Secondary | ICD-10-CM | POA: Diagnosis not present

## 2017-06-13 DIAGNOSIS — E11621 Type 2 diabetes mellitus with foot ulcer: Secondary | ICD-10-CM | POA: Diagnosis not present

## 2017-06-13 DIAGNOSIS — N186 End stage renal disease: Secondary | ICD-10-CM | POA: Diagnosis not present

## 2017-06-13 DIAGNOSIS — Z7689 Persons encountering health services in other specified circumstances: Secondary | ICD-10-CM | POA: Diagnosis not present

## 2017-06-15 DIAGNOSIS — D689 Coagulation defect, unspecified: Secondary | ICD-10-CM | POA: Diagnosis not present

## 2017-06-15 DIAGNOSIS — D631 Anemia in chronic kidney disease: Secondary | ICD-10-CM | POA: Diagnosis not present

## 2017-06-15 DIAGNOSIS — Z7689 Persons encountering health services in other specified circumstances: Secondary | ICD-10-CM | POA: Diagnosis not present

## 2017-06-15 DIAGNOSIS — E11621 Type 2 diabetes mellitus with foot ulcer: Secondary | ICD-10-CM | POA: Diagnosis not present

## 2017-06-15 DIAGNOSIS — N186 End stage renal disease: Secondary | ICD-10-CM | POA: Diagnosis not present

## 2017-06-15 DIAGNOSIS — N2581 Secondary hyperparathyroidism of renal origin: Secondary | ICD-10-CM | POA: Diagnosis not present

## 2017-06-17 DIAGNOSIS — D689 Coagulation defect, unspecified: Secondary | ICD-10-CM | POA: Diagnosis not present

## 2017-06-17 DIAGNOSIS — N186 End stage renal disease: Secondary | ICD-10-CM | POA: Diagnosis not present

## 2017-06-17 DIAGNOSIS — E11621 Type 2 diabetes mellitus with foot ulcer: Secondary | ICD-10-CM | POA: Diagnosis not present

## 2017-06-17 DIAGNOSIS — D631 Anemia in chronic kidney disease: Secondary | ICD-10-CM | POA: Diagnosis not present

## 2017-06-17 DIAGNOSIS — N2581 Secondary hyperparathyroidism of renal origin: Secondary | ICD-10-CM | POA: Diagnosis not present

## 2017-06-18 DIAGNOSIS — E11621 Type 2 diabetes mellitus with foot ulcer: Secondary | ICD-10-CM | POA: Diagnosis not present

## 2017-06-18 DIAGNOSIS — D689 Coagulation defect, unspecified: Secondary | ICD-10-CM | POA: Diagnosis not present

## 2017-06-18 DIAGNOSIS — N186 End stage renal disease: Secondary | ICD-10-CM | POA: Diagnosis not present

## 2017-06-18 DIAGNOSIS — N2581 Secondary hyperparathyroidism of renal origin: Secondary | ICD-10-CM | POA: Diagnosis not present

## 2017-06-18 DIAGNOSIS — Z7689 Persons encountering health services in other specified circumstances: Secondary | ICD-10-CM | POA: Diagnosis not present

## 2017-06-18 DIAGNOSIS — D631 Anemia in chronic kidney disease: Secondary | ICD-10-CM | POA: Diagnosis not present

## 2017-06-19 ENCOUNTER — Ambulatory Visit: Payer: Medicare Other | Admitting: Cardiology

## 2017-06-20 DIAGNOSIS — Z7689 Persons encountering health services in other specified circumstances: Secondary | ICD-10-CM | POA: Diagnosis not present

## 2017-06-20 DIAGNOSIS — N2581 Secondary hyperparathyroidism of renal origin: Secondary | ICD-10-CM | POA: Diagnosis not present

## 2017-06-20 DIAGNOSIS — D689 Coagulation defect, unspecified: Secondary | ICD-10-CM | POA: Diagnosis not present

## 2017-06-20 DIAGNOSIS — E11621 Type 2 diabetes mellitus with foot ulcer: Secondary | ICD-10-CM | POA: Diagnosis not present

## 2017-06-20 DIAGNOSIS — D631 Anemia in chronic kidney disease: Secondary | ICD-10-CM | POA: Diagnosis not present

## 2017-06-20 DIAGNOSIS — N186 End stage renal disease: Secondary | ICD-10-CM | POA: Diagnosis not present

## 2017-06-22 DIAGNOSIS — D631 Anemia in chronic kidney disease: Secondary | ICD-10-CM | POA: Diagnosis not present

## 2017-06-22 DIAGNOSIS — N2581 Secondary hyperparathyroidism of renal origin: Secondary | ICD-10-CM | POA: Diagnosis not present

## 2017-06-22 DIAGNOSIS — Z7689 Persons encountering health services in other specified circumstances: Secondary | ICD-10-CM | POA: Diagnosis not present

## 2017-06-22 DIAGNOSIS — E11621 Type 2 diabetes mellitus with foot ulcer: Secondary | ICD-10-CM | POA: Diagnosis not present

## 2017-06-22 DIAGNOSIS — D689 Coagulation defect, unspecified: Secondary | ICD-10-CM | POA: Diagnosis not present

## 2017-06-22 DIAGNOSIS — N186 End stage renal disease: Secondary | ICD-10-CM | POA: Diagnosis not present

## 2017-06-25 DIAGNOSIS — N2581 Secondary hyperparathyroidism of renal origin: Secondary | ICD-10-CM | POA: Diagnosis not present

## 2017-06-25 DIAGNOSIS — D631 Anemia in chronic kidney disease: Secondary | ICD-10-CM | POA: Diagnosis not present

## 2017-06-25 DIAGNOSIS — D689 Coagulation defect, unspecified: Secondary | ICD-10-CM | POA: Diagnosis not present

## 2017-06-25 DIAGNOSIS — Z7689 Persons encountering health services in other specified circumstances: Secondary | ICD-10-CM | POA: Diagnosis not present

## 2017-06-25 DIAGNOSIS — E11621 Type 2 diabetes mellitus with foot ulcer: Secondary | ICD-10-CM | POA: Diagnosis not present

## 2017-06-25 DIAGNOSIS — N186 End stage renal disease: Secondary | ICD-10-CM | POA: Diagnosis not present

## 2017-06-27 DIAGNOSIS — D631 Anemia in chronic kidney disease: Secondary | ICD-10-CM | POA: Diagnosis not present

## 2017-06-27 DIAGNOSIS — N186 End stage renal disease: Secondary | ICD-10-CM | POA: Diagnosis not present

## 2017-06-27 DIAGNOSIS — N2581 Secondary hyperparathyroidism of renal origin: Secondary | ICD-10-CM | POA: Diagnosis not present

## 2017-06-27 DIAGNOSIS — E11621 Type 2 diabetes mellitus with foot ulcer: Secondary | ICD-10-CM | POA: Diagnosis not present

## 2017-06-27 DIAGNOSIS — Z7689 Persons encountering health services in other specified circumstances: Secondary | ICD-10-CM | POA: Diagnosis not present

## 2017-06-27 DIAGNOSIS — D689 Coagulation defect, unspecified: Secondary | ICD-10-CM | POA: Diagnosis not present

## 2017-06-29 DIAGNOSIS — E11621 Type 2 diabetes mellitus with foot ulcer: Secondary | ICD-10-CM | POA: Diagnosis not present

## 2017-06-29 DIAGNOSIS — N186 End stage renal disease: Secondary | ICD-10-CM | POA: Diagnosis not present

## 2017-06-29 DIAGNOSIS — N2581 Secondary hyperparathyroidism of renal origin: Secondary | ICD-10-CM | POA: Diagnosis not present

## 2017-06-29 DIAGNOSIS — Z7689 Persons encountering health services in other specified circumstances: Secondary | ICD-10-CM | POA: Diagnosis not present

## 2017-06-29 DIAGNOSIS — D631 Anemia in chronic kidney disease: Secondary | ICD-10-CM | POA: Diagnosis not present

## 2017-06-29 DIAGNOSIS — D689 Coagulation defect, unspecified: Secondary | ICD-10-CM | POA: Diagnosis not present

## 2017-07-02 ENCOUNTER — Encounter: Payer: Self-pay | Admitting: Cardiology

## 2017-07-02 DIAGNOSIS — N2581 Secondary hyperparathyroidism of renal origin: Secondary | ICD-10-CM | POA: Diagnosis not present

## 2017-07-02 DIAGNOSIS — E11621 Type 2 diabetes mellitus with foot ulcer: Secondary | ICD-10-CM | POA: Diagnosis not present

## 2017-07-02 DIAGNOSIS — D689 Coagulation defect, unspecified: Secondary | ICD-10-CM | POA: Diagnosis not present

## 2017-07-02 DIAGNOSIS — D631 Anemia in chronic kidney disease: Secondary | ICD-10-CM | POA: Diagnosis not present

## 2017-07-02 DIAGNOSIS — N186 End stage renal disease: Secondary | ICD-10-CM | POA: Diagnosis not present

## 2017-07-02 DIAGNOSIS — Z7689 Persons encountering health services in other specified circumstances: Secondary | ICD-10-CM | POA: Diagnosis not present

## 2017-07-02 NOTE — Progress Notes (Signed)
Cardiology Office Note  Date: 07/03/2017   ID: [name], DOB 1932-03-12, MRN [id number]  PCP: [facility]  Consulting Cardiologist: Rozann Lesches, MD   Chief Complaint  Patient presents with  . Heart Murmur    History of Present Illness: [name] is an 83 y.o. female former patient of Dr. Lattie Farley, last seen by Ms. West Pugh in July 2014. She is referred for cardiology consultation by Dr. Gerarda Fraction for evaluation of heart murmur. She presents today with a family member. She states that she has been aware of having a heart murmur for several years. She is an end-stage renal disease patient, follows on hemodialysis with Dr. Florene Glen. She has had some trouble with weight gain over the last year, but states that this is coming under better control. She reports NYHA class II dyspnea, no angina symptoms, palpitations, or syncope.  Last echocardiogram was in 2014 revealing LVEF 55-60% with moderate diastolic dysfunction and mildly sclerotic aortic valve.  I reviewed her medications which are outlined below.  I personally reviewed her ECG today which shows sinus rhythm with PVCs and leftward axis, nonspecific ST changes.  Past Medical History:  Diagnosis Date  . Anemia associated with chronic renal failure    ESA therapy  . Diabetic foot ulcers ([place])   . ESRD on hemodialysis ([place])   . Essential hypertension   . Headache   . Heart murmur   . History of blood transfusion 1975  . History of pneumonia 02/2011  . Hypothyroidism   . Osteoporosis   . Rheumatoid arthritis ([place])   . Type 2 diabetes mellitus ([place])   . Vaginal pessary present ~ 2012    Past Surgical History:  Procedure Laterality Date  . AV FISTULA PLACEMENT Left 01/26/2015   Procedure: LEFT ARM ARTERIOVENOUS (AV) FISTULA CREATION;  Surgeon: Conrad Andover, MD;  Location: [place];  Service: Vascular;  Laterality: Left;  . CATARACT EXTRACTION W/ INTRAOCULAR LENS  IMPLANT, BILATERAL Bilateral  1990's  . COLONOSCOPY    . LIGATION OF COMPETING BRANCHES OF ARTERIOVENOUS FISTULA Left 06/01/2016   Procedure: LIGATION OF COMPETING BRANCHES OF BRACHIOCEPHALIC ARTERIOVENOUS FISTULA LEFT ARM;  Surgeon: Angelia Mould, MD;  Location: [place];  Service: Vascular;  Laterality: Left;  . ORIF FEMUR FRACTURE Left 1975   "got a rod and screw in it" (02/16/2013)  . PERIPHERAL VASCULAR CATHETERIZATION Left 05/21/2016   Procedure: A/V Nolon Stalls;  Surgeon: Angelia Mould, MD;  Location: [place] [facility];  Service: Cardiovascular;  Laterality: Left;  upper arm    Current Outpatient Prescriptions  Medication Sig Dispense Refill  . ALPRAZolam (XANAX) 0.5 MG tablet Take 0.5 mg by mouth 3 (three) times daily as needed for anxiety.     . Cholecalciferol (VITAMIN D) 2000 units CAPS Take 1 capsule by mouth daily.    . fish oil-omega-3 fatty acids 1000 MG capsule Take 2 g by mouth daily.     Marland Kitchen levothyroxine (SYNTHROID, LEVOTHROID) 200 MCG tablet Take 1 tablet by mouth daily.    Marland Kitchen lidocaine-prilocaine (EMLA) cream Apply 1 application topically as needed.    . SENSIPAR 30 MG tablet Take 30 mg by mouth daily.     . sevelamer carbonate (RENVELA) 800 MG tablet Take 800 mg by mouth 2 (two) times daily.     Marland Kitchen UNABLE TO FIND Nephrovite DAW-one tab daily     No current facility-administered medications for this visit.    Allergies:  Iodinated diagnostic agents; Rena-vite [  nephro-vite]; and Lipitor [atorvastatin]   Social History: The patient  reports that she has never smoked. She has never used smokeless tobacco. She reports that she does not drink alcohol or use drugs.   Family History: The patient's family history includes Cancer in her daughter and grandchild; Diabetes in her father; Heart disease in her brother, daughter, mother, other, and sister; Kidney disease in her other; Stroke in her other.   ROS:  Please see the history of present illness. Otherwise, complete review of systems is positive  for reports dialysis access revision, third-degree finger burn earlier this year.  All other systems are reviewed and negative.   Physical Exam: VS:  BP 134/70   Pulse 80   Ht 5' 6.5" (1.689 m)   Wt 229 lb 6.4 oz (104.1 kg)   SpO2 98% Comment: on room air  BMI 36.47 kg/m , BMI Body mass index is 36.47 kg/m.  Wt Readings from Last 3 Encounters:  07/03/17 229 lb 6.4 oz (104.1 kg)  05/20/17 230 lb (104.3 kg)  01/14/17 225 lb (102.1 kg)    General: Elderly woman, appears comfortable at rest. HEENT: Conjunctiva and lids normal, oropharynx clear. Neck: Supple, no elevated JVP, left carotid bruit, no thyromegaly. Lungs: Clear to auscultation, nonlabored breathing at rest. Cardiac: Regular rate and rhythm, no S3, 3/6 systolic murmur base to apex, no pericardial rub. Abdomen: Soft, nontender, bowel sounds present, no guarding or rebound. Extremities: AV fistula left arm. No pitting leg edema, distal pulses 1-2+. Skin: Warm and dry. Musculoskeletal: No kyphosis. Neuropsychiatric: Alert and oriented x3, affect grossly appropriate.  ECG: I personally reviewed the tracing from 05/21/2016 which showed sinus rhythm with left anterior fascicular block versus old inferior infarct pattern, rule out old anterior infarct pattern, diffuse nonspecific ST-T wave abnormalities.  Recent Labwork:  September 2017: Potassium 5.3, BUN 55, creatinine 7.0  Other Studies Reviewed Today:  Echocardiogram 04/20/2013: Study Conclusions  - Left ventricle: The cavity size was normal. There was mild concentric hypertrophy. Systolic function was normal. The estimated ejection fraction was in the range of 55% to 60%. Wall motion was normal; there were no regional wall motion abnormalities. Features are consistent with a pseudonormal left ventricular filling pattern, with concomitant abnormal relaxation and increased filling pressure (grade 2 diastolic dysfunction). Doppler parameters are  consistent with high ventricular filling pressure. - Aortic valve: Mildly calcified annulus. Trileaflet; mildly thickened leaflets. No significant regurgitation. - Mitral valve: Calcified annulus. Trivial regurgitation. - Left atrium: The atrium was moderately dilated. - Right atrium: Central venous pressure: 26m Hg (est). - Tricuspid valve: Physiologic regurgitation. - Pulmonary arteries: Systolic pressure could not be accurately estimated. - Pericardium, extracardiac: There was no pericardial effusion.  Impressions:  - Comparison to prior study March 2010. Mild LVH with LVEF 595-63% grade 2 diastolic dysfunction with increased filling pressures - seen previously as well. Moderate left atrial enlargement. Mild MAC with trace mitral regurgitation. Sclerotic aortic valve without stenosis. Apparent views of coronary sinus noted in apical views - no definite ASD. Trace tricuspid regurgitation, uinable to assess PASP - previously moderately elevated. CVP estimated at 8 mmHg.  Assessment and Plan:  1. Systolic heart murmur, mainly aortic position. With previously documented aortic valve sclerosis and ESRD, she may well have some degree of aortic stenosis at this point. We will obtain a follow-up echocardiogram.  2. ESRD on hemodialysis. She follows with Dr. PFlorene Glen  3. Essential hypertension by history, currently not on antihypertensive medications.  4. Left carotid bruit. Obtain carotid  Dopplers.  Current medicines were reviewed with the patient today.   Orders Placed This Encounter  Procedures  . US Carotid Bilateral  . EKG 12-Lead  . ECHOCARDIOGRAM COMPLETE    Disposition: Follow up on test results and determine disposition.  Signed, Satira Sark, MD, [facility] 07/03/2017 1:46 PM    [place] at [facility] [address], [place], [place] [address] Phone: [phone]; Fax: [phone]

## 2017-07-03 ENCOUNTER — Encounter: Payer: Self-pay | Admitting: Cardiology

## 2017-07-03 ENCOUNTER — Ambulatory Visit (INDEPENDENT_AMBULATORY_CARE_PROVIDER_SITE_OTHER): Payer: Medicare Other | Admitting: Cardiology

## 2017-07-03 VITALS — BP 134/70 | HR 80 | Ht 66.5 in | Wt 229.4 lb

## 2017-07-03 DIAGNOSIS — Z992 Dependence on renal dialysis: Secondary | ICD-10-CM | POA: Diagnosis not present

## 2017-07-03 DIAGNOSIS — I10 Essential (primary) hypertension: Secondary | ICD-10-CM

## 2017-07-03 DIAGNOSIS — N186 End stage renal disease: Secondary | ICD-10-CM

## 2017-07-03 DIAGNOSIS — R0989 Other specified symptoms and signs involving the circulatory and respiratory systems: Secondary | ICD-10-CM | POA: Diagnosis not present

## 2017-07-03 DIAGNOSIS — Z7689 Persons encountering health services in other specified circumstances: Secondary | ICD-10-CM | POA: Diagnosis not present

## 2017-07-03 DIAGNOSIS — R011 Cardiac murmur, unspecified: Secondary | ICD-10-CM

## 2017-07-03 NOTE — Patient Instructions (Addendum)
Medication Instructions:   Your physician recommends that you continue on your current medications as directed. Please refer to the Current Medication list given to you today.  Labwork:  NONE  Testing/Procedures: Your physician has requested that you have an echocardiogram. Echocardiography is a painless test that uses sound waves to create images of your heart. It provides your doctor with information about the size and shape of your heart and how well your heart's chambers and valves are working. This procedure takes approximately one hour. There are no restrictions for this procedure. Your physician has requested that you have a carotid duplex. This test is an ultrasound of the carotid arteries in your neck. It looks at blood flow through these arteries that supply the brain with blood. Allow one hour for this exam. There are no restrictions or special instructions.  Follow-Up:  Your physician recommends that you schedule a follow-up appointment in: pending results.  Any Other Special Instructions Will Be Listed Below (If Applicable).  If you need a refill on your cardiac medications before your next appointment, please call your pharmacy.

## 2017-07-04 DIAGNOSIS — N2581 Secondary hyperparathyroidism of renal origin: Secondary | ICD-10-CM | POA: Diagnosis not present

## 2017-07-04 DIAGNOSIS — E11621 Type 2 diabetes mellitus with foot ulcer: Secondary | ICD-10-CM | POA: Diagnosis not present

## 2017-07-04 DIAGNOSIS — N186 End stage renal disease: Secondary | ICD-10-CM | POA: Diagnosis not present

## 2017-07-04 DIAGNOSIS — Z7689 Persons encountering health services in other specified circumstances: Secondary | ICD-10-CM | POA: Diagnosis not present

## 2017-07-04 DIAGNOSIS — D689 Coagulation defect, unspecified: Secondary | ICD-10-CM | POA: Diagnosis not present

## 2017-07-04 DIAGNOSIS — D631 Anemia in chronic kidney disease: Secondary | ICD-10-CM | POA: Diagnosis not present

## 2017-07-06 DIAGNOSIS — N2581 Secondary hyperparathyroidism of renal origin: Secondary | ICD-10-CM | POA: Diagnosis not present

## 2017-07-06 DIAGNOSIS — N186 End stage renal disease: Secondary | ICD-10-CM | POA: Diagnosis not present

## 2017-07-06 DIAGNOSIS — D689 Coagulation defect, unspecified: Secondary | ICD-10-CM | POA: Diagnosis not present

## 2017-07-06 DIAGNOSIS — E11621 Type 2 diabetes mellitus with foot ulcer: Secondary | ICD-10-CM | POA: Diagnosis not present

## 2017-07-06 DIAGNOSIS — Z7689 Persons encountering health services in other specified circumstances: Secondary | ICD-10-CM | POA: Diagnosis not present

## 2017-07-06 DIAGNOSIS — D631 Anemia in chronic kidney disease: Secondary | ICD-10-CM | POA: Diagnosis not present

## 2017-07-09 DIAGNOSIS — N186 End stage renal disease: Secondary | ICD-10-CM | POA: Diagnosis not present

## 2017-07-09 DIAGNOSIS — E11621 Type 2 diabetes mellitus with foot ulcer: Secondary | ICD-10-CM | POA: Diagnosis not present

## 2017-07-09 DIAGNOSIS — N2581 Secondary hyperparathyroidism of renal origin: Secondary | ICD-10-CM | POA: Diagnosis not present

## 2017-07-09 DIAGNOSIS — D689 Coagulation defect, unspecified: Secondary | ICD-10-CM | POA: Diagnosis not present

## 2017-07-09 DIAGNOSIS — Z7689 Persons encountering health services in other specified circumstances: Secondary | ICD-10-CM | POA: Diagnosis not present

## 2017-07-09 DIAGNOSIS — D631 Anemia in chronic kidney disease: Secondary | ICD-10-CM | POA: Diagnosis not present

## 2017-07-10 ENCOUNTER — Ambulatory Visit (HOSPITAL_COMMUNITY)
Admission: RE | Admit: 2017-07-10 | Discharge: 2017-07-10 | Disposition: A | Discharge status: Home / Self Care | Payer: Medicare Other | Source: Ambulatory Visit | Attending: Cardiology | Admitting: Cardiology

## 2017-07-10 DIAGNOSIS — I10 Essential (primary) hypertension: Secondary | ICD-10-CM | POA: Diagnosis not present

## 2017-07-10 DIAGNOSIS — R0989 Other specified symptoms and signs involving the circulatory and respiratory systems: Secondary | ICD-10-CM | POA: Diagnosis present

## 2017-07-10 DIAGNOSIS — I6523 Occlusion and stenosis of bilateral carotid arteries: Secondary | ICD-10-CM | POA: Insufficient documentation

## 2017-07-10 DIAGNOSIS — I1310 Hypertensive heart and chronic kidney disease without heart failure, with stage 1 through stage 4 chronic kidney disease, or unspecified chronic kidney disease: Secondary | ICD-10-CM | POA: Insufficient documentation

## 2017-07-10 DIAGNOSIS — E785 Hyperlipidemia, unspecified: Secondary | ICD-10-CM | POA: Insufficient documentation

## 2017-07-10 DIAGNOSIS — E669 Obesity, unspecified: Secondary | ICD-10-CM | POA: Insufficient documentation

## 2017-07-10 DIAGNOSIS — I082 Rheumatic disorders of both aortic and tricuspid valves: Secondary | ICD-10-CM | POA: Diagnosis not present

## 2017-07-10 DIAGNOSIS — N184 Chronic kidney disease, stage 4 (severe): Secondary | ICD-10-CM | POA: Insufficient documentation

## 2017-07-10 DIAGNOSIS — Z7689 Persons encountering health services in other specified circumstances: Secondary | ICD-10-CM | POA: Diagnosis not present

## 2017-07-10 DIAGNOSIS — R011 Cardiac murmur, unspecified: Secondary | ICD-10-CM | POA: Diagnosis not present

## 2017-07-10 DIAGNOSIS — Z6836 Body mass index (BMI) 36.0-36.9, adult: Secondary | ICD-10-CM | POA: Insufficient documentation

## 2017-07-10 DIAGNOSIS — E1122 Type 2 diabetes mellitus with diabetic chronic kidney disease: Secondary | ICD-10-CM | POA: Insufficient documentation

## 2017-07-10 DIAGNOSIS — Z992 Dependence on renal dialysis: Secondary | ICD-10-CM | POA: Diagnosis not present

## 2017-07-10 DIAGNOSIS — N186 End stage renal disease: Secondary | ICD-10-CM | POA: Diagnosis not present

## 2017-07-10 LAB — ECHOCARDIOGRAM COMPLETE
AO mean calculated velocity dopler: 247 cm/s
AOPV: 0.3 m/s
AOVTI: 98.9 cm
AV Area VTI index: 0.29 cm2/m2
AV Mean grad: 28 mmHg
AV Peak grad: 46 mmHg
AV VEL mean LVOT/AV: 0.26
AV peak Index: 0.3
AV pk vel: 340 cm/s
AV vel: 0.66
AVAREAMEANV: 0.6 cm2
AVAREAMEANVIN: 0.26 cm2/m2
AVAREAVTI: 0.67 cm2
CHL CUP AV VALUE AREA INDEX: 0.29
CHL CUP DOP CALC LVOT VTI: 28.9 cm
CHL CUP MV DEC (S): 208
CHL CUP RV SYS PRESS: 48 mmHg
CHL CUP TV REG PEAK VELOCITY: 335 cm/s
E decel time: 208 msec
E/e' ratio: 19.57
FS: 30 % (ref 28–44)
IVS/LV PW RATIO, ED: 0.86
LA diam index: 2 cm/m2
LA vol index: 34.5 mL/m2
LASIZE: 45 mm
LAVOL: 77.7 mL
LAVOLA4C: 87 mL
LEFT ATRIUM END SYS DIAM: 45 mm
LV E/e'average: 19.57
LV dias vol index: 49 mL/m2
LV e' LATERAL: 7.51 cm/s
LV sys vol: 37 mL
LVDIAVOL: 110 mL — AB (ref 46–106)
LVEEMED: 19.57
LVOT SV: 66 mL
LVOT area: 2.27 cm2
LVOT diameter: 17 mm
LVOT peak grad rest: 4 mmHg
LVOT peak vel: 101 cm/s
LVOTVTI: 0.29 cm
LVSYSVOLIN: 16 mL/m2
Lateral S' vel: 10.1 cm/s
MV Peak grad: 9 mmHg
MV pk E vel: 147 m/s
MVPKAVEL: 127 m/s
PW: 11.6 mm — AB (ref 0.6–1.1)
RV TAPSE: 35.2 mm
Simpson's disk: 66
Stroke v: 73 ml
TDI e' lateral: 7.51
TDI e' medial: 5.98
TR max vel: 335 cm/s
Valve area: 0.66 cm2

## 2017-07-10 NOTE — Progress Notes (Signed)
*  PRELIMINARY RESULTS* Echocardiogram 2D Echocardiogram has been performed.  Stacey Drain 07/10/2017, 2:00 PM

## 2017-07-11 DIAGNOSIS — N186 End stage renal disease: Secondary | ICD-10-CM | POA: Diagnosis not present

## 2017-07-11 DIAGNOSIS — E1129 Type 2 diabetes mellitus with other diabetic kidney complication: Secondary | ICD-10-CM | POA: Diagnosis not present

## 2017-07-11 DIAGNOSIS — D631 Anemia in chronic kidney disease: Secondary | ICD-10-CM | POA: Diagnosis not present

## 2017-07-11 DIAGNOSIS — Z7689 Persons encountering health services in other specified circumstances: Secondary | ICD-10-CM | POA: Diagnosis not present

## 2017-07-11 DIAGNOSIS — D689 Coagulation defect, unspecified: Secondary | ICD-10-CM | POA: Diagnosis not present

## 2017-07-11 DIAGNOSIS — N2581 Secondary hyperparathyroidism of renal origin: Secondary | ICD-10-CM | POA: Diagnosis not present

## 2017-07-12 DIAGNOSIS — L84 Corns and callosities: Secondary | ICD-10-CM | POA: Diagnosis not present

## 2017-07-12 DIAGNOSIS — E119 Type 2 diabetes mellitus without complications: Secondary | ICD-10-CM | POA: Diagnosis not present

## 2017-07-13 DIAGNOSIS — D689 Coagulation defect, unspecified: Secondary | ICD-10-CM | POA: Diagnosis not present

## 2017-07-13 DIAGNOSIS — N2581 Secondary hyperparathyroidism of renal origin: Secondary | ICD-10-CM | POA: Diagnosis not present

## 2017-07-13 DIAGNOSIS — D631 Anemia in chronic kidney disease: Secondary | ICD-10-CM | POA: Diagnosis not present

## 2017-07-13 DIAGNOSIS — Z7689 Persons encountering health services in other specified circumstances: Secondary | ICD-10-CM | POA: Diagnosis not present

## 2017-07-13 DIAGNOSIS — E1129 Type 2 diabetes mellitus with other diabetic kidney complication: Secondary | ICD-10-CM | POA: Diagnosis not present

## 2017-07-13 DIAGNOSIS — N186 End stage renal disease: Secondary | ICD-10-CM | POA: Diagnosis not present

## 2017-07-16 DIAGNOSIS — E1129 Type 2 diabetes mellitus with other diabetic kidney complication: Secondary | ICD-10-CM | POA: Diagnosis not present

## 2017-07-16 DIAGNOSIS — N186 End stage renal disease: Secondary | ICD-10-CM | POA: Diagnosis not present

## 2017-07-16 DIAGNOSIS — Z7689 Persons encountering health services in other specified circumstances: Secondary | ICD-10-CM | POA: Diagnosis not present

## 2017-07-16 DIAGNOSIS — D689 Coagulation defect, unspecified: Secondary | ICD-10-CM | POA: Diagnosis not present

## 2017-07-16 DIAGNOSIS — N2581 Secondary hyperparathyroidism of renal origin: Secondary | ICD-10-CM | POA: Diagnosis not present

## 2017-07-16 DIAGNOSIS — D631 Anemia in chronic kidney disease: Secondary | ICD-10-CM | POA: Diagnosis not present

## 2017-07-17 DIAGNOSIS — Z7689 Persons encountering health services in other specified circumstances: Secondary | ICD-10-CM | POA: Diagnosis not present

## 2017-07-17 DIAGNOSIS — E039 Hypothyroidism, unspecified: Secondary | ICD-10-CM | POA: Diagnosis not present

## 2017-07-18 DIAGNOSIS — N186 End stage renal disease: Secondary | ICD-10-CM | POA: Diagnosis not present

## 2017-07-18 DIAGNOSIS — Z7689 Persons encountering health services in other specified circumstances: Secondary | ICD-10-CM | POA: Diagnosis not present

## 2017-07-18 DIAGNOSIS — E1129 Type 2 diabetes mellitus with other diabetic kidney complication: Secondary | ICD-10-CM | POA: Diagnosis not present

## 2017-07-18 DIAGNOSIS — D631 Anemia in chronic kidney disease: Secondary | ICD-10-CM | POA: Diagnosis not present

## 2017-07-18 DIAGNOSIS — N2581 Secondary hyperparathyroidism of renal origin: Secondary | ICD-10-CM | POA: Diagnosis not present

## 2017-07-18 DIAGNOSIS — D689 Coagulation defect, unspecified: Secondary | ICD-10-CM | POA: Diagnosis not present

## 2017-07-20 DIAGNOSIS — D689 Coagulation defect, unspecified: Secondary | ICD-10-CM | POA: Diagnosis not present

## 2017-07-20 DIAGNOSIS — N2581 Secondary hyperparathyroidism of renal origin: Secondary | ICD-10-CM | POA: Diagnosis not present

## 2017-07-20 DIAGNOSIS — N186 End stage renal disease: Secondary | ICD-10-CM | POA: Diagnosis not present

## 2017-07-20 DIAGNOSIS — E1129 Type 2 diabetes mellitus with other diabetic kidney complication: Secondary | ICD-10-CM | POA: Diagnosis not present

## 2017-07-20 DIAGNOSIS — D631 Anemia in chronic kidney disease: Secondary | ICD-10-CM | POA: Diagnosis not present

## 2017-07-20 DIAGNOSIS — Z7689 Persons encountering health services in other specified circumstances: Secondary | ICD-10-CM | POA: Diagnosis not present

## 2017-07-23 DIAGNOSIS — N186 End stage renal disease: Secondary | ICD-10-CM | POA: Diagnosis not present

## 2017-07-23 DIAGNOSIS — E1129 Type 2 diabetes mellitus with other diabetic kidney complication: Secondary | ICD-10-CM | POA: Diagnosis not present

## 2017-07-23 DIAGNOSIS — D631 Anemia in chronic kidney disease: Secondary | ICD-10-CM | POA: Diagnosis not present

## 2017-07-23 DIAGNOSIS — D689 Coagulation defect, unspecified: Secondary | ICD-10-CM | POA: Diagnosis not present

## 2017-07-23 DIAGNOSIS — N2581 Secondary hyperparathyroidism of renal origin: Secondary | ICD-10-CM | POA: Diagnosis not present

## 2017-07-23 DIAGNOSIS — Z7689 Persons encountering health services in other specified circumstances: Secondary | ICD-10-CM | POA: Diagnosis not present

## 2017-07-25 DIAGNOSIS — D631 Anemia in chronic kidney disease: Secondary | ICD-10-CM | POA: Diagnosis not present

## 2017-07-25 DIAGNOSIS — E1129 Type 2 diabetes mellitus with other diabetic kidney complication: Secondary | ICD-10-CM | POA: Diagnosis not present

## 2017-07-25 DIAGNOSIS — N2581 Secondary hyperparathyroidism of renal origin: Secondary | ICD-10-CM | POA: Diagnosis not present

## 2017-07-25 DIAGNOSIS — D689 Coagulation defect, unspecified: Secondary | ICD-10-CM | POA: Diagnosis not present

## 2017-07-25 DIAGNOSIS — N186 End stage renal disease: Secondary | ICD-10-CM | POA: Diagnosis not present

## 2017-07-25 DIAGNOSIS — Z7689 Persons encountering health services in other specified circumstances: Secondary | ICD-10-CM | POA: Diagnosis not present

## 2017-07-27 DIAGNOSIS — D689 Coagulation defect, unspecified: Secondary | ICD-10-CM | POA: Diagnosis not present

## 2017-07-27 DIAGNOSIS — N2581 Secondary hyperparathyroidism of renal origin: Secondary | ICD-10-CM | POA: Diagnosis not present

## 2017-07-27 DIAGNOSIS — N186 End stage renal disease: Secondary | ICD-10-CM | POA: Diagnosis not present

## 2017-07-27 DIAGNOSIS — Z7689 Persons encountering health services in other specified circumstances: Secondary | ICD-10-CM | POA: Diagnosis not present

## 2017-07-27 DIAGNOSIS — E1129 Type 2 diabetes mellitus with other diabetic kidney complication: Secondary | ICD-10-CM | POA: Diagnosis not present

## 2017-07-27 DIAGNOSIS — D631 Anemia in chronic kidney disease: Secondary | ICD-10-CM | POA: Diagnosis not present

## 2017-07-29 DIAGNOSIS — Z7689 Persons encountering health services in other specified circumstances: Secondary | ICD-10-CM | POA: Diagnosis not present

## 2017-07-29 DIAGNOSIS — N2581 Secondary hyperparathyroidism of renal origin: Secondary | ICD-10-CM | POA: Diagnosis not present

## 2017-07-29 DIAGNOSIS — D689 Coagulation defect, unspecified: Secondary | ICD-10-CM | POA: Diagnosis not present

## 2017-07-29 DIAGNOSIS — E1129 Type 2 diabetes mellitus with other diabetic kidney complication: Secondary | ICD-10-CM | POA: Diagnosis not present

## 2017-07-29 DIAGNOSIS — D631 Anemia in chronic kidney disease: Secondary | ICD-10-CM | POA: Diagnosis not present

## 2017-07-29 DIAGNOSIS — N186 End stage renal disease: Secondary | ICD-10-CM | POA: Diagnosis not present

## 2017-07-31 DIAGNOSIS — D631 Anemia in chronic kidney disease: Secondary | ICD-10-CM | POA: Diagnosis not present

## 2017-07-31 DIAGNOSIS — N2581 Secondary hyperparathyroidism of renal origin: Secondary | ICD-10-CM | POA: Diagnosis not present

## 2017-07-31 DIAGNOSIS — N186 End stage renal disease: Secondary | ICD-10-CM | POA: Diagnosis not present

## 2017-07-31 DIAGNOSIS — Z7689 Persons encountering health services in other specified circumstances: Secondary | ICD-10-CM | POA: Diagnosis not present

## 2017-07-31 DIAGNOSIS — E1129 Type 2 diabetes mellitus with other diabetic kidney complication: Secondary | ICD-10-CM | POA: Diagnosis not present

## 2017-07-31 DIAGNOSIS — D689 Coagulation defect, unspecified: Secondary | ICD-10-CM | POA: Diagnosis not present

## 2017-08-01 DIAGNOSIS — Z7689 Persons encountering health services in other specified circumstances: Secondary | ICD-10-CM | POA: Diagnosis not present

## 2017-08-03 DIAGNOSIS — I10 Essential (primary) hypertension: Secondary | ICD-10-CM | POA: Diagnosis not present

## 2017-08-03 DIAGNOSIS — N2581 Secondary hyperparathyroidism of renal origin: Secondary | ICD-10-CM | POA: Diagnosis not present

## 2017-08-03 DIAGNOSIS — E1129 Type 2 diabetes mellitus with other diabetic kidney complication: Secondary | ICD-10-CM | POA: Diagnosis not present

## 2017-08-03 DIAGNOSIS — D689 Coagulation defect, unspecified: Secondary | ICD-10-CM | POA: Diagnosis not present

## 2017-08-03 DIAGNOSIS — D631 Anemia in chronic kidney disease: Secondary | ICD-10-CM | POA: Diagnosis not present

## 2017-08-03 DIAGNOSIS — N186 End stage renal disease: Secondary | ICD-10-CM | POA: Diagnosis not present

## 2017-08-05 DIAGNOSIS — D689 Coagulation defect, unspecified: Secondary | ICD-10-CM | POA: Diagnosis not present

## 2017-08-05 DIAGNOSIS — N2581 Secondary hyperparathyroidism of renal origin: Secondary | ICD-10-CM | POA: Diagnosis not present

## 2017-08-05 DIAGNOSIS — E1129 Type 2 diabetes mellitus with other diabetic kidney complication: Secondary | ICD-10-CM | POA: Diagnosis not present

## 2017-08-05 DIAGNOSIS — D631 Anemia in chronic kidney disease: Secondary | ICD-10-CM | POA: Diagnosis not present

## 2017-08-05 DIAGNOSIS — N186 End stage renal disease: Secondary | ICD-10-CM | POA: Diagnosis not present

## 2017-08-06 DIAGNOSIS — Z7689 Persons encountering health services in other specified circumstances: Secondary | ICD-10-CM | POA: Diagnosis not present

## 2017-08-06 DIAGNOSIS — N186 End stage renal disease: Secondary | ICD-10-CM | POA: Diagnosis not present

## 2017-08-06 DIAGNOSIS — N2581 Secondary hyperparathyroidism of renal origin: Secondary | ICD-10-CM | POA: Diagnosis not present

## 2017-08-06 DIAGNOSIS — E1129 Type 2 diabetes mellitus with other diabetic kidney complication: Secondary | ICD-10-CM | POA: Diagnosis not present

## 2017-08-06 DIAGNOSIS — D689 Coagulation defect, unspecified: Secondary | ICD-10-CM | POA: Diagnosis not present

## 2017-08-06 DIAGNOSIS — D631 Anemia in chronic kidney disease: Secondary | ICD-10-CM | POA: Diagnosis not present

## 2017-08-08 DIAGNOSIS — N186 End stage renal disease: Secondary | ICD-10-CM | POA: Diagnosis not present

## 2017-08-08 DIAGNOSIS — N2581 Secondary hyperparathyroidism of renal origin: Secondary | ICD-10-CM | POA: Diagnosis not present

## 2017-08-08 DIAGNOSIS — D631 Anemia in chronic kidney disease: Secondary | ICD-10-CM | POA: Diagnosis not present

## 2017-08-08 DIAGNOSIS — E1129 Type 2 diabetes mellitus with other diabetic kidney complication: Secondary | ICD-10-CM | POA: Diagnosis not present

## 2017-08-08 DIAGNOSIS — Z7689 Persons encountering health services in other specified circumstances: Secondary | ICD-10-CM | POA: Diagnosis not present

## 2017-08-08 DIAGNOSIS — D689 Coagulation defect, unspecified: Secondary | ICD-10-CM | POA: Diagnosis not present

## 2017-08-09 DIAGNOSIS — Z992 Dependence on renal dialysis: Secondary | ICD-10-CM | POA: Diagnosis not present

## 2017-08-09 DIAGNOSIS — N186 End stage renal disease: Secondary | ICD-10-CM | POA: Diagnosis not present

## 2017-08-09 DIAGNOSIS — E1122 Type 2 diabetes mellitus with diabetic chronic kidney disease: Secondary | ICD-10-CM | POA: Diagnosis not present

## 2017-08-10 DIAGNOSIS — N2581 Secondary hyperparathyroidism of renal origin: Secondary | ICD-10-CM | POA: Diagnosis not present

## 2017-08-10 DIAGNOSIS — N186 End stage renal disease: Secondary | ICD-10-CM | POA: Diagnosis not present

## 2017-08-10 DIAGNOSIS — Z7689 Persons encountering health services in other specified circumstances: Secondary | ICD-10-CM | POA: Diagnosis not present

## 2017-08-10 DIAGNOSIS — R52 Pain, unspecified: Secondary | ICD-10-CM | POA: Diagnosis not present

## 2017-08-10 DIAGNOSIS — D631 Anemia in chronic kidney disease: Secondary | ICD-10-CM | POA: Diagnosis not present

## 2017-08-10 DIAGNOSIS — D689 Coagulation defect, unspecified: Secondary | ICD-10-CM | POA: Diagnosis not present

## 2017-08-10 DIAGNOSIS — E1129 Type 2 diabetes mellitus with other diabetic kidney complication: Secondary | ICD-10-CM | POA: Diagnosis not present

## 2017-08-13 DIAGNOSIS — R52 Pain, unspecified: Secondary | ICD-10-CM | POA: Diagnosis not present

## 2017-08-13 DIAGNOSIS — N186 End stage renal disease: Secondary | ICD-10-CM | POA: Diagnosis not present

## 2017-08-13 DIAGNOSIS — E1129 Type 2 diabetes mellitus with other diabetic kidney complication: Secondary | ICD-10-CM | POA: Diagnosis not present

## 2017-08-13 DIAGNOSIS — D631 Anemia in chronic kidney disease: Secondary | ICD-10-CM | POA: Diagnosis not present

## 2017-08-13 DIAGNOSIS — Z7689 Persons encountering health services in other specified circumstances: Secondary | ICD-10-CM | POA: Diagnosis not present

## 2017-08-13 DIAGNOSIS — N2581 Secondary hyperparathyroidism of renal origin: Secondary | ICD-10-CM | POA: Diagnosis not present

## 2017-08-13 DIAGNOSIS — D689 Coagulation defect, unspecified: Secondary | ICD-10-CM | POA: Diagnosis not present

## 2017-08-15 DIAGNOSIS — R52 Pain, unspecified: Secondary | ICD-10-CM | POA: Diagnosis not present

## 2017-08-15 DIAGNOSIS — Z7689 Persons encountering health services in other specified circumstances: Secondary | ICD-10-CM | POA: Diagnosis not present

## 2017-08-15 DIAGNOSIS — N186 End stage renal disease: Secondary | ICD-10-CM | POA: Diagnosis not present

## 2017-08-15 DIAGNOSIS — N2581 Secondary hyperparathyroidism of renal origin: Secondary | ICD-10-CM | POA: Diagnosis not present

## 2017-08-15 DIAGNOSIS — D631 Anemia in chronic kidney disease: Secondary | ICD-10-CM | POA: Diagnosis not present

## 2017-08-15 DIAGNOSIS — D689 Coagulation defect, unspecified: Secondary | ICD-10-CM | POA: Diagnosis not present

## 2017-08-15 DIAGNOSIS — E1129 Type 2 diabetes mellitus with other diabetic kidney complication: Secondary | ICD-10-CM | POA: Diagnosis not present

## 2017-08-17 DIAGNOSIS — E1129 Type 2 diabetes mellitus with other diabetic kidney complication: Secondary | ICD-10-CM | POA: Diagnosis not present

## 2017-08-17 DIAGNOSIS — R52 Pain, unspecified: Secondary | ICD-10-CM | POA: Diagnosis not present

## 2017-08-17 DIAGNOSIS — Z7689 Persons encountering health services in other specified circumstances: Secondary | ICD-10-CM | POA: Diagnosis not present

## 2017-08-17 DIAGNOSIS — N2581 Secondary hyperparathyroidism of renal origin: Secondary | ICD-10-CM | POA: Diagnosis not present

## 2017-08-17 DIAGNOSIS — N186 End stage renal disease: Secondary | ICD-10-CM | POA: Diagnosis not present

## 2017-08-17 DIAGNOSIS — D631 Anemia in chronic kidney disease: Secondary | ICD-10-CM | POA: Diagnosis not present

## 2017-08-17 DIAGNOSIS — D689 Coagulation defect, unspecified: Secondary | ICD-10-CM | POA: Diagnosis not present

## 2017-08-20 DIAGNOSIS — D631 Anemia in chronic kidney disease: Secondary | ICD-10-CM | POA: Diagnosis not present

## 2017-08-20 DIAGNOSIS — N186 End stage renal disease: Secondary | ICD-10-CM | POA: Diagnosis not present

## 2017-08-20 DIAGNOSIS — R52 Pain, unspecified: Secondary | ICD-10-CM | POA: Diagnosis not present

## 2017-08-20 DIAGNOSIS — N2581 Secondary hyperparathyroidism of renal origin: Secondary | ICD-10-CM | POA: Diagnosis not present

## 2017-08-20 DIAGNOSIS — D689 Coagulation defect, unspecified: Secondary | ICD-10-CM | POA: Diagnosis not present

## 2017-08-20 DIAGNOSIS — E1129 Type 2 diabetes mellitus with other diabetic kidney complication: Secondary | ICD-10-CM | POA: Diagnosis not present

## 2017-08-22 DIAGNOSIS — R52 Pain, unspecified: Secondary | ICD-10-CM | POA: Diagnosis not present

## 2017-08-22 DIAGNOSIS — N186 End stage renal disease: Secondary | ICD-10-CM | POA: Diagnosis not present

## 2017-08-22 DIAGNOSIS — D689 Coagulation defect, unspecified: Secondary | ICD-10-CM | POA: Diagnosis not present

## 2017-08-22 DIAGNOSIS — N2581 Secondary hyperparathyroidism of renal origin: Secondary | ICD-10-CM | POA: Diagnosis not present

## 2017-08-22 DIAGNOSIS — E1129 Type 2 diabetes mellitus with other diabetic kidney complication: Secondary | ICD-10-CM | POA: Diagnosis not present

## 2017-08-22 DIAGNOSIS — D631 Anemia in chronic kidney disease: Secondary | ICD-10-CM | POA: Diagnosis not present

## 2017-08-22 DIAGNOSIS — Z7689 Persons encountering health services in other specified circumstances: Secondary | ICD-10-CM | POA: Diagnosis not present

## 2017-08-24 DIAGNOSIS — R52 Pain, unspecified: Secondary | ICD-10-CM | POA: Diagnosis not present

## 2017-08-24 DIAGNOSIS — N186 End stage renal disease: Secondary | ICD-10-CM | POA: Diagnosis not present

## 2017-08-24 DIAGNOSIS — N2581 Secondary hyperparathyroidism of renal origin: Secondary | ICD-10-CM | POA: Diagnosis not present

## 2017-08-24 DIAGNOSIS — D689 Coagulation defect, unspecified: Secondary | ICD-10-CM | POA: Diagnosis not present

## 2017-08-24 DIAGNOSIS — D631 Anemia in chronic kidney disease: Secondary | ICD-10-CM | POA: Diagnosis not present

## 2017-08-24 DIAGNOSIS — E1129 Type 2 diabetes mellitus with other diabetic kidney complication: Secondary | ICD-10-CM | POA: Diagnosis not present

## 2017-08-24 DIAGNOSIS — Z7689 Persons encountering health services in other specified circumstances: Secondary | ICD-10-CM | POA: Diagnosis not present

## 2017-08-27 DIAGNOSIS — N186 End stage renal disease: Secondary | ICD-10-CM | POA: Diagnosis not present

## 2017-08-27 DIAGNOSIS — N2581 Secondary hyperparathyroidism of renal origin: Secondary | ICD-10-CM | POA: Diagnosis not present

## 2017-08-27 DIAGNOSIS — D689 Coagulation defect, unspecified: Secondary | ICD-10-CM | POA: Diagnosis not present

## 2017-08-27 DIAGNOSIS — Z7689 Persons encountering health services in other specified circumstances: Secondary | ICD-10-CM | POA: Diagnosis not present

## 2017-08-27 DIAGNOSIS — E1129 Type 2 diabetes mellitus with other diabetic kidney complication: Secondary | ICD-10-CM | POA: Diagnosis not present

## 2017-08-27 DIAGNOSIS — R52 Pain, unspecified: Secondary | ICD-10-CM | POA: Diagnosis not present

## 2017-08-27 DIAGNOSIS — D631 Anemia in chronic kidney disease: Secondary | ICD-10-CM | POA: Diagnosis not present

## 2017-08-28 DIAGNOSIS — R52 Pain, unspecified: Secondary | ICD-10-CM | POA: Diagnosis not present

## 2017-08-28 DIAGNOSIS — N2581 Secondary hyperparathyroidism of renal origin: Secondary | ICD-10-CM | POA: Diagnosis not present

## 2017-08-28 DIAGNOSIS — D631 Anemia in chronic kidney disease: Secondary | ICD-10-CM | POA: Diagnosis not present

## 2017-08-28 DIAGNOSIS — N186 End stage renal disease: Secondary | ICD-10-CM | POA: Diagnosis not present

## 2017-08-28 DIAGNOSIS — D689 Coagulation defect, unspecified: Secondary | ICD-10-CM | POA: Diagnosis not present

## 2017-08-28 DIAGNOSIS — E1129 Type 2 diabetes mellitus with other diabetic kidney complication: Secondary | ICD-10-CM | POA: Diagnosis not present

## 2017-08-29 DIAGNOSIS — D689 Coagulation defect, unspecified: Secondary | ICD-10-CM | POA: Diagnosis not present

## 2017-08-29 DIAGNOSIS — N2581 Secondary hyperparathyroidism of renal origin: Secondary | ICD-10-CM | POA: Diagnosis not present

## 2017-08-29 DIAGNOSIS — E1129 Type 2 diabetes mellitus with other diabetic kidney complication: Secondary | ICD-10-CM | POA: Diagnosis not present

## 2017-08-29 DIAGNOSIS — D631 Anemia in chronic kidney disease: Secondary | ICD-10-CM | POA: Diagnosis not present

## 2017-08-29 DIAGNOSIS — Z7689 Persons encountering health services in other specified circumstances: Secondary | ICD-10-CM | POA: Diagnosis not present

## 2017-08-29 DIAGNOSIS — N186 End stage renal disease: Secondary | ICD-10-CM | POA: Diagnosis not present

## 2017-08-29 DIAGNOSIS — R52 Pain, unspecified: Secondary | ICD-10-CM | POA: Diagnosis not present

## 2017-08-31 DIAGNOSIS — R52 Pain, unspecified: Secondary | ICD-10-CM | POA: Diagnosis not present

## 2017-08-31 DIAGNOSIS — D689 Coagulation defect, unspecified: Secondary | ICD-10-CM | POA: Diagnosis not present

## 2017-08-31 DIAGNOSIS — Z7689 Persons encountering health services in other specified circumstances: Secondary | ICD-10-CM | POA: Diagnosis not present

## 2017-08-31 DIAGNOSIS — N2581 Secondary hyperparathyroidism of renal origin: Secondary | ICD-10-CM | POA: Diagnosis not present

## 2017-08-31 DIAGNOSIS — E1129 Type 2 diabetes mellitus with other diabetic kidney complication: Secondary | ICD-10-CM | POA: Diagnosis not present

## 2017-08-31 DIAGNOSIS — N186 End stage renal disease: Secondary | ICD-10-CM | POA: Diagnosis not present

## 2017-08-31 DIAGNOSIS — D631 Anemia in chronic kidney disease: Secondary | ICD-10-CM | POA: Diagnosis not present

## 2017-09-02 DIAGNOSIS — I10 Essential (primary) hypertension: Secondary | ICD-10-CM | POA: Diagnosis not present

## 2017-09-02 DIAGNOSIS — R52 Pain, unspecified: Secondary | ICD-10-CM | POA: Diagnosis not present

## 2017-09-02 DIAGNOSIS — D631 Anemia in chronic kidney disease: Secondary | ICD-10-CM | POA: Diagnosis not present

## 2017-09-02 DIAGNOSIS — N2581 Secondary hyperparathyroidism of renal origin: Secondary | ICD-10-CM | POA: Diagnosis not present

## 2017-09-02 DIAGNOSIS — E1129 Type 2 diabetes mellitus with other diabetic kidney complication: Secondary | ICD-10-CM | POA: Diagnosis not present

## 2017-09-02 DIAGNOSIS — D689 Coagulation defect, unspecified: Secondary | ICD-10-CM | POA: Diagnosis not present

## 2017-09-02 DIAGNOSIS — N186 End stage renal disease: Secondary | ICD-10-CM | POA: Diagnosis not present

## 2017-09-05 DIAGNOSIS — N2581 Secondary hyperparathyroidism of renal origin: Secondary | ICD-10-CM | POA: Diagnosis not present

## 2017-09-05 DIAGNOSIS — E1129 Type 2 diabetes mellitus with other diabetic kidney complication: Secondary | ICD-10-CM | POA: Diagnosis not present

## 2017-09-05 DIAGNOSIS — D689 Coagulation defect, unspecified: Secondary | ICD-10-CM | POA: Diagnosis not present

## 2017-09-05 DIAGNOSIS — D631 Anemia in chronic kidney disease: Secondary | ICD-10-CM | POA: Diagnosis not present

## 2017-09-05 DIAGNOSIS — Z7689 Persons encountering health services in other specified circumstances: Secondary | ICD-10-CM | POA: Diagnosis not present

## 2017-09-05 DIAGNOSIS — N186 End stage renal disease: Secondary | ICD-10-CM | POA: Diagnosis not present

## 2017-09-05 DIAGNOSIS — R52 Pain, unspecified: Secondary | ICD-10-CM | POA: Diagnosis not present

## 2017-09-07 DIAGNOSIS — Z7689 Persons encountering health services in other specified circumstances: Secondary | ICD-10-CM | POA: Diagnosis not present

## 2017-09-07 DIAGNOSIS — N186 End stage renal disease: Secondary | ICD-10-CM | POA: Diagnosis not present

## 2017-09-07 DIAGNOSIS — D689 Coagulation defect, unspecified: Secondary | ICD-10-CM | POA: Diagnosis not present

## 2017-09-07 DIAGNOSIS — R52 Pain, unspecified: Secondary | ICD-10-CM | POA: Diagnosis not present

## 2017-09-07 DIAGNOSIS — E1129 Type 2 diabetes mellitus with other diabetic kidney complication: Secondary | ICD-10-CM | POA: Diagnosis not present

## 2017-09-07 DIAGNOSIS — N2581 Secondary hyperparathyroidism of renal origin: Secondary | ICD-10-CM | POA: Diagnosis not present

## 2017-09-07 DIAGNOSIS — D631 Anemia in chronic kidney disease: Secondary | ICD-10-CM | POA: Diagnosis not present

## 2017-09-08 DIAGNOSIS — Z7689 Persons encountering health services in other specified circumstances: Secondary | ICD-10-CM | POA: Diagnosis not present

## 2017-09-09 DIAGNOSIS — N186 End stage renal disease: Secondary | ICD-10-CM | POA: Diagnosis not present

## 2017-09-09 DIAGNOSIS — E1129 Type 2 diabetes mellitus with other diabetic kidney complication: Secondary | ICD-10-CM | POA: Diagnosis not present

## 2017-09-09 DIAGNOSIS — R52 Pain, unspecified: Secondary | ICD-10-CM | POA: Diagnosis not present

## 2017-09-09 DIAGNOSIS — E1122 Type 2 diabetes mellitus with diabetic chronic kidney disease: Secondary | ICD-10-CM | POA: Diagnosis not present

## 2017-09-09 DIAGNOSIS — Z7689 Persons encountering health services in other specified circumstances: Secondary | ICD-10-CM | POA: Diagnosis not present

## 2017-09-09 DIAGNOSIS — N2581 Secondary hyperparathyroidism of renal origin: Secondary | ICD-10-CM | POA: Diagnosis not present

## 2017-09-09 DIAGNOSIS — D631 Anemia in chronic kidney disease: Secondary | ICD-10-CM | POA: Diagnosis not present

## 2017-09-09 DIAGNOSIS — D689 Coagulation defect, unspecified: Secondary | ICD-10-CM | POA: Diagnosis not present

## 2017-09-09 DIAGNOSIS — Z992 Dependence on renal dialysis: Secondary | ICD-10-CM | POA: Diagnosis not present

## 2017-09-12 DIAGNOSIS — N2581 Secondary hyperparathyroidism of renal origin: Secondary | ICD-10-CM | POA: Diagnosis not present

## 2017-09-12 DIAGNOSIS — E1129 Type 2 diabetes mellitus with other diabetic kidney complication: Secondary | ICD-10-CM | POA: Diagnosis not present

## 2017-09-12 DIAGNOSIS — N186 End stage renal disease: Secondary | ICD-10-CM | POA: Diagnosis not present

## 2017-09-12 DIAGNOSIS — D631 Anemia in chronic kidney disease: Secondary | ICD-10-CM | POA: Diagnosis not present

## 2017-09-12 DIAGNOSIS — D689 Coagulation defect, unspecified: Secondary | ICD-10-CM | POA: Diagnosis not present

## 2017-09-12 DIAGNOSIS — Z7689 Persons encountering health services in other specified circumstances: Secondary | ICD-10-CM | POA: Diagnosis not present

## 2017-09-13 DIAGNOSIS — Z1389 Encounter for screening for other disorder: Secondary | ICD-10-CM | POA: Diagnosis not present

## 2017-09-13 DIAGNOSIS — I10 Essential (primary) hypertension: Secondary | ICD-10-CM | POA: Diagnosis not present

## 2017-09-13 DIAGNOSIS — Z7689 Persons encountering health services in other specified circumstances: Secondary | ICD-10-CM | POA: Diagnosis not present

## 2017-09-13 DIAGNOSIS — E1140 Type 2 diabetes mellitus with diabetic neuropathy, unspecified: Secondary | ICD-10-CM | POA: Diagnosis not present

## 2017-09-13 DIAGNOSIS — M75101 Unspecified rotator cuff tear or rupture of right shoulder, not specified as traumatic: Secondary | ICD-10-CM | POA: Diagnosis not present

## 2017-09-14 DIAGNOSIS — D689 Coagulation defect, unspecified: Secondary | ICD-10-CM | POA: Diagnosis not present

## 2017-09-14 DIAGNOSIS — E1129 Type 2 diabetes mellitus with other diabetic kidney complication: Secondary | ICD-10-CM | POA: Diagnosis not present

## 2017-09-14 DIAGNOSIS — N186 End stage renal disease: Secondary | ICD-10-CM | POA: Diagnosis not present

## 2017-09-14 DIAGNOSIS — D631 Anemia in chronic kidney disease: Secondary | ICD-10-CM | POA: Diagnosis not present

## 2017-09-14 DIAGNOSIS — N2581 Secondary hyperparathyroidism of renal origin: Secondary | ICD-10-CM | POA: Diagnosis not present

## 2017-09-14 DIAGNOSIS — Z7689 Persons encountering health services in other specified circumstances: Secondary | ICD-10-CM | POA: Diagnosis not present

## 2017-09-17 DIAGNOSIS — N2581 Secondary hyperparathyroidism of renal origin: Secondary | ICD-10-CM | POA: Diagnosis not present

## 2017-09-17 DIAGNOSIS — E1129 Type 2 diabetes mellitus with other diabetic kidney complication: Secondary | ICD-10-CM | POA: Diagnosis not present

## 2017-09-17 DIAGNOSIS — Z7689 Persons encountering health services in other specified circumstances: Secondary | ICD-10-CM | POA: Diagnosis not present

## 2017-09-17 DIAGNOSIS — D689 Coagulation defect, unspecified: Secondary | ICD-10-CM | POA: Diagnosis not present

## 2017-09-17 DIAGNOSIS — N186 End stage renal disease: Secondary | ICD-10-CM | POA: Diagnosis not present

## 2017-09-17 DIAGNOSIS — D631 Anemia in chronic kidney disease: Secondary | ICD-10-CM | POA: Diagnosis not present

## 2017-09-19 DIAGNOSIS — N186 End stage renal disease: Secondary | ICD-10-CM | POA: Diagnosis not present

## 2017-09-19 DIAGNOSIS — D689 Coagulation defect, unspecified: Secondary | ICD-10-CM | POA: Diagnosis not present

## 2017-09-19 DIAGNOSIS — D631 Anemia in chronic kidney disease: Secondary | ICD-10-CM | POA: Diagnosis not present

## 2017-09-19 DIAGNOSIS — E1129 Type 2 diabetes mellitus with other diabetic kidney complication: Secondary | ICD-10-CM | POA: Diagnosis not present

## 2017-09-19 DIAGNOSIS — Z7689 Persons encountering health services in other specified circumstances: Secondary | ICD-10-CM | POA: Diagnosis not present

## 2017-09-19 DIAGNOSIS — N2581 Secondary hyperparathyroidism of renal origin: Secondary | ICD-10-CM | POA: Diagnosis not present

## 2017-09-20 ENCOUNTER — Encounter (INDEPENDENT_AMBULATORY_CARE_PROVIDER_SITE_OTHER): Payer: Self-pay

## 2017-09-20 ENCOUNTER — Ambulatory Visit (INDEPENDENT_AMBULATORY_CARE_PROVIDER_SITE_OTHER): Payer: Medicare Other | Admitting: Obstetrics & Gynecology

## 2017-09-20 ENCOUNTER — Encounter: Payer: Self-pay | Admitting: Obstetrics & Gynecology

## 2017-09-20 VITALS — BP 120/80 | HR 90 | Wt 227.0 lb

## 2017-09-20 DIAGNOSIS — Z7689 Persons encountering health services in other specified circumstances: Secondary | ICD-10-CM | POA: Diagnosis not present

## 2017-09-20 DIAGNOSIS — N813 Complete uterovaginal prolapse: Secondary | ICD-10-CM | POA: Diagnosis not present

## 2017-09-20 DIAGNOSIS — Z4689 Encounter for fitting and adjustment of other specified devices: Secondary | ICD-10-CM | POA: Diagnosis not present

## 2017-09-20 NOTE — Progress Notes (Signed)
Patient ID: [name], female   DOB: March 05, 1932, 84 y.o.   MRN: [id number]  Chief Complaint  Patient presents with  . Pessary Check    clean   Blood pressure 120/80, pulse 90, weight 227 lb (103 kg).   [name] comes to the office today for routine follow-up related to her pessary [name] uses a Milex ring with support #5 We changed her from a Gellhorn which was causing too many problems The Milex ring with support seems to be doing an adequate job for her although it is not a perfect pessary for her defect  Exam today reveals no undue vaginal mucosal breakdown no discharge and no vaginal bleeding  The pessary is removed and cleaned with warm soap and water and replaced without difficulty with good resulting support  Again the vaginal mucosa is healthy with no evidence of undue mucosal pressure  I will see [name] back in 4 months for ongoing pessary maintenance  Lazaro Arms, MD 09/20/2017 1:10 PM

## 2017-09-21 DIAGNOSIS — E1129 Type 2 diabetes mellitus with other diabetic kidney complication: Secondary | ICD-10-CM | POA: Diagnosis not present

## 2017-09-21 DIAGNOSIS — D689 Coagulation defect, unspecified: Secondary | ICD-10-CM | POA: Diagnosis not present

## 2017-09-21 DIAGNOSIS — N2581 Secondary hyperparathyroidism of renal origin: Secondary | ICD-10-CM | POA: Diagnosis not present

## 2017-09-21 DIAGNOSIS — N186 End stage renal disease: Secondary | ICD-10-CM | POA: Diagnosis not present

## 2017-09-21 DIAGNOSIS — Z7689 Persons encountering health services in other specified circumstances: Secondary | ICD-10-CM | POA: Diagnosis not present

## 2017-09-21 DIAGNOSIS — D631 Anemia in chronic kidney disease: Secondary | ICD-10-CM | POA: Diagnosis not present

## 2017-09-24 DIAGNOSIS — N186 End stage renal disease: Secondary | ICD-10-CM | POA: Diagnosis not present

## 2017-09-24 DIAGNOSIS — N2581 Secondary hyperparathyroidism of renal origin: Secondary | ICD-10-CM | POA: Diagnosis not present

## 2017-09-24 DIAGNOSIS — D689 Coagulation defect, unspecified: Secondary | ICD-10-CM | POA: Diagnosis not present

## 2017-09-24 DIAGNOSIS — Z7689 Persons encountering health services in other specified circumstances: Secondary | ICD-10-CM | POA: Diagnosis not present

## 2017-09-24 DIAGNOSIS — E1129 Type 2 diabetes mellitus with other diabetic kidney complication: Secondary | ICD-10-CM | POA: Diagnosis not present

## 2017-09-24 DIAGNOSIS — D631 Anemia in chronic kidney disease: Secondary | ICD-10-CM | POA: Diagnosis not present

## 2017-09-26 DIAGNOSIS — Z7689 Persons encountering health services in other specified circumstances: Secondary | ICD-10-CM | POA: Diagnosis not present

## 2017-09-26 DIAGNOSIS — N2581 Secondary hyperparathyroidism of renal origin: Secondary | ICD-10-CM | POA: Diagnosis not present

## 2017-09-26 DIAGNOSIS — D631 Anemia in chronic kidney disease: Secondary | ICD-10-CM | POA: Diagnosis not present

## 2017-09-26 DIAGNOSIS — D689 Coagulation defect, unspecified: Secondary | ICD-10-CM | POA: Diagnosis not present

## 2017-09-26 DIAGNOSIS — E1129 Type 2 diabetes mellitus with other diabetic kidney complication: Secondary | ICD-10-CM | POA: Diagnosis not present

## 2017-09-26 DIAGNOSIS — N186 End stage renal disease: Secondary | ICD-10-CM | POA: Diagnosis not present

## 2017-09-28 DIAGNOSIS — Z7689 Persons encountering health services in other specified circumstances: Secondary | ICD-10-CM | POA: Diagnosis not present

## 2017-09-28 DIAGNOSIS — E1129 Type 2 diabetes mellitus with other diabetic kidney complication: Secondary | ICD-10-CM | POA: Diagnosis not present

## 2017-09-28 DIAGNOSIS — D689 Coagulation defect, unspecified: Secondary | ICD-10-CM | POA: Diagnosis not present

## 2017-09-28 DIAGNOSIS — N2581 Secondary hyperparathyroidism of renal origin: Secondary | ICD-10-CM | POA: Diagnosis not present

## 2017-09-28 DIAGNOSIS — N186 End stage renal disease: Secondary | ICD-10-CM | POA: Diagnosis not present

## 2017-09-28 DIAGNOSIS — D631 Anemia in chronic kidney disease: Secondary | ICD-10-CM | POA: Diagnosis not present

## 2017-10-01 DIAGNOSIS — Z7689 Persons encountering health services in other specified circumstances: Secondary | ICD-10-CM | POA: Diagnosis not present

## 2017-10-01 DIAGNOSIS — D689 Coagulation defect, unspecified: Secondary | ICD-10-CM | POA: Diagnosis not present

## 2017-10-01 DIAGNOSIS — D631 Anemia in chronic kidney disease: Secondary | ICD-10-CM | POA: Diagnosis not present

## 2017-10-01 DIAGNOSIS — N2581 Secondary hyperparathyroidism of renal origin: Secondary | ICD-10-CM | POA: Diagnosis not present

## 2017-10-01 DIAGNOSIS — N186 End stage renal disease: Secondary | ICD-10-CM | POA: Diagnosis not present

## 2017-10-01 DIAGNOSIS — E1129 Type 2 diabetes mellitus with other diabetic kidney complication: Secondary | ICD-10-CM | POA: Diagnosis not present

## 2017-10-02 ENCOUNTER — Ambulatory Visit (INDEPENDENT_AMBULATORY_CARE_PROVIDER_SITE_OTHER): Payer: Medicare Other

## 2017-10-02 ENCOUNTER — Ambulatory Visit (INDEPENDENT_AMBULATORY_CARE_PROVIDER_SITE_OTHER): Payer: Medicare Other | Admitting: Orthopedic Surgery

## 2017-10-02 ENCOUNTER — Encounter: Payer: Self-pay | Admitting: Orthopedic Surgery

## 2017-10-02 VITALS — BP 139/65 | HR 83 | Ht 64.0 in | Wt 227.0 lb

## 2017-10-02 DIAGNOSIS — M12811 Other specific arthropathies, not elsewhere classified, right shoulder: Secondary | ICD-10-CM | POA: Diagnosis not present

## 2017-10-02 DIAGNOSIS — M75101 Unspecified rotator cuff tear or rupture of right shoulder, not specified as traumatic: Secondary | ICD-10-CM | POA: Diagnosis not present

## 2017-10-02 DIAGNOSIS — Z7689 Persons encountering health services in other specified circumstances: Secondary | ICD-10-CM | POA: Diagnosis not present

## 2017-10-02 DIAGNOSIS — M25511 Pain in right shoulder: Secondary | ICD-10-CM

## 2017-10-02 NOTE — Progress Notes (Signed)
Patient ID: [name], female   DOB: [birth year], 84 y.o.   MRN: [id number]  Chief Complaint  Patient presents with  . Shoulder Pain    right shoulder pain after flu shot in October 2018    HPI [name] is a 84 y.o. female.  Presents for evaluation of right shoulder pain  Patient relates her recent onset of shoulder pain to her flu shot and perhaps getting off of a CT scan  She complains of dull constant mild to moderate pain over the right deltoid associated with weakness and decreased range of motion and inability to raise the arm over her head  Review of Systems Review of Systems  Constitutional: Negative for chills and fever.  Respiratory: Positive for shortness of breath.   Cardiovascular: Negative for chest pain.  Musculoskeletal: Positive for joint pain.    Past Medical History:  Diagnosis Date  . Anemia associated with chronic renal failure    ESA therapy  . Diabetic foot ulcers ([facility])   . ESRD on hemodialysis ([facility])   . Essential hypertension   . Headache   . Heart murmur   . History of blood transfusion 1975  . History of pneumonia 02/2011  . Hypothyroidism   . Osteoporosis   . Rheumatoid arthritis ([facility])   . Type 2 diabetes mellitus ([facility])   . Vaginal pessary present ~ 2012    Past Surgical History:  Procedure Laterality Date  . AV FISTULA PLACEMENT Left 01/26/2015   Procedure: LEFT ARM ARTERIOVENOUS (AV) FISTULA CREATION;  Surgeon: [name], MD;  Location: [facility] OR;  Service: Vascular;  Laterality: Left;  . CATARACT EXTRACTION W/ INTRAOCULAR LENS  IMPLANT, BILATERAL Bilateral 1990's  . COLONOSCOPY    . LIGATION OF COMPETING BRANCHES OF ARTERIOVENOUS FISTULA Left 06/01/2016   Procedure: LIGATION OF COMPETING BRANCHES OF BRACHIOCEPHALIC ARTERIOVENOUS FISTULA LEFT ARM;  Surgeon: Chuck Hint, MD;  Location: [facility] OR;  Service: Vascular;  Laterality: Left;  . ORIF FEMUR FRACTURE Left 1975   "got a rod and screw in it" (02/16/2013)  . PERIPHERAL  VASCULAR CATHETERIZATION Left 05/21/2016   Procedure: A/V Lucretia Kern;  Surgeon: Chuck Hint, MD;  Location: [facility] INVASIVE [facility] LAB;  Service: Cardiovascular;  Laterality: Left;  upper arm    Family History  Problem Relation Age of Onset  . Heart disease Mother        before age 89  . Diabetes Father   . Stroke Other   . Heart disease Other   . Kidney disease Other   . Cancer Daughter        cervical  . Heart disease Daughter        before age 44  . Cancer Grandchild        lymph nodes  . Heart disease Sister        before age 40  . Heart disease Brother        before age 40     Social History   Tobacco Use  . Smoking status: Never Smoker  . Smokeless tobacco: Never Used  Substance Use Topics  . Alcohol use: No    Alcohol/week: 0.0 oz  . Drug use: No    Allergies  Allergen Reactions  . Iodinated Diagnostic Agents Other (See Comments)    Tongue swelling & "felt like I was burning inside"  . Rena-Vite [Nephro-Vite] Itching and Other (See Comments)    Burning  . Lipitor [Atorvastatin] Other (See Comments)    UNSPECIFIED REACTION  Current Meds  Medication Sig  . ALPRAZolam (XANAX) 0.5 MG tablet Take 0.5 mg by mouth 3 (three) times daily as needed for anxiety.   . Cholecalciferol (VITAMIN D) 2000 units CAPS Take 1 capsule by mouth daily.  Marland Kitchen doxycycline (VIBRA-TABS) 100 MG tablet Take 100 mg by mouth 2 (two) times daily.  . fish oil-omega-3 fatty acids 1000 MG capsule Take 2 g by mouth daily.   Marland Kitchen levothyroxine (SYNTHROID, LEVOTHROID) 200 MCG tablet Take 1 tablet by mouth daily.  Marland Kitchen lidocaine-prilocaine (EMLA) cream Apply 1 application topically as needed.  . SENSIPAR 30 MG tablet Take 30 mg by mouth daily.   . sevelamer carbonate (RENVELA) 800 MG tablet Take 800 mg by mouth 2 (two) times daily.   Marland Kitchen UNABLE TO FIND Nephrovite DAW-one tab daily       Physical Exam BP 139/65   Pulse 83   Ht 5\' 4"  (1.626 m)   Wt 227 lb (103 kg)   BMI 38.96 kg/m   Physical Exam  Constitutional: She is oriented to person, place, and time. She appears well-developed and well-nourished.  Neurological: She is alert and oriented to person, place, and time.  Psychiatric: She has a normal mood and affect. Judgment normal.  Vitals reviewed.  Ambulatory Abnormal with walker required for gait   Right Shoulder Exam   Tests  Hawkins test: positive Impingement: positive Drop arm: positive Sulcus: absent  Other  Erythema: absent Scars: absent Sensation: normal Pulse: present   Left Shoulder Exam   Tenderness  The patient is experiencing no tenderness.   Range of Motion  Active abduction: abnormal  Passive abduction: abnormal  Forward flexion: abnormal   Muscle Strength  Abduction: 4/5  Internal rotation: 5/5  External rotation: 5/5  Supraspinatus: 4/5  Subscapularis: 5/5  Biceps: 5/5   Tests  Apprehension: negative Hawkins test: negative Cross arm: negative Impingement: negative Drop arm: negative Sulcus: absent  Other  Erythema: absent Scars: present Sensation: normal Pulse: present       Provocative tests drop arm test -positive Painful arc -0-90 degrees passive Empty can weakness in the supraspinatus Jobst test negative Infraspinatus muscle strength test External rotation lag sign positive Liftoff is negative Belly press test negative Supraspinatus motor exam 3 out of 5 weakness   Data Reviewed Imaging of the right shoulder are independently reviewed and I interpreted these as proximal migration of the humerus consistent with rotator cuff chronic tear  Assessment  Encounter Diagnosis  Name Primary?  . Acute pain of right shoulder Yes    Procedure note the subacromial injection shoulder RIGHT  Verbal consent was obtained to inject the  RIGHT   Shoulder  Timeout was completed to confirm the injection site is a subacromial space of the  RIGHT  shoulder   Medication used Depo-Medrol 40 mg and lidocaine  1% 3 cc  Anesthesia was provided by ethyl chloride  The injection was performed in the RIGHT  posterior subacromial space. After pinning the skin with alcohol and anesthetized the skin with ethyl chloride the subacromial space was injected using a 20-gauge needle. There were no complications  Sterile dressing was applied.    Plan  Subacromial injection

## 2017-10-03 DIAGNOSIS — I10 Essential (primary) hypertension: Secondary | ICD-10-CM | POA: Diagnosis not present

## 2017-10-03 DIAGNOSIS — E1129 Type 2 diabetes mellitus with other diabetic kidney complication: Secondary | ICD-10-CM | POA: Diagnosis not present

## 2017-10-03 DIAGNOSIS — N186 End stage renal disease: Secondary | ICD-10-CM | POA: Diagnosis not present

## 2017-10-03 DIAGNOSIS — D631 Anemia in chronic kidney disease: Secondary | ICD-10-CM | POA: Diagnosis not present

## 2017-10-03 DIAGNOSIS — D689 Coagulation defect, unspecified: Secondary | ICD-10-CM | POA: Diagnosis not present

## 2017-10-03 DIAGNOSIS — N2581 Secondary hyperparathyroidism of renal origin: Secondary | ICD-10-CM | POA: Diagnosis not present

## 2017-10-03 DIAGNOSIS — Z7689 Persons encountering health services in other specified circumstances: Secondary | ICD-10-CM | POA: Diagnosis not present

## 2017-10-05 DIAGNOSIS — E1129 Type 2 diabetes mellitus with other diabetic kidney complication: Secondary | ICD-10-CM | POA: Diagnosis not present

## 2017-10-05 DIAGNOSIS — Z7689 Persons encountering health services in other specified circumstances: Secondary | ICD-10-CM | POA: Diagnosis not present

## 2017-10-05 DIAGNOSIS — D689 Coagulation defect, unspecified: Secondary | ICD-10-CM | POA: Diagnosis not present

## 2017-10-05 DIAGNOSIS — D631 Anemia in chronic kidney disease: Secondary | ICD-10-CM | POA: Diagnosis not present

## 2017-10-05 DIAGNOSIS — N186 End stage renal disease: Secondary | ICD-10-CM | POA: Diagnosis not present

## 2017-10-05 DIAGNOSIS — N2581 Secondary hyperparathyroidism of renal origin: Secondary | ICD-10-CM | POA: Diagnosis not present

## 2017-10-08 DIAGNOSIS — N2581 Secondary hyperparathyroidism of renal origin: Secondary | ICD-10-CM | POA: Diagnosis not present

## 2017-10-08 DIAGNOSIS — E1129 Type 2 diabetes mellitus with other diabetic kidney complication: Secondary | ICD-10-CM | POA: Diagnosis not present

## 2017-10-08 DIAGNOSIS — N186 End stage renal disease: Secondary | ICD-10-CM | POA: Diagnosis not present

## 2017-10-08 DIAGNOSIS — D689 Coagulation defect, unspecified: Secondary | ICD-10-CM | POA: Diagnosis not present

## 2017-10-08 DIAGNOSIS — D631 Anemia in chronic kidney disease: Secondary | ICD-10-CM | POA: Diagnosis not present

## 2017-10-08 DIAGNOSIS — Z7689 Persons encountering health services in other specified circumstances: Secondary | ICD-10-CM | POA: Diagnosis not present

## 2017-10-10 DIAGNOSIS — N2581 Secondary hyperparathyroidism of renal origin: Secondary | ICD-10-CM | POA: Diagnosis not present

## 2017-10-10 DIAGNOSIS — D689 Coagulation defect, unspecified: Secondary | ICD-10-CM | POA: Diagnosis not present

## 2017-10-10 DIAGNOSIS — E1129 Type 2 diabetes mellitus with other diabetic kidney complication: Secondary | ICD-10-CM | POA: Diagnosis not present

## 2017-10-10 DIAGNOSIS — Z992 Dependence on renal dialysis: Secondary | ICD-10-CM | POA: Diagnosis not present

## 2017-10-10 DIAGNOSIS — Z7689 Persons encountering health services in other specified circumstances: Secondary | ICD-10-CM | POA: Diagnosis not present

## 2017-10-10 DIAGNOSIS — E1122 Type 2 diabetes mellitus with diabetic chronic kidney disease: Secondary | ICD-10-CM | POA: Diagnosis not present

## 2017-10-10 DIAGNOSIS — D631 Anemia in chronic kidney disease: Secondary | ICD-10-CM | POA: Diagnosis not present

## 2017-10-10 DIAGNOSIS — N186 End stage renal disease: Secondary | ICD-10-CM | POA: Diagnosis not present

## 2017-10-11 DIAGNOSIS — E1122 Type 2 diabetes mellitus with diabetic chronic kidney disease: Secondary | ICD-10-CM | POA: Diagnosis not present

## 2017-10-11 DIAGNOSIS — N186 End stage renal disease: Secondary | ICD-10-CM | POA: Diagnosis not present

## 2017-10-11 DIAGNOSIS — Z992 Dependence on renal dialysis: Secondary | ICD-10-CM | POA: Diagnosis not present

## 2017-10-12 DIAGNOSIS — D689 Coagulation defect, unspecified: Secondary | ICD-10-CM | POA: Diagnosis not present

## 2017-10-12 DIAGNOSIS — N186 End stage renal disease: Secondary | ICD-10-CM | POA: Diagnosis not present

## 2017-10-12 DIAGNOSIS — Z7689 Persons encountering health services in other specified circumstances: Secondary | ICD-10-CM | POA: Diagnosis not present

## 2017-10-12 DIAGNOSIS — D631 Anemia in chronic kidney disease: Secondary | ICD-10-CM | POA: Diagnosis not present

## 2017-10-12 DIAGNOSIS — E1129 Type 2 diabetes mellitus with other diabetic kidney complication: Secondary | ICD-10-CM | POA: Diagnosis not present

## 2017-10-12 DIAGNOSIS — N2581 Secondary hyperparathyroidism of renal origin: Secondary | ICD-10-CM | POA: Diagnosis not present

## 2017-10-12 DIAGNOSIS — D509 Iron deficiency anemia, unspecified: Secondary | ICD-10-CM | POA: Diagnosis not present

## 2017-10-14 DIAGNOSIS — N2581 Secondary hyperparathyroidism of renal origin: Secondary | ICD-10-CM | POA: Diagnosis not present

## 2017-10-14 DIAGNOSIS — D631 Anemia in chronic kidney disease: Secondary | ICD-10-CM | POA: Diagnosis not present

## 2017-10-14 DIAGNOSIS — N186 End stage renal disease: Secondary | ICD-10-CM | POA: Diagnosis not present

## 2017-10-14 DIAGNOSIS — D689 Coagulation defect, unspecified: Secondary | ICD-10-CM | POA: Diagnosis not present

## 2017-10-14 DIAGNOSIS — D509 Iron deficiency anemia, unspecified: Secondary | ICD-10-CM | POA: Diagnosis not present

## 2017-10-14 DIAGNOSIS — E1129 Type 2 diabetes mellitus with other diabetic kidney complication: Secondary | ICD-10-CM | POA: Diagnosis not present

## 2017-10-15 DIAGNOSIS — N2581 Secondary hyperparathyroidism of renal origin: Secondary | ICD-10-CM | POA: Diagnosis not present

## 2017-10-15 DIAGNOSIS — N186 End stage renal disease: Secondary | ICD-10-CM | POA: Diagnosis not present

## 2017-10-15 DIAGNOSIS — E1129 Type 2 diabetes mellitus with other diabetic kidney complication: Secondary | ICD-10-CM | POA: Diagnosis not present

## 2017-10-15 DIAGNOSIS — D689 Coagulation defect, unspecified: Secondary | ICD-10-CM | POA: Diagnosis not present

## 2017-10-15 DIAGNOSIS — D631 Anemia in chronic kidney disease: Secondary | ICD-10-CM | POA: Diagnosis not present

## 2017-10-15 DIAGNOSIS — D509 Iron deficiency anemia, unspecified: Secondary | ICD-10-CM | POA: Diagnosis not present

## 2017-10-15 DIAGNOSIS — Z7689 Persons encountering health services in other specified circumstances: Secondary | ICD-10-CM | POA: Diagnosis not present

## 2017-10-17 DIAGNOSIS — D631 Anemia in chronic kidney disease: Secondary | ICD-10-CM | POA: Diagnosis not present

## 2017-10-17 DIAGNOSIS — D689 Coagulation defect, unspecified: Secondary | ICD-10-CM | POA: Diagnosis not present

## 2017-10-17 DIAGNOSIS — Z7689 Persons encountering health services in other specified circumstances: Secondary | ICD-10-CM | POA: Diagnosis not present

## 2017-10-17 DIAGNOSIS — D509 Iron deficiency anemia, unspecified: Secondary | ICD-10-CM | POA: Diagnosis not present

## 2017-10-17 DIAGNOSIS — N186 End stage renal disease: Secondary | ICD-10-CM | POA: Diagnosis not present

## 2017-10-17 DIAGNOSIS — N2581 Secondary hyperparathyroidism of renal origin: Secondary | ICD-10-CM | POA: Diagnosis not present

## 2017-10-17 DIAGNOSIS — E1129 Type 2 diabetes mellitus with other diabetic kidney complication: Secondary | ICD-10-CM | POA: Diagnosis not present

## 2017-10-18 DIAGNOSIS — E08311 Diabetes mellitus due to underlying condition with unspecified diabetic retinopathy with macular edema: Secondary | ICD-10-CM | POA: Diagnosis not present

## 2017-10-19 DIAGNOSIS — Z7689 Persons encountering health services in other specified circumstances: Secondary | ICD-10-CM | POA: Diagnosis not present

## 2017-10-19 DIAGNOSIS — D631 Anemia in chronic kidney disease: Secondary | ICD-10-CM | POA: Diagnosis not present

## 2017-10-19 DIAGNOSIS — D509 Iron deficiency anemia, unspecified: Secondary | ICD-10-CM | POA: Diagnosis not present

## 2017-10-19 DIAGNOSIS — D689 Coagulation defect, unspecified: Secondary | ICD-10-CM | POA: Diagnosis not present

## 2017-10-19 DIAGNOSIS — N2581 Secondary hyperparathyroidism of renal origin: Secondary | ICD-10-CM | POA: Diagnosis not present

## 2017-10-19 DIAGNOSIS — N186 End stage renal disease: Secondary | ICD-10-CM | POA: Diagnosis not present

## 2017-10-19 DIAGNOSIS — E1129 Type 2 diabetes mellitus with other diabetic kidney complication: Secondary | ICD-10-CM | POA: Diagnosis not present

## 2017-10-22 DIAGNOSIS — N2581 Secondary hyperparathyroidism of renal origin: Secondary | ICD-10-CM | POA: Diagnosis not present

## 2017-10-22 DIAGNOSIS — Z7689 Persons encountering health services in other specified circumstances: Secondary | ICD-10-CM | POA: Diagnosis not present

## 2017-10-22 DIAGNOSIS — D631 Anemia in chronic kidney disease: Secondary | ICD-10-CM | POA: Diagnosis not present

## 2017-10-22 DIAGNOSIS — N186 End stage renal disease: Secondary | ICD-10-CM | POA: Diagnosis not present

## 2017-10-22 DIAGNOSIS — D509 Iron deficiency anemia, unspecified: Secondary | ICD-10-CM | POA: Diagnosis not present

## 2017-10-22 DIAGNOSIS — E1129 Type 2 diabetes mellitus with other diabetic kidney complication: Secondary | ICD-10-CM | POA: Diagnosis not present

## 2017-10-22 DIAGNOSIS — D689 Coagulation defect, unspecified: Secondary | ICD-10-CM | POA: Diagnosis not present

## 2017-10-24 DIAGNOSIS — N186 End stage renal disease: Secondary | ICD-10-CM | POA: Diagnosis not present

## 2017-10-24 DIAGNOSIS — Z7689 Persons encountering health services in other specified circumstances: Secondary | ICD-10-CM | POA: Diagnosis not present

## 2017-10-24 DIAGNOSIS — D689 Coagulation defect, unspecified: Secondary | ICD-10-CM | POA: Diagnosis not present

## 2017-10-24 DIAGNOSIS — D631 Anemia in chronic kidney disease: Secondary | ICD-10-CM | POA: Diagnosis not present

## 2017-10-24 DIAGNOSIS — D509 Iron deficiency anemia, unspecified: Secondary | ICD-10-CM | POA: Diagnosis not present

## 2017-10-24 DIAGNOSIS — N2581 Secondary hyperparathyroidism of renal origin: Secondary | ICD-10-CM | POA: Diagnosis not present

## 2017-10-24 DIAGNOSIS — E1129 Type 2 diabetes mellitus with other diabetic kidney complication: Secondary | ICD-10-CM | POA: Diagnosis not present

## 2017-10-26 DIAGNOSIS — Z7689 Persons encountering health services in other specified circumstances: Secondary | ICD-10-CM | POA: Diagnosis not present

## 2017-10-26 DIAGNOSIS — N2581 Secondary hyperparathyroidism of renal origin: Secondary | ICD-10-CM | POA: Diagnosis not present

## 2017-10-26 DIAGNOSIS — D631 Anemia in chronic kidney disease: Secondary | ICD-10-CM | POA: Diagnosis not present

## 2017-10-26 DIAGNOSIS — D509 Iron deficiency anemia, unspecified: Secondary | ICD-10-CM | POA: Diagnosis not present

## 2017-10-26 DIAGNOSIS — N186 End stage renal disease: Secondary | ICD-10-CM | POA: Diagnosis not present

## 2017-10-26 DIAGNOSIS — E1129 Type 2 diabetes mellitus with other diabetic kidney complication: Secondary | ICD-10-CM | POA: Diagnosis not present

## 2017-10-26 DIAGNOSIS — D689 Coagulation defect, unspecified: Secondary | ICD-10-CM | POA: Diagnosis not present

## 2017-10-29 DIAGNOSIS — N2581 Secondary hyperparathyroidism of renal origin: Secondary | ICD-10-CM | POA: Diagnosis not present

## 2017-10-29 DIAGNOSIS — D509 Iron deficiency anemia, unspecified: Secondary | ICD-10-CM | POA: Diagnosis not present

## 2017-10-29 DIAGNOSIS — E1129 Type 2 diabetes mellitus with other diabetic kidney complication: Secondary | ICD-10-CM | POA: Diagnosis not present

## 2017-10-29 DIAGNOSIS — D689 Coagulation defect, unspecified: Secondary | ICD-10-CM | POA: Diagnosis not present

## 2017-10-29 DIAGNOSIS — Z7689 Persons encountering health services in other specified circumstances: Secondary | ICD-10-CM | POA: Diagnosis not present

## 2017-10-29 DIAGNOSIS — D631 Anemia in chronic kidney disease: Secondary | ICD-10-CM | POA: Diagnosis not present

## 2017-10-29 DIAGNOSIS — N186 End stage renal disease: Secondary | ICD-10-CM | POA: Diagnosis not present

## 2017-10-31 DIAGNOSIS — D509 Iron deficiency anemia, unspecified: Secondary | ICD-10-CM | POA: Diagnosis not present

## 2017-10-31 DIAGNOSIS — N2581 Secondary hyperparathyroidism of renal origin: Secondary | ICD-10-CM | POA: Diagnosis not present

## 2017-10-31 DIAGNOSIS — D631 Anemia in chronic kidney disease: Secondary | ICD-10-CM | POA: Diagnosis not present

## 2017-10-31 DIAGNOSIS — D689 Coagulation defect, unspecified: Secondary | ICD-10-CM | POA: Diagnosis not present

## 2017-10-31 DIAGNOSIS — Z7689 Persons encountering health services in other specified circumstances: Secondary | ICD-10-CM | POA: Diagnosis not present

## 2017-10-31 DIAGNOSIS — N186 End stage renal disease: Secondary | ICD-10-CM | POA: Diagnosis not present

## 2017-10-31 DIAGNOSIS — E1129 Type 2 diabetes mellitus with other diabetic kidney complication: Secondary | ICD-10-CM | POA: Diagnosis not present

## 2017-11-02 DIAGNOSIS — D509 Iron deficiency anemia, unspecified: Secondary | ICD-10-CM | POA: Diagnosis not present

## 2017-11-02 DIAGNOSIS — D631 Anemia in chronic kidney disease: Secondary | ICD-10-CM | POA: Diagnosis not present

## 2017-11-02 DIAGNOSIS — N186 End stage renal disease: Secondary | ICD-10-CM | POA: Diagnosis not present

## 2017-11-02 DIAGNOSIS — N2581 Secondary hyperparathyroidism of renal origin: Secondary | ICD-10-CM | POA: Diagnosis not present

## 2017-11-02 DIAGNOSIS — D689 Coagulation defect, unspecified: Secondary | ICD-10-CM | POA: Diagnosis not present

## 2017-11-02 DIAGNOSIS — E1129 Type 2 diabetes mellitus with other diabetic kidney complication: Secondary | ICD-10-CM | POA: Diagnosis not present

## 2017-11-02 DIAGNOSIS — Z7689 Persons encountering health services in other specified circumstances: Secondary | ICD-10-CM | POA: Diagnosis not present

## 2017-11-05 DIAGNOSIS — N2581 Secondary hyperparathyroidism of renal origin: Secondary | ICD-10-CM | POA: Diagnosis not present

## 2017-11-05 DIAGNOSIS — Z7689 Persons encountering health services in other specified circumstances: Secondary | ICD-10-CM | POA: Diagnosis not present

## 2017-11-05 DIAGNOSIS — D631 Anemia in chronic kidney disease: Secondary | ICD-10-CM | POA: Diagnosis not present

## 2017-11-05 DIAGNOSIS — N186 End stage renal disease: Secondary | ICD-10-CM | POA: Diagnosis not present

## 2017-11-05 DIAGNOSIS — D509 Iron deficiency anemia, unspecified: Secondary | ICD-10-CM | POA: Diagnosis not present

## 2017-11-05 DIAGNOSIS — D689 Coagulation defect, unspecified: Secondary | ICD-10-CM | POA: Diagnosis not present

## 2017-11-05 DIAGNOSIS — E1129 Type 2 diabetes mellitus with other diabetic kidney complication: Secondary | ICD-10-CM | POA: Diagnosis not present

## 2017-11-07 DIAGNOSIS — D689 Coagulation defect, unspecified: Secondary | ICD-10-CM | POA: Diagnosis not present

## 2017-11-07 DIAGNOSIS — N186 End stage renal disease: Secondary | ICD-10-CM | POA: Diagnosis not present

## 2017-11-07 DIAGNOSIS — N2581 Secondary hyperparathyroidism of renal origin: Secondary | ICD-10-CM | POA: Diagnosis not present

## 2017-11-07 DIAGNOSIS — D631 Anemia in chronic kidney disease: Secondary | ICD-10-CM | POA: Diagnosis not present

## 2017-11-07 DIAGNOSIS — D509 Iron deficiency anemia, unspecified: Secondary | ICD-10-CM | POA: Diagnosis not present

## 2017-11-07 DIAGNOSIS — E1129 Type 2 diabetes mellitus with other diabetic kidney complication: Secondary | ICD-10-CM | POA: Diagnosis not present

## 2017-11-08 DIAGNOSIS — Z992 Dependence on renal dialysis: Secondary | ICD-10-CM | POA: Diagnosis not present

## 2017-11-08 DIAGNOSIS — E1122 Type 2 diabetes mellitus with diabetic chronic kidney disease: Secondary | ICD-10-CM | POA: Diagnosis not present

## 2017-11-08 DIAGNOSIS — N186 End stage renal disease: Secondary | ICD-10-CM | POA: Diagnosis not present

## 2017-11-09 DIAGNOSIS — D689 Coagulation defect, unspecified: Secondary | ICD-10-CM | POA: Diagnosis not present

## 2017-11-09 DIAGNOSIS — N2581 Secondary hyperparathyroidism of renal origin: Secondary | ICD-10-CM | POA: Diagnosis not present

## 2017-11-09 DIAGNOSIS — D631 Anemia in chronic kidney disease: Secondary | ICD-10-CM | POA: Diagnosis not present

## 2017-11-09 DIAGNOSIS — N186 End stage renal disease: Secondary | ICD-10-CM | POA: Diagnosis not present

## 2017-11-09 DIAGNOSIS — E1129 Type 2 diabetes mellitus with other diabetic kidney complication: Secondary | ICD-10-CM | POA: Diagnosis not present

## 2017-11-09 DIAGNOSIS — Z7689 Persons encountering health services in other specified circumstances: Secondary | ICD-10-CM | POA: Diagnosis not present

## 2017-11-12 DIAGNOSIS — D689 Coagulation defect, unspecified: Secondary | ICD-10-CM | POA: Diagnosis not present

## 2017-11-12 DIAGNOSIS — Z7689 Persons encountering health services in other specified circumstances: Secondary | ICD-10-CM | POA: Diagnosis not present

## 2017-11-12 DIAGNOSIS — E1129 Type 2 diabetes mellitus with other diabetic kidney complication: Secondary | ICD-10-CM | POA: Diagnosis not present

## 2017-11-12 DIAGNOSIS — N186 End stage renal disease: Secondary | ICD-10-CM | POA: Diagnosis not present

## 2017-11-12 DIAGNOSIS — D631 Anemia in chronic kidney disease: Secondary | ICD-10-CM | POA: Diagnosis not present

## 2017-11-12 DIAGNOSIS — N2581 Secondary hyperparathyroidism of renal origin: Secondary | ICD-10-CM | POA: Diagnosis not present

## 2017-11-13 ENCOUNTER — Encounter (INDEPENDENT_AMBULATORY_CARE_PROVIDER_SITE_OTHER): Payer: Medicare Other | Admitting: Ophthalmology

## 2017-11-13 DIAGNOSIS — I10 Essential (primary) hypertension: Secondary | ICD-10-CM | POA: Diagnosis not present

## 2017-11-13 DIAGNOSIS — H35372 Puckering of macula, left eye: Secondary | ICD-10-CM | POA: Diagnosis not present

## 2017-11-13 DIAGNOSIS — E113391 Type 2 diabetes mellitus with moderate nonproliferative diabetic retinopathy without macular edema, right eye: Secondary | ICD-10-CM | POA: Diagnosis not present

## 2017-11-13 DIAGNOSIS — H43813 Vitreous degeneration, bilateral: Secondary | ICD-10-CM | POA: Diagnosis not present

## 2017-11-13 DIAGNOSIS — H35033 Hypertensive retinopathy, bilateral: Secondary | ICD-10-CM

## 2017-11-13 DIAGNOSIS — E11311 Type 2 diabetes mellitus with unspecified diabetic retinopathy with macular edema: Secondary | ICD-10-CM | POA: Diagnosis not present

## 2017-11-13 DIAGNOSIS — E1129 Type 2 diabetes mellitus with other diabetic kidney complication: Secondary | ICD-10-CM | POA: Diagnosis not present

## 2017-11-13 DIAGNOSIS — E113512 Type 2 diabetes mellitus with proliferative diabetic retinopathy with macular edema, left eye: Secondary | ICD-10-CM | POA: Diagnosis not present

## 2017-11-14 DIAGNOSIS — N2581 Secondary hyperparathyroidism of renal origin: Secondary | ICD-10-CM | POA: Diagnosis not present

## 2017-11-14 DIAGNOSIS — N186 End stage renal disease: Secondary | ICD-10-CM | POA: Diagnosis not present

## 2017-11-14 DIAGNOSIS — Z7689 Persons encountering health services in other specified circumstances: Secondary | ICD-10-CM | POA: Diagnosis not present

## 2017-11-14 DIAGNOSIS — D689 Coagulation defect, unspecified: Secondary | ICD-10-CM | POA: Diagnosis not present

## 2017-11-14 DIAGNOSIS — D631 Anemia in chronic kidney disease: Secondary | ICD-10-CM | POA: Diagnosis not present

## 2017-11-14 DIAGNOSIS — E1129 Type 2 diabetes mellitus with other diabetic kidney complication: Secondary | ICD-10-CM | POA: Diagnosis not present

## 2017-11-16 DIAGNOSIS — Z7689 Persons encountering health services in other specified circumstances: Secondary | ICD-10-CM | POA: Diagnosis not present

## 2017-11-16 DIAGNOSIS — D631 Anemia in chronic kidney disease: Secondary | ICD-10-CM | POA: Diagnosis not present

## 2017-11-16 DIAGNOSIS — E1129 Type 2 diabetes mellitus with other diabetic kidney complication: Secondary | ICD-10-CM | POA: Diagnosis not present

## 2017-11-16 DIAGNOSIS — D689 Coagulation defect, unspecified: Secondary | ICD-10-CM | POA: Diagnosis not present

## 2017-11-16 DIAGNOSIS — N2581 Secondary hyperparathyroidism of renal origin: Secondary | ICD-10-CM | POA: Diagnosis not present

## 2017-11-16 DIAGNOSIS — N186 End stage renal disease: Secondary | ICD-10-CM | POA: Diagnosis not present

## 2017-11-19 DIAGNOSIS — E1129 Type 2 diabetes mellitus with other diabetic kidney complication: Secondary | ICD-10-CM | POA: Diagnosis not present

## 2017-11-19 DIAGNOSIS — D689 Coagulation defect, unspecified: Secondary | ICD-10-CM | POA: Diagnosis not present

## 2017-11-19 DIAGNOSIS — D631 Anemia in chronic kidney disease: Secondary | ICD-10-CM | POA: Diagnosis not present

## 2017-11-19 DIAGNOSIS — N2581 Secondary hyperparathyroidism of renal origin: Secondary | ICD-10-CM | POA: Diagnosis not present

## 2017-11-19 DIAGNOSIS — Z7689 Persons encountering health services in other specified circumstances: Secondary | ICD-10-CM | POA: Diagnosis not present

## 2017-11-19 DIAGNOSIS — N186 End stage renal disease: Secondary | ICD-10-CM | POA: Diagnosis not present

## 2017-11-21 DIAGNOSIS — D631 Anemia in chronic kidney disease: Secondary | ICD-10-CM | POA: Diagnosis not present

## 2017-11-21 DIAGNOSIS — D689 Coagulation defect, unspecified: Secondary | ICD-10-CM | POA: Diagnosis not present

## 2017-11-21 DIAGNOSIS — E1129 Type 2 diabetes mellitus with other diabetic kidney complication: Secondary | ICD-10-CM | POA: Diagnosis not present

## 2017-11-21 DIAGNOSIS — Z7689 Persons encountering health services in other specified circumstances: Secondary | ICD-10-CM | POA: Diagnosis not present

## 2017-11-21 DIAGNOSIS — N2581 Secondary hyperparathyroidism of renal origin: Secondary | ICD-10-CM | POA: Diagnosis not present

## 2017-11-21 DIAGNOSIS — N186 End stage renal disease: Secondary | ICD-10-CM | POA: Diagnosis not present

## 2017-11-23 DIAGNOSIS — D689 Coagulation defect, unspecified: Secondary | ICD-10-CM | POA: Diagnosis not present

## 2017-11-23 DIAGNOSIS — N2581 Secondary hyperparathyroidism of renal origin: Secondary | ICD-10-CM | POA: Diagnosis not present

## 2017-11-23 DIAGNOSIS — Z7689 Persons encountering health services in other specified circumstances: Secondary | ICD-10-CM | POA: Diagnosis not present

## 2017-11-23 DIAGNOSIS — E1129 Type 2 diabetes mellitus with other diabetic kidney complication: Secondary | ICD-10-CM | POA: Diagnosis not present

## 2017-11-23 DIAGNOSIS — D631 Anemia in chronic kidney disease: Secondary | ICD-10-CM | POA: Diagnosis not present

## 2017-11-23 DIAGNOSIS — N186 End stage renal disease: Secondary | ICD-10-CM | POA: Diagnosis not present

## 2017-11-26 DIAGNOSIS — D631 Anemia in chronic kidney disease: Secondary | ICD-10-CM | POA: Diagnosis not present

## 2017-11-26 DIAGNOSIS — Z7689 Persons encountering health services in other specified circumstances: Secondary | ICD-10-CM | POA: Diagnosis not present

## 2017-11-26 DIAGNOSIS — D689 Coagulation defect, unspecified: Secondary | ICD-10-CM | POA: Diagnosis not present

## 2017-11-26 DIAGNOSIS — N2581 Secondary hyperparathyroidism of renal origin: Secondary | ICD-10-CM | POA: Diagnosis not present

## 2017-11-26 DIAGNOSIS — N186 End stage renal disease: Secondary | ICD-10-CM | POA: Diagnosis not present

## 2017-11-26 DIAGNOSIS — E1129 Type 2 diabetes mellitus with other diabetic kidney complication: Secondary | ICD-10-CM | POA: Diagnosis not present

## 2017-11-28 DIAGNOSIS — E1129 Type 2 diabetes mellitus with other diabetic kidney complication: Secondary | ICD-10-CM | POA: Diagnosis not present

## 2017-11-28 DIAGNOSIS — N2581 Secondary hyperparathyroidism of renal origin: Secondary | ICD-10-CM | POA: Diagnosis not present

## 2017-11-28 DIAGNOSIS — Z7689 Persons encountering health services in other specified circumstances: Secondary | ICD-10-CM | POA: Diagnosis not present

## 2017-11-28 DIAGNOSIS — N186 End stage renal disease: Secondary | ICD-10-CM | POA: Diagnosis not present

## 2017-11-28 DIAGNOSIS — D689 Coagulation defect, unspecified: Secondary | ICD-10-CM | POA: Diagnosis not present

## 2017-11-28 DIAGNOSIS — D631 Anemia in chronic kidney disease: Secondary | ICD-10-CM | POA: Diagnosis not present

## 2017-11-29 DIAGNOSIS — I8310 Varicose veins of unspecified lower extremity with inflammation: Secondary | ICD-10-CM | POA: Diagnosis not present

## 2017-11-29 DIAGNOSIS — R600 Localized edema: Secondary | ICD-10-CM | POA: Diagnosis not present

## 2017-11-29 DIAGNOSIS — Z7689 Persons encountering health services in other specified circumstances: Secondary | ICD-10-CM | POA: Diagnosis not present

## 2017-11-29 DIAGNOSIS — I872 Venous insufficiency (chronic) (peripheral): Secondary | ICD-10-CM | POA: Diagnosis not present

## 2017-11-29 DIAGNOSIS — M779 Enthesopathy, unspecified: Secondary | ICD-10-CM | POA: Diagnosis not present

## 2017-11-29 DIAGNOSIS — M1991 Primary osteoarthritis, unspecified site: Secondary | ICD-10-CM | POA: Diagnosis not present

## 2017-11-29 DIAGNOSIS — E1129 Type 2 diabetes mellitus with other diabetic kidney complication: Secondary | ICD-10-CM | POA: Diagnosis not present

## 2017-11-29 DIAGNOSIS — L84 Corns and callosities: Secondary | ICD-10-CM | POA: Diagnosis not present

## 2017-11-30 DIAGNOSIS — D631 Anemia in chronic kidney disease: Secondary | ICD-10-CM | POA: Diagnosis not present

## 2017-11-30 DIAGNOSIS — N186 End stage renal disease: Secondary | ICD-10-CM | POA: Diagnosis not present

## 2017-11-30 DIAGNOSIS — E1129 Type 2 diabetes mellitus with other diabetic kidney complication: Secondary | ICD-10-CM | POA: Diagnosis not present

## 2017-11-30 DIAGNOSIS — Z7689 Persons encountering health services in other specified circumstances: Secondary | ICD-10-CM | POA: Diagnosis not present

## 2017-11-30 DIAGNOSIS — N2581 Secondary hyperparathyroidism of renal origin: Secondary | ICD-10-CM | POA: Diagnosis not present

## 2017-11-30 DIAGNOSIS — D689 Coagulation defect, unspecified: Secondary | ICD-10-CM | POA: Diagnosis not present

## 2017-12-01 DIAGNOSIS — I10 Essential (primary) hypertension: Secondary | ICD-10-CM | POA: Diagnosis not present

## 2017-12-03 DIAGNOSIS — D631 Anemia in chronic kidney disease: Secondary | ICD-10-CM | POA: Diagnosis not present

## 2017-12-03 DIAGNOSIS — N2581 Secondary hyperparathyroidism of renal origin: Secondary | ICD-10-CM | POA: Diagnosis not present

## 2017-12-03 DIAGNOSIS — E1129 Type 2 diabetes mellitus with other diabetic kidney complication: Secondary | ICD-10-CM | POA: Diagnosis not present

## 2017-12-03 DIAGNOSIS — D689 Coagulation defect, unspecified: Secondary | ICD-10-CM | POA: Diagnosis not present

## 2017-12-03 DIAGNOSIS — N186 End stage renal disease: Secondary | ICD-10-CM | POA: Diagnosis not present

## 2017-12-03 DIAGNOSIS — Z7689 Persons encountering health services in other specified circumstances: Secondary | ICD-10-CM | POA: Diagnosis not present

## 2017-12-05 DIAGNOSIS — E1129 Type 2 diabetes mellitus with other diabetic kidney complication: Secondary | ICD-10-CM | POA: Diagnosis not present

## 2017-12-05 DIAGNOSIS — D689 Coagulation defect, unspecified: Secondary | ICD-10-CM | POA: Diagnosis not present

## 2017-12-05 DIAGNOSIS — Z7689 Persons encountering health services in other specified circumstances: Secondary | ICD-10-CM | POA: Diagnosis not present

## 2017-12-05 DIAGNOSIS — N186 End stage renal disease: Secondary | ICD-10-CM | POA: Diagnosis not present

## 2017-12-05 DIAGNOSIS — N2581 Secondary hyperparathyroidism of renal origin: Secondary | ICD-10-CM | POA: Diagnosis not present

## 2017-12-05 DIAGNOSIS — D631 Anemia in chronic kidney disease: Secondary | ICD-10-CM | POA: Diagnosis not present

## 2017-12-07 DIAGNOSIS — N2581 Secondary hyperparathyroidism of renal origin: Secondary | ICD-10-CM | POA: Diagnosis not present

## 2017-12-07 DIAGNOSIS — D689 Coagulation defect, unspecified: Secondary | ICD-10-CM | POA: Diagnosis not present

## 2017-12-07 DIAGNOSIS — Z7689 Persons encountering health services in other specified circumstances: Secondary | ICD-10-CM | POA: Diagnosis not present

## 2017-12-07 DIAGNOSIS — E1129 Type 2 diabetes mellitus with other diabetic kidney complication: Secondary | ICD-10-CM | POA: Diagnosis not present

## 2017-12-07 DIAGNOSIS — N186 End stage renal disease: Secondary | ICD-10-CM | POA: Diagnosis not present

## 2017-12-07 DIAGNOSIS — D631 Anemia in chronic kidney disease: Secondary | ICD-10-CM | POA: Diagnosis not present

## 2017-12-10 DIAGNOSIS — D689 Coagulation defect, unspecified: Secondary | ICD-10-CM | POA: Diagnosis not present

## 2017-12-10 DIAGNOSIS — N2581 Secondary hyperparathyroidism of renal origin: Secondary | ICD-10-CM | POA: Diagnosis not present

## 2017-12-10 DIAGNOSIS — D631 Anemia in chronic kidney disease: Secondary | ICD-10-CM | POA: Diagnosis not present

## 2017-12-10 DIAGNOSIS — E1129 Type 2 diabetes mellitus with other diabetic kidney complication: Secondary | ICD-10-CM | POA: Diagnosis not present

## 2017-12-10 DIAGNOSIS — N186 End stage renal disease: Secondary | ICD-10-CM | POA: Diagnosis not present

## 2017-12-12 DIAGNOSIS — N186 End stage renal disease: Secondary | ICD-10-CM | POA: Diagnosis not present

## 2017-12-12 DIAGNOSIS — D631 Anemia in chronic kidney disease: Secondary | ICD-10-CM | POA: Diagnosis not present

## 2017-12-12 DIAGNOSIS — D689 Coagulation defect, unspecified: Secondary | ICD-10-CM | POA: Diagnosis not present

## 2017-12-12 DIAGNOSIS — E1129 Type 2 diabetes mellitus with other diabetic kidney complication: Secondary | ICD-10-CM | POA: Diagnosis not present

## 2017-12-12 DIAGNOSIS — Z7689 Persons encountering health services in other specified circumstances: Secondary | ICD-10-CM | POA: Diagnosis not present

## 2017-12-12 DIAGNOSIS — N2581 Secondary hyperparathyroidism of renal origin: Secondary | ICD-10-CM | POA: Diagnosis not present

## 2017-12-14 DIAGNOSIS — N2581 Secondary hyperparathyroidism of renal origin: Secondary | ICD-10-CM | POA: Diagnosis not present

## 2017-12-14 DIAGNOSIS — N186 End stage renal disease: Secondary | ICD-10-CM | POA: Diagnosis not present

## 2017-12-14 DIAGNOSIS — D631 Anemia in chronic kidney disease: Secondary | ICD-10-CM | POA: Diagnosis not present

## 2017-12-14 DIAGNOSIS — Z7689 Persons encountering health services in other specified circumstances: Secondary | ICD-10-CM | POA: Diagnosis not present

## 2017-12-14 DIAGNOSIS — D689 Coagulation defect, unspecified: Secondary | ICD-10-CM | POA: Diagnosis not present

## 2017-12-14 DIAGNOSIS — E1129 Type 2 diabetes mellitus with other diabetic kidney complication: Secondary | ICD-10-CM | POA: Diagnosis not present

## 2017-12-17 DIAGNOSIS — E1129 Type 2 diabetes mellitus with other diabetic kidney complication: Secondary | ICD-10-CM | POA: Diagnosis not present

## 2017-12-17 DIAGNOSIS — D631 Anemia in chronic kidney disease: Secondary | ICD-10-CM | POA: Diagnosis not present

## 2017-12-17 DIAGNOSIS — N186 End stage renal disease: Secondary | ICD-10-CM | POA: Diagnosis not present

## 2017-12-17 DIAGNOSIS — N2581 Secondary hyperparathyroidism of renal origin: Secondary | ICD-10-CM | POA: Diagnosis not present

## 2017-12-17 DIAGNOSIS — Z7689 Persons encountering health services in other specified circumstances: Secondary | ICD-10-CM | POA: Diagnosis not present

## 2017-12-17 DIAGNOSIS — D689 Coagulation defect, unspecified: Secondary | ICD-10-CM | POA: Diagnosis not present

## 2017-12-19 DIAGNOSIS — E1129 Type 2 diabetes mellitus with other diabetic kidney complication: Secondary | ICD-10-CM | POA: Diagnosis not present

## 2017-12-19 DIAGNOSIS — D631 Anemia in chronic kidney disease: Secondary | ICD-10-CM | POA: Diagnosis not present

## 2017-12-19 DIAGNOSIS — N186 End stage renal disease: Secondary | ICD-10-CM | POA: Diagnosis not present

## 2017-12-19 DIAGNOSIS — N2581 Secondary hyperparathyroidism of renal origin: Secondary | ICD-10-CM | POA: Diagnosis not present

## 2017-12-19 DIAGNOSIS — D689 Coagulation defect, unspecified: Secondary | ICD-10-CM | POA: Diagnosis not present

## 2017-12-19 DIAGNOSIS — Z7689 Persons encountering health services in other specified circumstances: Secondary | ICD-10-CM | POA: Diagnosis not present

## 2017-12-21 DIAGNOSIS — D689 Coagulation defect, unspecified: Secondary | ICD-10-CM | POA: Diagnosis not present

## 2017-12-21 DIAGNOSIS — Z7689 Persons encountering health services in other specified circumstances: Secondary | ICD-10-CM | POA: Diagnosis not present

## 2017-12-21 DIAGNOSIS — E1129 Type 2 diabetes mellitus with other diabetic kidney complication: Secondary | ICD-10-CM | POA: Diagnosis not present

## 2017-12-21 DIAGNOSIS — N186 End stage renal disease: Secondary | ICD-10-CM | POA: Diagnosis not present

## 2017-12-21 DIAGNOSIS — D631 Anemia in chronic kidney disease: Secondary | ICD-10-CM | POA: Diagnosis not present

## 2017-12-21 DIAGNOSIS — N2581 Secondary hyperparathyroidism of renal origin: Secondary | ICD-10-CM | POA: Diagnosis not present

## 2017-12-24 DIAGNOSIS — D689 Coagulation defect, unspecified: Secondary | ICD-10-CM | POA: Diagnosis not present

## 2017-12-24 DIAGNOSIS — Z7689 Persons encountering health services in other specified circumstances: Secondary | ICD-10-CM | POA: Diagnosis not present

## 2017-12-24 DIAGNOSIS — E1129 Type 2 diabetes mellitus with other diabetic kidney complication: Secondary | ICD-10-CM | POA: Diagnosis not present

## 2017-12-24 DIAGNOSIS — N186 End stage renal disease: Secondary | ICD-10-CM | POA: Diagnosis not present

## 2017-12-24 DIAGNOSIS — N2581 Secondary hyperparathyroidism of renal origin: Secondary | ICD-10-CM | POA: Diagnosis not present

## 2017-12-24 DIAGNOSIS — D631 Anemia in chronic kidney disease: Secondary | ICD-10-CM | POA: Diagnosis not present

## 2017-12-26 DIAGNOSIS — E1129 Type 2 diabetes mellitus with other diabetic kidney complication: Secondary | ICD-10-CM | POA: Diagnosis not present

## 2017-12-26 DIAGNOSIS — N186 End stage renal disease: Secondary | ICD-10-CM | POA: Diagnosis not present

## 2017-12-26 DIAGNOSIS — Z7689 Persons encountering health services in other specified circumstances: Secondary | ICD-10-CM | POA: Diagnosis not present

## 2017-12-26 DIAGNOSIS — D689 Coagulation defect, unspecified: Secondary | ICD-10-CM | POA: Diagnosis not present

## 2017-12-26 DIAGNOSIS — N2581 Secondary hyperparathyroidism of renal origin: Secondary | ICD-10-CM | POA: Diagnosis not present

## 2017-12-26 DIAGNOSIS — D631 Anemia in chronic kidney disease: Secondary | ICD-10-CM | POA: Diagnosis not present

## 2017-12-28 DIAGNOSIS — D631 Anemia in chronic kidney disease: Secondary | ICD-10-CM | POA: Diagnosis not present

## 2017-12-28 DIAGNOSIS — N2581 Secondary hyperparathyroidism of renal origin: Secondary | ICD-10-CM | POA: Diagnosis not present

## 2017-12-28 DIAGNOSIS — E1129 Type 2 diabetes mellitus with other diabetic kidney complication: Secondary | ICD-10-CM | POA: Diagnosis not present

## 2017-12-28 DIAGNOSIS — N186 End stage renal disease: Secondary | ICD-10-CM | POA: Diagnosis not present

## 2017-12-28 DIAGNOSIS — Z7689 Persons encountering health services in other specified circumstances: Secondary | ICD-10-CM | POA: Diagnosis not present

## 2017-12-28 DIAGNOSIS — D689 Coagulation defect, unspecified: Secondary | ICD-10-CM | POA: Diagnosis not present

## 2017-12-31 DIAGNOSIS — D689 Coagulation defect, unspecified: Secondary | ICD-10-CM | POA: Diagnosis not present

## 2017-12-31 DIAGNOSIS — N186 End stage renal disease: Secondary | ICD-10-CM | POA: Diagnosis not present

## 2017-12-31 DIAGNOSIS — E1129 Type 2 diabetes mellitus with other diabetic kidney complication: Secondary | ICD-10-CM | POA: Diagnosis not present

## 2017-12-31 DIAGNOSIS — N2581 Secondary hyperparathyroidism of renal origin: Secondary | ICD-10-CM | POA: Diagnosis not present

## 2017-12-31 DIAGNOSIS — D631 Anemia in chronic kidney disease: Secondary | ICD-10-CM | POA: Diagnosis not present

## 2017-12-31 DIAGNOSIS — Z7689 Persons encountering health services in other specified circumstances: Secondary | ICD-10-CM | POA: Diagnosis not present

## 2018-01-01 DIAGNOSIS — I10 Essential (primary) hypertension: Secondary | ICD-10-CM | POA: Diagnosis not present

## 2018-01-02 DIAGNOSIS — N186 End stage renal disease: Secondary | ICD-10-CM | POA: Diagnosis not present

## 2018-01-02 DIAGNOSIS — Z7689 Persons encountering health services in other specified circumstances: Secondary | ICD-10-CM | POA: Diagnosis not present

## 2018-01-02 DIAGNOSIS — D631 Anemia in chronic kidney disease: Secondary | ICD-10-CM | POA: Diagnosis not present

## 2018-01-02 DIAGNOSIS — D689 Coagulation defect, unspecified: Secondary | ICD-10-CM | POA: Diagnosis not present

## 2018-01-02 DIAGNOSIS — N2581 Secondary hyperparathyroidism of renal origin: Secondary | ICD-10-CM | POA: Diagnosis not present

## 2018-01-02 DIAGNOSIS — E1129 Type 2 diabetes mellitus with other diabetic kidney complication: Secondary | ICD-10-CM | POA: Diagnosis not present

## 2018-01-03 DIAGNOSIS — D689 Coagulation defect, unspecified: Secondary | ICD-10-CM | POA: Diagnosis not present

## 2018-01-03 DIAGNOSIS — N186 End stage renal disease: Secondary | ICD-10-CM | POA: Diagnosis not present

## 2018-01-03 DIAGNOSIS — E1129 Type 2 diabetes mellitus with other diabetic kidney complication: Secondary | ICD-10-CM | POA: Diagnosis not present

## 2018-01-03 DIAGNOSIS — D631 Anemia in chronic kidney disease: Secondary | ICD-10-CM | POA: Diagnosis not present

## 2018-01-03 DIAGNOSIS — N2581 Secondary hyperparathyroidism of renal origin: Secondary | ICD-10-CM | POA: Diagnosis not present

## 2018-01-04 DIAGNOSIS — N2581 Secondary hyperparathyroidism of renal origin: Secondary | ICD-10-CM | POA: Diagnosis not present

## 2018-01-04 DIAGNOSIS — N186 End stage renal disease: Secondary | ICD-10-CM | POA: Diagnosis not present

## 2018-01-04 DIAGNOSIS — E1129 Type 2 diabetes mellitus with other diabetic kidney complication: Secondary | ICD-10-CM | POA: Diagnosis not present

## 2018-01-04 DIAGNOSIS — D689 Coagulation defect, unspecified: Secondary | ICD-10-CM | POA: Diagnosis not present

## 2018-01-04 DIAGNOSIS — Z7689 Persons encountering health services in other specified circumstances: Secondary | ICD-10-CM | POA: Diagnosis not present

## 2018-01-04 DIAGNOSIS — D631 Anemia in chronic kidney disease: Secondary | ICD-10-CM | POA: Diagnosis not present

## 2018-01-07 DIAGNOSIS — Z7689 Persons encountering health services in other specified circumstances: Secondary | ICD-10-CM | POA: Diagnosis not present

## 2018-01-07 DIAGNOSIS — Z992 Dependence on renal dialysis: Secondary | ICD-10-CM | POA: Diagnosis not present

## 2018-01-07 DIAGNOSIS — E1122 Type 2 diabetes mellitus with diabetic chronic kidney disease: Secondary | ICD-10-CM | POA: Diagnosis not present

## 2018-01-07 DIAGNOSIS — N186 End stage renal disease: Secondary | ICD-10-CM | POA: Diagnosis not present

## 2018-01-07 DIAGNOSIS — E1129 Type 2 diabetes mellitus with other diabetic kidney complication: Secondary | ICD-10-CM | POA: Diagnosis not present

## 2018-01-07 DIAGNOSIS — D631 Anemia in chronic kidney disease: Secondary | ICD-10-CM | POA: Diagnosis not present

## 2018-01-07 DIAGNOSIS — D689 Coagulation defect, unspecified: Secondary | ICD-10-CM | POA: Diagnosis not present

## 2018-01-07 DIAGNOSIS — N2581 Secondary hyperparathyroidism of renal origin: Secondary | ICD-10-CM | POA: Diagnosis not present

## 2018-01-09 DIAGNOSIS — N2581 Secondary hyperparathyroidism of renal origin: Secondary | ICD-10-CM | POA: Diagnosis not present

## 2018-01-09 DIAGNOSIS — Z7689 Persons encountering health services in other specified circumstances: Secondary | ICD-10-CM | POA: Diagnosis not present

## 2018-01-09 DIAGNOSIS — N186 End stage renal disease: Secondary | ICD-10-CM | POA: Diagnosis not present

## 2018-01-09 DIAGNOSIS — D689 Coagulation defect, unspecified: Secondary | ICD-10-CM | POA: Diagnosis not present

## 2018-01-09 DIAGNOSIS — D631 Anemia in chronic kidney disease: Secondary | ICD-10-CM | POA: Diagnosis not present

## 2018-01-11 DIAGNOSIS — N2581 Secondary hyperparathyroidism of renal origin: Secondary | ICD-10-CM | POA: Diagnosis not present

## 2018-01-11 DIAGNOSIS — Z7689 Persons encountering health services in other specified circumstances: Secondary | ICD-10-CM | POA: Diagnosis not present

## 2018-01-11 DIAGNOSIS — N186 End stage renal disease: Secondary | ICD-10-CM | POA: Diagnosis not present

## 2018-01-11 DIAGNOSIS — D631 Anemia in chronic kidney disease: Secondary | ICD-10-CM | POA: Diagnosis not present

## 2018-01-11 DIAGNOSIS — D689 Coagulation defect, unspecified: Secondary | ICD-10-CM | POA: Diagnosis not present

## 2018-01-13 DIAGNOSIS — E039 Hypothyroidism, unspecified: Secondary | ICD-10-CM | POA: Diagnosis not present

## 2018-01-13 DIAGNOSIS — Z7689 Persons encountering health services in other specified circumstances: Secondary | ICD-10-CM | POA: Diagnosis not present

## 2018-01-13 DIAGNOSIS — Z1389 Encounter for screening for other disorder: Secondary | ICD-10-CM | POA: Diagnosis not present

## 2018-01-14 DIAGNOSIS — D689 Coagulation defect, unspecified: Secondary | ICD-10-CM | POA: Diagnosis not present

## 2018-01-14 DIAGNOSIS — N186 End stage renal disease: Secondary | ICD-10-CM | POA: Diagnosis not present

## 2018-01-14 DIAGNOSIS — N2581 Secondary hyperparathyroidism of renal origin: Secondary | ICD-10-CM | POA: Diagnosis not present

## 2018-01-14 DIAGNOSIS — D631 Anemia in chronic kidney disease: Secondary | ICD-10-CM | POA: Diagnosis not present

## 2018-01-16 DIAGNOSIS — N186 End stage renal disease: Secondary | ICD-10-CM | POA: Diagnosis not present

## 2018-01-16 DIAGNOSIS — Z7689 Persons encountering health services in other specified circumstances: Secondary | ICD-10-CM | POA: Diagnosis not present

## 2018-01-16 DIAGNOSIS — D631 Anemia in chronic kidney disease: Secondary | ICD-10-CM | POA: Diagnosis not present

## 2018-01-16 DIAGNOSIS — D689 Coagulation defect, unspecified: Secondary | ICD-10-CM | POA: Diagnosis not present

## 2018-01-16 DIAGNOSIS — N2581 Secondary hyperparathyroidism of renal origin: Secondary | ICD-10-CM | POA: Diagnosis not present

## 2018-01-17 ENCOUNTER — Ambulatory Visit: Payer: Medicare Other | Admitting: Obstetrics & Gynecology

## 2018-01-17 DIAGNOSIS — N186 End stage renal disease: Secondary | ICD-10-CM | POA: Diagnosis not present

## 2018-01-17 DIAGNOSIS — E1122 Type 2 diabetes mellitus with diabetic chronic kidney disease: Secondary | ICD-10-CM | POA: Diagnosis not present

## 2018-01-17 DIAGNOSIS — Z992 Dependence on renal dialysis: Secondary | ICD-10-CM | POA: Diagnosis not present

## 2018-01-18 DIAGNOSIS — D631 Anemia in chronic kidney disease: Secondary | ICD-10-CM | POA: Diagnosis not present

## 2018-01-18 DIAGNOSIS — Z7689 Persons encountering health services in other specified circumstances: Secondary | ICD-10-CM | POA: Diagnosis not present

## 2018-01-18 DIAGNOSIS — N2581 Secondary hyperparathyroidism of renal origin: Secondary | ICD-10-CM | POA: Diagnosis not present

## 2018-01-18 DIAGNOSIS — D689 Coagulation defect, unspecified: Secondary | ICD-10-CM | POA: Diagnosis not present

## 2018-01-18 DIAGNOSIS — N186 End stage renal disease: Secondary | ICD-10-CM | POA: Diagnosis not present

## 2018-01-24 ENCOUNTER — Ambulatory Visit: Payer: Medicare Other | Admitting: Obstetrics & Gynecology

## 2018-02-08 DEATH — deceased

## 2018-05-16 ENCOUNTER — Encounter (INDEPENDENT_AMBULATORY_CARE_PROVIDER_SITE_OTHER): Payer: Medicaid Other | Admitting: Ophthalmology

## 2019-05-20 IMAGING — US US CAROTID DUPLEX BILAT
1 series · 13 of 24 positions shown · non-contrast
Comparison: 06/03/2012

CLINICAL DATA: Asymptomatic left carotid bruit

EXAM:
BILATERAL CAROTID DUPLEX ULTRASOUND
TECHNIQUE: Gray scale imaging, color Doppler and duplex ultrasound were
performed of bilateral carotid and vertebral arteries in the neck.

[Series 1: us carotid duplex bilat · 0.06mm/px · 13 of 68 slices shown]
[im 1/68]
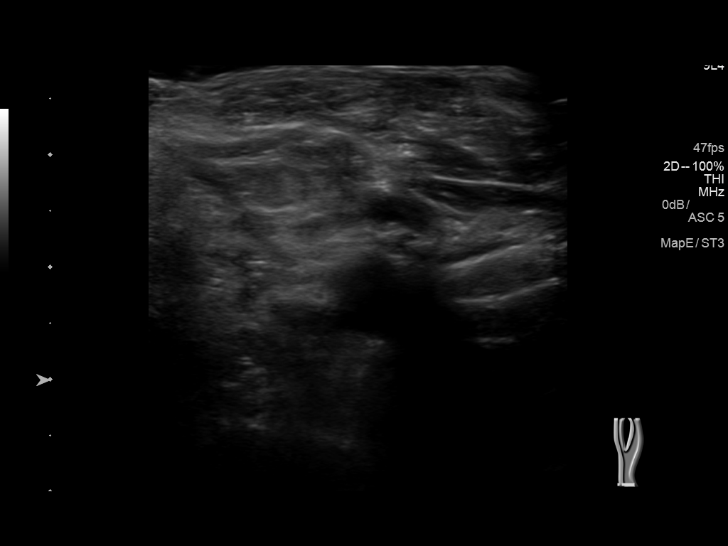
[im 6/68]
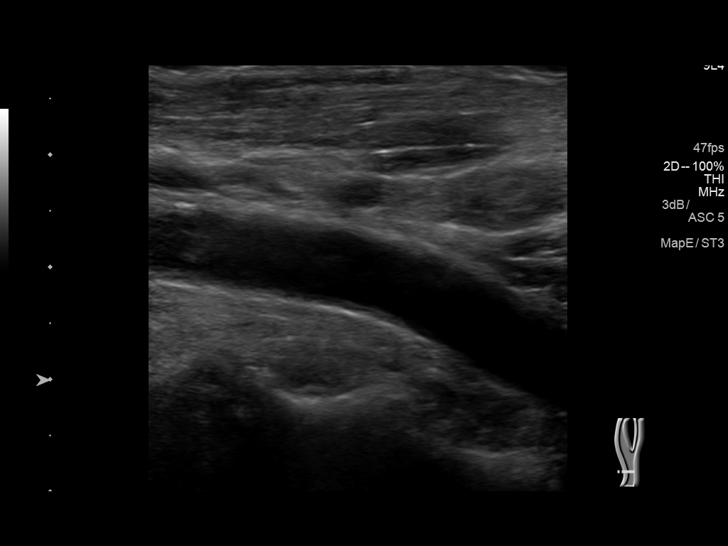
[im 12/68]
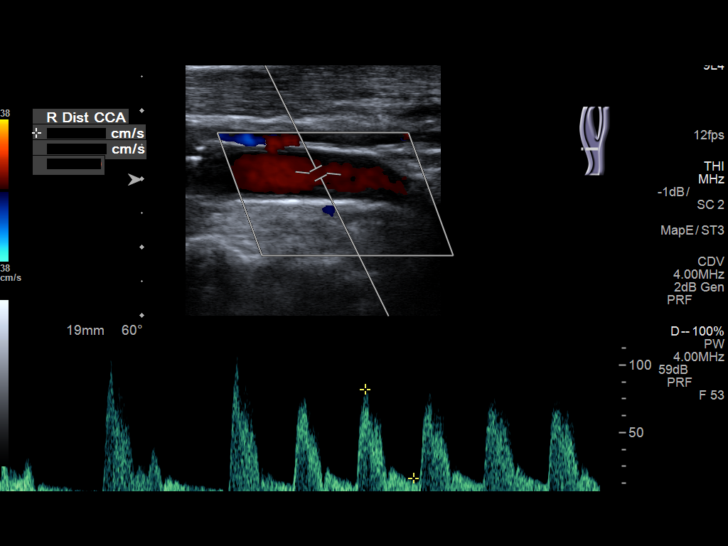
[im 18/68]
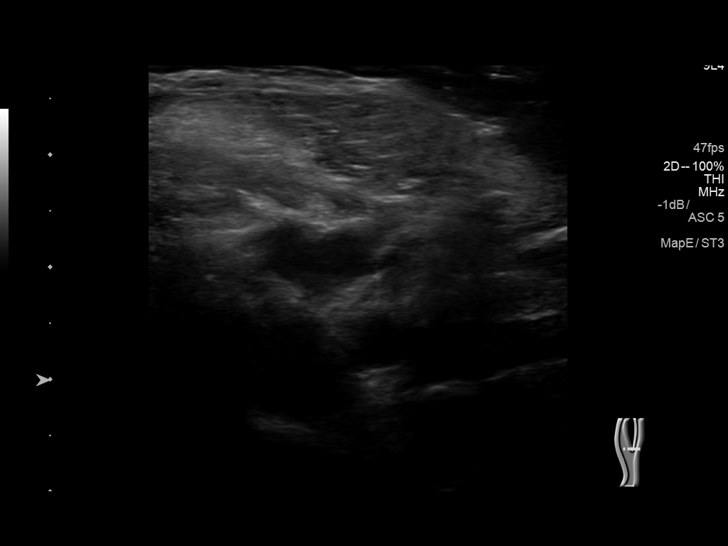
[im 24/68]
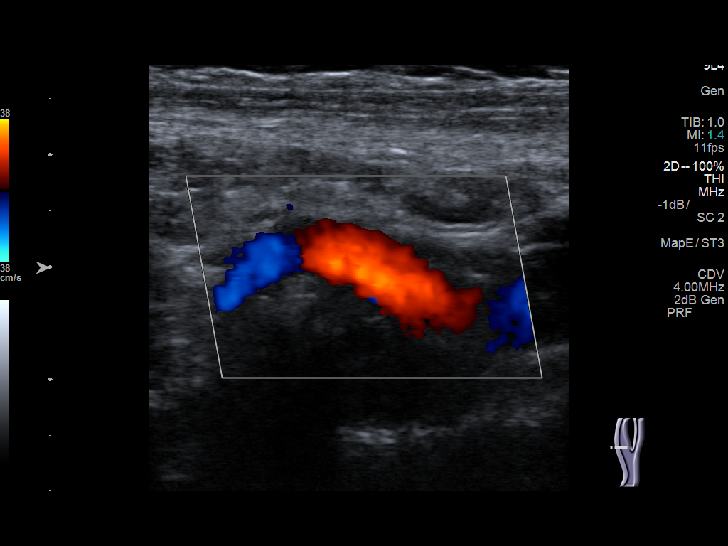
[im 30/68]
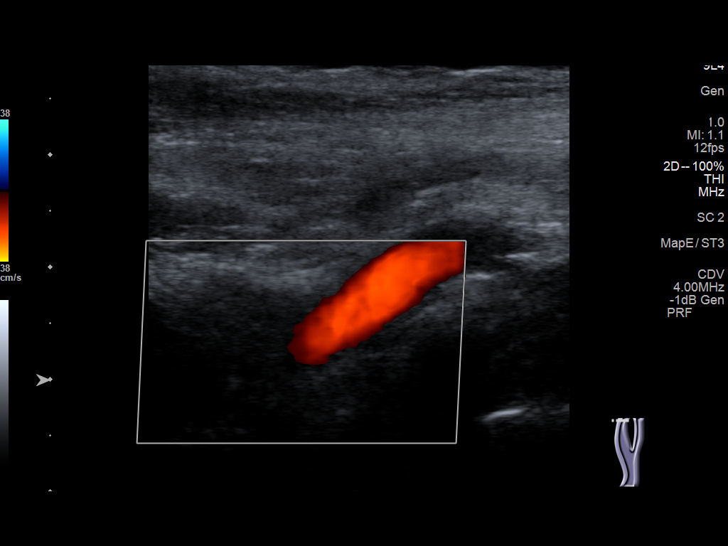
[im 35/68]
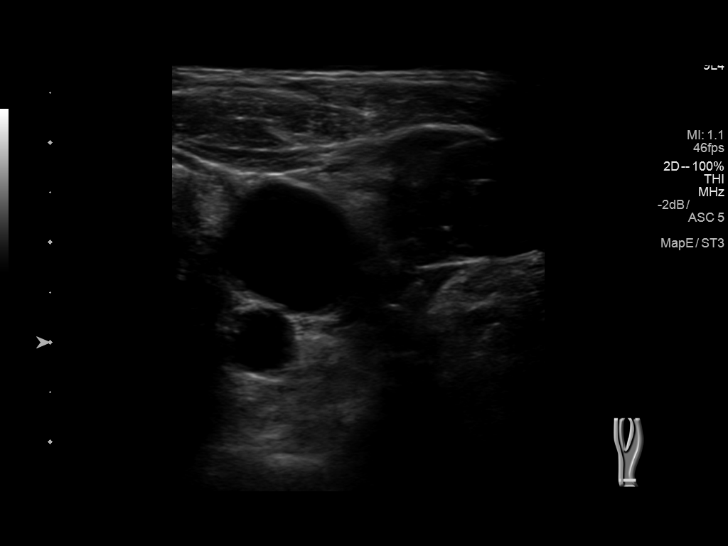
[im 38/68]
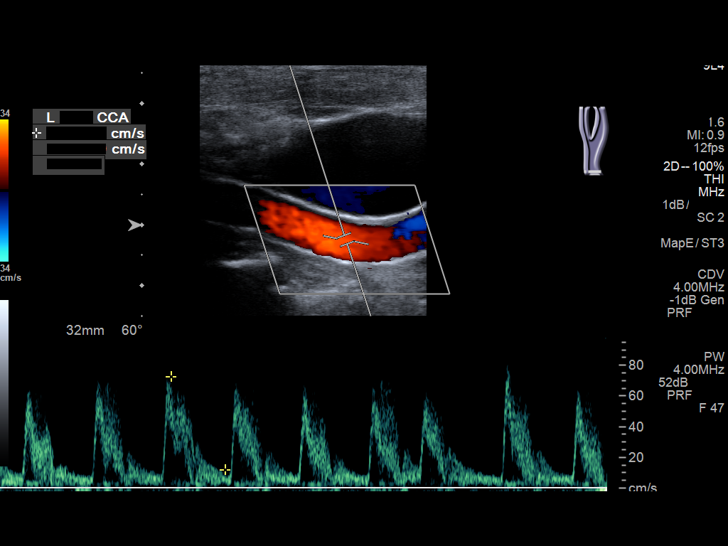
[im 44/68]
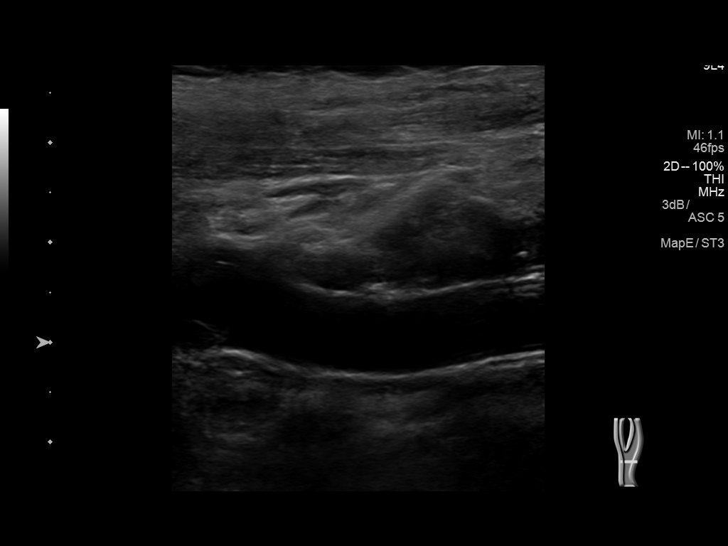
[im 50/68]
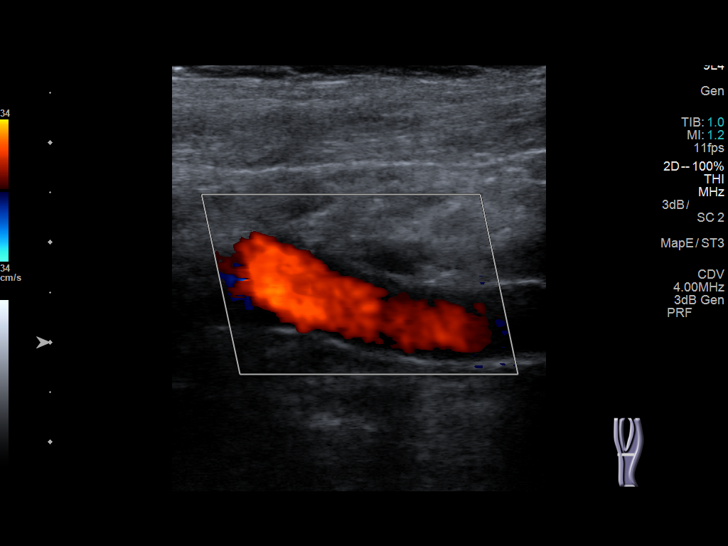
[im 56/68]
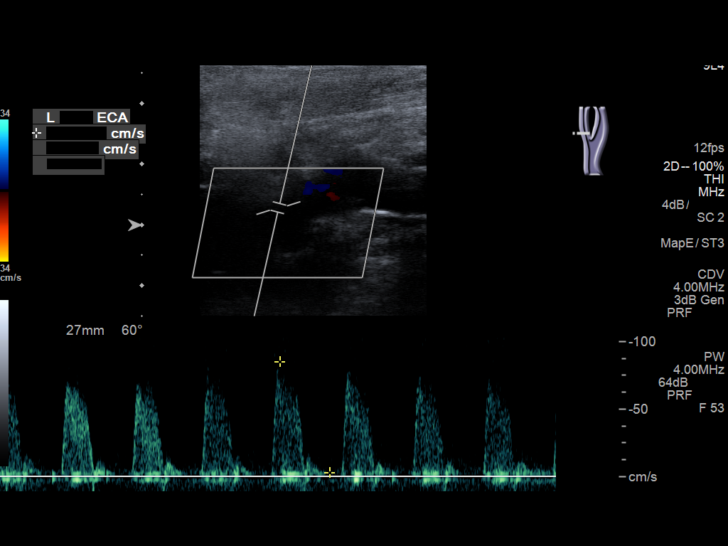
[im 62/68]
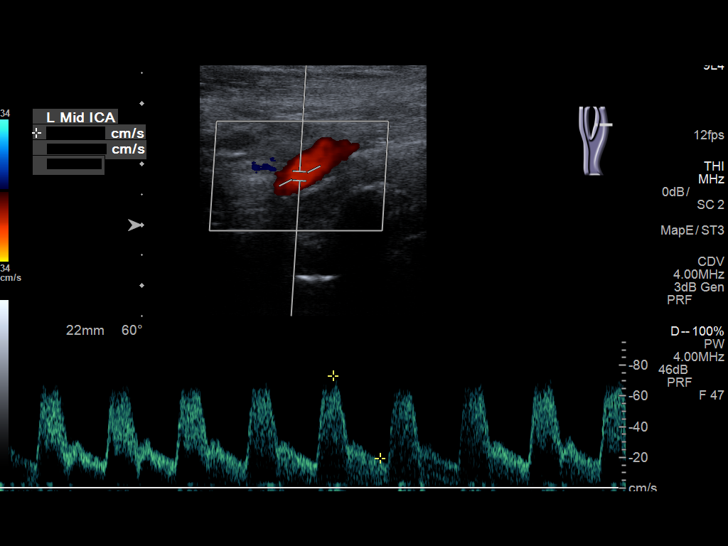
[im 68/68]
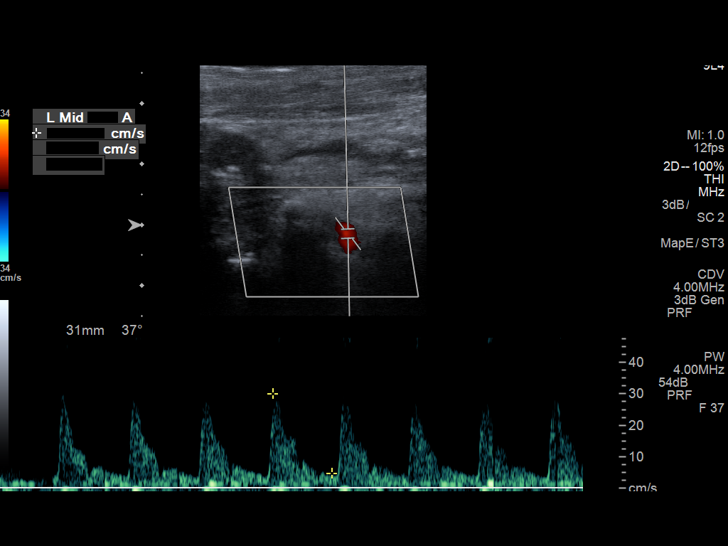

[13 of 24 positions shown; findings below may reference images not displayed]

FINDINGS: Criteria: Quantification of carotid stenosis is based on velocity
parameters that correlate the residual internal carotid diameter
with NASCET-based stenosis levels, using the diameter of the distal
internal carotid lumen as the denominator for stenosis measurement.

The following velocity measurements were obtained:

RIGHT

ICA:  107/26 cm/sec

CCA:  98/15 cm/sec

SYSTOLIC ICA/CCA RATIO:

DIASTOLIC ICA/CCA RATIO:

ECA:  100 cm/sec

LEFT

ICA:  73/23 cm/sec

CCA:  79/10 cm/sec

SYSTOLIC ICA/CCA RATIO:

DIASTOLIC ICA/CCA RATIO:

ECA:  85 cm/sec

RIGHT CAROTID ARTERY: Minor echogenic shadowing plaque formation. No
hemodynamically significant right ICA stenosis, velocity elevation,
or turbulent flow. Degree of narrowing less than 50%.

RIGHT VERTEBRAL ARTERY:  Antegrade

LEFT CAROTID ARTERY: Similar scattered minor echogenic plaque
formation. No hemodynamically significant left ICA stenosis,
velocity elevation, or turbulent flow.

LEFT VERTEBRAL ARTERY:  Antegrade
IMPRESSION: Mild bilateral carotid atherosclerosis. No hemodynamically
significant ICA stenosis. Degree of narrowing less than 50%
bilaterally.

Patent antegrade vertebral flow
